# Patient Record
Sex: Male | Born: 1935 | Race: White | Hispanic: No | Marital: Married | Smoking: Former smoker
Health system: Southern US, Community
[De-identification: ages and names within clinical notes are randomized; demographics above are authoritative.]

## PROBLEM LIST (undated history)

## (undated) DIAGNOSIS — I10 Essential (primary) hypertension: Secondary | ICD-10-CM

## (undated) DIAGNOSIS — N1330 Unspecified hydronephrosis: Secondary | ICD-10-CM

## (undated) DIAGNOSIS — N183 Chronic kidney disease, stage 3 unspecified: Secondary | ICD-10-CM

## (undated) DIAGNOSIS — R519 Headache, unspecified: Secondary | ICD-10-CM

## (undated) DIAGNOSIS — N400 Enlarged prostate without lower urinary tract symptoms: Secondary | ICD-10-CM

## (undated) DIAGNOSIS — M1990 Unspecified osteoarthritis, unspecified site: Secondary | ICD-10-CM

## (undated) DIAGNOSIS — Z992 Dependence on renal dialysis: Secondary | ICD-10-CM

## (undated) DIAGNOSIS — R51 Headache: Secondary | ICD-10-CM

## (undated) DIAGNOSIS — I639 Cerebral infarction, unspecified: Secondary | ICD-10-CM

## (undated) DIAGNOSIS — I219 Acute myocardial infarction, unspecified: Secondary | ICD-10-CM

## (undated) DIAGNOSIS — N134 Hydroureter: Secondary | ICD-10-CM

## (undated) DIAGNOSIS — I2510 Atherosclerotic heart disease of native coronary artery without angina pectoris: Secondary | ICD-10-CM

## (undated) DIAGNOSIS — M549 Dorsalgia, unspecified: Secondary | ICD-10-CM

## (undated) DIAGNOSIS — C679 Malignant neoplasm of bladder, unspecified: Secondary | ICD-10-CM

## (undated) DIAGNOSIS — G8929 Other chronic pain: Secondary | ICD-10-CM

## (undated) DIAGNOSIS — N186 End stage renal disease: Secondary | ICD-10-CM

## (undated) DIAGNOSIS — E785 Hyperlipidemia, unspecified: Secondary | ICD-10-CM

## (undated) HISTORY — PX: MULTIPLE TOOTH EXTRACTIONS: SHX2053

## (undated) HISTORY — DX: Hyperlipidemia, unspecified: E78.5

## (undated) HISTORY — PX: TRANSURETHRAL RESECTION OF BLADDER TUMOR: SHX2575

## (undated) HISTORY — DX: Unspecified osteoarthritis, unspecified site: M19.90

## (undated) HISTORY — PX: CHOLECYSTECTOMY: SHX55

## (undated) HISTORY — PX: TONSILLECTOMY: SUR1361

## (undated) HISTORY — DX: Essential (primary) hypertension: I10

---

## 1995-05-26 HISTORY — PX: CORONARY ANGIOPLASTY: SHX604

## 2001-09-26 ENCOUNTER — Emergency Department (HOSPITAL_COMMUNITY): Admission: EM | Admit: 2001-09-26 | Discharge: 2001-09-26 | Payer: Self-pay | Admitting: Emergency Medicine

## 2001-09-26 ENCOUNTER — Encounter: Payer: Self-pay | Admitting: *Deleted

## 2001-10-29 ENCOUNTER — Emergency Department (HOSPITAL_COMMUNITY): Admission: EM | Admit: 2001-10-29 | Discharge: 2001-10-29 | Payer: Self-pay | Admitting: Emergency Medicine

## 2001-11-02 ENCOUNTER — Inpatient Hospital Stay (HOSPITAL_COMMUNITY): Admission: RE | Admit: 2001-11-02 | Discharge: 2001-11-04 | Payer: Self-pay | Admitting: Urology

## 2001-11-08 ENCOUNTER — Emergency Department (HOSPITAL_COMMUNITY): Admission: EM | Admit: 2001-11-08 | Discharge: 2001-11-08 | Payer: Self-pay | Admitting: Emergency Medicine

## 2002-03-10 ENCOUNTER — Ambulatory Visit (HOSPITAL_COMMUNITY): Admission: RE | Admit: 2002-03-10 | Discharge: 2002-03-10 | Payer: Self-pay

## 2002-06-08 ENCOUNTER — Ambulatory Visit (HOSPITAL_COMMUNITY): Admission: RE | Admit: 2002-06-08 | Discharge: 2002-06-08 | Payer: Self-pay | Admitting: Internal Medicine

## 2002-06-08 HISTORY — PX: COLONOSCOPY: SHX174

## 2002-06-08 HISTORY — PX: ESOPHAGOGASTRODUODENOSCOPY: SHX1529

## 2005-04-07 ENCOUNTER — Encounter (INDEPENDENT_AMBULATORY_CARE_PROVIDER_SITE_OTHER): Payer: Self-pay | Admitting: Urology

## 2005-04-07 ENCOUNTER — Inpatient Hospital Stay (HOSPITAL_COMMUNITY): Admission: RE | Admit: 2005-04-07 | Discharge: 2005-04-13 | Payer: Self-pay | Admitting: Urology

## 2005-05-01 ENCOUNTER — Encounter (INDEPENDENT_AMBULATORY_CARE_PROVIDER_SITE_OTHER): Payer: Self-pay | Admitting: Urology

## 2005-05-02 ENCOUNTER — Inpatient Hospital Stay (HOSPITAL_COMMUNITY): Admission: RE | Admit: 2005-05-02 | Discharge: 2005-05-03 | Payer: Self-pay | Admitting: Urology

## 2005-08-14 ENCOUNTER — Encounter (INDEPENDENT_AMBULATORY_CARE_PROVIDER_SITE_OTHER): Payer: Self-pay | Admitting: Specialist

## 2005-08-14 ENCOUNTER — Observation Stay (HOSPITAL_COMMUNITY): Admission: RE | Admit: 2005-08-14 | Discharge: 2005-08-15 | Payer: Self-pay | Admitting: Urology

## 2006-02-24 ENCOUNTER — Ambulatory Visit (HOSPITAL_COMMUNITY): Admission: RE | Admit: 2006-02-24 | Discharge: 2006-02-24 | Payer: Self-pay | Admitting: Urology

## 2006-03-09 ENCOUNTER — Encounter (INDEPENDENT_AMBULATORY_CARE_PROVIDER_SITE_OTHER): Payer: Self-pay | Admitting: *Deleted

## 2006-03-09 ENCOUNTER — Inpatient Hospital Stay (HOSPITAL_COMMUNITY): Admission: RE | Admit: 2006-03-09 | Discharge: 2006-03-10 | Payer: Self-pay | Admitting: Urology

## 2006-03-19 ENCOUNTER — Ambulatory Visit (HOSPITAL_COMMUNITY): Admission: RE | Admit: 2006-03-19 | Discharge: 2006-03-19 | Payer: Self-pay | Admitting: Urology

## 2006-11-29 ENCOUNTER — Ambulatory Visit (HOSPITAL_COMMUNITY): Admission: RE | Admit: 2006-11-29 | Discharge: 2006-11-29 | Payer: Self-pay | Admitting: Urology

## 2007-08-16 ENCOUNTER — Observation Stay (HOSPITAL_COMMUNITY): Admission: RE | Admit: 2007-08-16 | Discharge: 2007-08-17 | Payer: Self-pay | Admitting: Urology

## 2007-08-16 ENCOUNTER — Encounter (INDEPENDENT_AMBULATORY_CARE_PROVIDER_SITE_OTHER): Payer: Self-pay | Admitting: Urology

## 2007-08-17 ENCOUNTER — Emergency Department (HOSPITAL_COMMUNITY): Admission: EM | Admit: 2007-08-17 | Discharge: 2007-08-17 | Payer: Self-pay | Admitting: Emergency Medicine

## 2007-09-12 ENCOUNTER — Ambulatory Visit (HOSPITAL_COMMUNITY): Admission: RE | Admit: 2007-09-12 | Discharge: 2007-09-12 | Payer: Self-pay | Admitting: Family Medicine

## 2007-09-14 ENCOUNTER — Ambulatory Visit (HOSPITAL_COMMUNITY): Admission: RE | Admit: 2007-09-14 | Discharge: 2007-09-14 | Payer: Self-pay | Admitting: Family Medicine

## 2007-09-20 ENCOUNTER — Ambulatory Visit: Payer: Self-pay | Admitting: Cardiovascular Disease

## 2007-09-30 ENCOUNTER — Ambulatory Visit: Payer: Self-pay | Admitting: Internal Medicine

## 2007-09-30 ENCOUNTER — Encounter (HOSPITAL_COMMUNITY): Admission: RE | Admit: 2007-09-30 | Discharge: 2007-10-30 | Payer: Self-pay | Admitting: Cardiovascular Disease

## 2007-09-30 ENCOUNTER — Encounter: Payer: Self-pay | Admitting: Cardiovascular Disease

## 2007-10-06 ENCOUNTER — Ambulatory Visit: Payer: Self-pay | Admitting: Cardiovascular Disease

## 2007-11-15 ENCOUNTER — Ambulatory Visit (HOSPITAL_COMMUNITY): Admission: RE | Admit: 2007-11-15 | Discharge: 2007-11-15 | Payer: Self-pay | Admitting: Family Medicine

## 2008-01-02 ENCOUNTER — Ambulatory Visit: Payer: Self-pay | Admitting: Cardiology

## 2008-01-03 ENCOUNTER — Ambulatory Visit (HOSPITAL_COMMUNITY): Admission: RE | Admit: 2008-01-03 | Discharge: 2008-01-03 | Payer: Self-pay | Admitting: Cardiology

## 2008-06-18 ENCOUNTER — Ambulatory Visit (HOSPITAL_COMMUNITY): Admission: RE | Admit: 2008-06-18 | Discharge: 2008-06-18 | Payer: Self-pay | Admitting: Urology

## 2008-06-22 ENCOUNTER — Ambulatory Visit (HOSPITAL_COMMUNITY): Admission: RE | Admit: 2008-06-22 | Discharge: 2008-06-22 | Payer: Self-pay | Admitting: Urology

## 2008-07-10 ENCOUNTER — Ambulatory Visit (HOSPITAL_COMMUNITY): Admission: RE | Admit: 2008-07-10 | Discharge: 2008-07-10 | Payer: Self-pay | Admitting: Urology

## 2008-07-20 ENCOUNTER — Ambulatory Visit (HOSPITAL_COMMUNITY): Admission: RE | Admit: 2008-07-20 | Discharge: 2008-07-20 | Payer: Self-pay | Admitting: Urology

## 2008-07-21 ENCOUNTER — Emergency Department (HOSPITAL_COMMUNITY): Admission: EM | Admit: 2008-07-21 | Discharge: 2008-07-21 | Payer: Self-pay | Admitting: Emergency Medicine

## 2008-07-27 ENCOUNTER — Ambulatory Visit (HOSPITAL_COMMUNITY): Admission: RE | Admit: 2008-07-27 | Discharge: 2008-07-27 | Payer: Self-pay | Admitting: Urology

## 2008-07-27 ENCOUNTER — Encounter (INDEPENDENT_AMBULATORY_CARE_PROVIDER_SITE_OTHER): Payer: Self-pay | Admitting: Urology

## 2008-10-26 ENCOUNTER — Ambulatory Visit (HOSPITAL_COMMUNITY): Admission: RE | Admit: 2008-10-26 | Discharge: 2008-10-26 | Payer: Self-pay | Admitting: Urology

## 2008-11-12 ENCOUNTER — Ambulatory Visit (HOSPITAL_COMMUNITY): Admission: RE | Admit: 2008-11-12 | Discharge: 2008-11-12 | Payer: Self-pay | Admitting: Urology

## 2009-01-14 ENCOUNTER — Ambulatory Visit: Payer: Self-pay | Admitting: Cardiology

## 2009-01-14 ENCOUNTER — Telehealth (INDEPENDENT_AMBULATORY_CARE_PROVIDER_SITE_OTHER): Payer: Self-pay

## 2009-01-14 ENCOUNTER — Observation Stay (HOSPITAL_COMMUNITY): Admission: EM | Admit: 2009-01-14 | Discharge: 2009-01-15 | Payer: Self-pay | Admitting: Emergency Medicine

## 2009-01-14 ENCOUNTER — Encounter (INDEPENDENT_AMBULATORY_CARE_PROVIDER_SITE_OTHER): Payer: Self-pay

## 2009-01-14 LAB — CONVERTED CEMR LAB
BUN: 26 mg/dL
CK-MB: 1.7 ng/mL
Calcium: 9.5 mg/dL
Chloride: 105 meq/L
Hgb A1c MFr Bld: 7.7 %
MCV: 87.3 fL
Platelets: 230 10*3/uL
Potassium: 4.7 meq/L
Troponin I: <0.05 ng/mL

## 2009-01-15 ENCOUNTER — Encounter (INDEPENDENT_AMBULATORY_CARE_PROVIDER_SITE_OTHER): Payer: Self-pay

## 2009-01-15 LAB — CONVERTED CEMR LAB
AST: 19 units/L
Albumin: 3.6 g/dL
Alkaline Phosphatase: 59 units/L
CO2: 29 meq/L
Chloride: 110 meq/L
Hemoglobin: 12.6 g/dL
LDL Cholesterol: 45 mg/dL
Potassium: 4 meq/L
Sodium: 142 meq/L
Total Protein: 6.8 g/dL
WBC: 6.4 10*3/uL

## 2009-01-24 DIAGNOSIS — I10 Essential (primary) hypertension: Secondary | ICD-10-CM

## 2009-01-24 DIAGNOSIS — E785 Hyperlipidemia, unspecified: Secondary | ICD-10-CM

## 2009-01-24 DIAGNOSIS — M1990 Unspecified osteoarthritis, unspecified site: Secondary | ICD-10-CM | POA: Insufficient documentation

## 2009-01-24 DIAGNOSIS — R0602 Shortness of breath: Secondary | ICD-10-CM | POA: Insufficient documentation

## 2009-01-25 ENCOUNTER — Encounter (INDEPENDENT_AMBULATORY_CARE_PROVIDER_SITE_OTHER): Payer: Self-pay

## 2009-01-25 ENCOUNTER — Ambulatory Visit: Payer: Self-pay | Admitting: Cardiology

## 2009-01-25 DIAGNOSIS — C679 Malignant neoplasm of bladder, unspecified: Secondary | ICD-10-CM

## 2009-01-25 DIAGNOSIS — Z87442 Personal history of urinary calculi: Secondary | ICD-10-CM | POA: Insufficient documentation

## 2009-01-25 DIAGNOSIS — I2510 Atherosclerotic heart disease of native coronary artery without angina pectoris: Secondary | ICD-10-CM | POA: Insufficient documentation

## 2009-01-31 ENCOUNTER — Encounter: Payer: Self-pay | Admitting: Cardiology

## 2009-02-04 ENCOUNTER — Encounter (INDEPENDENT_AMBULATORY_CARE_PROVIDER_SITE_OTHER): Payer: Self-pay | Admitting: *Deleted

## 2009-03-08 ENCOUNTER — Ambulatory Visit (HOSPITAL_COMMUNITY): Admission: RE | Admit: 2009-03-08 | Discharge: 2009-03-08 | Payer: Self-pay | Admitting: Urology

## 2009-03-20 ENCOUNTER — Ambulatory Visit (HOSPITAL_COMMUNITY): Admission: RE | Admit: 2009-03-20 | Discharge: 2009-03-20 | Payer: Self-pay | Admitting: Internal Medicine

## 2009-04-02 ENCOUNTER — Ambulatory Visit (HOSPITAL_COMMUNITY): Admission: RE | Admit: 2009-04-02 | Discharge: 2009-04-02 | Payer: Self-pay | Admitting: Urology

## 2009-04-23 ENCOUNTER — Ambulatory Visit (HOSPITAL_COMMUNITY): Admission: RE | Admit: 2009-04-23 | Discharge: 2009-04-23 | Payer: Self-pay | Admitting: Urology

## 2009-07-08 ENCOUNTER — Ambulatory Visit (HOSPITAL_COMMUNITY): Admission: RE | Admit: 2009-07-08 | Discharge: 2009-07-08 | Payer: Self-pay | Admitting: Urology

## 2009-07-23 ENCOUNTER — Emergency Department (HOSPITAL_COMMUNITY): Admission: EM | Admit: 2009-07-23 | Discharge: 2009-07-23 | Payer: Self-pay | Admitting: Emergency Medicine

## 2009-08-16 ENCOUNTER — Ambulatory Visit (HOSPITAL_COMMUNITY): Admission: RE | Admit: 2009-08-16 | Discharge: 2009-08-16 | Payer: Self-pay | Admitting: Urology

## 2009-08-20 ENCOUNTER — Ambulatory Visit (HOSPITAL_COMMUNITY): Admission: RE | Admit: 2009-08-20 | Discharge: 2009-08-20 | Payer: Self-pay | Admitting: Urology

## 2009-08-27 ENCOUNTER — Inpatient Hospital Stay (HOSPITAL_COMMUNITY): Admission: EM | Admit: 2009-08-27 | Discharge: 2009-08-30 | Payer: Self-pay | Admitting: Emergency Medicine

## 2009-10-01 ENCOUNTER — Inpatient Hospital Stay (HOSPITAL_COMMUNITY): Admission: RE | Admit: 2009-10-01 | Discharge: 2009-10-03 | Payer: Self-pay | Admitting: Urology

## 2009-10-01 ENCOUNTER — Encounter (INDEPENDENT_AMBULATORY_CARE_PROVIDER_SITE_OTHER): Payer: Self-pay | Admitting: Urology

## 2009-10-08 ENCOUNTER — Encounter (INDEPENDENT_AMBULATORY_CARE_PROVIDER_SITE_OTHER): Payer: Self-pay | Admitting: *Deleted

## 2009-11-26 ENCOUNTER — Ambulatory Visit (HOSPITAL_COMMUNITY): Admission: RE | Admit: 2009-11-26 | Discharge: 2009-11-26 | Payer: Self-pay | Admitting: Urology

## 2010-01-02 ENCOUNTER — Ambulatory Visit (HOSPITAL_COMMUNITY): Admission: RE | Admit: 2010-01-02 | Discharge: 2010-01-02 | Payer: Self-pay | Admitting: Urology

## 2010-02-26 ENCOUNTER — Encounter (INDEPENDENT_AMBULATORY_CARE_PROVIDER_SITE_OTHER): Payer: Self-pay | Admitting: *Deleted

## 2010-06-15 ENCOUNTER — Encounter: Payer: Self-pay | Admitting: Internal Medicine

## 2010-06-15 ENCOUNTER — Encounter: Payer: Self-pay | Admitting: Diagnostic Radiology

## 2010-06-24 NOTE — Letter (Signed)
Summary: Appointment - Reminder 2  [facility] [place]. [address], [place] [address]   Phone: [phone]  Fax: [phone]     February 26, 2010 MRN: [id number]   Craig Castillo [address] [place], [place]  [address]   Dear [name],  Our records indicate that it is time to schedule a follow-up appointment.  Dr.  Dietrich Pates        recommended that you follow up with [name] in    07/2009        . It is very important that we reach you to schedule this appointment. We look forward to participating in your health care needs. Please contact us at the number listed above at your earliest convenience to schedule your appointment.  If you are unable to make an appointment at this time, give [name] a call so we can update our records.     Sincerely,   Glass blower/designer

## 2010-06-24 NOTE — Letter (Signed)
Summary: Appointment - Reminder 2  [facility] [place]. [address] [place] [address]   Phone: [phone]  Fax: [phone]     Oct 08, 2009 MRN: [id number]   Craig Castillo [address] [place], [place]  [address]   Dear [name],  Our records indicate that it is time to schedule a follow-up appointment.  Dr.   Dietrich Pates       recommended that you follow up with [name] in      07/2009      . It is very important that we reach you to schedule this appointment. We look forward to participating in your health care needs. Please contact us at the number listed above at your earliest convenience to schedule your appointment.  If you are unable to make an appointment at this time, give [name] a call so we can update our records.     Sincerely,   Glass blower/designer

## 2010-07-08 ENCOUNTER — Other Ambulatory Visit (HOSPITAL_COMMUNITY): Payer: Self-pay | Admitting: Urology

## 2010-07-08 DIAGNOSIS — N200 Calculus of kidney: Secondary | ICD-10-CM

## 2010-07-11 ENCOUNTER — Ambulatory Visit (HOSPITAL_COMMUNITY)
Admission: RE | Admit: 2010-07-11 | Discharge: 2010-07-11 | Disposition: A | Discharge status: Home / Self Care | Payer: PRIVATE HEALTH INSURANCE | Source: Ambulatory Visit | Attending: Urology | Admitting: Urology

## 2010-07-11 DIAGNOSIS — N200 Calculus of kidney: Secondary | ICD-10-CM

## 2010-07-11 DIAGNOSIS — R109 Unspecified abdominal pain: Secondary | ICD-10-CM | POA: Insufficient documentation

## 2010-07-11 DIAGNOSIS — N1330 Unspecified hydronephrosis: Secondary | ICD-10-CM | POA: Insufficient documentation

## 2010-08-12 LAB — COMPREHENSIVE METABOLIC PANEL
ALT: 10 U/L (ref 0–53)
AST: 14 U/L (ref 0–37)
Calcium: 8.8 mg/dL (ref 8.4–10.5)
Creatinine, Ser: 2.42 mg/dL — ABNORMAL HIGH (ref 0.4–1.5)
GFR calc Af Amer: 32 mL/min — ABNORMAL LOW (ref >60)
Sodium: 138 mEq/L (ref 135–145)
Total Protein: 6.6 g/dL (ref 6.0–8.3)

## 2010-08-12 LAB — RENAL FUNCTION PANEL
Albumin: 3 g/dL — ABNORMAL LOW (ref 3.5–5.2)
Calcium: 9 mg/dL (ref 8.4–10.5)
GFR calc Af Amer: 25 mL/min — ABNORMAL LOW (ref >60)
GFR calc non Af Amer: 21 mL/min — ABNORMAL LOW (ref >60)
Glucose, Bld: 227 mg/dL — ABNORMAL HIGH (ref 70–99)
Phosphorus: 2.6 mg/dL (ref 2.3–4.6)
Potassium: 3.8 mEq/L (ref 3.5–5.1)
Sodium: 137 mEq/L (ref 135–145)

## 2010-08-12 LAB — BASIC METABOLIC PANEL
BUN: 31 mg/dL — ABNORMAL HIGH (ref 6–23)
BUN: 37 mg/dL — ABNORMAL HIGH (ref 6–23)
CO2: 23 mEq/L (ref 19–32)
CO2: 26 mEq/L (ref 19–32)
Calcium: 9.1 mg/dL (ref 8.4–10.5)
Calcium: 9.5 mg/dL (ref 8.4–10.5)
Chloride: 107 mEq/L (ref 96–112)
Creatinine, Ser: 2.96 mg/dL — ABNORMAL HIGH (ref 0.4–1.5)
GFR calc Af Amer: 25 mL/min — ABNORMAL LOW (ref >60)
GFR calc Af Amer: 35 mL/min — ABNORMAL LOW (ref >60)
GFR calc non Af Amer: 29 mL/min — ABNORMAL LOW (ref >60)
GFR calc non Af Amer: 29 mL/min — ABNORMAL LOW (ref >60)
Glucose, Bld: 184 mg/dL — ABNORMAL HIGH (ref 70–99)
Glucose, Bld: 221 mg/dL — ABNORMAL HIGH (ref 70–99)
Potassium: 3.9 mEq/L (ref 3.5–5.1)
Potassium: 4.8 mEq/L (ref 3.5–5.1)
Sodium: 138 mEq/L (ref 135–145)

## 2010-08-12 LAB — GLUCOSE, CAPILLARY
Glucose-Capillary: 183 mg/dL — ABNORMAL HIGH (ref 70–99)
Glucose-Capillary: 208 mg/dL — ABNORMAL HIGH (ref 70–99)
Glucose-Capillary: 213 mg/dL — ABNORMAL HIGH (ref 70–99)
Glucose-Capillary: 216 mg/dL — ABNORMAL HIGH (ref 70–99)
Glucose-Capillary: 230 mg/dL — ABNORMAL HIGH (ref 70–99)
Glucose-Capillary: 245 mg/dL — ABNORMAL HIGH (ref 70–99)

## 2010-08-12 LAB — URINE CULTURE

## 2010-08-12 LAB — DIFFERENTIAL
Basophils Absolute: 0 10*3/uL (ref 0.0–0.1)
Basophils Absolute: 0 10*3/uL (ref 0.0–0.1)
Basophils Relative: 0 % (ref 0–1)
Eosinophils Absolute: 0.2 10*3/uL (ref 0.0–0.7)
Eosinophils Absolute: 0.2 10*3/uL (ref 0.0–0.7)
Eosinophils Relative: 2 % (ref 0–5)
Eosinophils Relative: 2 % (ref 0–5)
Lymphocytes Relative: 7 % — ABNORMAL LOW (ref 12–46)
Lymphocytes Relative: 9 % — ABNORMAL LOW (ref 12–46)
Lymphs Abs: 0.5 10*3/uL — ABNORMAL LOW (ref 0.7–4.0)
Lymphs Abs: 0.9 10*3/uL (ref 0.7–4.0)
Monocytes Absolute: 0 10*3/uL — ABNORMAL LOW (ref 0.1–1.0)
Monocytes Relative: 0 % — ABNORMAL LOW (ref 3–12)
Monocytes Relative: 8 % (ref 3–12)
Monocytes Relative: 9 % (ref 3–12)
Neutro Abs: 6.7 10*3/uL (ref 1.7–7.7)
Neutro Abs: 8.4 10*3/uL — ABNORMAL HIGH (ref 1.7–7.7)
Neutrophils Relative %: 80 % — ABNORMAL HIGH (ref 43–77)

## 2010-08-12 LAB — CARDIAC PANEL(CRET KIN+CKTOT+MB+TROPI)
CK, MB: 1 ng/mL (ref 0.3–4.0)
Relative Index: INVALID (ref 0.0–2.5)
Total CK: 30 U/L (ref 7–232)
Troponin I: 0.02 ng/mL (ref 0.00–0.06)
Troponin I: 0.03 ng/mL (ref 0.00–0.06)

## 2010-08-12 LAB — CBC
HCT: 37.8 % — ABNORMAL LOW (ref 39.0–52.0)
Hemoglobin: 13.2 g/dL (ref 13.0–17.0)
MCHC: 35.1 g/dL (ref 30.0–36.0)
MCHC: 35.7 g/dL (ref 30.0–36.0)
MCV: 85.1 fL (ref 78.0–100.0)
Platelets: 225 10*3/uL (ref 150–400)
Platelets: 236 10*3/uL (ref 150–400)
RBC: 3.73 MIL/uL — ABNORMAL LOW (ref 4.22–5.81)
RBC: 4.4 MIL/uL (ref 4.22–5.81)
RDW: 15.1 % (ref 11.5–15.5)
RDW: 15.2 % (ref 11.5–15.5)
RDW: 15.2 % (ref 11.5–15.5)

## 2010-08-12 LAB — MRSA PCR SCREENING: MRSA by PCR: NEGATIVE

## 2010-08-12 LAB — CULTURE, BLOOD (ROUTINE X 2)
Culture: NO GROWTH
Report Status: 5152011

## 2010-08-13 LAB — BASIC METABOLIC PANEL
BUN: 40 mg/dL — ABNORMAL HIGH (ref 6–23)
BUN: 40 mg/dL — ABNORMAL HIGH (ref 6–23)
CO2: 20 mEq/L (ref 19–32)
CO2: 20 mEq/L (ref 19–32)
Calcium: 8.7 mg/dL (ref 8.4–10.5)
Calcium: 9 mg/dL (ref 8.4–10.5)
Chloride: 102 mEq/L (ref 96–112)
Creatinine, Ser: 3.03 mg/dL — ABNORMAL HIGH (ref 0.4–1.5)
GFR calc Af Amer: 29 mL/min — ABNORMAL LOW (ref >60)
GFR calc non Af Amer: 20 mL/min — ABNORMAL LOW (ref >60)
Glucose, Bld: 172 mg/dL — ABNORMAL HIGH (ref 70–99)
Glucose, Bld: 231 mg/dL — ABNORMAL HIGH (ref 70–99)
Potassium: 3.8 mEq/L (ref 3.5–5.1)
Potassium: 4.1 mEq/L (ref 3.5–5.1)
Sodium: 130 mEq/L — ABNORMAL LOW (ref 135–145)
Sodium: 142 mEq/L (ref 135–145)

## 2010-08-13 LAB — DIFFERENTIAL
Basophils Absolute: 0 10*3/uL (ref 0.0–0.1)
Eosinophils Absolute: 0.1 10*3/uL (ref 0.0–0.7)
Eosinophils Relative: 1 % (ref 0–5)
Eosinophils Relative: 2 % (ref 0–5)
Lymphocytes Relative: 10 % — ABNORMAL LOW (ref 12–46)
Lymphocytes Relative: 15 % (ref 12–46)
Lymphocytes Relative: 7 % — ABNORMAL LOW (ref 12–46)
Lymphs Abs: 0.8 10*3/uL (ref 0.7–4.0)
Lymphs Abs: 1 10*3/uL (ref 0.7–4.0)
Monocytes Absolute: 0.7 10*3/uL (ref 0.1–1.0)
Monocytes Absolute: 0.8 10*3/uL (ref 0.1–1.0)
Monocytes Relative: 11 % (ref 3–12)
Neutro Abs: 4.5 10*3/uL (ref 1.7–7.7)

## 2010-08-13 LAB — URINALYSIS, ROUTINE W REFLEX MICROSCOPIC
Bilirubin Urine: NEGATIVE
Glucose, UA: NEGATIVE mg/dL
Ketones, ur: NEGATIVE mg/dL
Nitrite: POSITIVE — AB
Specific Gravity, Urine: 1.025 (ref 1.005–1.030)
pH: 5.5 (ref 5.0–8.0)

## 2010-08-13 LAB — CULTURE, BLOOD (ROUTINE X 2)
Culture: NO GROWTH
Report Status: 4102011

## 2010-08-13 LAB — CBC
HCT: 30.5 % — ABNORMAL LOW (ref 39.0–52.0)
HCT: 32.9 % — ABNORMAL LOW (ref 39.0–52.0)
Hemoglobin: 10.8 g/dL — ABNORMAL LOW (ref 13.0–17.0)
Hemoglobin: 11.4 g/dL — ABNORMAL LOW (ref 13.0–17.0)
MCV: 86.4 fL (ref 78.0–100.0)
Platelets: 166 10*3/uL (ref 150–400)
RBC: 3.57 MIL/uL — ABNORMAL LOW (ref 4.22–5.81)
RDW: 13.8 % (ref 11.5–15.5)
RDW: 14.1 % (ref 11.5–15.5)
WBC: 11 10*3/uL — ABNORMAL HIGH (ref 4.0–10.5)
WBC: 6.6 10*3/uL (ref 4.0–10.5)

## 2010-08-13 LAB — HEMOGLOBIN A1C: Hgb A1c MFr Bld: 8.1 % — ABNORMAL HIGH (ref 4.6–6.1)

## 2010-08-13 LAB — VITAMIN B12: Vitamin B-12: 160 pg/mL — ABNORMAL LOW (ref 211–911)

## 2010-08-13 LAB — RENAL FUNCTION PANEL
CO2: 18 mEq/L — ABNORMAL LOW (ref 19–32)
Chloride: 114 mEq/L — ABNORMAL HIGH (ref 96–112)
Creatinine, Ser: 2.85 mg/dL — ABNORMAL HIGH (ref 0.4–1.5)
GFR calc Af Amer: 26 mL/min — ABNORMAL LOW (ref >60)
GFR calc non Af Amer: 22 mL/min — ABNORMAL LOW (ref >60)
Sodium: 140 mEq/L (ref 135–145)

## 2010-08-13 LAB — GLUCOSE, CAPILLARY
Glucose-Capillary: 135 mg/dL — ABNORMAL HIGH (ref 70–99)
Glucose-Capillary: 136 mg/dL — ABNORMAL HIGH (ref 70–99)
Glucose-Capillary: 143 mg/dL — ABNORMAL HIGH (ref 70–99)
Glucose-Capillary: 156 mg/dL — ABNORMAL HIGH (ref 70–99)
Glucose-Capillary: 170 mg/dL — ABNORMAL HIGH (ref 70–99)
Glucose-Capillary: 172 mg/dL — ABNORMAL HIGH (ref 70–99)
Glucose-Capillary: 245 mg/dL — ABNORMAL HIGH (ref 70–99)

## 2010-08-13 LAB — FOLATE: Folate: 6 ng/mL

## 2010-08-13 LAB — IRON AND TIBC
Saturation Ratios: 13 % — ABNORMAL LOW (ref 20–55)
UIBC: 181 ug/dL

## 2010-08-13 LAB — URINE CULTURE

## 2010-08-13 LAB — URINE MICROSCOPIC-ADD ON

## 2010-08-13 LAB — RETICULOCYTES: Retic Count, Absolute: 22 10*3/uL (ref 19.0–186.0)

## 2010-08-17 LAB — DIFFERENTIAL
Basophils Absolute: 0 10*3/uL (ref 0.0–0.1)
Eosinophils Relative: 4 % (ref 0–5)
Lymphocytes Relative: 17 % (ref 12–46)
Lymphs Abs: 1.3 10*3/uL (ref 0.7–4.0)
Neutrophils Relative %: 72 % (ref 43–77)

## 2010-08-17 LAB — URINE CULTURE: Colony Count: >=100000

## 2010-08-17 LAB — URINALYSIS, ROUTINE W REFLEX MICROSCOPIC
Glucose, UA: 100 mg/dL — AB
Ketones, ur: NEGATIVE mg/dL
Protein, ur: 100 mg/dL — AB
Urobilinogen, UA: 0.2 mg/dL (ref 0.0–1.0)

## 2010-08-17 LAB — BASIC METABOLIC PANEL
BUN: 28 mg/dL — ABNORMAL HIGH (ref 6–23)
Calcium: 9 mg/dL (ref 8.4–10.5)
Creatinine, Ser: 2.06 mg/dL — ABNORMAL HIGH (ref 0.4–1.5)
GFR calc non Af Amer: 32 mL/min — ABNORMAL LOW (ref >60)
Glucose, Bld: 229 mg/dL — ABNORMAL HIGH (ref 70–99)

## 2010-08-17 LAB — CBC
HCT: 37.5 % — ABNORMAL LOW (ref 39.0–52.0)
Platelets: 246 10*3/uL (ref 150–400)
RDW: 13.9 % (ref 11.5–15.5)
WBC: 7.9 10*3/uL (ref 4.0–10.5)

## 2010-08-17 LAB — URINE MICROSCOPIC-ADD ON

## 2010-08-27 LAB — CBC
Hemoglobin: 12.5 g/dL — ABNORMAL LOW (ref 13.0–17.0)
MCHC: 34.8 g/dL (ref 30.0–36.0)
RBC: 4.09 MIL/uL — ABNORMAL LOW (ref 4.22–5.81)
WBC: 7.5 10*3/uL (ref 4.0–10.5)

## 2010-08-27 LAB — BASIC METABOLIC PANEL
CO2: 24 mEq/L (ref 19–32)
Calcium: 9.4 mg/dL (ref 8.4–10.5)
Chloride: 106 mEq/L (ref 96–112)
Creatinine, Ser: 2.02 mg/dL — ABNORMAL HIGH (ref 0.4–1.5)
GFR calc Af Amer: 39 mL/min — ABNORMAL LOW (ref >60)
Sodium: 141 mEq/L (ref 135–145)

## 2010-08-27 LAB — GLUCOSE, CAPILLARY: Glucose-Capillary: 147 mg/dL — ABNORMAL HIGH (ref 70–99)

## 2010-08-30 LAB — CBC
HCT: 35.8 % — ABNORMAL LOW (ref 39.0–52.0)
Hemoglobin: 12.6 g/dL — ABNORMAL LOW (ref 13.0–17.0)
MCHC: 35.2 g/dL (ref 30.0–36.0)
MCV: 87.3 fL (ref 78.0–100.0)
MCV: 87.7 fL (ref 78.0–100.0)
RBC: 4.09 MIL/uL — ABNORMAL LOW (ref 4.22–5.81)
RBC: 4.1 MIL/uL — ABNORMAL LOW (ref 4.22–5.81)
RDW: 14.3 % (ref 11.5–15.5)
WBC: 6.7 10*3/uL (ref 4.0–10.5)

## 2010-08-30 LAB — COMPREHENSIVE METABOLIC PANEL
Albumin: 3.6 g/dL (ref 3.5–5.2)
BUN: 22 mg/dL (ref 6–23)
Calcium: 9.4 mg/dL (ref 8.4–10.5)
Chloride: 110 mEq/L (ref 96–112)
Creatinine, Ser: 1.5 mg/dL (ref 0.4–1.5)
GFR calc non Af Amer: 46 mL/min — ABNORMAL LOW (ref >60)
Total Bilirubin: 0.2 mg/dL — ABNORMAL LOW (ref 0.3–1.2)

## 2010-08-30 LAB — LIPID PANEL
Cholesterol: 100 mg/dL (ref 0–200)
HDL: 20 mg/dL — ABNORMAL LOW (ref >39)
Triglycerides: 174 mg/dL — ABNORMAL HIGH (ref <150)

## 2010-08-30 LAB — CK
Total CK: 135 U/L (ref 7–232)
Total CK: 152 U/L (ref 7–232)

## 2010-08-30 LAB — BASIC METABOLIC PANEL
Chloride: 105 mEq/L (ref 96–112)
GFR calc non Af Amer: 38 mL/min — ABNORMAL LOW (ref >60)
Potassium: 4.7 mEq/L (ref 3.5–5.1)
Sodium: 141 mEq/L (ref 135–145)

## 2010-08-30 LAB — DIFFERENTIAL
Basophils Absolute: 0 10*3/uL (ref 0.0–0.1)
Eosinophils Absolute: 0.3 10*3/uL (ref 0.0–0.7)
Eosinophils Relative: 4 % (ref 0–5)
Lymphocytes Relative: 18 % (ref 12–46)
Lymphocytes Relative: 24 % (ref 12–46)
Lymphs Abs: 1.2 10*3/uL (ref 0.7–4.0)
Monocytes Absolute: 0.5 10*3/uL (ref 0.1–1.0)
Monocytes Absolute: 0.5 10*3/uL (ref 0.1–1.0)
Monocytes Relative: 7 % (ref 3–12)
Neutro Abs: 4 10*3/uL (ref 1.7–7.7)

## 2010-08-30 LAB — GLUCOSE, CAPILLARY: Glucose-Capillary: 142 mg/dL — ABNORMAL HIGH (ref 70–99)

## 2010-08-30 LAB — TSH: TSH: 1.39 u[IU]/mL (ref 0.350–4.500)

## 2010-08-30 LAB — POCT CARDIAC MARKERS
CKMB, poc: 1.7 ng/mL (ref 1.0–8.0)
Myoglobin, poc: 136 ng/mL (ref 12–200)
Troponin i, poc: <0.05 ng/mL (ref 0.00–0.09)
Troponin i, poc: <0.05 ng/mL (ref 0.00–0.09)

## 2010-08-30 LAB — BRAIN NATRIURETIC PEPTIDE: Pro B Natriuretic peptide (BNP): <30 pg/mL (ref 0.0–100.0)

## 2010-08-30 LAB — T4, FREE: Free T4: 0.79 ng/dL — ABNORMAL LOW (ref 0.80–1.80)

## 2010-09-04 LAB — BASIC METABOLIC PANEL
BUN: 19 mg/dL (ref 6–23)
CO2: 30 mEq/L (ref 19–32)
Calcium: 9.4 mg/dL (ref 8.4–10.5)
GFR calc non Af Amer: 55 mL/min — ABNORMAL LOW (ref >60)
Glucose, Bld: 172 mg/dL — ABNORMAL HIGH (ref 70–99)
Potassium: 4.2 mEq/L (ref 3.5–5.1)
Sodium: 140 mEq/L (ref 135–145)

## 2010-09-04 LAB — GLUCOSE, CAPILLARY: Glucose-Capillary: 176 mg/dL — ABNORMAL HIGH (ref 70–99)

## 2010-09-09 LAB — BASIC METABOLIC PANEL
BUN: 13 mg/dL (ref 6–23)
Calcium: 9.4 mg/dL (ref 8.4–10.5)
Chloride: 104 mEq/L (ref 96–112)
GFR calc Af Amer: >60 mL/min (ref >60)
GFR calc non Af Amer: 53 mL/min — ABNORMAL LOW (ref >60)
GFR calc non Af Amer: 56 mL/min — ABNORMAL LOW (ref >60)
Glucose, Bld: 98 mg/dL (ref 70–99)
Potassium: 3.9 mEq/L (ref 3.5–5.1)
Potassium: 3.9 mEq/L (ref 3.5–5.1)
Sodium: 141 mEq/L (ref 135–145)
Sodium: 142 mEq/L (ref 135–145)

## 2010-09-09 LAB — PROTIME-INR: INR: 0.9 (ref 0.00–1.49)

## 2010-09-09 LAB — CBC
HCT: 39.8 % (ref 39.0–52.0)
HCT: 41.3 % (ref 39.0–52.0)
Hemoglobin: 13.9 g/dL (ref 13.0–17.0)
Hemoglobin: 14.3 g/dL (ref 13.0–17.0)
MCV: 89.1 fL (ref 78.0–100.0)
Platelets: 188 10*3/uL (ref 150–400)
RBC: 4.64 MIL/uL (ref 4.22–5.81)
RDW: 13.5 % (ref 11.5–15.5)
WBC: 6.5 10*3/uL (ref 4.0–10.5)
WBC: 9.5 10*3/uL (ref 4.0–10.5)

## 2010-09-09 LAB — GLUCOSE, CAPILLARY: Glucose-Capillary: 101 mg/dL — ABNORMAL HIGH (ref 70–99)

## 2010-09-09 LAB — APTT: aPTT: 25 seconds (ref 24–37)

## 2010-10-07 NOTE — Op Note (Signed)
NAME:  Craig Castillo, Craig Castillo NO.:  [id number]   MEDICAL RECORD NO.:  [id number]          PATIENT TYPE:  AMB   LOCATION:  DAY                           FACILITY:  [facility]   PHYSICIAN:  Ky Barban, M.D.DATE OF BIRTH:  August 26, 1932   DATE OF PROCEDURE:  DATE OF DISCHARGE:                               OPERATIVE REPORT   PREOPERATIVE DIAGNOSIS:  Right ureteral stones.   POSTOPERATIVE DIAGNOSIS:  Right ureteral stones.   PROCEDURE:  Cystoscopic right retrograde pyelogram, ureteroscopic stone  basket extraction with holmium laser lithotripsy, insertion of double-J  stent size 7-French and no string attached.   ANESTHESIA:  Spinal plus biopsy of the right ureteral orifice.   PROCEDURE:  The patient was given spinal anesthesia, placed in lithotomy  position.  I want to mention here that this patient tried to do a stone  basket before, but I could not find his right ureteral orifice, because  he has a lot of scar tissue there, that is the site where he had  previously bladder tumor which I have resected.  There is no recurrence  grossly of any bladder tumor, but I could not see the orifice, so we had  to do a percutaneous nephrostomy and inserted an antegrade double-J  stent.  So today, this patient is here.  He already has a double-J stent  on the right side.  So, I am going to go ahead and put a guidewire  through the double-J stent.  So,  #25 cystoscope was introduced into the  bladder.  The stent was visualized, grabbed with the help of a foreign  body forceps and brought to the tip of the urethral meatus.  Now, the  guidewire was passed, but unfortunately the guidewire does not go beyond  the orifice, because that is the area where the stent is kinking, so I  decided to handle it differently.  So I got a short rigid ureteroscope  introduced to the level of the ureteral orifice and under direct vision,  I was able to advance a guidewire alongside the ureteral stent and  under  fluoroscopic control, I went up into the renal pelvis.  Then, I removed  the double-J stent.  The guidewire was in place and I introduced a short  rigid ureteroscope alongside the guidewire.  There are 7 or 8 stones  stacked up in the distal ureter starting from the orifice and the  leading stones were broken with the help of holmium laser.  Then, under  direct vision, several pieces were basketed, but still some pieces were  larger, I had to do holmium laser lithotripsy again, some of these  pieces, eventually all the pieces which I could remove I have removed  and I had removed all the pieces which were grossly visible.  The  ureteral orifice, I dilated this with a balloon also during the  procedure to get the stone pieces out easier, but it really did not make  much difference.  So I decided to use laser so I can do a meatotomy with  the laser which I did.  There  was still a lot of tissue hanging right at  the orifice, and I took full flexible biopsy forceps through the  cystoscope, I biopsied the ureteral orifice.  I have inspected the  ureter, looks okay.  The guidewire was changed, put a new guidewire  through open-end catheter and then I put #7 ureteral stent, 24 cm long  under fluoroscopic control and the string has been  removed, stent is nicely sitting.  After removing the guidewire, it  coiled up in the renal pelvis and the bladder.  The nephrostomy tube is  still in place, so at this point, the patient is doing well.  I am going  to discharge him home and remove his nephrostomy tube in the office on  Monday.      Ky Barban, M.D.  Electronically Signed     MIJ/MEDQ  D:  07/27/2008  T:  07/28/2008  Job:  [id number]

## 2010-10-07 NOTE — Consult Note (Signed)
NAME:  Craig Castillo, Craig Castillo NO.:  [id number]   MEDICAL RECORD NO.:  [id number]          PATIENT TYPE:  AMB   LOCATION:  DAY                           FACILITY:  [facility]   PHYSICIAN:  Ky Barban, M.D.DATE OF BIRTH:  [birth year]   DATE OF CONSULTATION:  DATE OF DISCHARGE:                                 CONSULTATION   [name] is 76 year old gentleman, is well known to me because for  several years I am following him for the bladder cancer and he has no  recurrence of his bladder cancer.  He is completely asymptomatic, but he  started to have gross hematuria.  Workup showed there is no recurrence  of the tumor but CT scan shows that he has stones in both lower ureters  and he has marked right hydroureteronephrosis secondary to distal  ureteral calculi.  He has several stones in the distal right ureter and  also a stone in the distal left ureter, which is now causing severe  obstruction.  There is a mass in the left kidney and which appeared to  be hyperdense.  MRI was recommended.  MRI was done.  It showed that this  mass is hemorrhagic cyst and not adenocarcinoma, but this patient has a  distal ureteral calculi on both sides.  I have advised him that we need  to get them out and explained him the procedure limitation  complications.  I will after the right ureteral stones first, and he  will need a double-J stent on that side.  I may have to use laser to  break the stones, and if everything goes fine, I may go after the left  ureteral calculus also, but I did not promise that I will go over them  simultaneously, but I want to try to do that and I want to mention there  are 7 stones, largest one is 6.4 mm in the distal right ureter.  The  stone on the distal left ureter is 6.1 mm.  I have discussed this in  detail with the patient and his wife, possible complication of a  ureteral stone basket was discussed, which may lead to open surgery if  there is ureteral  perforation.  They understand and want me to go ahead  and proceed.  The patient does have diabetes for which he takes insulin.   PHYSICAL EXAMINATION:  VITAL SIGNS:  Well-nourished, well-developed  male, not in acute distress.  Blood pressure is 130/80, temperature is  normal.  CENTRAL NERVOUS SYSTEM:  No gross neurological deficit.  HEAD, NECK, EYE, AND ENT:  Negative.  CHEST:  Symmetrical.  HEART:  Regular sinus rhythm.  No murmur.  ABDOMEN:  Soft, flat.  Liver, spleen, kidneys are not palpable.  No CVA  tenderness.  EXTERNAL GENITALIA:  Circumcised.  Meatus adequate.  Testicles are  normal.  RECTAL:  Deferred.   IMPRESSION:  1. Bilateral ureteral calculi and cystoscopy, bilateral retrograde      pyelogram, possible bilateral ureteral stone      extraction, and possible holmium laser lithotripsy, possible double-      J  stent.  2. History of bladder cancer status post transurethral resection      bladder tumor and bilateral cystogram.  3. Diabetes, insulin dependent.      Ky Barban, M.D.  Electronically Signed     MIJ/MEDQ  D:  07/09/2008  T:  07/10/2008  Job:  [id number]

## 2010-10-07 NOTE — Assessment & Plan Note (Signed)
[facility]                       [place] [facility] OFFICE NOTE   Craig Castillo, Craig Castillo                     MRN:          [id number]  DATE:10/06/2007                            DOB:          1933-05-14    Ariv returns today for follow-up.   He was initially seen for dyspnea.  The patient has had asthmatic  bronchitis; this seems to be improved.  He had a stress Myoview with  dobutamine which was nonischemic and low risk.   The EF was estimated at 52%.  However, by echocardiogram the EF was  estimated at 40-45% with diffuse hypokinesis.  I told Nehan that there  was not any evidence of critical coronary disease.  He may have a bit of  cardiomyopathy.  He does not appear to have pulmonary hypertension.  He  has been relatively stable.  He is a diabetic and still caring a little  extra fluid in his ankle, so we will switch in the Lasix 20 a day and  stop his hydrochlorothiazide.  He will have a follow-up BMET and BNP in  8 weeks, and follow-up with [facility] in 3 months.   We will use [facility] first to put his prescription into the [facility]  here in town.   REVIEW OF SYSTEMS:  Otherwise remarkable for no significant chest pain;  no PND, orthopnea.  Mild lower extremity edema.   MEDICATIONS:  1. Lisinopril 40 mg daily.  2. Metoprolol 50 mg daily.  3. Simvastatin 40 mg daily.  4. Amlodipine 10 mg daily.  5. Novolin 30 b.i.d.  6. Gemfibrozil 600 mg daily.  7. Hydrochlorothiazide to be stopped.  8. Lasix 20 mg daily; to be started.  9. Terazosin 2 mg daily.  10.Aspirin daily.   PHYSICAL EXAMINATION:  Remarkable for an overweight white male in no  distress.  Weight is 256, blood pressure 140/78, pulse 62 and regular,  afebrile.  HEENT:  Unremarkable.  Carotids are normal and without bruit.  No  lymphadenopathy, thyromegaly, JVP elevation.  LUNGS:  Clear.  Good diaphragmatic motion.  No wheezing,  HEART:  S1 and S2 with  normal heart sounds.  PMI normal.  ABDOMEN:  Protuberant.  Bowel sounds positive.  No AAA and no  tenderness.  No hepatosplenomegaly. EXTREMITIES:  Normal reflexes,  distal pulses are intact.  No edema.  NEUROLOGIC:  Nonfocal.  SKIN:  Warm and dry.  No muscular weakness.   I reviewed his Myoview study and it was low-risk.  His baseline EKG is  normal.   IMPRESSION:  Dyspnea.  More related to asthmatic bronchitis and lung  disease.  Normal EF by Myoview, mildly decreased by echocardiogram.  Continue risk factor modification and control of blood pressure.  Switch  hydrochlorothiazide to Lasix 20 mg a day.  Follow-up BNP and BMET in 8  weeks.  1. Hypertension.  Currently well-controlled.  Continue lisinopril,      amlodipine and beta-blocker.  A low-salt diet.  2. Diabetes.  Avoid Actos.  Continue Novolin.  Hemoglobin A1c      quarterly.  Hydrochlorothiazide to be stopped for a stronger  diuretic that interferes with glycemic control less.  3. Prostatism.  Continue terazosin 2 mg daily.  Urinary stream is      good.  PSA in 6 months.  4. Hypercholesterolemia.  Continue simvastatin 40 mg a day.  Lipid and      liver profile in 6 months.  5. Lower Extremity Edema.  He needs more of a diuretic.  Continue low-      salt diet.  Lasix 20 mg a day to be substituted for      hydrochlorothiazide.     Noralyn Pick. Eden Emms, MD, [facility]  Electronically Signed    PCN/MedQ  DD: 10/06/2007  DT: 10/06/2007  Job #: [id number]

## 2010-10-07 NOTE — Letter (Signed)
January 02, 2008    Jonell Cluck, MD  [address]  [place], [place] [address]   RE:  Craig Castillo, Craig Castillo  MRN:  [id number]  /  DOB:  August 28, 1932   Dear Loraine Leriche,   Craig Castillo returns to the office for continued assessment and  treatment of dyspnea with a history of coronary artery disease.  He  previously was followed by Dr. Eden Emms, but now has been transferred to  my practice.  He has felt better in recent weeks, but the warm weather  has caused an exacerbation of his exertional dyspnea and wheezing.  He  has had a normal BNP level, a normal CT scan of the chest, a normal  stress Myoview study, and an echocardiogram showing mild diffuse  hypokinesis.  Blood pressure control has been fairly good on multiple  antihypertensives.   CURRENT MEDICATIONS:  1. Lisinopril 40 mg daily.  2. Metoprolol 50 mg daily.  3. Simvastatin 40 mg daily.  4. Amlodipine 10 mg daily.  5. Gemfibrozil 600 mg daily.  6. Insulin 30 units q.a.m. and 15 units q.p.m.  7. Flomax 0.4 mg daily.  8. Furosemide 20 mg daily.  9. Aspirin 325 mg daily.   PHYSICAL EXAMINATION:  GENERAL:  Pleasant overweight gentleman, in no  acute distress.  VITAL SIGNS:  The weight is 252, 4 pounds less than in May.  Blood  pressure 150/75, heart rate 70 and regular, and respirations 18.  NECK:  No jugular venous distention; normal carotid upstrokes without  bruits.  LUNGS:  Normal inspirations; good expiration with minimal and expiratory  rhonchi.  CARDIAC:  Normal first and second heart sounds; modest systolic ejection  murmur.  ABDOMEN:  Soft and nontender; no organomegaly.  EXTREMITIES:  Ankle edema 1/2+.   IMPRESSION:  Craig Castillo has improved without any significant change in  his medications.  The modification of his diuretic regime is probably  not significant.  We will proceed with a set of pulmonary function test  to further define his pulmonary disorder, if any.  His dose of  metoprolol will be decreased in  case this is causing any bronchospasm.  He probably will need additional antihypertensive therapy, he may wish  to try clonidine or Cardura.  I will plan to see this nice gentleman  again in 4 months.    Sincerely,      Gerrit Friends. Dietrich Pates, MD, [facility]  Electronically Signed    RMR/MedQ  DD: 01/02/2008  DT: 01/03/2008  Job #: [id number]

## 2010-10-07 NOTE — Consult Note (Signed)
NAME:  Craig Castillo, Craig Castillo NO.:  [id number]   MEDICAL RECORD NO.:  [id number]          PATIENT TYPE:  OBV   LOCATION:  A302                          FACILITY:  [facility]   PHYSICIAN:  Gerrit Friends. Dietrich Pates, MD, FACCDATE OF BIRTH:  08-03-1932   DATE OF CONSULTATION:  01/15/2009  DATE OF DISCHARGE:  01/15/2009                                 CONSULTATION   REFERRING PHYSICIAN:  Renee Ramus, MD   PRIMARY CARE PHYSICIAN:  [name], MD   PRIMARY CARDIOLOGIST:  Gerrit Friends. Dietrich Pates, MD, [facility]   HISTORY OF PRESENT ILLNESS:  A 77 year old gentleman admitted to  hospital with chest discomfort.  Craig Castillo has a history of coronary  disease, having undergone multiple stent implantations in the right  coronary artery and a complex procedure in 1997.  Since then, he has  intermittently complained of chest discomfort and/or dyspnea and  undergone noninvasive workup, most recently in mid 2009 at which time  his echocardiogram showed mild diffuse left ventricular dysfunction and  a stress nuclear study showed normal myocardial perfusion.  He has  generally been doing well, but noted the acute onset of left chest  discomfort with paresthesias in the left arm early in the morning of  admission.  He took 3 nitroglycerin over the course of 45 minutes,  ultimately with relief of his discomfort.  There was no associated  dyspnea, diaphoresis, nor nausea.  The intensity was mild to moderate.  He found that he could move his left arm in certain positions with some  relief of discomfort.  He has had no orthopedic problems in his cervical  spine or left shoulder previously.  He subsequently noted episodic  return of his symptoms over the course of the day at a decreased  intensity.  He did not subsequently take nitroglycerin.  He presented to  the emergency department for further evaluation and was admitted.   ALLERGIES:  NIACIN, CELEBREX, HYDROCODONE, MORPHINE, and PEANUTS.   MEDICATIONS  ON ADMISSION:  1. Gemfibrozil 600 mg b.i.d.  2. Sublingual nitroglycerin p.r.n.  3. Simvastatin 40 mg daily.  4. Amlodipine 10 mg daily.  5. Novolin R 20 units b.i.d.  6. Flomax 0.4 mg daily.  7. HCTZ 25 mg daily.  8. Metoprolol 50 mg b.i.d.  9. Lisinopril 40 mg daily.  10.Omeprazole 20 mg daily.   PAST MEDICAL HISTORY:  Notable for hypertension, diabetes with treatment  including insulin, excessive weight, multiple recent cystographic  procedures for carcinoma of the bladder supplemented by BCG,  hyperlipidemia, BPH, and a recent episode of nephrolithiasis.   PAST SURGICAL HISTORY:  Tonsillectomy, cholecystectomy, placement of  right ureteral stent in February 2010.   SOCIAL HISTORY:  Remote history of cigarette smoking, but none in the  past for decades.  No excessive use of alcohol.  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   FAMILY HISTORY:  No prominent coronary disease.   REVIEW OF SYSTEMS:  History of hiatal hernia and GERD, controlled with  low-dose PPI; follows a diabetic diet at home.  He has impaired vision  and uses corrective  lenses for near and far; he has impaired hearing  with a hearing aid on the left.  All other systems reviewed and are  negative.   PHYSICAL EXAMINATION:  GENERAL:  On exam, pleasant, well-appearing  gentleman.  VITAL SIGNS:  The temperature is 97.9, heart rate is 65 and regular,  respirations 20, blood pressure 155/75 off his usual antihypertensive  agent.  O2 saturation 98% on room air.  Weight 109 kg, height 68 inches.  HEENT:  EOMs full; pupils equal, round, and reactive to light; normal  lids and conjunctivae; normal oral mucosa.  NECK:  No jugular venous distention; normal carotid upstrokes without  bruits.  ENDOCRINE:  No thyromegaly.  HEMATOPOIETIC:  No adenopathy.  SKIN:  No significant lesions.  PSYCHIATRIC:  Alert and oriented; normal affect.  LUNGS:  Clear.  CARDIAC:  Normal first and second  heart sounds.  ABDOMEN:  Soft and nontender; no masses; no organomegaly.  EXTREMITIES:  No edema; normal distal pulses.  NEUROLOGIC:  Symmetric strength and tone; normal cranial nerves.  MUSCULOSKELETAL:  Full range of motion in neck and shoulder; no pain to  palpation or movement.   EKG:  Normal sinus rhythm; incomplete right-bundle branch block; low  voltage in the limb leads; otherwise unremarkable.  No change when  compared to a previous tracing of August 12, 2007.   Other laboratory notable for minimal anemia with hemoglobin of 12.6 and  a normal MCV.  Glucose values have been 138 and 196.  BUN and creatinine  were initially high at 22 and 1.77, but have improved to 22 and 1.5.  Cardiac markers are negative.  LFTs are normal.  BNP is normal.   CHEST X-RAY:  Slight cardiomegaly; low lung volumes; no acute process.   IMPRESSION:  Mr. Kling chest discomfort is worrisome in terms of  its location and radiation; however, his exam, EKG, and cardiac markers  are reassuring.  We will check lipid profile, resume most of his  medications, and proceed with a stress test.  His diuretic will be  discontinued for now due to his slight renal impairment.  A non  steroidal will be added to his medical regime to treat a possible  orthopedic origin for his symptoms.  Plain films of the C-spine and left  shoulder will be obtained.  He may need a follow up CT or MRI of his  cervical spine.  We appreciate the request for consultation and will be  happy to assist with the care of this nice gentleman.     Gerrit Friends. Dietrich Pates, MD, [facility]  Electronically Signed    RMR/MEDQ  D:  01/15/2009  T:  01/16/2009  Job:  [id number]

## 2010-10-07 NOTE — H&P (Signed)
NAME:  Craig Castillo, Craig Castillo NO.:  [id number]   MEDICAL RECORD NO.:  [id number]          PATIENT TYPE:  AMB   LOCATION:  DAY                           FACILITY:  [facility]   PHYSICIAN:  Ky Barban, M.D.DATE OF BIRTH:  1932/06/23   DATE OF ADMISSION:  08/15/2007  DATE OF DISCHARGE:  LH                              HISTORY & PHYSICAL   CHIEF COMPLAINT:  Bladder cancer.   HISTORY OF PRESENT ILLNESS:  A 77 year old gentleman who is known to  have a low-grade carcinoma of the bladder. He has undergone BCG  treatment with excellent results. There is no recurrence in the past but  last cystoscopy a couple of months ago showed that there is a recurrence  of the papillary growth, so he is being brought in as an outpatient to  undergo TUR of bladder tumor. Will be kept overnight in the hospital.  The patient is aware of the procedure. He had it done a few times in the  past. He is aware of the complications. He is not having any other  voiding difficulties.   PAST MEDICAL HISTORY:  History of bladder tumor. Had TUR of bladder  tumor followed by BCG therapy.   SOCIAL HISTORY:  He does not smoke or drink.   REVIEW OF SYSTEMS:  Otherwise unremarkable.   PHYSICAL EXAMINATION:  GENERAL:  A well developed, well nourished male.  Not in acute distress.  VITAL SIGNS:  Blood pressure 140/80, temperature is normal.  CENTRAL NERVOUS SYSTEM:  Negative.  HEENT:  Negative.  CHEST:  __________.  HEART:  Regular sinus rhythm. No murmur.  ABDOMEN:  Soft, flat. Liver, spleen and kidneys not palpable.  EXTREMITIES:  Anteriorly is unremarkable.  RECTAL:  Prostate 1.5, smooth, and firm.   IMPRESSION:  1. Recurrent bladder cancer.  2. Hypertension.  3. Diabetes, insulin dependent.  4. Coronary artery disease.   PLAN:  TUR bladder tumor. Will admit him in the hospital for observation  overnight.      Ky Barban, M.D.  Electronically Signed     MIJ/MEDQ  D:  08/15/2007   T:  08/15/2007  Job:  [id number]   cc:   [name], M.D.  Fax: [phone]

## 2010-10-07 NOTE — Consult Note (Signed)
NAME:  Craig Castillo, Craig Castillo NO.:  [id number]   MEDICAL RECORD NO.:  [id number]          PATIENT TYPE:  AMB   LOCATION:  DAY                           FACILITY:  [facility]   PHYSICIAN:  Ky Barban, M.D.DATE OF BIRTH:  [birth year]   DATE OF CONSULTATION:  07/26/2008  DATE OF DISCHARGE:                                 CONSULTATION   Craig Castillo is a gentleman.  He is a well-known patient of mine.  He is  a 77 year old gentleman who had bladder cancer which I have treated with  TURBT and BCG and fortunately has no recurrence.  He has done well over  the last couple of years.  He is also diabetic, has stones in his  kidneys, and has developed bilateral hydroureteronephrosis.  He had a  stone in the distal left ureter and also in the distal right ureter  having bilateral hydronephrosis.  I went after the stone in the left  ureter but it went up into the kidney.  The stone in the right ureter I  could not find the orifice because that is where his tumor was  originally and because of the scar tissue in that area I just could not  see the orifice so we have to go ahead and do a percutaneous nephrostomy  and then he has a double-J stent on that side.  I am bringing him back  to go back and do right ureteral stone basket because I should be able  to get into the orifice now.  He has a double-J stent on that side.  I  have explained these things thoroughly to the patient and his wife and  he still has nephrostomy tube in place and I will go and remove the  stone ureteroscopically and I may have to use laser.  I may have to  leave the stent in after removing the stones.   PAST MEDICAL HISTORY:  1. He has insulin-dependent diabetes.  2. Also, he has BPH for which he takes Flomax with very good results.  3. Also, history of having coronary artery disease and he had a      coronary stent placed in 1997.  On a p.r.n. basis, he takes      nitroglycerin.   FAMILY HISTORY:   Negative.   PERSONAL HISTORY:  Does not smoke or drink.   REVIEW OF SYSTEMS:  Unremarkable.   EXAMINATION:  Moderately obese, not in acute distress, fully conscious,  alert, oriented.  Blood pressure 130/80.  Temperature is normal.  CENTRAL NERVOUS SYSTEM:  Negative.  HEAD:  Negative.  NECK:  Negative.  ENT:  Negative.  CHEST:  Symmetrical.  HEART:  Regular sinus rhythm.  No murmur.  ABDOMEN:  Soft, flat.  Liver, spleen, and kidneys are note palpable.  Has nephrostomy tube in the right flank.  EXTERNAL GENITALIA:  Unremarkable.  RECTAL EXAM:  Deferred.  EXTREMITIES:  Normal.   IMPRESSION:  1. Distal right ureteral calculi.  2. Diabetes mellitus.  3. Hypertension.  4. Coronary artery disease.  5. Cystoscopy.  6. Right retrograde pyelogram.  7. Ureteroscopic stone  basket extraction.  8. Holmium laser lithotripsy.  9. Use of double-J stent under anesthesia as outpatient.      Ky Barban, M.D.  Electronically Signed     MIJ/MEDQ  D:  07/26/2008  T:  07/26/2008  Job:  [id number]

## 2010-10-07 NOTE — H&P (Signed)
NAME:  Craig Castillo, YONO NO.:  [id number]   MEDICAL RECORD NO.:  [id number]          PATIENT TYPE:  OBV   LOCATION:  A302                          FACILITY:  [facility]   PHYSICIAN:  Renee Ramus, MD       DATE OF BIRTH:  04-11-1933   DATE OF ADMISSION:  01/14/2009  DATE OF DISCHARGE:  LH                              HISTORY & PHYSICAL   PRIMARY CARE PHYSICIAN:  [name], MD   HISTORY OF PRESENT ILLNESS:  The patient is a 77 year old male who  experienced substernal chest pain with radiation to his left arm for the  past 2-3 weeks prior to admission.  The patient reports the pain  recently occurred at midnight.  He had no precipitating factors.  He  stated that it lasted for several hours and was relieved by  nitroglycerin.  The patient denies having this pain in the past.  Denies  fevers, chills, night sweats, nausea, vomiting, shortness of breath,  PND, or orthopnea.  The patient does have a history of dyspnea and did  have a Myoview done approximately 1 year prior which was negative.  The  patient at that time also had echocardiogram that showed diffuse mild  wall motion abnormalities with an estimated EF of 40-45%.  The patient  now being admitted for further evaluation and treatment of atypical  chest pain.  Of note, the patient did have 3 stents placed in 1997 for  advanced coronary artery disease.   PAST MEDICAL HISTORY:  1. Diabetes mellitus type 1.  2. Obesity.  3. History of bladder cancer status post BCG treatment.  4. Hypertension.  5. Coronary artery disease status post stent placement in 1997.  6. Hypercholesterolemia.  7. Benign prostatic hypertrophy.  8. History of kidney stones.   SOCIAL HISTORY:  The patient smoked, but has not had tobacco in 40  years.  Denies alcohol use.   FAMILY HISTORY:  Not available.   REVIEW OF SYSTEMS:  All other comprehensive review systems are negative.   ALLERGIES:  The patient has allergies to NIACIN,  CELEBREX, and MORPHINE.   CURRENT MEDICATIONS:  1. Gemfibrozil 600 mg p.o. b.i.d.  2. Nitroglycerin 0.4 mg sublingual p.r.n. chest pain, may repeat up to      3 times and call MD.  3. Simvastatin 40 mg p.o. daily.  4. Amlodipine 10 mg p.o. daily.  5. Novolin R 20 units subcu b.i.d.  6. Flomax 4 mg p.o. daily.  7. Hydrochlorothiazide 25 mg p.o. daily.  8. Metoprolol 50 mg p.o. b.i.d.  9. Lisinopril 40 mg p.o. daily.  10.Omeprazole 20 mg p.o. daily.   PHYSICAL EXAMINATION:  GENERAL:  This is a well-developed, well-  nourished white male, currently in no apparent distress.  VITAL SIGNS:  Blood pressure 134/65, heart rate 63, respiratory rate 20,  and temperature 97.4.  HEENT:  No jugular venous distention.  No lymphadenopathy.  Oropharynx  is clear.  Mucous membranes pink and moist.  TMs clear bilaterally.  Pupils equal, reactive to light and accommodation.  Extraocular muscles  are intact.  CARDIOVASCULAR:  Regular rate  and rhythm without murmurs, rubs, or  gallops.  PULMONARY:  Lungs are clear to auscultation bilaterally.  ABDOMEN:  Soft, obese, nontender, and nondistended without  hepatosplenomegaly.  Bowel sounds are present.  He has no rebound or  guarding.  EXTREMITIES:  She has no clubbing, cyanosis, but he does have +1 to +2  lower extremity edema.  NEURO:  Cranial nerves II through XII are grossly intact.  He has no  focal neurological deficits.   STUDIES:  1. Chest x-ray shows cardiomegaly, but no signs of volume overload.  2. EKG shows first-degree AV block with a repolarization abnormality,      but no signs of acute ischemia or infarction.   LABORATORY DATA:  White count 6.7, H&H 12.6 and 35.8, MCV 67, and  platelets 230.  Sodium 141, potassium 4.7, chloride 105, bicarb 20, BUN  28, creatinine 1.7, and glucose 196.   ASSESSMENT AND PLAN:  1. Chest pain.  The patient has strong underlying risk factors for      unstable angina, although the presentation is  certainly atypical      with the pain lasting for several hours.  The patient will be      followed with serial enzymes.  We are going to prepare him for      stress.  He will be seen by Cardiology in a.m., if necessary he      will be transferred to [facility] for cardiac cath.  2. Diabetes mellitus type 1.  We will place the patient on sliding      scale insulin and Lantus.  3. Acute renal failure and chronic kidney disease stage I.  We would      consider IV fluid.  The patient does have lower extremity edema and      evidence of mild volume overload, so we will just hold his      diuretics at this point and reassess in a.m.  4. Hypertension, currently stable.  5. Hypercholesterolemia.  Continue statin and check a lipid panel.  6. Obesity.  We will check TSH and free T4.  7. Benign prostatic hypertrophy, currently stable.  We will continue      Flomax.  8. Disposition.  Again, transfer to [facility] will be dependent upon      Cardiology's recommendations and the outcome of the stress test.   H&P was constructed by reviewing past medical history, confirming with  emergency medical room physician, reviewing the emergency medical  record.   TIME SPENT:  One hour.      Renee Ramus, MD  Electronically Signed     JF/MEDQ  D:  01/14/2009  T:  01/15/2009  Job:  [id number]   cc:   Gerrit Friends. Dietrich Pates, MD, [facility]  [address]  [place], [place] [address]   [name], M.D.  Fax: [phone]

## 2010-10-07 NOTE — Op Note (Signed)
NAME:  Craig Castillo, Craig Castillo NO.:  [id number]   MEDICAL RECORD NO.:  [id number]          PATIENT TYPE:  AMB   LOCATION:  DAY                           FACILITY:  [facility]   PHYSICIAN:  Ky Barban, M.D.DATE OF BIRTH:  28-Jul-1932   DATE OF PROCEDURE:  08/16/2007  DATE OF DISCHARGE:                               OPERATIVE REPORT   PREOPERATIVE DIAGNOSIS:  Bladder cancer.   POSTOPERATIVE DIAGNOSIS:  Bladder cancer.   PROCEDURE PERFORMED:  Transurethral resection of bladder tumor, large.   ANESTHESIA:  Spinal.   PROCEDURE:  The patient received spinal anesthesia. After usual prep and  drape, the #28 Iglesias resectoscope was introduced into the bladder.  It was inspected. The tumor was located right along the dome of the  bladder, extremely difficult to get at. With the help of the camera, I  was able to resect the tumor. Then, using a flexible cystoscope, the  tumor area was fulgurated with the Bugbee electrode and if he has re-  growth, I think the best thing will be to do an open surgery to remove  that part of the bladder.  It is extremely dangerous to keep doing this,  he could get perforated, so far he was lucky but it is very difficult to  keep it in focus with the resectoscope.  Anyway, the specimen was  removed with the [facility] evacuator.  A #20 Foley catheter was left in for  drainage.  The patient left the operating room in satisfactory  condition.      Ky Barban, M.D.  Electronically Signed     MIJ/MEDQ  D:  08/16/2007  T:  08/16/2007  Job:  [id number]

## 2010-10-07 NOTE — Assessment & Plan Note (Signed)
[facility]                       [place] [facility] OFFICE NOTE   Craig Castillo, Craig Castillo                     MRN:          [id number]  DATE:09/20/2007                            DOB:          12-27-1932    REFERRING PHYSICIAN:  [name], M.D.   REFERRING PHYSICIAN:  [name], M.D.   REASON FOR REFERRAL:  Dyspnea, wheezing, rule out atypical cardiac  presentation.   The patient has a history of coronary disease.  He has previously had 3  overlapping stents placed to the right coronary artery I believe in 1997  by Dr. Primitivo Gauze.   The patient has not had close followup since that time.   His last Myoview test was way back in 2000.  He had septal hypokinesis  with no infarct, no ischemia, and a normal EF.   The patient has had dyspnea and wheezing for about 6 months.  She has  been treated with ProAir and nebulizers as well as Avelox and Depo-  Medrol.   He had a chest x-ray done September 12, 2007 at Dr. Geanie Logan office.  I do  not have these results.   His note indicated a long overdue followup regarding his cardiac status.   In talking to [name], his dyspnea has been progressive over 6 months.  He  notices wheezing.  It can occur at rest or with exertion.  This is  nothing like his previous angina.  He had significant chest pain and  angina prior to his stents to the right coronary artery.   The patient used to work in [employer].  He has a very distant history  of asbestosis exposure.  He is a nonsmoker.  There has been no cough or  sputum production.  He has not had any significant fevers.   I would agree with Dr. Nobie Putnam that it is a little unusual to have  refractory asthma at his age for the first time, now lasting 6 months.   REVIEW OF SYSTEMS:  His review of systems is otherwise negative.   PAST MEDICAL HISTORY:  1. New onset asthma.  2. History of coronary disease with previous stent to the right      coronary  artery in 1997.  3. Diabetes.  4. Hypertension.  5. Hyperlipidemia.  6. Osteoarthritis.   PAST SURGICAL HISTORY:  The patient is status post cholecystectomy.   ALLERGIES:  HE DENIES ANY ALLERGIES.   CURRENT MEDICATIONS:  1. Lisinopril 40 a day.  2. Metoprolol 50 a day.  3. Simvastatin 40 a day.  4. Amlodipine 10 a day.  5. Insulin 30 units b.i.d.  6. Gemfibrozil 600 a day.  7. Hydrochlorothiazide 25 a day.  8. Terazosin 2 mg a day.  9. An aspirin a day.   FAMILY HISTORY:  Noncontributory.   SOCIAL HISTORY:  The patient is retired.  He lives with his wife.  He  has one older son.  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  He does not smoke or drink.  The patient used to  work in [employer], and that is when he  had some asbestosis exposure.   PHYSICAL EXAMINATION:  GENERAL:  An overweight white male.  He is  currently not wheezing.  VITAL SIGNS:  His weight is 252 which is up about 10 pounds from his  exam in 2002.  Blood pressure 136/80, pulse 64 and regular, respiratory  rate 16, afebrile.  HEENT:  Unremarkable.  NECK:  Carotids normal without bruit.  No lymphadenopathy, thyromegaly,  or JVP elevation.  LUNGS:  Clear.  No active wheezing.  Good diaphragmatic motion.  CARDIOVASCULAR:  S1-S2 with normal heart sounds.  PMI normal.  ABDOMEN:  Protuberant, status post cholecystectomy.  No bruit.  No  murmur.  No hepatosplenomegaly or hepatojugular reflux.  No AAA.  EXTREMITIES:  Distal pulses are intact with trace edema bilaterally.  PTs +2.  NEUROLOGIC:  Nonfocal.  SKIN:  Warm and dry.  No muscular weakness.   His EKG shows sinus rhythm with an incomplete bundle branch block and is  otherwise normal.   IMPRESSION:  1. Dyspnea.  Check 2-D echocardiogram to rule out right ventricular or      left ventricular dysfunction.  The patient has been treated      aggressively for asthmatic bronchitis.  This is bad allergy season.      It might be  worthwhile to add Singulair or Claritin to his regimen.      We will do a chest CT to rule out pulmonary embolism or      interstitial lung disease, although his lung exam sounds benign at      this time.  2. History of coronary disease.  I do not think his asthma is an      anginal equivalent.  He clearly had chest pain with his previous      blockage.  He will continue his aspirin therapy.  He will have an      adenosine Myoview study.  I do not think this will make him      terribly wheezy.  I ambulated him in the hall today, and I do not      think he can negotiate a treadmill.  3. Hypertension, currently well-controlled.  Continue low-salt diet      and lisinopril.  4. Hypercholesterolemia in the setting of known coronary disease.      Continue simvastatin 40 a day.  Lipid and liver profile in 6      months.  5. Diabetes.  Continue insulin therapy.  Follow up with Dr. Nobie Putnam.      Hemoglobin A1c quarterly.   Further recommendations will be based on results of the patient's CT,  echocardiogram, and stress test.     Theron Arista C. Eden Emms, MD, [facility]  Electronically Signed    PCN/MedQ  DD: 09/20/2007  DT: 09/20/2007  Job #: [id number]

## 2010-10-07 NOTE — Op Note (Signed)
NAME:  STEEL, KERNEY NO.:  [id number]   MEDICAL RECORD NO.:  [id number]          PATIENT TYPE:  AMB   LOCATION:  DAY                           FACILITY:  [facility]   PHYSICIAN:  Ky Barban, M.D.DATE OF BIRTH:  [birth year]   DATE OF PROCEDURE:  DATE OF DISCHARGE:                               OPERATIVE REPORT   PREOPERATIVE DIAGNOSIS:  Bilateral ureteral calculi.   POSTOPERATIVE DIAGNOSIS:  Bilateral ureteral calculi.   PROCEDURE:  Cystoscopy, left retrograde pyelogram, left ureteroscopy and  pyeloscopy attempt, left renal stone basketing attempt failed.   ANESTHESIA:  General.   PROCEDURE:  The patient is given general endotracheal anesthesia, placed  in lithotomy position.  After usual prep and drape, #25 cystoscope was  introduced into the bladder was inspected, it is inspected.  This is the  patient who had previous TUR bladder tumor and BCG.  He has a lot of  scar tissue on the area of the trigone.  The right ureteral orifice  which where I was going to go, I could never find it.  The left ureteral  orifice was found and a retrograde pyelogram was performed.  Ureter is  markedly dilated on the left side also, but there was a filling defect  in the ureterovesical junction and I can see it move up into the dilated  ureter, and I had to dilate the intramural ureter to get into the  ureter.  Short rigid ureteroscope was introduced over the guidewire.  There is no stone.  It probably moved up into the renal pelvis because  the ureter was dilated.  So, I introduced a flexible ureteroscope into  the renal pelvis.  I can see the stone is in the lower pole calix.  I  tried to basket it, but I could not do it with the [facility] basket, and we  could not locate nitinol basket, so I decided to quit the procedure.  I  attempt considerable time trying to find the orifice on the right side  which I could never find.  I have dilated the left ureteral orifice,  hopefully  stone will come out and I think in order to get the stones out  from the distal right ureter, we may have to go antegrade and put a  guidewire from the antegrade approach.  All the instruments and  guidewires were removed.  The patient left the operating room in  satisfactory condition.      Ky Barban, M.D.  Electronically Signed     MIJ/MEDQ  D:  07/10/2008  T:  07/10/2008  Job:  [id number]

## 2010-10-07 NOTE — Discharge Summary (Signed)
NAME:  Craig Castillo, Craig Castillo              ACCOUNT NO.:  [id number]   MEDICAL RECORD NO.:  [id number]          PATIENT TYPE:  OBV   LOCATION:  A302                          FACILITY:  [facility]   PHYSICIAN:  Melissa L. Ladona Ridgel, MD  DATE OF BIRTH:  [birth year]   DATE OF ADMISSION:  01/14/2009  DATE OF DISCHARGE:  08/24/2010LH                               DISCHARGE SUMMARY   DIAGNOSES AT THE TIME OF DISCHARGE:  1. Chest pain.  Ruled out for acute myocardial infarction using serial      cardiac markers and stress testing, initially stress exercise;      subsequently nuclear study was obtained.  The patient had increased      chest tightness with exertion which was worrisome but different      from his presenting symptom.  Dr. Dietrich Pates feels that the patient      is safe for discharge to home to follow up in the outpatient office      and titration of his home medication.  2. Left shoulder pain secondary to arthritis.  The patient will be      permitted to have 7 days of Naprosyn treatments.  I have instructed      him if he develops any worsening abdominal pain or any blood in his      stools or urine that he is to contact Dr. Nobie Putnam and stop taking      the medication.  The patient is to remain reasonably hydrated.  3. Diabetes.  The patient will continue with his home dose of Novolin      20 units subcutaneous twice daily.  4. Hyperlipidemia.  The patient will continue on Zocor and      gemfibrozil.  5. Hypertension.  The patient will resume his Norvasc and lisinopril.  6. Gastroesophageal reflux disease.  The patient will continue with      his reflux medication, omeprazole 20 mg once daily.   PERTINENT MEDICATIONS:  1. Naprosyn 375 mg t.i.d. x7 days, then discontinue.  2. Norvasc 10 mg daily.  3. Lisinopril 40 mg daily.  4. Omeprazole 20 mg daily.  5. Nitroglycerin 0.4 mg as needed for chest pain.  6. Gemfibrozil 600 mg twice daily.  7. Zocor 40 mg at bedtime.  8. Novolin R 20 units  subcutaneously twice daily.   HOSPITAL COURSE:  The patient is a pleasant 77 year old white male with  a known past medical history for obesity, diabetes, hypertension,  coronary artery disease, status post stent placement in LAD,  hypercholesterolemia.  The patient presented to the emergency room with  a report of several episodes of substernal chest pain radiating to the  left arm.  He states the pain woke him from sleep and had no  precipitating factor.  He states it lasted for several hours and was  relieved by nitroglycerin.  He had no fever, chills, night sweats.  The  patient has known abnormalities of his coronary arteries and has an EF  of 40% to 45% noted 1 year ago.  The patient was admitted to the  telemetry floor.  He underwent  telemetry monitoring.  He was treated  with his home medications and was started on Lovenox 40 mg subcu q.24 h.  He was kept NPO and underwent exercise stress testing.  The patient  developed chest tightness during his stress test, and therefore he was  converted to a nuclear study.  According to Dr. Marvel Plan verbal  summary to me, the patient had symptoms during the stress test that were  worrisome but different from his chief complaint upon arrival;  therefore, he feels that this is shoulder related and not heart related.  The patient will be instructed to follow up with Dr. Dietrich Pates in 1 week  to determine optimizing his cardiac regimen.   PHYSICAL EXAMINATION:  GENERAL:  At the time of discharge, the patient  is clinically stable.  VITAL SIGNS:  Temperature is 98.2, blood pressure 136/70, pulse 69,  respirations 20, saturation 97%.  GENERAL:  This is a moderately obese white male sitting in a chair by  the window in no acute distress.  HEENT:  He is normocephalic, atraumatic.  Pupils equal, round and  reactive to light.  Extraocular muscles are intact.  Mucous membranes  are moist.  NECK:  Supple.  There is no JVD.  I do not appreciate any   lymphadenopathy.  CHEST:  Decreased but clear to auscultation.  There are no rhonchi,  rales or wheezes.  CARDIOVASCULAR:  Regular rate and rhythm, positive S1 and S2.  I do not  appreciate a murmur, rub, or gallop.  ABDOMEN:  Obese but soft, nontender, nondistended with positive bowel  sounds.  EXTREMITIES:  Show some mottling, and he has trace edema, but I do  appreciate reasonable pulses.  NEUROLOGIC:  Cranial nerves II-XII appear to be intact.  Power appears  to be 5/5.  Gait is stable.  PSYCHIATRIC:  Affect is appropriate.  Recent and remote memory intact.  Judgment and insight are intact.   HOSPITAL COURSE:  As stated, the patient was admitted to the telemetry  floor, treated with aspirin, Lovenox, and his blood pressure medications  as well as diabetic medications.  He was ruled out using serial cardiac  markers and underwent exercise stress testing with Dr. Dietrich Pates.  The  patient evidently developed chest pressure and increased shortness of  breath during the exercise portion of the test.  He therefore was  converted to a nuclear study, which was interpreted as showing no  obvious ischemia at this time.  The patient therefore will discharge to  the care of Dr. Dietrich Pates to request a visit in approximately 1 week.  Will resume his home medications and add Naprosyn 375 one p.o. q.8 h for  the noted arthritis in his shoulder.   Pertinent laboratories during the course of this hospital stay reveal  negative cardiac markers.  His cholesterol is 100, triglyceride 174, HDL  20, LDL 45.  His BNP was less than 30.   Radiological studies include a cervical spine which shows normal  alignment of the cervical bodies, but moderate to advanced degenerative  changes are noted with the disk.  There are spurring changes and  foraminal narrowing at C5, C6, C7, to T1.  The shoulder x-rays show  maintenance of the joint spaces; however, there do not appear to be any  significant degenerative  changes in the shoulder itself.  Chest x-ray  shows no obvious acute cardiopulmonary process.  He has borderline  cardiomegaly.   At this time, the patient is deemed stable for discharge to follow  up  with Dr. Dietrich Pates as an outpatient in the next 1 week.  As stated, we  will resume his medications and trial a brief course of NSAIDs.  The  patient should follow up with Dr. Nobie Putnam in the next 30 days and call  if he develops any significant complications related to the Naprosyn,  which were described to him as being blood in his stools, urine or  increasing abdominal pain.   At this time, disposition is home.  Condition stable.  Diet is 1800 ADA  low-fat diet.   Total time on this discharge is less than 30 minutes.      Melissa L. Ladona Ridgel, MD  Electronically Signed     MLT/MEDQ  D:  01/15/2009  T:  01/15/2009  Job:  [id number]

## 2010-10-10 NOTE — H&P (Signed)
NAME:  Craig Castillo, Craig Castillo NO.:  [id number]   MEDICAL RECORD NO.:  [id number]          PATIENT TYPE:  AMB   LOCATION:  DAY                           FACILITY:  [facility]   PHYSICIAN:  Ky Barban, M.D.DATE OF BIRTH:  Oct 15, 1932   DATE OF ADMISSION:  DATE OF DISCHARGE:  LH                                HISTORY & PHYSICAL   CHIEF COMPLAINT:  Recurrent carcinoma of bladder.   HISTORY:  A 77 year old gentleman has bladder carcinoma. He underwent TUR of  bladder tumor in December last year. He has a low-grade to moderate-grade  carcinoma, noninvasive. Followup cystoscopy shows recurrent carcinoma. He  just finished his BCG treatment also. He is brought in as outpatient to  undergo TUR of bladder tumor. The procedure, its complications discussed.  They understand and want to proceed.   PAST HISTORY:  He has had this carcinoma for several years. Lost to follow  up. He comes back, he has recurrence. He also has insulin-dependent diabetes  and hypertension.   FAMILY HISTORY:  No history of prostate cancer.   PERSONAL HISTORY:  Does not smoke or drink.   REVIEW OF SYSTEMS:  Unremarkable.   PHYSICAL EXAMINATION:  GENERAL:  Moderately obese, not in acute distress.  VITAL SIGNS:  Blood pressure 140/80, temperature is normal.  CENTRAL NERVOUS SYSTEM:  Negative.  HEENT:  Negative.  CHEST:  Symmetrical.  HEART:  Regular sinus rhythm.  ABDOMEN:  Soft, flat. Liver, spleen and kidneys are not palpable. No CVA  tenderness.  EXTERNAL GENITALIA:  Unremarkable.  RECTAL:  Deferred.  EXTREMITIES:  Normal.   IMPRESSION:  1.  Transitional cell carcinoma of the bladder, noninvasive, recurrent.  2.  Coronary artery disease.  3.  Insulin-dependent diabetes.  4.  Hypertension.   PLAN:  TUR of bladder tumor, then admit him in the hospital.      Ky Barban, M.D.  Electronically Signed     MIJ/MEDQ  D:  08/13/2005  T:  08/13/2005  Job:  [id number]

## 2010-10-10 NOTE — H&P (Signed)
NAME:  Craig Castillo, Craig Castillo NO.:  [id number]   MEDICAL RECORD NO.:  [id number]          PATIENT TYPE:  AMB   LOCATION:  DAY                           FACILITY:  [facility]   PHYSICIAN:  Ky Barban, M.D.DATE OF BIRTH:  1933-03-20   DATE OF ADMISSION:  DATE OF DISCHARGE:  LH                                HISTORY & PHYSICAL   CHIEF COMPLAINT:  Recurrent bladder tumor.   A 77 year old gentleman who has recurrent bladder tumor which is  superficial, has been treated with BCG and recently had a cystoscopy done  which showed the recurrence of the tumor, and also CT scan was done that  shows bilateral hydroureteronephrosis down to the bladder and the bladder  appeared intact, so I suspect that his tumor has become infiltrative.  Other  possibility, it could be from his BCG and I am going to go ahead and resect  this recurrent tumor and also do multiple biopsies under anesthesia as an  outpatient, but he will be kept overnight in the hospital.   PAST HISTORY:  1. Insulin-dependent diabetes.  2. Hypertension.   FAMILY HISTORY:  Negative.   PERSONAL HISTORY:  He does not smoke or drink.   REVIEW OF SYSTEMS:  He denies any chest pain, orthopnea, PND, nausea,  vomiting.   EXAMINATION:  VITAL SIGNS:  Blood pressure 130/80, temperature is normal.  CENTRAL NERVOUS SYSTEM:  Negative.  HEENT:  Negative.  CHEST:  Symmetrical.  HEART:  Regular sinus rhythm, no murmur.  ABDOMEN:  Soft, flat.  Liver, spleen, kidneys are not palpable.  No CVA  tenderness.  GENITOURINARY:  External genitalia:  Circumcised meatus, adequate.  Testicles are normal.  RECTAL:  Normal sphincter tone, no rectal mass.  Prostate 1.5+, smooth and  firm.   IMPRESSION:  1. Recurrent bladder tumor.  2. Insulin-dependent diabetes.  3. Hypertension.   PLAN:  Cystoscopy, TUR bladder tumor, multiple biopsies of the bladder under  anesthesia.      Ky Barban, M.D.  Electronically  Signed     MIJ/MEDQ  D:  03/08/2006  T:  03/08/2006  Job:  [id number]

## 2010-10-10 NOTE — H&P (Signed)
NAME:  KEM, HENSEN NO.:  [id number]   MEDICAL RECORD NO.:  [id number]          PATIENT TYPE:  AMB   LOCATION:  DAY                           FACILITY:  [facility]   PHYSICIAN:  Ky Barban, M.D.DATE OF BIRTH:  1932-09-17   DATE OF ADMISSION:  DATE OF DISCHARGE:  LH                                HISTORY & PHYSICAL   CHIEF COMPLAINT:  Recurrent carcinoma of bladder.   HISTORY OF PRESENT ILLNESS:  A 77 year old gentleman who was in the hospital  in the middle of November. Underwent TUR of bladder tumor. The tumor is  rather large. I resected part of it. I was waiting for his pathology report.  It came back that there was no invasion of the submucosa. It is low to  moderate grade transition cell carcinoma. He is brought as an outpatient to  undergo resection of the rest of the tumor. The patient was aware of the  problem, and he knows there is always a possibility of bladder perforation,  injury to the surrounding organs like bowel necessitating more extensive  open surgery.   PAST HISTORY:  Refer to his old record.   PHYSICAL EXAMINATION:  GENERAL:  Well-developed, well-nourished male in no  acute distress.  VITAL SIGNS:  Blood pressure 130/80, temperature is normal.  CENTRAL NERVOUS SYSTEM:  Negative.  HEENT:  Negative.  CHEST:  Symmetric.  HEART:  Regular sinus rhythm.  ABDOMEN:  Soft, flat. Liver, spleen and kidneys are not palpable.  EXTERNAL GENITALIA:  Unremarkable.  RECTAL:  Prostate 2+, smooth and firm.   IMPRESSION:  1.  Recurrent bladder tumor.  2.  Diabetes mellitus, insulin dependent.  3.  Hypertension.   PLAN:  TUR of bladder tumor. Then admit him in the hospital. Dr. Nobie Putnam to  follow him with me.      Ky Barban, M.D.  Electronically Signed     MIJ/MEDQ  D:  04/29/2005  T:  04/29/2005  Job:  [id number]   cc:   [name], M.D.  Fax: [phone]

## 2010-10-10 NOTE — Op Note (Signed)
NAME:  Craig Castillo, Craig Castillo NO.:  [id number]   MEDICAL RECORD NO.:  [id number]                   PATIENT TYPE:  AMB   LOCATION:  DAY                                  FACILITY:  [facility]   PHYSICIAN:  Lionel December, M.D.                 DATE OF BIRTH:  [birth year]   DATE OF PROCEDURE:  06/08/2002  DATE OF DISCHARGE:                                 OPERATIVE REPORT   PROCEDURE:  Esophagogastroduodenoscopy with esophageal dilatation followed  by total colonoscopy with polypectomy.   INDICATIONS FOR PROCEDURE:  Mr. Leadbetter is a 77 year old Caucasian male  recently noted to have heme positive stools. He also complains of  intermittent solid food dysphagia but does not experience heartburn often.  He is therefore undergoing both of these studies. Family history is negative  for colorectal carcinoma. Personal history is positive for bladder cancer  for which he has had cystoscopy with excision and is under the care of Dr.  Jerre Simon.   The procedure is reviewed with the patient and informed consent is obtained.   PREOP MEDICATIONS:  Cetacaine spray for pharyngeal topical anesthesia,  Demerol 50 mg IV, Versed 9 mg IV in divided dose.   INSTRUMENT:  Olympus video system.   FINDINGS:  The procedures were performed in endoscopy suite. The patient's  vital signs and O2 saturations were monitored during the procedure and  remained stable.   1. ESOPHAGOGASTRODUODENOSCOPY.  The patient was placed in the left lateral     decubitus position and endoscope was passed through the oropharynx     without any difficulty into the esophagus. The laryngopharynx appeared to     be normal.   ESOPHAGUS.  The mucosa of the esophagus normal except that he had a soft  stricture at the GE  junction along with erosions. There was focal mucosal  swelling just below the Z line. This area looked just like a nodule. It was  suspicious for sentinel folds.   STOMACH.  It was empty and  distended very well with insufflation. The folds  in the proximal stomach were normal. There was a tiny polyp at the gastric  body which was oblated via cold biopsy. There was antral edema and erythema.  There was also a linear scar at the antrum to his lesser curvature. The  pyloric channel was patent. The angularis and fundus were examined by  retroflexing the scope and were normal.   DUODENUM:  Examination of the bulb and second part of the duodenum was  normal. Antral biopsies also taken for CLOtest. The endoscope was withdrawn  and esophagus was dilated by passing 56 French Maloney dilator through the  esophagus without any difficulty. After dilatation was completed, the  endoscope was passed again and stricture was noted to have been effectively  dilated with a single linear tear at the GE  junction. When the veiled  biopsy was taken  from this swollen fold at the GEJ, the endoscope was  withdrawn and the patient was prepared for procedure #2.   1. TOTAL COLONOSCOPY.  Rectal examination performed. This was within normal     limits. The scope was placed in the rectum and advanced under direct     vision to the sigmoid colon and beyond. Preparation was satisfactory. The     scope was passed in the cecum which was identified by the appendiceal     orifice and ileocecal valve. Pictures taken for the record. There was a     small polyp involving the ____________ which was oblated via cold biopsy.     As the scope was withdrawn, the colonic mucosa was once again carefully     examined. There was a polyp about a centimeter in diameter at the     rectosigmoid junction which was snared and was free for histologic     examination. There was another 6 or 7 mm polyp which was snared and     partly coagulated. This polyp was also retrieved for histologic     examination. There was another tiny polyp of the rectum which was     coagulated using snare tip. The scope was retroflexed to examine      anorectal junction which was unremarkable. The endoscope was straightened     and withdrawn. The patient tolerated the procedure well.   FINAL DIAGNOSES:  Soft stricture of the GE  junction with erosive  esophagitis. The stricture was dilated to 107 [place] using a Maloney dilator.  Swollen fold at GE junction on the gastric site which was biopsied for  histology and was suspicious for sentinel folds.   Small polyp at the gastric body which was oblated via cold biopsy.   Antral gastritis with a scar. Biopsy taken for CLOtest.   Colonoscopy performed to the cecum. Small polyp was oblated via cold biopsy  from the cecum and two were snared, one was at the rectosigmoid junction  measuring about a centimeter, another one at the rectum which was smaller.   RECOMMENDATIONS:  1. Antireflux measures.  2. Prilosec OTC 20 mg p.o. q.a.m.  3. No ASA for 10 days.  4. I will contact the patient with the biopsy results and further     recommendations.                                               Lionel December, M.D.    NR/MEDQ  D:  06/08/2002  T:  06/08/2002  Job:  [id number]   cc:   [name], M.D.  [address]  [place]  [place] [address]  Fax: [phone]

## 2010-10-10 NOTE — Discharge Summary (Signed)
NAME:  Craig, Castillo NO.:  [id number]   MEDICAL RECORD NO.:  [id number]          PATIENT TYPE:  INP   LOCATION:  A337                          FACILITY:  [facility]   PHYSICIAN:  Ky Barban, M.D.DATE OF BIRTH:  1932-09-21   DATE OF ADMISSION:  04/07/2005  DATE OF DISCHARGE:  11/20/2006LH                                 DISCHARGE SUMMARY   HOSPITAL COURSE:  This 77 year old male with multiple medical problems has  recurrent bladder tumor.  He has been followed by me since May 2003 and at  that time he was found to have BPH along with bladder tumor.  His BPH was  managed with Flomax and had a TUR of bladder tumor.  It was low intermittent  grade transitional cell carcinoma with no invasion of the muscle.  He came  for followup for a couple of times and no recurrence was seen and has not  been back since 2004 and now he started to have gross hematuria, cystoscopy  is done, there is a large recurrent bladder tumor and I have advised him to  undergo resection of the tumor for which he is being brought as outpatient.  His past history includes coronary artery disease, stent placement September  1997, still uses a nitroglycerin occasionally, and also has diabetes and  hypertension.  He is taking Norvasc, metoprolol and Flomax.  Routine  admission workup is do ne.  He was taken to the operating room where TUR of  bladder tumor was done.  He had rather large bladder tumor and the tumor was  mostly dissected.  Postoperative course is benign.  First postop day he  spiked temperature to 103, abdomen is full and soft so I have started him on  NSAID, blood culture was done and I added gentamicin and pharmacy was  consulted.  Second postop day he is feeling better, urine is still bloody,  temperature is 101 and third postop day he is afebrile, urine is clear,  continue gentamicin, creatinine has gone up to 1.7 and so we discontinued  Foley catheter, he is voiding okay but his  blood sugar is 344.  His  pathology report came back that he has transitional cell carcinoma, no  invasion of the submucosa or muscularis seen.  The patient's blood glucose  is high, I was going to discharge him to home but we are going to keep him  here to control his blood sugar, finally he was started him on insulin  coverage and Dr. Sherwood Gambler is following him closely.  His urine growth also is  showing low growth of enterococcus sensitive to Levaquin so I discontinued  gentamicin and NSAID on November 19 and put him on Levaquin p.o. and his  blood sugar is now manageable under control.  He was told how to use insulin  as per Dr. Sherwood Gambler and I am going to see him back in the office.  He needs to  be brought for additional resection of the tumor but he wants to go home for  Thanksgiving and that is fine so on later date we will bring  him back again.  He will also need BCG intravesical therapy.   FINAL DISCHARGE DIAGNOSES:  1.  Bladder tumor.  2.  Diabetes mellitus.  3.  Hypertension.  4.  Coronary artery disease.   DISCHARGE MEDICATIONS:  Levaquin 500 mg one a day (#7) and continue his  other usual medication.   FOLLOW UP:  I will see him back in 2 weeks in the office.      Ky Barban, M.D.  Electronically Signed     MIJ/MEDQ  D:  06/06/2005  T:  06/07/2005  Job:  [id number]   cc:   Madelin Rear. Sherwood Gambler, MD  Fax: [phone]

## 2010-10-10 NOTE — H&P (Signed)
NAME:  Craig Castillo, Craig Castillo NO.:  [id number]   MEDICAL RECORD NO.:  [id number]          PATIENT TYPE:  AMB   LOCATION:  DAY                           FACILITY:  [facility]   PHYSICIAN:  Ky Barban, M.D.DATE OF BIRTH:  Sep 24, 1932   DATE OF ADMISSION:  DATE OF DISCHARGE:  LH                                HISTORY & PHYSICAL   CHIEF COMPLAINT:  Recurrent bladder tumor.   HISTORY:  A 77 year old gentleman, I have followed him since May 2003. At  that time, he was worked up for BPH. He does have BPH. He was also found to  have bladder tumor. His BPH was managed with Flomax, and he is doing well,  and for bladder tumor, it was resected, and pathology report at that time  showed there is intermediate grade _______________ transitional cell  carcinoma with no evidence of invasion of the muscles. He came for a couple  of times for followup cystoscopy. Then he never came back since late 2004,  in spite of the reasons we have been following him to come back for  followup. Now, he shows up with gross hematuria. Cystoscopy was done. He is  found to have a large bladder tumor recurrence, so he is being brought as  outpatient to undergo TUR of bladder tumor. Procedure risks, limitations,  complications discussed. He understands and wants me to proceed. I have  discussed bladder perforation, bowel injury, etc., as the complication of  this procedure.   PAST HISTORY:  Coronary artery disease and stent in September 1997. He still  uses nitroglycerin occasionally. History of passing kidney stones in the  past.   FAMILY HISTORY:  There is a strong family history of kidney stones. No  history of prostate cancer.   MEDICATIONS:  1.  Norvasc.  2.  Metoprolol.  3.  Flomax.   PERSONAL HISTORY:  He does not smoke or drink.   REVIEW OF SYSTEMS:  Denies any orthopnea, PND, nausea, vomiting. The patient  generally has exertional dyspnea and chest pain, and he has to take  nitroglycerin.   PHYSICAL EXAMINATION:  GENERAL:  Moderately obese, no acute distress.  VITAL SIGNS:  Blood pressure 144/88, temperature 98.2.  CENTRAL NERVOUS SYSTEM:  No gross neurological deficit.  HEENT:  Negative.  CHEST:  Symmetrical.  HEART:  Regular sinus rhythm.  ABDOMEN:  Soft, flat. Liver, spleen and kidneys are not palpable. No CVA  tenderness.  GENITOURINARY:  External genitalia is uncircumcised. Meatus adequate.  Testicles normal.  RECTAL:  Prostate 2+, smooth and firm.   IMPRESSION:  1.  Recurrent bladder tumor.  2.  Benign prostatic hypertrophy.  3.  Coronary artery disease.  4.  Hypertension.   PLAN:  TUR of bladder tumor. Then admit him in the hospital.      Ky Barban, M.D.  Electronically Signed     MIJ/MEDQ  D:  04/06/2005  T:  04/06/2005  Job:  [id number]   cc:   [name], M.D.  Fax: [phone]

## 2010-10-10 NOTE — H&P (Signed)
   NAME:  Craig Castillo, Craig Castillo NO.:  [id number]   MEDICAL RECORD NO.:  [id number]                    PATIENT TYPE:  O   LOCATION:                                       FACILITY:  [facility]   PHYSICIAN:  Ky Barban, M.D.            DATE OF BIRTH:   DATE OF ADMISSION:  DATE OF DISCHARGE:                                HISTORY & PHYSICAL   CHIEF COMPLAINT:  Recurrent bladder tumor.   HISTORY:  [age]-year-old male was diagnosed with bladder tumor in May of  this year and has undergone TUR of bladder tumor.  He came for followup  cystoscopy and was found to have a small recurrence, which is a superficial  tumor, so he is coming as an outpatient to undergo fulguration and biopsy of  this tumor.  He has no urological complaints otherwise.   PAST HISTORY:  Past history of coronary artery disease and had coronary  stent placement on January 30, 1996 and still uses nitroglycerin on a  p.r.n. basis.   FAMILY HISTORY:  History of kidney stones runs in his family.  No history of  prostate cancer.   MEDICATIONS:  Norvasc and metoprolol.   PERSONAL HISTORY:  He does not smoke or drink.   REVIEW OF SYSTEMS:  Unremarkable.   PHYSICAL EXAMINATION:  GENERAL:  Moderately built, not in any acute  distress.  VITAL SIGNS:  Blood pressure is 130/80.  Temperature is normal.  CENTRAL NERVOUS SYSTEM:  Negative.  HEENT:  Negative.  NECK:  Negative.  CHEST:  Symmetrical.  HEART:  Regular sinus rhythm.  ABDOMEN:  Abdomen soft and flat.  Liver, spleen and kidneys are not  palpable.  No CVA tenderness.  GU:  External genitalia:  He is circumcised.  Meatus adequate.  Testicles  are normal.  RECTAL:  Deferred.  EXTREMITIES:  Normal.   IMPRESSION:  Recurrent bladder tumor.   PLAN:  TUR of bladder tumor as outpatient.                                              Ky Barban, M.D.     MIJ/MEDQ  D:  03/09/2002  T:  03/10/2002  Job:  [id number]   cc:   [name], MD  [address]  [place]  [place] [address]  Fax: [phone]

## 2010-10-10 NOTE — Op Note (Signed)
   NAME:  AUDON, HEYMANN NO.:  [id number]   MEDICAL RECORD NO.:  [id number]                  PATIENT TYPE:   LOCATION:                                       FACILITY:   PHYSICIAN:  Ky Barban, M.D.            DATE OF BIRTH:   DATE OF PROCEDURE:  DATE OF DISCHARGE:                                 OPERATIVE REPORT   PREOPERATIVE DIAGNOSIS:  Bladder tumor.   POSTOPERATIVE DIAGNOSIS:  Bladder tumor.   PROCEDURE:  Fulguration and biopsy of the bladder tumor.   SURGEON:  Ky Barban, M.D.   ANESTHESIA:  Spinal   DESCRIPTION OF PROCEDURE:  The patient given spinal anesthesia and placed in  the lithotomy position after sterile prep and drape.  A  #25 cystoscope  introduced into the bladder.  It was inspected.  There are 2 small foci of  papillary tumor located on the left lateral wall.  They were biopsied with  the help of the biopsy forceps and the base was simply fulgurated with green  valve electrode.  The rest of the bladder was thoroughly inspected.  There  was no other tumor seen.  All the instruments were removed.  A #18 Foley  catheter left for drainage. The patient left the operating room in  satisfactory condition.                                               Ky Barban, M.D.    MIJ/MEDQ  D:  03/10/2002  T:  03/10/2002  Job:  [id number]   cc:   [name], MD  [address]  [place]  [place] [address]  Fax: [phone]

## 2010-10-10 NOTE — Op Note (Signed)
NAME:  Craig Castillo, Craig Castillo NO.:  [id number]   MEDICAL RECORD NO.:  [id number]          PATIENT TYPE:  INP   LOCATION:  A314                          FACILITY:  [facility]   PHYSICIAN:  Ky Barban, M.D.DATE OF BIRTH:  10/03/1932   DATE OF PROCEDURE:  03/09/2006  DATE OF DISCHARGE:                                 OPERATIVE REPORT   PREOPERATIVE DIAGNOSIS:  Recurrent bladder tumor.   POSTOPERATIVE DIAGNOSIS:  Recurrent bladder tumor.   PROCEDURE:  Transurethral resection bladder tumor, small, multiple bladder  biopsies, fulguration of the bladder tumor with flexible cystoscopy.   ANESTHESIA:  Spinal.   PROCEDURE IN DETAIL:  The patient underwent spinal anesthesia in lithotomy  position, usual prep and drape. A #28 Iglesias resectoscope was introduced  into the bladder to thoroughly inspect it. There was foci of superficial  papillary tumor on the dome of the bladder. There was some papillary growth  on the right bladder wall around the trigone. It looks more like  inflammatory reaction from VCG rather than tumor. The orifices cannot be  seen. There is scarring and irregular tissue in the area of both ureteral  orifices. On the right bladder wall, this tumor-like growth was resected,  and the area was fulgurated. I could not get to the dome of the bladder with  the resectoscope, so I had to remove the resectoscope. A #25 cystoscope was  introduced. Multiple bladder biopsies were done, and they were labeled  accordingly. Now, the rigid cystoscope was removed, and flexible cystoscope  was introduced to see the tumor on the dome of the bladder. Then using a  Bugby electrode, this area of tumor was simply fulgurated. All of the chips  of the tumor were already removed, and cystoscope was removed. I left #22  Foley catheter for drainage. The patient left the operating room in  satisfactory condition.      Ky Barban, M.D.  Electronically Signed     MIJ/MEDQ  D:  03/09/2006  T:  03/10/2006  Job:  [id number]   cc:   [name], M.D.  Fax: [phone]

## 2010-10-10 NOTE — Op Note (Signed)
NAME:  FREELAND, PRACHT NO.:  [id number]   MEDICAL RECORD NO.:  [id number]          PATIENT TYPE:  OBV   LOCATION:  A328                          FACILITY:  [facility]   PHYSICIAN:  Ky Barban, M.D.DATE OF BIRTH:  July 28, 1932   DATE OF PROCEDURE:  05/01/2005  DATE OF DISCHARGE:                                 OPERATIVE REPORT   PREOPERATIVE DIAGNOSIS:  Bladder tumor.   POSTOPERATIVE DIAGNOSIS:  Bladder tumor.   PROCEDURE:  Transurethral resection of bladder tumor.   ANESTHESIA:  Spinal.   PROCEDURE:  The patient was given spinal anesthesia and placed in lithotomy  position after usual prep and drape. The patient has extensive tumor,  especially on the anterior bladder wall which was very difficult to get to,  and in Trendelenburg position, it was resected. The rest of the tumor in the  bladder was also resected. Bleeders were coagulated. The dissection on the  anterior wall the tumor goes all the way to the level of the bladder neck,  then part of the prostate along side the anterior bladder neck was also  resected. The ureteral orifices could never be seen. There is tumor all of  the bladder although it was a superficial tumor. I tried to get most of the  tumor. Some areas were I could not resect, I simply fulgurated. At this  point, the chips were evacuated. The bleeders were coagulated. The  resectoscope was removed. I left ___________ Foley catheter for drainage.  CBI is clear. The patient left the operating room in satisfactory condition.      Ky Barban, M.D.  Electronically Signed     MIJ/MEDQ  D:  05/01/2005  T:  05/01/2005  Job:  [id number]   cc:   [name], M.D.  Fax: [phone]

## 2010-10-10 NOTE — H&P (Signed)
NAME:  Craig Castillo, Craig Castillo NO.:  [id number]   MEDICAL RECORD NO.:  [id number]                  PATIENT TYPE:   LOCATION:                                       FACILITY:   PHYSICIAN:  Lionel December, M.D.                 DATE OF BIRTH:  11-07-1932   DATE OF ADMISSION:  05/10/2002  DATE OF DISCHARGE:                                HISTORY & PHYSICAL   PRESENTING COMPLAINT:  Heme-positive stools.   HISTORY OF PRESENT ILLNESS:  The patient is a 77 year old Caucasian male who  was recently seen at the [facility] in [place], [place].  He had  three Hemoccults.  One or more of these were positive.  Dr. Allene Dillon  recommended that he should undergo colonoscopy.  The patient therefore  called our office and made an appointment.  His primary care physician is  Dr. Nobie Putnam.  He denies melena or bleeding.  He recently had the flu and  was treated with Z-Pak.  His URI symptoms are resolved but he still has  diarrhea.  He generally has had a good appetite.  He has not lost any weight  recently.  He tells me that he had a CAT scan in June or July 2003 and he is  under the impression that this study also showed colonic polyps.  He denies  heartburn, hoarseness, or chronic cough but he does have intermittent solid  food dysphagia.  He recently choked on cornbread and decided to quit it  altogether.  He has heartburn only with spicy foods, which is occasionally.   REVIEW OF SYSTEMS:  Also positive for occasional chest pain relieved with  nitroglycerin.  The last time he saw Dr. Daleen Squibb was two years ago.  He states  that he is due for an office visit.  He has never had screening for  colorectal carcinoma before.   MEDICATIONS:  1. Lisinopril 10 mg q.d.  2. Metoprolol 50 mg q.d.  3. Mavik 2 mg q.d.  4. Flomax 0.4 mg q.d.  5. Nitroglycerin p.r.n.  6. He is also supposed to be on aspirin which he does not take daily, as     well as hydrochlorothiazide.  He says  it causes him to have cramps and he     does not take it as prescribed.  7. Hydrocodone 1 q.d.   PAST MEDICAL HISTORY:  He has CAD.  He had three stents placed in September  1997.  He apparently has not had any problems other than occasional chest  pain.  Hypertension was diagnosed three years ago.  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  He has been diabetic for one year  and it is diet controlled.  History of kidney stones, felt to be small and  not causing any problems.  He had open cholecystectomy in 1984.  He has had  three lesions removed  from his bladder via cystoscopy which turned out to be  cancer.  He says two were small and one was large.  He had a cystoscopy in  July 2003 and then again in September 2003.  He is being followed by Dr.  Jerre Simon.   ALLERGIES:  1. CELEBREX.  2. VIOXX.  3. Certain MYCINS.   FAMILY HISTORY:  Father died in an auto accident at age 50.  Mother died of  CVA in her 34s.  He has five sisters and six brothers.  One sister and a  brother have heart problems.  One sister in her 61s is being treated for  carcinoma of the breast and one brother is diabetic.   SOCIAL HISTORY:  He is married and has one son.  He is self-employed.  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  He quit cigarette  smoking a long time ago.  He smoked for no more than 10 years.  He does not  drink alcohol.   PHYSICAL EXAMINATION:  GENERAL:  A pleasant, moderately obese Caucasian male  who is in no acute distress.  VITAL SIGNS:  He weighs 247 pounds.  He is 5 feet 8 inches tall.  Pulse 84  per minute, blood pressure 150/80, temperature is 98.4.  HEENT:  Conjunctiva is pink.  Sclera is nonicteric.  Oropharyngeal mucosa is  normal.  A few teeth are missing and the rest in satisfactory condition.  NECK:  Without masses or thyromegaly.  Carotids are 2+ bilaterally and  without bruits.  CARDIAC:  Regular rhythm.  Normal S1, S2.  No  murmur or gallop noted.  LUNGS:  Clear to auscultation.  ABDOMEN:  Protuberant but soft and nontender without organomegaly or masses.  RECTAL:  Examination deferred.  EXTREMITIES:  No does not have clubbing or peripheral edema.   ASSESSMENT:  The patient has two gastrointestinal problems which are:  1. Heme-positive stools.  He does not have any symptoms to suggest the lower     gastrointestinal tract disease.  His colon needs to be examined to make     sure he does not have a colonic neoplasm.  2. Solid food dysphagia, which he has had off and on for years.  Suspect he     has an esophageal ring and therefore would benefit from EGD and ED.   RECOMMENDATIONS:  Esophagogastroduodenoscopy with esophageal dilatation  followed by total colonoscopy.  I have revealed both of the procedures and  risks with the patient and he is agreeable.  I encouraged him to take his  aspirin every day although he will have to hold it for two days prior to the  procedure.  I also recommended that he could take half or one-fourth of  usual dose of hydrochlorothiazide.  If he still has problems, he may want to  follow up with Dr. Nobie Putnam.  I would also review his abdominal CT and see  if indeed polyps were seen on this study.                                               Lionel December, M.D.    NR/MEDQ  D:  05/10/2002  T:  05/10/2002  Job:  [id number]   cc:   Dr. Allene Dillon  [facility]  [place], [place]   [name], M.D.  Sallyanne Havers Senaida Ores  [address], [address]  [place]  [place] [address]  Fax: [phone]

## 2010-10-10 NOTE — Discharge Summary (Signed)
NAME:  WHEELER, INCORVAIA NO.:  [id number]   MEDICAL RECORD NO.:  [id number]          PATIENT TYPE:  INP   LOCATION:  A328                          FACILITY:  [facility]   PHYSICIAN:  Ky Barban, M.D.DATE OF BIRTH:  07-24-1932   DATE OF ADMISSION:  05/01/2005  DATE OF DISCHARGE:  12/10/2006LH                                 DISCHARGE SUMMARY   HISTORY:  This 77 year old gentleman was here in the hospital middle of last  month underwent TUR of bladder tumor, he rather had large tumor which was  resected partly and he is brought back for resection of the tumor again.  He  has a low to moderate grade transitional cell carcinoma without invasion of  the muscles.  The tumor is located in front of the bladder neck and he is  quite symptomatic for having difficulty voiding.  After doing routine  admission workup he was taken to the operating room on May 01, 2005,  underwent TUR of bladder tumor, the tumor was removed completely and first  postop day he was afebrile, urine is clear, he was followed up and seen by  his primary care physician.  On May 03, 2005, he is afebrile, general  status is good, urine is clear, and his Foley catheter is removed.  His  pathology report shows current transitional cell carcinoma low to moderate  grade and there is no tumor invasion in the submucosa or the muscles.  His  preoperative workup WBC count is 7.6, hematocrit 38.3 on April 29, 2005.  First postop day on May 02, 2005 his WBC count is 8.6, hematocrit 34  and his MET-7 shows sodium 139, potassium 4.4, chloride 104, CO2 is 27,  glucose 163, BUN is 20, creatinine 1.2 and his first postop day May 02, 2005 sodium 134, potassium 3.9, chloride 101, CO2 is 29, glucose 116, BUN is  12, creatinine is 1.2.   He is being discharged and will be followed up by me in the office.   FINAL DISCHARGE DIAGNOSES:  1.  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  2.  Coronary artery disease.  3.  Insulin-dependent diabetes.   He is advised to continue using his usual medications and I will see him  back in 2 weeks in the office and we will plan to given intravesical BCG.      Ky Barban, M.D.  Electronically Signed     MIJ/MEDQ  D:  06/06/2005  T:  06/07/2005  Job:  [id number]   cc:   Madelin Rear. Sherwood Gambler, MD  Fax: [phone]

## 2010-10-10 NOTE — Procedures (Signed)
NAME:  HULON, FERRON NO.:  [id number]   MEDICAL RECORD NO.:  [id number]          PATIENT TYPE:  OUT   LOCATION:  RESP                          FACILITY:  [facility]   PHYSICIAN:  Edward L. Juanetta Gosling, M.D.DATE OF BIRTH:  November 07, 1932   DATE OF PROCEDURE:  DATE OF DISCHARGE:  01/03/2008                            PULMONARY FUNCTION TEST   PULMONARY FUNCTION TESTS  1. Spirometry shows no definite ventilatory defect, but evidence of      airflow obstruction, most marked in the smaller airways.  Please      note this does not appear to be totally consistent with spirometric      examination.  2. Lung volumes are normal.  3. DLCO is normal.  4. Arterial blood gases are normal.  5. There does not appear to be a pulmonary cause for dyspnea      demonstrated on this pulmonary function test.      Ramon Dredge L. Juanetta Gosling, M.D.  Electronically Signed     ELH/MEDQ  D:  01/06/2008  T:  01/06/2008  Job:  [id number]

## 2010-10-10 NOTE — Discharge Summary (Signed)
   NAME:  BRENN, DEZIEL NO.:  [id number]   MEDICAL RECORD NO.:  [id number]                  PATIENT TYPE:   LOCATION:                                       FACILITY:   PHYSICIAN:  Ky Barban, M.D.            DATE OF BIRTH:   DATE OF ADMISSION:  11/02/2001  DATE OF DISCHARGE:  11/04/2001                                 DISCHARGE SUMMARY   HISTORY OF PRESENT ILLNESS:  This 77 year old gentleman was diagnosed with  bladder tumor, was worked up as an outpatient, advised to undergo TUR  bladder tumor which he agreed, so he was brought into the hospital as an  outpatient to undergo TUR bladder tumor.   HOSPITAL COURSE:  He underwent admission workup with CBC, Chem-7,  urinalysis, EKG were within normal limits.  He was taken to the operating  room on June 11.  TUR of the bladder tumor, which was located in the right  hemitrigone, was done and. after resecting the tumor, biopsy from the base  of the tumor was done.  The final report on the tumor came back as low to  intermediate transitional cell carcinoma.  No muscular invasion is seen.  Biopsy from the tumor  base shows no tumor seen, benign fragment of fibrous  connective tissue.  The patient has history of coronary artery disease and  had a coronary stent implanted in September 1997.  Still occasionally taking  nitroglycerin but not on a regular basis.  He does have hypertension and is  on Norvasc and metoprolol.  He is a nonsmoker.  No history of cancer in his  family.  After resecting the tumor, first postoperative day he was doing  fine.  Urine is clear.  He is closely followed up by his family physician,  Dr. Nobie Putnam.  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  He is started on  a regular diet.  The second postoperative day, afebrile, urine clear, Foley  catheter is discontinued, and he is discharged home.  I will see him in the  office in two weeks.   FINAL DISCHARGE DIAGNOSES:  1.  Bladder tumor, non-invasive.  2. Hypertension.  3. Coronary artery disease.                                               Ky Barban, M.D.    MIJ/MEDQ  D:  01/28/2002  T:  01/30/2002  Job:  [id number]   cc:   Jonell Cluck, M.D.  [address], [address]  [place]  [place] [address]  Fax: [phone]

## 2010-10-10 NOTE — Op Note (Signed)
NAME:  Craig Castillo, Craig Castillo NO.:  [id number]   MEDICAL RECORD NO.:  [id number]          PATIENT TYPE:  INP   LOCATION:  A337                          FACILITY:  [facility]   PHYSICIAN:  Ky Barban, M.D.DATE OF BIRTH:  Sep 09, 1932   DATE OF PROCEDURE:  04/07/2005  DATE OF DISCHARGE:                                 OPERATIVE REPORT   PREOPERATIVE DIAGNOSIS:  Recurrent bladder tumor.   POSTOPERATIVE DIAGNOSIS:  Recurrent bladder tumor.   PROCEDURE:  Transurethral resection of bladder tumor.   ANESTHESIA:  Spinal.   The patient was given spinal anesthesia in lithotomy position, usual prep  and drape. Urethra was dilated with Sissy Hoff sounds to 30 [place]. Then 28  Iglesias resectoscope was introduced. Bladder was inspected. He had rather  extensive bladder tumor located all over the bladder starting from the  bladder neck, coming around the anterior bladder wall dome, right and left  bladder wall, and the trigone. The orifices could not be seen. The tumor  resection was started on the trigone, then around the bladder neck. This  part of the tumor was resected, but the rest of the tumor was fulgurated.  Chips were evacuated. There was still residual tumor. I wanted to see the  pathology to decide about bringing him back and doing resection or some  other treatment. Chips were evacuated. Bleeders were anticoagulated.  Resectoscope was removed. A _____________ Foley catheter left in for  drainage. The patient left the operating room in satisfactory condition.      Ky Barban, M.D.  Electronically Signed     MIJ/MEDQ  D:  04/07/2005  T:  04/07/2005  Job:  [id number]

## 2010-10-10 NOTE — Op Note (Signed)
NAME:  Craig Castillo, Craig Castillo NO.:  [id number]   MEDICAL RECORD NO.:  [id number]          PATIENT TYPE:  OBV   LOCATION:  A316                          FACILITY:  [facility]   PHYSICIAN:  Ky Barban, M.D.DATE OF BIRTH:  07/30/1932   DATE OF PROCEDURE:  08/14/2005  DATE OF DISCHARGE:  08/15/2005                                 OPERATIVE REPORT   PREOPERATIVE DIAGNOSIS:  Recurrent bladder tumor.   POSTOPERATIVE DIAGNOSIS:  Recurrent bladder tumor.   PROCEDURE:  TUR of bladder tumor.   ANESTHESIA:  Spinal.   PROCEDURE:  The patient had spinal anesthesia.  After the usual prep and  drape, a #28 Iglesias resectoscope was introduced into the bladder.  There  were several foci of the tumor in the bladder scattered all over.  Some of  them were resected.  Mostly, they were fulgurated.  A lot of this tumor  recurrence was on the anterior and the right bladder wall at that junction,  and it was partially resected, and some of it was simply fulgurated.  Chips  were evacuated.  Bleeders were coagulated.  The bladder looks quite inflamed  and erythematous, with a lot of bullous edema.  Chips were evacuated.  Bleeders were coagulated.  At this point, the resectoscope was removed.  A  #22 three-way Foley catheter was left in for drainage.  The patient left the  operating room in satisfactory condition.      Ky Barban, M.D.  Electronically Signed     MIJ/MEDQ  D:  08/14/2005  T:  08/19/2005  Job:  [id number]   cc:   ? Ivar Bury

## 2010-10-10 NOTE — Op Note (Signed)
[facility]  Patient:    [name], [name] Visit Number: [id number] MRN: [id number]          Service Type: SUR Location: 2A A227 01 Attending Physician:  Alleen Borne I Dictated by:   Alleen Borne, M.D. Proc. Date: 11/02/01 Admit Date:  11/02/2001 Discharge Date: 11/04/2001                             Operative Report  PREOPERATIVE DIAGNOSIS:  Bladder tumor, large.  POSTOPERATIVE DIAGNOSIS:  Bladder tumor, large.  OPERATION:  Transurethral resection of bladder tumor and biopsy from the base of the bladder tumor.  SURGEON:  Alleen Borne, M.D.  ANESTHESIA:  Spinal.  DESCRIPTION OF PROCEDURE:  The patient was given spinal anesthesia, placed in lithotomy position.  After usual prep and drape, a #28 Iglesias resectoscope was introduced into the bladder.  Once again the bladder was inspected.  There was a large tumor located on the left bladder wall coming towards the anterior bladder wall.  Resection of the tumor is started from the surface towards the base of the bladder.  Tumor is slowly and gradually resected down to the base, and complete resection of the tumor is done.  When it appeared that the tumor was completely resected, I took a cold biopsy forceps and biopsy from the base of the tumor is taken.  Then, using a Greenwald electrode, the base of the tumor is fulgurated, and also some of the surrounding mucosa was fulgurated. The chips are evacuated.  There is no bleeding going on.  The resectoscope was removed, and 20-Foley catheter was left in for drainage.  The patient left the operating room in satisfactory condition. Dictated by:   Alleen Borne, M.D. Attending Physician:  Alleen Borne I DD:  11/02/01 TD:  11/04/01 Job: [id number]

## 2011-01-28 ENCOUNTER — Other Ambulatory Visit (HOSPITAL_COMMUNITY): Payer: Self-pay | Admitting: Urology

## 2011-01-28 DIAGNOSIS — C679 Malignant neoplasm of bladder, unspecified: Secondary | ICD-10-CM

## 2011-02-02 ENCOUNTER — Ambulatory Visit (HOSPITAL_COMMUNITY)
Admission: RE | Admit: 2011-02-02 | Discharge: 2011-02-02 | Disposition: A | Discharge status: Home / Self Care | Payer: PRIVATE HEALTH INSURANCE | Source: Ambulatory Visit | Attending: Urology | Admitting: Urology

## 2011-02-02 DIAGNOSIS — C679 Malignant neoplasm of bladder, unspecified: Secondary | ICD-10-CM

## 2011-02-02 DIAGNOSIS — Q619 Cystic kidney disease, unspecified: Secondary | ICD-10-CM | POA: Insufficient documentation

## 2011-02-02 DIAGNOSIS — N1330 Unspecified hydronephrosis: Secondary | ICD-10-CM | POA: Insufficient documentation

## 2011-02-16 LAB — BASIC METABOLIC PANEL
BUN: 27 — ABNORMAL HIGH
CO2: 29
Chloride: 103
Chloride: 105
GFR calc Af Amer: >60
GFR calc Af Amer: >60
GFR calc non Af Amer: 54 — ABNORMAL LOW
Potassium: 3.9
Potassium: 3.9

## 2011-02-16 LAB — CBC
HCT: 37.9 — ABNORMAL LOW
HCT: 42.9
Hemoglobin: 13.4
Hemoglobin: 14.9
MCHC: 34.8
MCHC: 35.3
MCV: 86.8
RBC: 4.36
RBC: 4.85
RDW: 14.1
WBC: 9.4

## 2011-02-16 LAB — DIFFERENTIAL
Basophils Absolute: 0
Basophils Relative: 0
Basophils Relative: 0
Eosinophils Absolute: 0.2
Eosinophils Relative: 3
Eosinophils Relative: 3
Lymphocytes Relative: 13
Lymphs Abs: 1.1
Monocytes Absolute: 0.7
Monocytes Absolute: 0.7
Monocytes Relative: 7
Monocytes Relative: 7
Neutro Abs: 7.3

## 2011-02-16 LAB — URINALYSIS, ROUTINE W REFLEX MICROSCOPIC
Bilirubin Urine: NEGATIVE
Ketones, ur: NEGATIVE
Nitrite: NEGATIVE
Specific Gravity, Urine: 1.02
pH: 6

## 2011-02-16 LAB — URINE MICROSCOPIC-ADD ON

## 2011-02-20 LAB — BLOOD GAS, ARTERIAL
Acid-Base Excess: 3.1 — ABNORMAL HIGH
Bicarbonate: 27.2 — ABNORMAL HIGH
O2 Content: 21
O2 Saturation: 95.5
TCO2: 23.9
pO2, Arterial: 76.2 — ABNORMAL LOW

## 2011-04-22 ENCOUNTER — Encounter: Payer: Self-pay | Admitting: Cardiovascular Disease

## 2011-06-21 ENCOUNTER — Emergency Department (HOSPITAL_COMMUNITY)
Admission: EM | Admit: 2011-06-21 | Discharge: 2011-06-21 | Disposition: A | Discharge status: Home / Self Care | Payer: PRIVATE HEALTH INSURANCE | Attending: Emergency Medicine | Admitting: Emergency Medicine

## 2011-06-21 ENCOUNTER — Encounter (HOSPITAL_COMMUNITY): Payer: Self-pay | Admitting: *Deleted

## 2011-06-21 DIAGNOSIS — I2510 Atherosclerotic heart disease of native coronary artery without angina pectoris: Secondary | ICD-10-CM | POA: Insufficient documentation

## 2011-06-21 DIAGNOSIS — I10 Essential (primary) hypertension: Secondary | ICD-10-CM | POA: Insufficient documentation

## 2011-06-21 DIAGNOSIS — B029 Zoster without complications: Secondary | ICD-10-CM | POA: Insufficient documentation

## 2011-06-21 DIAGNOSIS — E785 Hyperlipidemia, unspecified: Secondary | ICD-10-CM | POA: Insufficient documentation

## 2011-06-21 DIAGNOSIS — M1990 Unspecified osteoarthritis, unspecified site: Secondary | ICD-10-CM | POA: Insufficient documentation

## 2011-06-21 MED ORDER — TRAMADOL HCL 50 MG PO TABS: 50.0000 mg | ORAL_TABLET | Freq: Four times a day (QID) | ORAL | Status: AC | PRN

## 2011-06-21 MED ORDER — VALACYCLOVIR HCL 500 MG PO TABS
1000.0000 mg | ORAL_TABLET | Freq: Once | ORAL | Status: AC
  Administered 2011-06-21: 1000 mg via ORAL
  Filled 2011-06-21: qty 2

## 2011-06-21 MED ORDER — VALACYCLOVIR HCL 1 G PO TABS: 1000.0000 mg | ORAL_TABLET | Freq: Three times a day (TID) | ORAL | Status: AC

## 2011-06-21 MED ORDER — TRAMADOL HCL 50 MG PO TABS
50.0000 mg | ORAL_TABLET | Freq: Once | ORAL | Status: AC
  Administered 2011-06-21: 50 mg via ORAL
  Filled 2011-06-21: qty 1

## 2011-06-21 MED ORDER — CLINDAMYCIN HCL 150 MG PO CAPS: 300.0000 mg | ORAL_CAPSULE | Freq: Three times a day (TID) | ORAL | Status: AC

## 2011-06-21 MED ORDER — CLINDAMYCIN HCL 150 MG PO CAPS
300.0000 mg | ORAL_CAPSULE | Freq: Once | ORAL | Status: AC
  Administered 2011-06-21: 300 mg via ORAL
  Filled 2011-06-21: qty 2

## 2011-06-21 NOTE — ED Provider Notes (Signed)
History   This chart was scribed for Craig Munch, MD by Clarita Crane. The patient was seen in room APA07/APA07 and the patient's care was started at 10:24AM.   CSN: [id number]  Arrival date & time 06/21/11  1000   First MD Initiated Contact with Patient 06/21/11 1012      Chief Complaint  Patient presents with  . Rash  . Arm Pain    (Consider location/radiation/quality/duration/timing/severity/associated sxs/prior treatment) HPI Craig Castillo is a 78 y.o. male who presents to the Emergency Department complaining of acute onset moderate to severe rash to left shoulder and left upper extremity onset 2 days ago and persistent since with associated waxing and waning pain to areas of rash and blistering. Patient notes he has also been experiencing pruritus for the past month and has been evaluated by PCP and prescribed hydroxyzine but is not currently taking medication. States pruritus was relieved with use of Benadryl. Denies fever, chills, nausea, vomiting, diarrhea, SOB, abdominal pain, chest pain and recent new exposures. Patient with h/o HTN, ASCVD, DJD.  Past Medical History  Diagnosis Date  . ASCVD (arteriosclerotic cardiovascular disease)   . Dyspnea   . Dyslipidemia   . DJD (degenerative joint disease)   . Hypertension     Past Surgical History  Procedure Date  . Cholecystectomy     History reviewed. No pertinent family history.  History  Substance Use Topics  . Smoking status: Unknown If Ever Smoked  . Smokeless tobacco: Not on file  . Alcohol Use: No      Review of Systems 10 Systems reviewed and are negative for acute change except as noted in the HPI.  Allergies  Celecoxib; Hydrocodone-acetaminophen; Morphine; Niacin; and Piroxicam  Home Medications   Current Outpatient Rx  Name Route Sig Dispense Refill  . AMLODIPINE BESYLATE 10 MG PO TABS Oral Take 10 mg by mouth daily.      Marland Kitchen GEMFIBROZIL 600 MG PO TABS Oral Take 600 mg by mouth daily.      Marland Kitchen  HYDROCHLOROTHIAZIDE 25 MG PO TABS Oral Take 25 mg by mouth daily.      . INSULIN ISOPHANE HUMAN 100 UNIT/ML [place] SUSP Subcutaneous Inject 20 Units into the skin 2 (two) times daily.      Marland Kitchen LISINOPRIL 40 MG PO TABS Oral Take 40 mg by mouth daily.      Marland Kitchen METOPROLOL TARTRATE 50 MG PO TABS Oral Take 50 mg by mouth 2 (two) times daily.      Marland Kitchen NITROGLYCERIN 0.4 MG SL SUBL Sublingual Place 0.4 mg under the tongue as directed.      Marland Kitchen OMEPRAZOLE 20 MG PO CPDR Oral Take 20 mg by mouth daily.      Marland Kitchen SIMVASTATIN 40 MG PO TABS Oral Take 40 mg by mouth at bedtime.      . TAMSULOSIN HCL 0.4 MG PO CAPS Oral Take 0.4 mg by mouth daily.        BP 186/75  Pulse 60  Temp(Src) 97.4 F (36.3 C) (Oral)  Resp 16  Ht 5\' 8"  (1.727 m)  Wt 245 lb (111.131 kg)  BMI 37.25 kg/m2  SpO2 98%  Physical Exam  Nursing note and vitals reviewed. Constitutional: He is oriented to person, place, and time. He appears well-developed and well-nourished. No distress.  HENT:  Head: Normocephalic and atraumatic.  Eyes: EOM are normal. Pupils are equal, round, and reactive to light.  Neck: Neck supple. No tracheal deviation present.  Cardiovascular: Normal rate and regular  rhythm.  Exam reveals no gallop and no friction rub.   No murmur heard. Pulmonary/Chest: Effort normal. No respiratory distress.  Abdominal: Soft. He exhibits no distension. There is no tenderness.  Musculoskeletal: Normal range of motion. He exhibits no edema.  Neurological: He is alert and oriented to person, place, and time. No sensory deficit.  Skin: Skin is warm and dry. Rash noted.       Mildly raised patchy groupings of erythematous lesions to LUE. C5 dermatome  Psychiatric: He has a normal mood and affect. His behavior is normal.    ED Course  Procedures (including critical care time)  DIAGNOSTIC STUDIES: Oxygen Saturation is 98% on room air, normal by my interpretation.    COORDINATION OF CARE: 10:31AM- Patient informed of current plan for  treatment and evaluation in ED.  Will d/c home with follow up with PCP. Patient agrees with plan set forth at this time.   Labs Reviewed - No data to display No results found.   No diagnosis found.    MDM  This generally well elderly male now presents with new left shoulder pain and rash.  Notably the patient does have a recent history of dermatitis like condition.  If this is fine condition where present, the patient's presentation would be nearly, entirely consistent with shingles.  Given the baseline condition there's some concern for superinfection.  The patient we treated for shingles, as well as provided antibiotics with this thought of co-infection.  The absence of fevers, chills, chest pain, exertional worsening of his pain, other focal complaints is all reassuring for this being a cutaneous/neurologic phenomena.  The patient has PMD followup availability.  He was advised to call his primary care physician tomorrow to arrange a repeat evaluation in 2 or 3 days.  He was discharged with antivirals, antibiotics, analgesics.      I personally performed the services described in this documentation, which was scribed in my presence. The recorded information has been reviewed and considered.    Craig Munch, MD 06/21/11 1043

## 2011-06-21 NOTE — ED Notes (Signed)
Pt c/o pain to left side shoulder. Pt has red rash and blisters to left shoulder and arm.

## 2011-06-22 ENCOUNTER — Telehealth: Payer: Self-pay

## 2011-06-22 NOTE — Telephone Encounter (Signed)
Pt referred from Dr. Phillips Odor for screening colonoscopy. H/O polyps. Tried to call, many rings and no answer. Mailing a letter to call.

## 2011-06-22 NOTE — Telephone Encounter (Signed)
Pt called back before I could mail the letter. He said he has shingles now, and will call in a couple of weeks to get scheduled an OV prior to his colonoscopy.

## 2011-08-27 ENCOUNTER — Ambulatory Visit: Payer: PRIVATE HEALTH INSURANCE | Admitting: Urgent Care

## 2011-08-31 ENCOUNTER — Other Ambulatory Visit (HOSPITAL_COMMUNITY): Payer: Self-pay | Admitting: Urology

## 2011-08-31 ENCOUNTER — Encounter: Payer: Self-pay | Admitting: Internal Medicine

## 2011-08-31 DIAGNOSIS — C679 Malignant neoplasm of bladder, unspecified: Secondary | ICD-10-CM

## 2011-09-01 ENCOUNTER — Ambulatory Visit: Payer: PRIVATE HEALTH INSURANCE | Admitting: Gastroenterology

## 2011-09-03 ENCOUNTER — Ambulatory Visit (HOSPITAL_COMMUNITY)
Admission: RE | Admit: 2011-09-03 | Discharge: 2011-09-03 | Disposition: A | Discharge status: Home / Self Care | Payer: PRIVATE HEALTH INSURANCE | Source: Ambulatory Visit | Attending: Urology | Admitting: Urology

## 2011-09-03 DIAGNOSIS — Q619 Cystic kidney disease, unspecified: Secondary | ICD-10-CM | POA: Insufficient documentation

## 2011-09-03 DIAGNOSIS — N1330 Unspecified hydronephrosis: Secondary | ICD-10-CM | POA: Insufficient documentation

## 2011-09-03 DIAGNOSIS — C679 Malignant neoplasm of bladder, unspecified: Secondary | ICD-10-CM | POA: Insufficient documentation

## 2011-11-19 ENCOUNTER — Emergency Department (HOSPITAL_COMMUNITY): Payer: PRIVATE HEALTH INSURANCE

## 2011-11-19 ENCOUNTER — Inpatient Hospital Stay (HOSPITAL_COMMUNITY)
Admission: EM | Admit: 2011-11-19 | Discharge: 2011-11-20 | DRG: 603 | Disposition: A | Discharge status: Home / Self Care | Payer: PRIVATE HEALTH INSURANCE | Attending: Internal Medicine | Admitting: Internal Medicine

## 2011-11-19 ENCOUNTER — Encounter (HOSPITAL_COMMUNITY): Payer: Self-pay | Admitting: Emergency Medicine

## 2011-11-19 DIAGNOSIS — E119 Type 2 diabetes mellitus without complications: Secondary | ICD-10-CM | POA: Diagnosis present

## 2011-11-19 DIAGNOSIS — L03119 Cellulitis of unspecified part of limb: Principal | ICD-10-CM | POA: Diagnosis present

## 2011-11-19 DIAGNOSIS — M1990 Unspecified osteoarthritis, unspecified site: Secondary | ICD-10-CM | POA: Diagnosis present

## 2011-11-19 DIAGNOSIS — I10 Essential (primary) hypertension: Secondary | ICD-10-CM

## 2011-11-19 DIAGNOSIS — N189 Chronic kidney disease, unspecified: Secondary | ICD-10-CM | POA: Diagnosis present

## 2011-11-19 DIAGNOSIS — E669 Obesity, unspecified: Secondary | ICD-10-CM | POA: Diagnosis present

## 2011-11-19 DIAGNOSIS — R509 Fever, unspecified: Secondary | ICD-10-CM | POA: Diagnosis present

## 2011-11-19 DIAGNOSIS — Z8551 Personal history of malignant neoplasm of bladder: Secondary | ICD-10-CM

## 2011-11-19 DIAGNOSIS — Z79899 Other long term (current) drug therapy: Secondary | ICD-10-CM

## 2011-11-19 DIAGNOSIS — Z6836 Body mass index (BMI) 36.0-36.9, adult: Secondary | ICD-10-CM

## 2011-11-19 DIAGNOSIS — E785 Hyperlipidemia, unspecified: Secondary | ICD-10-CM | POA: Diagnosis present

## 2011-11-19 DIAGNOSIS — Z794 Long term (current) use of insulin: Secondary | ICD-10-CM

## 2011-11-19 DIAGNOSIS — N184 Chronic kidney disease, stage 4 (severe): Secondary | ICD-10-CM | POA: Diagnosis present

## 2011-11-19 DIAGNOSIS — L02419 Cutaneous abscess of limb, unspecified: Secondary | ICD-10-CM

## 2011-11-19 DIAGNOSIS — I2510 Atherosclerotic heart disease of native coronary artery without angina pectoris: Secondary | ICD-10-CM | POA: Diagnosis present

## 2011-11-19 DIAGNOSIS — R6889 Other general symptoms and signs: Secondary | ICD-10-CM | POA: Diagnosis present

## 2011-11-19 DIAGNOSIS — C679 Malignant neoplasm of bladder, unspecified: Secondary | ICD-10-CM | POA: Diagnosis present

## 2011-11-19 DIAGNOSIS — I129 Hypertensive chronic kidney disease with stage 1 through stage 4 chronic kidney disease, or unspecified chronic kidney disease: Secondary | ICD-10-CM | POA: Diagnosis present

## 2011-11-19 DIAGNOSIS — L03115 Cellulitis of right lower limb: Secondary | ICD-10-CM | POA: Diagnosis present

## 2011-11-19 HISTORY — DX: Chronic kidney disease, stage 3 unspecified: N18.30

## 2011-11-19 HISTORY — DX: Chronic kidney disease, stage 3 (moderate): N18.3

## 2011-11-19 LAB — CBC WITH DIFFERENTIAL/PLATELET
Basophils Relative: 0 % (ref 0–1)
Eosinophils Absolute: 0.1 10*3/uL (ref 0.0–0.7)
HCT: 42.5 % (ref 39.0–52.0)
Hemoglobin: 14.6 g/dL (ref 13.0–17.0)
Lymphocytes Relative: 7 % — ABNORMAL LOW (ref 12–46)
Lymphs Abs: 0.9 10*3/uL (ref 0.7–4.0)
MCHC: 34.4 g/dL (ref 30.0–36.0)
MCV: 84.8 fL (ref 78.0–100.0)
Monocytes Relative: 6 % (ref 3–12)
Neutro Abs: 11.7 10*3/uL — ABNORMAL HIGH (ref 1.7–7.7)

## 2011-11-19 LAB — URINALYSIS, ROUTINE W REFLEX MICROSCOPIC
Bilirubin Urine: NEGATIVE
Glucose, UA: 250 mg/dL — AB
Ketones, ur: NEGATIVE mg/dL
Nitrite: NEGATIVE
Specific Gravity, Urine: 1.02 (ref 1.005–1.030)
pH: 6 (ref 5.0–8.0)

## 2011-11-19 LAB — HEPATIC FUNCTION PANEL
ALT: 9 U/L (ref 0–53)
AST: 11 U/L (ref 0–37)
Alkaline Phosphatase: 73 U/L (ref 39–117)
Indirect Bilirubin: 0.3 mg/dL (ref 0.3–0.9)
Total Protein: 6.4 g/dL (ref 6.0–8.3)

## 2011-11-19 LAB — BASIC METABOLIC PANEL
BUN: 24 mg/dL — ABNORMAL HIGH (ref 6–23)
Chloride: 102 mEq/L (ref 96–112)
Glucose, Bld: 274 mg/dL — ABNORMAL HIGH (ref 70–99)
Potassium: 4.1 mEq/L (ref 3.5–5.1)

## 2011-11-19 LAB — MAGNESIUM: Magnesium: 1.6 mg/dL (ref 1.5–2.5)

## 2011-11-19 LAB — URINE MICROSCOPIC-ADD ON

## 2011-11-19 MED ORDER — ACETAMINOPHEN 650 MG RE SUPP: 650.0000 mg | Freq: Four times a day (QID) | RECTAL | Status: DC | PRN

## 2011-11-19 MED ORDER — PANTOPRAZOLE SODIUM 40 MG PO TBEC
40.0000 mg | DELAYED_RELEASE_TABLET | Freq: Every day | ORAL | Status: DC
  Administered 2011-11-20 (×2): 40 mg via ORAL
  Filled 2011-11-19 (×2): qty 1

## 2011-11-19 MED ORDER — BISACODYL 5 MG PO TBEC: 5.0000 mg | DELAYED_RELEASE_TABLET | Freq: Every day | ORAL | Status: DC | PRN

## 2011-11-19 MED ORDER — FLEET ENEMA 7-19 GM/118ML RE ENEM: 1.0000 | ENEMA | Freq: Once | RECTAL | Status: AC | PRN

## 2011-11-19 MED ORDER — METOPROLOL TARTRATE 50 MG PO TABS
50.0000 mg | ORAL_TABLET | Freq: Two times a day (BID) | ORAL | Status: DC
  Administered 2011-11-20 (×2): 50 mg via ORAL
  Filled 2011-11-19 (×2): qty 1

## 2011-11-19 MED ORDER — ALBUTEROL SULFATE (5 MG/ML) 0.5% IN NEBU
2.5000 mg | INHALATION_SOLUTION | Freq: Once | RESPIRATORY_TRACT | Status: DC
Start: 2011-11-19 — End: 2011-11-19
  Filled 2011-11-19: qty 0.5

## 2011-11-19 MED ORDER — GEMFIBROZIL 600 MG PO TABS
600.0000 mg | ORAL_TABLET | Freq: Two times a day (BID) | ORAL | Status: DC
  Administered 2011-11-20: 600 mg via ORAL
  Filled 2011-11-19: qty 1

## 2011-11-19 MED ORDER — POTASSIUM CHLORIDE IN NACL 20-0.9 MEQ/L-% IV SOLN
INTRAVENOUS | Status: DC
  Administered 2011-11-20: via INTRAVENOUS

## 2011-11-19 MED ORDER — ENOXAPARIN SODIUM 40 MG/0.4ML ~~LOC~~ SOLN
40.0000 mg | SUBCUTANEOUS | Status: DC
  Administered 2011-11-20: 40 mg via SUBCUTANEOUS
  Filled 2011-11-19: qty 0.4

## 2011-11-19 MED ORDER — INSULIN NPH (HUMAN) (ISOPHANE) 100 UNIT/ML ~~LOC~~ SUSP
20.0000 [IU] | Freq: Two times a day (BID) | SUBCUTANEOUS | Status: DC
  Filled 2011-11-19: qty 10

## 2011-11-19 MED ORDER — PIPERACILLIN-TAZOBACTAM 3.375 G IVPB
3.3750 g | Freq: Once | INTRAVENOUS | Status: AC
  Administered 2011-11-19: 3.375 g via INTRAVENOUS
  Filled 2011-11-19: qty 50

## 2011-11-19 MED ORDER — NITROGLYCERIN 0.4 MG SL SUBL: 0.4000 mg | SUBLINGUAL_TABLET | SUBLINGUAL | Status: DC

## 2011-11-19 MED ORDER — SODIUM CHLORIDE 0.9 % IV SOLN: INTRAVENOUS | Status: DC

## 2011-11-19 MED ORDER — ONDANSETRON HCL 4 MG/2ML IJ SOLN: 4.0000 mg | INTRAMUSCULAR | Status: DC | PRN

## 2011-11-19 MED ORDER — TRAZODONE HCL 50 MG PO TABS: 25.0000 mg | ORAL_TABLET | Freq: Every evening | ORAL | Status: DC | PRN

## 2011-11-19 MED ORDER — ACETAMINOPHEN 325 MG PO TABS
650.0000 mg | ORAL_TABLET | ORAL | Status: DC | PRN
  Administered 2011-11-20: 650 mg via ORAL
  Filled 2011-11-19: qty 2

## 2011-11-19 MED ORDER — POLYETHYLENE GLYCOL 3350 17 G PO PACK: 17.0000 g | PACK | Freq: Every day | ORAL | Status: DC | PRN

## 2011-11-19 MED ORDER — IPRATROPIUM BROMIDE 0.02 % IN SOLN
0.5000 mg | Freq: Once | RESPIRATORY_TRACT | Status: DC
  Filled 2011-11-19: qty 2.5

## 2011-11-19 MED ORDER — SODIUM CHLORIDE 0.9 % IV BOLUS (SEPSIS)
250.0000 mL | Freq: Once | INTRAVENOUS | Status: AC
  Administered 2011-11-19: 250 mL via INTRAVENOUS

## 2011-11-19 MED ORDER — TAMSULOSIN HCL 0.4 MG PO CAPS
0.4000 mg | ORAL_CAPSULE | Freq: Every day | ORAL | Status: DC
Start: 2011-11-20 — End: 2011-11-20
  Administered 2011-11-20: 0.4 mg via ORAL
  Filled 2011-11-19: qty 1

## 2011-11-19 MED ORDER — ACETAMINOPHEN 500 MG PO TABS
1000.0000 mg | ORAL_TABLET | Freq: Once | ORAL | Status: AC
  Administered 2011-11-19: 1000 mg via ORAL
  Filled 2011-11-19: qty 2

## 2011-11-19 MED ORDER — AMLODIPINE BESYLATE 5 MG PO TABS
10.0000 mg | ORAL_TABLET | Freq: Every day | ORAL | Status: DC
Start: 2011-11-20 — End: 2011-11-20
  Administered 2011-11-20: 10 mg via ORAL
  Filled 2011-11-19: qty 2

## 2011-11-19 MED ORDER — VANCOMYCIN HCL IN DEXTROSE 1-5 GM/200ML-% IV SOLN
1000.0000 mg | Freq: Once | INTRAVENOUS | Status: AC
  Administered 2011-11-19: 1000 mg via INTRAVENOUS
  Filled 2011-11-19: qty 200

## 2011-11-19 MED ORDER — HYDROCHLOROTHIAZIDE 25 MG PO TABS: 25.0000 mg | ORAL_TABLET | Freq: Every day | ORAL | Status: DC

## 2011-11-19 NOTE — ED Notes (Signed)
cbg 199 en route. Pt c/o tremors all day today. Called [facility] and told to take his humulin shot and wife to watch him. Wife states he is no better so came here. Pt is alert/oriented. Warm to touch. 1-2 plus pitting edema to ankles. Denies n/v/d/sob but appears to have slight access muscle use. ems states pt wheezing. Denies pain. States shaking is better now. Slight tremors noted to upper body

## 2011-11-19 NOTE — ED Provider Notes (Signed)
History     CSN: [id number]  Arrival date & time 11/19/11  1630   First MD Initiated Contact with Patient 11/19/11 1723      Chief Complaint  Patient presents with  . Tremors    (Consider location/radiation/quality/duration/timing/severity/associated sxs/prior treatment) The history is provided by the patient.  the patient is a 78 year old male, primary care provider is Dr. Carlena Sax, presents with what he calls tremors what sound like Reiter's started at 2:30 this afternoon. Associated with nonproductive cough no other symptoms no bodyaches no sore throat no headache,no belly pain no nausea no vomiting no diarrhea. Patient's past medical historysignificant for bladder cancer 67 years ago and frequent kidney and bladder infections but not recently.  Past Medical History  Diagnosis Date  . ASCVD (arteriosclerotic cardiovascular disease)   . Dyspnea   . Dyslipidemia   . DJD (degenerative joint disease)   . Hypertension   . Diabetes mellitus     Past Surgical History  Procedure Date  . Cholecystectomy   . Esophagogastroduodenoscopy 06/08/2002    Soft stricture of the GE  junction with erosive esophagitis. The stricture was dilated to 53 [place] using a Maloney dilator. Swollen fold at GE junction on the gastric site which was biopsied for histology and was suspicious for sentinel folds.  . Colonoscopy 06/08/2002    Small polyp was oblated via cold biopsy from the cecum and two were snared, one was at the rectosigmoid junction measuring about a centimeter, another one at the rectum which was smaller.    History reviewed. No pertinent family history.  History  Substance Use Topics  . Smoking status: Unknown If Ever Smoked  . Smokeless tobacco: Not on file  . Alcohol Use: No      Review of Systems  Constitutional: Positive for fever and chills. Negative for diaphoresis.  HENT: Negative for congestion, sore throat and neck pain.   Eyes: Negative for visual disturbance.    Respiratory: Positive for cough. Negative for shortness of breath.   Cardiovascular: Negative for chest pain.  Gastrointestinal: Negative for nausea, vomiting, abdominal pain and diarrhea.  Genitourinary: Negative for dysuria and hematuria.  Musculoskeletal: Negative for back pain.  Skin: Negative for rash.  Neurological: Positive for tremors. Negative for syncope and headaches.  Hematological: Does not bruise/bleed easily.    Allergies  Celecoxib; Hydrocodone-acetaminophen; Morphine; Niacin; and Piroxicam  Home Medications   Current Outpatient Rx  Name Route Sig Dispense Refill  . AMLODIPINE BESYLATE 10 MG PO TABS Oral Take 10 mg by mouth daily.      Marland Kitchen GEMFIBROZIL 600 MG PO TABS Oral Take 600 mg by mouth 2 (two) times daily.     Marland Kitchen HYDROCHLOROTHIAZIDE 25 MG PO TABS Oral Take 25 mg by mouth daily.      . IBUPROFEN 200 MG PO TABS Oral Take 400 mg by mouth as needed. For pain    . INSULIN ISOPHANE HUMAN 100 UNIT/ML [place] SUSP Subcutaneous Inject 20 Units into the skin 2 (two) times daily.      Marland Kitchen METOPROLOL TARTRATE 50 MG PO TABS Oral Take 50 mg by mouth 2 (two) times daily.      Marland Kitchen NITROGLYCERIN 0.4 MG SL SUBL Sublingual Place 0.4 mg under the tongue as directed.      Marland Kitchen OMEPRAZOLE 20 MG PO CPDR Oral Take 20 mg by mouth daily.      Marland Kitchen TAMSULOSIN HCL 0.4 MG PO CAPS Oral Take 0.4 mg by mouth daily.       BP  141/69  Pulse 65  Temp 99 F (37.2 C) (Oral)  Resp 20  Ht 5\' 8"  (1.727 m)  Wt 243 lb (110.224 kg)  BMI 36.95 kg/m2  SpO2 95%  Physical Exam  Nursing note and vitals reviewed. Constitutional: He is oriented to person, place, and time. He appears well-developed and well-nourished. No distress.  HENT:  Head: Normocephalic and atraumatic.  Mouth/Throat: Oropharynx is clear and moist.  Eyes: Conjunctivae and EOM are normal. Pupils are equal, round, and reactive to light.  Neck: Normal range of motion. Neck supple.  Cardiovascular: Normal rate, regular rhythm and normal heart  sounds.   No murmur heard. Pulmonary/Chest: Effort normal and breath sounds normal. No respiratory distress. He has no wheezes. He has no rales.  Abdominal: Soft. Bowel sounds are normal. There is no tenderness.  Musculoskeletal: Normal range of motion. He exhibits edema. He exhibits no tenderness.  Neurological: He is alert and oriented to person, place, and time. No cranial nerve deficit. He exhibits normal muscle tone. Coordination normal.  Skin: Skin is warm. No rash noted.    ED Course  Procedures (including critical care time)  Labs Reviewed  CBC WITH DIFFERENTIAL - Abnormal; Notable for the following:    WBC 13.5 (*)     Neutrophils Relative 86 (*)     Lymphocytes Relative 7 (*)     Neutro Abs 11.7 (*)     All other components within normal limits  BASIC METABOLIC PANEL - Abnormal; Notable for the following:    Glucose, Bld 274 (*)     BUN 24 (*)     Creatinine, Ser 1.74 (*)     GFR calc non Af Amer 36 (*)     GFR calc Af Amer 42 (*)     All other components within normal limits  URINALYSIS, ROUTINE W REFLEX MICROSCOPIC - Abnormal; Notable for the following:    Color, Urine AMBER (*)  BIOCHEMICALS MAY BE AFFECTED BY COLOR   Glucose, UA 250 (*)     Hgb urine dipstick SMALL (*)     Protein, ur 100 (*)     All other components within normal limits  LIPASE, BLOOD  HEPATIC FUNCTION PANEL  LACTIC ACID, PLASMA  PROCALCITONIN  URINE MICROSCOPIC-ADD ON  CULTURE, BLOOD (ROUTINE X 2)  CULTURE, BLOOD (ROUTINE X 2)  URINE CULTURE   Dg Chest Portable 1 View  11/19/2011  *RADIOLOGY REPORT*  Clinical Data: Tremors.  PORTABLE CHEST - 1 VIEW  Comparison: Chest x-ray 08/27/2009.  Findings: Lung volumes are normal.  No consolidative airspace disease.  No pleural effusions.  Pulmonary vasculature is normal. Heart size is within normal limits. The patient is rotated to the right on today's exam, resulting in distortion of the mediastinal contours and reduced diagnostic sensitivity and  specificity for mediastinal pathology.  IMPRESSION: 1.  No radiographic evidence of acute cardiopulmonary disease.  Original Report Authenticated By: Florencia Reasons, M.D.    Date: 11/19/2011  Rate: 98  Rhythm: normal sinus rhythm  QRS Axis: normal  Intervals: normal  ST/T Wave abnormalities: nonspecific ST/T changes  Conduction Disutrbances:nonspecific intraventricular conduction delay  Narrative Interpretation:   Old EKG Reviewed: none available  Results for orders placed during the hospital encounter of 11/19/11  CBC WITH DIFFERENTIAL      Component Value Range   WBC 13.5 (*) 4.0 - 10.5 K/uL   RBC 5.01  4.22 - 5.81 MIL/uL   Hemoglobin 14.6  13.0 - 17.0 g/dL   HCT 42.5  39.0 - 52.0 %   MCV 84.8  78.0 - 100.0 fL   MCH 29.1  26.0 - 34.0 pg   MCHC 34.4  30.0 - 36.0 g/dL   RDW 16.1  09.6 - 04.5 %   Platelets    150 - 400 K/uL   Value: PLATELET CLUMPS NOTED ON SMEAR, COUNT APPEARS ADEQUATE   Neutrophils Relative 86 (*) 43 - 77 %   Lymphocytes Relative 7 (*) 12 - 46 %   Monocytes Relative 6  3 - 12 %   Eosinophils Relative 1  0 - 5 %   Basophils Relative 0  0 - 1 %   Neutro Abs 11.7 (*) 1.7 - 7.7 K/uL   Lymphs Abs 0.9  0.7 - 4.0 K/uL   Monocytes Absolute 0.8  0.1 - 1.0 K/uL   Eosinophils Absolute 0.1  0.0 - 0.7 K/uL   Basophils Absolute 0.0  0.0 - 0.1 K/uL   WBC Morphology ATYPICAL MONONUCLEAR CELLS    BASIC METABOLIC PANEL      Component Value Range   Sodium 139  135 - 145 mEq/L   Potassium 4.1  3.5 - 5.1 mEq/L   Chloride 102  96 - 112 mEq/L   CO2 24  19 - 32 mEq/L   Glucose, Bld 274 (*) 70 - 99 mg/dL   BUN 24 (*) 6 - 23 mg/dL   Creatinine, Ser 4.09 (*) 0.50 - 1.35 mg/dL   Calcium 9.7  8.4 - 81.1 mg/dL   GFR calc non Af Amer 36 (*) >90 mL/min   GFR calc Af Amer 42 (*) >90 mL/min  LIPASE, BLOOD      Component Value Range   Lipase 28  11 - 59 U/L  HEPATIC FUNCTION PANEL      Component Value Range   Total Protein 6.4  6.0 - 8.3 g/dL   Albumin 3.5  3.5 - 5.2 g/dL    AST 11  0 - 37 U/L   ALT 9  0 - 53 U/L   Alkaline Phosphatase 73  39 - 117 U/L   Total Bilirubin 0.4  0.3 - 1.2 mg/dL   Bilirubin, Direct 0.1  0.0 - 0.3 mg/dL   Indirect Bilirubin 0.3  0.3 - 0.9 mg/dL  URINALYSIS, ROUTINE W REFLEX MICROSCOPIC      Component Value Range   Color, Urine AMBER (*) YELLOW   APPearance CLEAR  CLEAR   Specific Gravity, Urine 1.020  1.005 - 1.030   pH 6.0  5.0 - 8.0   Glucose, UA 250 (*) NEGATIVE mg/dL   Hgb urine dipstick SMALL (*) NEGATIVE   Bilirubin Urine NEGATIVE  NEGATIVE   Ketones, ur NEGATIVE  NEGATIVE mg/dL   Protein, ur 914 (*) NEGATIVE mg/dL   Urobilinogen, UA 0.2  0.0 - 1.0 mg/dL   Nitrite NEGATIVE  NEGATIVE   Leukocytes, UA NEGATIVE  NEGATIVE  LACTIC ACID, PLASMA      Component Value Range   Lactic Acid, Venous 1.2  0.5 - 2.2 mmol/L  PROCALCITONIN      Component Value Range   Procalcitonin <0.10    URINE MICROSCOPIC-ADD ON      Component Value Range   Squamous Epithelial / LPF RARE  RARE   WBC, UA 3-6  <3 WBC/hpf   RBC / HPF 0-2  <3 RBC/hpf   Bacteria, UA RARE  RARE     1. Fever       MDM  Patient with fever and right ears starting at  2:00 Ceftin in her actually to 30 right template 103 7 nonproductive cough workup negative for pneumonia at this point in time white count slightly elevated history of significant urinary tract infections and pyelonephritis the urine only has 3-6 white blood cells. Pre-septic workup was done lactates not elevated protein S was not elevated patient was cultured including urine and blood culture x2 and given Zosyn and vancomycin initially as impaired treatment. Patient has looked better in the emergency department he has a past history of hypertension and diabetes is followed by Dr. Carlena Sax. Discussed with hospitalist they will admit and observe overnight to the MedSurg floor. EKG here was not sinus tach no significant abnormalities.        Shelda Jakes, MD 11/19/11 2149

## 2011-11-20 ENCOUNTER — Inpatient Hospital Stay (HOSPITAL_COMMUNITY): Payer: PRIVATE HEALTH INSURANCE

## 2011-11-20 ENCOUNTER — Encounter (HOSPITAL_COMMUNITY): Payer: Self-pay | Admitting: Internal Medicine

## 2011-11-20 DIAGNOSIS — L03115 Cellulitis of right lower limb: Secondary | ICD-10-CM | POA: Diagnosis present

## 2011-11-20 DIAGNOSIS — N184 Chronic kidney disease, stage 4 (severe): Secondary | ICD-10-CM | POA: Diagnosis present

## 2011-11-20 DIAGNOSIS — N189 Chronic kidney disease, unspecified: Secondary | ICD-10-CM

## 2011-11-20 DIAGNOSIS — I10 Essential (primary) hypertension: Secondary | ICD-10-CM

## 2011-11-20 DIAGNOSIS — L03119 Cellulitis of unspecified part of limb: Secondary | ICD-10-CM

## 2011-11-20 DIAGNOSIS — L02419 Cutaneous abscess of limb, unspecified: Secondary | ICD-10-CM

## 2011-11-20 DIAGNOSIS — E785 Hyperlipidemia, unspecified: Secondary | ICD-10-CM

## 2011-11-20 LAB — CBC
Hemoglobin: 13.7 g/dL (ref 13.0–17.0)
RBC: 4.66 MIL/uL (ref 4.22–5.81)
WBC: 10.5 10*3/uL (ref 4.0–10.5)

## 2011-11-20 LAB — GLUCOSE, CAPILLARY: Glucose-Capillary: 116 mg/dL — ABNORMAL HIGH (ref 70–99)

## 2011-11-20 LAB — BASIC METABOLIC PANEL
GFR calc Af Amer: 41 mL/min — ABNORMAL LOW (ref >90)
GFR calc non Af Amer: 35 mL/min — ABNORMAL LOW (ref >90)
Glucose, Bld: 125 mg/dL — ABNORMAL HIGH (ref 70–99)
Potassium: 3.5 mEq/L (ref 3.5–5.1)
Sodium: 140 mEq/L (ref 135–145)

## 2011-11-20 LAB — D-DIMER, QUANTITATIVE: D-Dimer, Quant: 0.28 ug/mL-FEU (ref 0.00–0.48)

## 2011-11-20 MED ORDER — VANCOMYCIN HCL IN DEXTROSE 1-5 GM/200ML-% IV SOLN
1000.0000 mg | Freq: Once | INTRAVENOUS | Status: AC
  Administered 2011-11-20: 1000 mg via INTRAVENOUS
  Filled 2011-11-20: qty 200

## 2011-11-20 MED ORDER — PIPERACILLIN-TAZOBACTAM 3.375 G IVPB
3.3750 g | Freq: Three times a day (TID) | INTRAVENOUS | Status: DC
  Administered 2011-11-20: 3.375 g via INTRAVENOUS
  Filled 2011-11-20 (×11): qty 50

## 2011-11-20 MED ORDER — DOXYCYCLINE HYCLATE 100 MG PO TABS: 100.0000 mg | ORAL_TABLET | Freq: Two times a day (BID) | ORAL | Status: AC

## 2011-11-20 MED ORDER — VANCOMYCIN HCL 1000 MG IV SOLR
1500.0000 mg | INTRAVENOUS | Status: DC
  Filled 2011-11-20 (×3): qty 1500

## 2011-11-20 MED ORDER — PIPERACILLIN-TAZOBACTAM 3.375 G IVPB
INTRAVENOUS | Status: AC
  Filled 2011-11-20: qty 50

## 2011-11-20 MED ORDER — INSULIN NPH (HUMAN) (ISOPHANE) 100 UNIT/ML ~~LOC~~ SUSP
SUBCUTANEOUS | Status: AC
  Filled 2011-11-20: qty 1

## 2011-11-20 NOTE — Progress Notes (Signed)
UR Chart Review Completed  

## 2011-11-20 NOTE — Discharge Instructions (Signed)

## 2011-11-20 NOTE — Progress Notes (Signed)
When patient came up to the floor at 2300. Patient's right leg was slightly red, but patient was complaining of no pain and there was bilateral swelling in the legs. Nurse monitored the leg. At 0345, patient began to complain of a 8 out of 10 pain in the right leg. The whole lower part of the leg was red and warm to the touch. Doctor was made aware and doctor came to assess patient. Will continue to monitor.

## 2011-11-20 NOTE — Care Management Note (Signed)
    Page 1 of 1   11/20/2011     1:17:18 PM   CARE MANAGEMENT NOTE 11/20/2011  Patient:  Craig Castillo, Craig Castillo   Account Number:  [id number]  Date Initiated:  11/20/2011  Documentation initiated by:  Rosemary Holms  Subjective/Objective Assessment:   Pt admitted with tremors and r. leg cellulitis. Lives at home with wife and independent with ADL.     Action/Plan:   Spoke with Pt and wife at bedside. Both eager for pt to be discharged. Reviewed HH and they are open to this if needed at discharged.   Anticipated DC Date:  11/20/2011   Anticipated DC Plan:  HOME/SELF CARE      DC Planning Services  CM consult      Choice offered to / List presented to:             Status of service:  Completed, signed off Medicare Important Message given?   (If response is "NO", the following Medicare IM given date fields will be blank) Date Medicare IM given:   Date Additional Medicare IM given:    Discharge Disposition:  HOME/SELF CARE  Per UR Regulation:    If discussed at Long Length of Stay Meetings, dates discussed:    Comments:  11/20/11 1000 Penne Rosenstock Leanord Hawking RN BSN CM 1300 Rosemary Holms RN BSN CM Pt DC, no HH needs identified.

## 2011-11-20 NOTE — Progress Notes (Addendum)
ANTIBIOTIC CONSULT NOTE  Pharmacy Consult for Zosyn & Vancomcyin Indication: cellulitis  Allergies  Allergen Reactions  . Celecoxib Swelling  . Hydrocodone-Acetaminophen Itching  . Morphine Itching  . Niacin Itching  . Piroxicam Itching    Patient Measurements: Height: 5\' 8"  (172.7 cm) Weight: 237 lb 12.8 oz (107.865 kg) IBW/kg (Calculated) : 68.4  Adjusted Body Weight: 82 kg  Vital Signs: Temp: 98.3 F (36.8 C) (06/28 0545) Temp src: Oral (06/27 2251) BP: 157/73 mmHg (06/28 0545) Pulse Rate: 59  (06/28 0718) Intake/Output from previous day: 06/27 0701 - 06/28 0700 In: 238.8 [I.V.:238.8] Out: -  Intake/Output from this shift:    Labs:  Basename 11/20/11 0521 11/19/11 1652  WBC 10.5 13.5*  HGB 13.7 14.6  PLT 178 PLATELET CLUMPS NOTED ON SMEAR, COUNT APPEARS ADEQUATE  LABCREA -- --  CREATININE 1.79* 1.74*   Estimated Creatinine Clearance: 41.8 ml/min (by C-G formula based on Cr of 1.79).   Microbiology: No results found for this or any previous visit (from the past 720 hour(s)).  Medical History: Past Medical History  Diagnosis Date  . ASCVD (arteriosclerotic cardiovascular disease)   . Dyspnea   . Dyslipidemia   . DJD (degenerative joint disease)   . Hypertension   . Diabetes mellitus     Assessment: 78 yo diabetic M admit with cellulitis. He has been started on empiric, broad-spectrum antibiotics with Vancomycin and Zosyn.     He has chronic renal insufficiency.  Goal of Therapy:  Vancomycin trough 10-15 mcg/ml  Plan:  1) Zosyn 3.375gm IV Q8h to be infused over 4hrs 2) Give an additional 1gm Vancomycin now, then 1500mg  IV Q24h. 3) Check Vancomycin trough at steady state 4) Monitor renal function and cx data    Elson Clan 11/20/2011,9:15 AM

## 2011-11-20 NOTE — Discharge Summary (Signed)
Physician Discharge Summary  Patient ID: Craig Castillo MRN: [id number] DOB/AGE: 26-Jul-1932 78 y.o.  Admit date: 11/19/2011 Discharge date: 11/20/2011  Primary Care Physician:  Cassell Smiles., MD   Discharge Diagnoses:    Principal Problem:  *Cellulitis of right leg Active Problems:  CARCINOMA, BLADDER  DYSLIPIDEMIA  HYPERTENSION  ATHEROSCLEROTIC CARDIOVASCULAR DISEASE  Fever  Rigors  Obesity  CKD (chronic kidney disease)    Medication List  As of 11/20/2011  1:00 PM   STOP taking these medications         hydrochlorothiazide 25 MG tablet      ibuprofen 200 MG tablet         TAKE these medications         amLODipine 10 MG tablet   Commonly known as: NORVASC   Take 10 mg by mouth daily.      doxycycline 100 MG tablet   Commonly known as: VIBRA-TABS   Take 1 tablet (100 mg total) by mouth 2 (two) times daily.      gemfibrozil 600 MG tablet   Commonly known as: LOPID   Take 600 mg by mouth 2 (two) times daily.      insulin NPH 100 UNIT/ML injection   Commonly known as: HUMULIN N,NOVOLIN N   Inject 20 Units into the skin 2 (two) times daily.      metoprolol 50 MG tablet   Commonly known as: LOPRESSOR   Take 50 mg by mouth 2 (two) times daily.      nitroGLYCERIN 0.4 MG SL tablet   Commonly known as: NITROSTAT   Place 0.4 mg under the tongue as directed.      omeprazole 20 MG capsule   Commonly known as: PRILOSEC   Take 20 mg by mouth daily.      Tamsulosin HCl 0.4 MG Caps   Commonly known as: FLOMAX   Take 0.4 mg by mouth daily.           Discharge exam: Blood pressure 171/77, pulse 64, temperature 98.3 F (36.8 C), temperature source Oral, resp. rate 20, height 5\' 8"  (1.727 m), weight 107.865 kg (237 lb 12.8 oz), SpO2 97.00%. NAD CTA B S1, S2, RRR Soft, NT, BS+ RLE is erythematous  Disposition and Follow-up:  Follow up with primary care doctor in 1 week  Consults:  none   Significant Diagnostic Studies:  Dg Chest Portable 1  View  11/19/2011  *RADIOLOGY REPORT*  Clinical Data: Tremors.  PORTABLE CHEST - 1 VIEW  Comparison: Chest x-ray 08/27/2009.  Findings: Lung volumes are normal.  No consolidative airspace disease.  No pleural effusions.  Pulmonary vasculature is normal. Heart size is within normal limits. The patient is rotated to the right on today's exam, resulting in distortion of the mediastinal contours and reduced diagnostic sensitivity and specificity for mediastinal pathology.  IMPRESSION: 1.  No radiographic evidence of acute cardiopulmonary disease.  Original Report Authenticated By: Florencia Reasons, M.D.    Brief H and P: For complete details please refer to admission H and P, but in brief Craig Castillo is an 78 y.o. male. Obese Caucasian gentleman with a history of diabetes hyperlipidemia coronary artery disease and hypertension, was in fair base line status health until about 2:30 this afternoon he started having generalize shaking of his body associated with muscle aches.  He denied any subjective fever or headache, or sore throat, denies nausea vomiting diarrhea, dysuria, frequency, or joint pains.  He has had no mental status change.  Has  been having a dry cough for a few days.  He came to the emergency room for the shaking there was found to have at temperature 103 and a leukocytosis.  No cause of the fever could be found the hospitalist service was called to assist with management.  He is monitored by Dr. Omelia Blackwater at six-month intervals for bladder cancer diagnosed some 11 years ago.   Hospital Course:   This gentleman was admitted with fever, generalized muscle aches. He was found to have a right lower extremity cellulitis. He was started on empiric IV antibiotics. D-dimer was checked and was found to be normal. Since his admission he has been afebrile. He reports significant improvement and is requesting discharge home. We will transition him to oral antibiotics with doxycycline to  complete a course. He's been advised to keep his leg elevated. He will followup with his primary care doctor in one week for reevaluation of his leg. The remainder of his past medical issues have been stable.  Time spent on Discharge:  Signed: Orvill Coulthard [facility] Pager: [phone] 11/20/2011, 1:00 PM

## 2011-11-20 NOTE — Progress Notes (Signed)
ANTIBIOTIC CONSULT NOTE - INITIAL  Pharmacy Consult for Zosyn Indication: right leg cellulitis  Allergies  Allergen Reactions  . Celecoxib Swelling  . Hydrocodone-Acetaminophen Itching  . Morphine Itching  . Niacin Itching  . Piroxicam Itching    Patient Measurements: Height: 5\' 8"  (172.7 cm) Weight: 238 lb 5.1 oz (108.1 kg) IBW/kg (Calculated) : 68.4  Adjusted Body Weight: 82 kg  Vital Signs: Temp: 98.5 F (36.9 C) (06/28 0234) Temp src: Oral (06/27 2251) BP: 141/79 mmHg (06/28 0234) Pulse Rate: 65  (06/28 0234) Intake/Output from previous day: 06/27 0701 - 06/28 0700 In: 238.8 [I.V.:238.8] Out: -  Intake/Output from this shift: Total I/O In: 238.8 [I.V.:238.8] Out: -   Labs:  Basename 11/19/11 1652  WBC 13.5*  HGB 14.6  PLT PLATELET CLUMPS NOTED ON SMEAR, COUNT APPEARS ADEQUATE  LABCREA --  CREATININE 1.74*   Estimated Creatinine Clearance: 43.1 ml/min (by C-G formula based on Cr of 1.74).   Microbiology: No results found for this or any previous visit (from the past 720 hour(s)).  Medical History: Past Medical History  Diagnosis Date  . ASCVD (arteriosclerotic cardiovascular disease)   . Dyspnea   . Dyslipidemia   . DJD (degenerative joint disease)   . Hypertension   . Diabetes mellitus     Medications:     Vancomycin 1gm IV x 1     Zosyn 3.375 gm IV q8h  Assessment:    Coverage of soft tissue infection with probable cellulitis of right leg complicated by poor circulation.  Patient with est CrCl >20-10ml/min so will dose Zosyn 3.375gm IV q8h.  Clinical Pharmacist to follow in morning  Goal of Therapy:    Plan:  See Roxy Horseman 11/20/2011,4:37 AM

## 2011-11-20 NOTE — H&P (Addendum)
PCP:   Cassell Smiles., MD   Chief Complaint:  Tremors since this afternoon  HPI: Craig Castillo is an 78 y.o. male.   Obese Caucasian gentleman with a history of diabetes hyperlipidemia coronary artery disease and hypertension, was in fair base line status health until about 2:30 this afternoon he started having generalize shaking of his body associated with muscle aches.   He denied any subjective fever or headache,  or sore throat, denies nausea vomiting diarrhea, dysuria, frequency, or joint pains. He has had no mental status change.  Has been having a dry cough for a few days.  He came to the emergency room for the shaking there was found to have at temperature 103 and a leukocytosis. No cause of the fever could be found the hospitalist service was called to assist with management.  He is monitored by Dr. Omelia Blackwater at six-month intervals for bladder cancer diagnosed some 11 years ago.  Rewiew of Systems:  The patient denies anorexia, weight loss,, vision loss, decreased hearing, hoarseness, chest pain, syncope, dyspnea on exertion, peripheral edema, balance deficits, hemoptysis, abdominal pain, melena, hematochezia, severe indigestion/heartburn, hematuria, incontinence, genital sores, muscle weakness, suspicious skin lesions, transient blindness, difficulty walking, depression, unusual weight change, abnormal bleeding, enlarged lymph nodes, angioedema, and breast masses.   Past Medical History  Diagnosis Date  . ASCVD (arteriosclerotic cardiovascular disease)   . Dyspnea   . Dyslipidemia   . DJD (degenerative joint disease)   . Hypertension   . Diabetes mellitus     Past Surgical History  Procedure Date  . Cholecystectomy   . Esophagogastroduodenoscopy 06/08/2002    Soft stricture of the GE  junction with erosive esophagitis. The stricture was dilated to 40 [place] using a Maloney dilator. Swollen fold at GE junction on the gastric site which was biopsied for  histology and was suspicious for sentinel folds.  . Colonoscopy 06/08/2002    Small polyp was oblated via cold biopsy from the cecum and two were snared, one was at the rectosigmoid junction measuring about a centimeter, another one at the rectum which was smaller.    Medications:  HOME MEDS: Prior to Admission medications   Medication Sig Start Date End Date Taking? Authorizing Provider  amLODipine (NORVASC) 10 MG tablet Take 10 mg by mouth daily.     Yes Historical Provider, MD  gemfibrozil (LOPID) 600 MG tablet Take 600 mg by mouth 2 (two) times daily.    Yes Historical Provider, MD  hydrochlorothiazide (HYDRODIURIL) 25 MG tablet Take 25 mg by mouth daily.     Yes Historical Provider, MD  ibuprofen (ADVIL,MOTRIN) 200 MG tablet Take 400 mg by mouth as needed. For pain   Yes Historical Provider, MD  insulin NPH (HUMULIN N,NOVOLIN N) 100 UNIT/ML injection Inject 20 Units into the skin 2 (two) times daily.     Yes Historical Provider, MD  metoprolol (LOPRESSOR) 50 MG tablet Take 50 mg by mouth 2 (two) times daily.     Yes Historical Provider, MD  nitroGLYCERIN (NITROSTAT) 0.4 MG SL tablet Place 0.4 mg under the tongue as directed.     Yes Historical Provider, MD  omeprazole (PRILOSEC) 20 MG capsule Take 20 mg by mouth daily.     Yes Historical Provider, MD  Tamsulosin HCl (FLOMAX) 0.4 MG CAPS Take 0.4 mg by mouth daily.    Yes Historical Provider, MD     Allergies:  Allergies  Allergen Reactions  . Celecoxib Swelling  . Hydrocodone-Acetaminophen Itching  . Morphine  Itching  . Niacin Itching  . Piroxicam Itching    Social History:   reports that he has never smoked. He does not have any smokeless tobacco history on file. He reports that he does not drink alcohol or use illicit drugs.  Family History: Family History  Problem Relation Age of Onset  . Stroke Mother 53  . Diabetes Brother 35     Physical Exam: Filed Vitals:   11/19/11 2010 11/19/11 2149 11/19/11 2251 11/19/11  2336  BP: 141/69 142/65 171/78   Pulse: 65 71 76   Temp: 99 F (37.2 C) 99.2 F (37.3 C) 99.7 F (37.6 C)   TempSrc: Oral Oral Oral   Resp: 20 20 20    Height:   5\' 8"  (1.727 m)   Weight:   108.1 kg (238 lb 5.1 oz)   SpO2: 95% 97% 96% 95%   Blood pressure 171/78, pulse 76, temperature 99.7 F (37.6 C), temperature source Oral, resp. rate 20, height 5\' 8"  (1.727 m), weight 108.1 kg (238 lb 5.1 oz), SpO2 95.00%.  GEN:  Pleasant elderly obese Caucasian gentleman lying in the stretcher; cooperative with exam PSYCH:  alert and oriented x4; does not appear anxious or depressed; affect is appropriate. HEENT: Mucous membranes pink and anicteric; PERRLA; EOM intact; no cervical lymphadenopathy nor thyromegaly or carotid bruit; no JVD; Breasts:: Not examined CHEST WALL: No tenderness CHEST: Normal respiration, clear to auscultation bilaterally HEART: Regular rate and rhythm; no murmurs rubs or gallops BACK:  no CVA tenderness ABDOMEN: Obese, soft non-tender; no masses, no organomegaly, normal abdominal bowel sounds; no intertriginous candida. Rectal Exam: Not done EXTREMITIES: age-appropriate arthropathy of the hands and knees; no edema; no calf tenderness no ulcerations. Cracked soles; no inflammation.  Genitalia: not examined PULSES: 2+ and symmetric SKIN: Normal hydration no rash or ulceration CNS: Cranial nerves 2-12 grossly intact no focal lateralizing neurologic deficit   Labs & Imaging Results for orders placed during the hospital encounter of 11/19/11 (from the past 48 hour(s))  CBC WITH DIFFERENTIAL     Status: Abnormal   Collection Time   11/19/11  4:52 PM      Component Value Range Comment   WBC 13.5 (*) 4.0 - 10.5 K/uL    RBC 5.01  4.22 - 5.81 MIL/uL    Hemoglobin 14.6  13.0 - 17.0 g/dL    HCT 65.7  84.6 - 96.2 %    MCV 84.8  78.0 - 100.0 fL    MCH 29.1  26.0 - 34.0 pg    MCHC 34.4  30.0 - 36.0 g/dL    RDW 95.2  84.1 - 32.4 %    Platelets    150 - 400 K/uL PLATELET  CLUMPING, SUGGEST RECOLLECTION OF SAMPLE IN CITRATE TUBE.   Value: PLATELET CLUMPS NOTED ON SMEAR, COUNT APPEARS ADEQUATE   Neutrophils Relative 86 (*) 43 - 77 %    Lymphocytes Relative 7 (*) 12 - 46 %    Monocytes Relative 6  3 - 12 %    Eosinophils Relative 1  0 - 5 %    Basophils Relative 0  0 - 1 %    Neutro Abs 11.7 (*) 1.7 - 7.7 K/uL    Lymphs Abs 0.9  0.7 - 4.0 K/uL    Monocytes Absolute 0.8  0.1 - 1.0 K/uL    Eosinophils Absolute 0.1  0.0 - 0.7 K/uL    Basophils Absolute 0.0  0.0 - 0.1 K/uL    WBC Morphology ATYPICAL MONONUCLEAR CELLS  BASIC METABOLIC PANEL     Status: Abnormal   Collection Time   11/19/11  4:52 PM      Component Value Range Comment   Sodium 139  135 - 145 mEq/L    Potassium 4.1  3.5 - 5.1 mEq/L    Chloride 102  96 - 112 mEq/L    CO2 24  19 - 32 mEq/L    Glucose, Bld 274 (*) 70 - 99 mg/dL    BUN 24 (*) 6 - 23 mg/dL    Creatinine, Ser 0.98 (*) 0.50 - 1.35 mg/dL    Calcium 9.7  8.4 - 11.9 mg/dL    GFR calc non Af Amer 36 (*) >90 mL/min    GFR calc Af Amer 42 (*) >90 mL/min   URINALYSIS, ROUTINE W REFLEX MICROSCOPIC     Status: Abnormal   Collection Time   11/19/11  5:30 PM      Component Value Range Comment   Color, Urine AMBER (*) YELLOW BIOCHEMICALS MAY BE AFFECTED BY COLOR   APPearance CLEAR  CLEAR    Specific Gravity, Urine 1.020  1.005 - 1.030    pH 6.0  5.0 - 8.0    Glucose, UA 250 (*) NEGATIVE mg/dL    Hgb urine dipstick SMALL (*) NEGATIVE    Bilirubin Urine NEGATIVE  NEGATIVE    Ketones, ur NEGATIVE  NEGATIVE mg/dL    Protein, ur 147 (*) NEGATIVE mg/dL    Urobilinogen, UA 0.2  0.0 - 1.0 mg/dL    Nitrite NEGATIVE  NEGATIVE    Leukocytes, UA NEGATIVE  NEGATIVE   URINE MICROSCOPIC-ADD ON     Status: Normal   Collection Time   11/19/11  5:30 PM      Component Value Range Comment   Squamous Epithelial / LPF RARE  RARE    WBC, UA 3-6  <3 WBC/hpf    RBC / HPF 0-2  <3 RBC/hpf    Bacteria, UA RARE  RARE   LIPASE, BLOOD     Status: Normal    Collection Time   11/19/11  5:39 PM      Component Value Range Comment   Lipase 28  11 - 59 U/L   HEPATIC FUNCTION PANEL     Status: Normal   Collection Time   11/19/11  5:39 PM      Component Value Range Comment   Total Protein 6.4  6.0 - 8.3 g/dL    Albumin 3.5  3.5 - 5.2 g/dL    AST 11  0 - 37 U/L    ALT 9  0 - 53 U/L    Alkaline Phosphatase 73  39 - 117 U/L    Total Bilirubin 0.4  0.3 - 1.2 mg/dL    Bilirubin, Direct 0.1  0.0 - 0.3 mg/dL    Indirect Bilirubin 0.3  0.3 - 0.9 mg/dL   LACTIC ACID, PLASMA     Status: Normal   Collection Time   11/19/11  5:39 PM      Component Value Range Comment   Lactic Acid, Venous 1.2  0.5 - 2.2 mmol/L   PROCALCITONIN     Status: Normal   Collection Time   11/19/11  5:40 PM      Component Value Range Comment   Procalcitonin <0.10     MAGNESIUM     Status: Normal   Collection Time   11/19/11 11:02 PM      Component Value Range Comment   Magnesium 1.6  1.5 -  2.5 mg/dL    Dg Chest Portable 1 View  11/19/2011  *RADIOLOGY REPORT*  Clinical Data: Tremors.  PORTABLE CHEST - 1 VIEW  Comparison: Chest x-ray 08/27/2009.  Findings: Lung volumes are normal.  No consolidative airspace disease.  No pleural effusions.  Pulmonary vasculature is normal. Heart size is within normal limits. The patient is rotated to the right on today's exam, resulting in distortion of the mediastinal contours and reduced diagnostic sensitivity and specificity for mediastinal pathology.  IMPRESSION: 1.  No radiographic evidence of acute cardiopulmonary disease.  Original Report Authenticated By: Florencia Reasons, M.D.      Assessment Present on Admission:  .Fever .Rigors .HYPERTENSION .ATHEROSCLEROTIC CARDIOVASCULAR DISEASE .DYSLIPIDEMIA .CARCINOMA, BLADDER .Obesity CKD   PLAN:  This gentleman has a significant fever with rigors; although patient was already given a dose of vancomycin and Zosyn in the emergency room, because we have no clear focus of infection bring  this gentleman on observation without further antibiotics. We'll wait for source of infection to declare itself while we continue to cheat his chronic medical conditions, and await the results of his blood and urine cultures.  This gentleman creatinine has been persistently elevated over the years and he likely has some degree of the kidney disease. We will discontinue ibuprofen due to elevated creatinine, and will also discontinue HCTZ.  Other plans as per orders.   Craig Castillo 11/20/2011, 1:44 AM   addendum   at about 4 AM the patient developed pain and redness of the right leg; the leg was examined and noted to have blotchy erythema and tenderness; there was no marked edema.  Plan  Will change to inpatient status, start treatment for right leg cellulitis with Zosyn, check a d-dimer and if positive consider ultrasound to rule out DVT.   Craig Castillo 11/20/2011 5:38 AM

## 2011-11-20 NOTE — Progress Notes (Signed)
Inpatient Diabetes Program Recommendations  AACE/ADA: New Consensus Statement on Inpatient Glycemic Control  Target Ranges:  Prepandial:   less than 140 mg/dL      Peak postprandial:   less than 180 mg/dL (1-2 hours)      Critically ill patients:  140 - 180 mg/dL  Pager:  [phone] Hours:  8 am-10pm   Reason for Visit: Patient on NPH  Inpatient Diabetes Program Recommendations Correction (SSI): Order CBGs while on NPH   Alfredia Client PhD, RN Diabetes Coordinator  Office:  [phone] Team Pager:  [phone]

## 2011-11-20 NOTE — Progress Notes (Signed)
Discharge instructions reviewed with patient and his wife, both voiced understanding. Patient given discharge instructions and prescriptions. Patient in stable condition and transported out by tech.

## 2011-11-21 LAB — URINE CULTURE: Colony Count: 10000

## 2011-11-24 LAB — CULTURE, BLOOD (ROUTINE X 2): Culture: NO GROWTH

## 2012-03-08 ENCOUNTER — Encounter (HOSPITAL_COMMUNITY)
Admission: RE | Admit: 2012-03-08 | Discharge: 2012-03-08 | Disposition: A | Discharge status: Home / Self Care | Payer: PRIVATE HEALTH INSURANCE | Source: Ambulatory Visit | Attending: Urology | Admitting: Urology

## 2012-03-08 ENCOUNTER — Encounter (HOSPITAL_COMMUNITY): Payer: Self-pay

## 2012-03-08 ENCOUNTER — Encounter (HOSPITAL_COMMUNITY): Payer: Self-pay | Admitting: Pharmacy Technician

## 2012-03-08 HISTORY — DX: Benign prostatic hyperplasia without lower urinary tract symptoms: N40.0

## 2012-03-08 HISTORY — DX: Acute myocardial infarction, unspecified: I21.9

## 2012-03-08 LAB — SURGICAL PCR SCREEN: MRSA, PCR: NEGATIVE

## 2012-03-08 LAB — BASIC METABOLIC PANEL
BUN: 23 mg/dL (ref 6–23)
Calcium: 9.2 mg/dL (ref 8.4–10.5)
GFR calc Af Amer: 40 mL/min — ABNORMAL LOW (ref >90)
GFR calc non Af Amer: 35 mL/min — ABNORMAL LOW (ref >90)
Glucose, Bld: 187 mg/dL — ABNORMAL HIGH (ref 70–99)
Potassium: 4.3 mEq/L (ref 3.5–5.1)

## 2012-03-08 NOTE — Patient Instructions (Addendum)
20 Craig Castillo  03/08/2012   Your procedure is scheduled on:   03/15/2012  Report to [facility] at  730  AM.  Call this number if you have problems the morning of surgery: [phone]   Remember:   Do not eat food:After Midnight.  May have clear liquids:until Midnight .    Take these medicines the morning of surgery with A SIP OF WATER:  Amlodipine, metoprolol,omeprazole   Do not wear jewelry, make-up or nail polish.  Do not wear lotions, powders, or perfumes. You may wear deodorant.  Do not shave 48 hours prior to surgery. Men may shave face and neck.  Do not bring valuables to the hospital.  Contacts, dentures or bridgework may not be worn into surgery.  Leave suitcase in the car. After surgery it may be brought to your room.  For patients admitted to the hospital, checkout time is 11:00 AM the day of discharge.   Patients discharged the day of surgery will not be allowed to drive home.  Name and phone number of your driver: family  Special Instructions: Shower using CHG 2 nights before surgery and the night before surgery.  If you shower the day of surgery use CHG.  Use special wash - you have one bottle of CHG for all showers.  You should use approximately 1/3 of the bottle for each shower.   Please read over the following fact sheets that you were given: Pain Booklet, MRSA Information, Surgical Site Infection Prevention, Anesthesia Post-op Instructions and Care and Recovery After Surgery Transurethral Resection, Bladder Tumor A cancerous growth (tumor) can develop on the inside wall of the bladder. The bladder is the organ that holds urine. One way to remove the tumor is a procedure called a transurethral resection. The tumor is removed (resected) through the tube that carries urine from the bladder out of the body (urethra). No cuts (incisions) are made in the skin. Instead, the procedure is done through a thin telescope, called a resectoscope. Attached to it is a light and  usually a tiny camera. The resectoscope is put into the urethra. In men, the urethra opens at the end of the penis. In women, it opens just above the vagina.  A transurethral resection is usually used to remove tumors that have not gotten too big or too deep. These are called Stage 0, Stage 1 or Stage 2 bladder cancers. LET YOUR CAREGIVER KNOW ABOUT:  On the day of the procedure, your caregivers will need to know the last time you had anything to eat or drink. This includes water, gum, and candy. In advance, make sure they know about:   Any allergies.  All medications you are taking, including:  Herbs, eyedrops, over-the-counter medications and creams.  Blood thinners (anticoagulants), aspirin or other drugs that could affect blood clotting.  Use of steroids (by mouth or as creams).  Previous problems with anesthetics, including local anesthetics.  Possibility of pregnancy, if this applies.  Any history of blood clots.  Any history of bleeding or other blood problems.  Previous surgery.  Smoking history.  Any recent symptoms of colds or infections.  Other health problems. RISKS AND COMPLICATIONS This is usually a safe procedure. Every procedure has risks, though. For a transurethral resection, they include:  Infection. Antibiotic medication would need to be taken.  Bleeding.  Light bleeding may last for several days after the procedure.  If bleeding continues or is heavy, the bladder may need rinsing. Or, a  new catheter might be put in for awhile.  Sometimes bed rest is needed.  Urination problems.  Pain and burning can occur when urinating. This usually goes away in a few days.  Scarring from the procedure can block the flow of urine.  Bladder damage.  It can be punctured or torn during removal of the tumor. If this happens, a catheter might be needed for longer. Antibiotics would be taken while the bladder heals.  Urine can leak through the hole or tear into the  abdomen. If this happens, surgery may be needed to repair the bladder. BEFORE THE PROCEDURE   A medical evaluation will be done. This may include:  A physical examination.  Urine test. This is to make sure you do not have a urinary tract infection.  Blood tests.  A test that checks the heart's rhythm (electrocardiogram).  Talking with an anesthesiologist. This is the person who will be in charge of the medication (anesthesia) to keep you from feeling pain during the transurethral resection. You might be asleep during the procedure (general anesthesia) or numb from the waist down, but awake during the procedure (spinal anesthesia). Ask your surgeon what to expect.  The person who is having a transurethral resection needs to give what is called informed consent. This requires signing a legal paper that gives permission for the procedure. To give informed consent:  You must understand how the procedure is done and why.  You must be told all the risks and benefits of the procedure.  You must sign the consent. Sometimes a legal guardian can do this.  Signing should be witnessed by a healthcare professional.  The day before the surgery, eat only a light dinner. Then, do not eat or drink anything for at least 8 hours before the surgery. Ask your caregiver if it is OK to take any needed medicines with a sip of water.  Arrive at least an hour before the surgery or whenever your surgeon recommends. This will give you time to check in and fill out any needed paperwork. PROCEDURE  The preparation:  You will change into a hospital gown.  A needle will be inserted in your arm. This is an intravenous access tube (IV). Medication will be able to flow directly into your body through this needle.  Small monitors will be put on your body. They are used to check your heart, blood pressure, and oxygen level.  You might be given medication that will help you relax (sedative).  You will be given a  general anesthetic or spinal anesthesia.  The procedure:  Once you are asleep or numb from the waist down, your legs will be placed in stirrups.  The resectoscope will be passed through the urethra into the bladder.  Fluid will be passed through the resectoscope. This will fill the bladder with water.  The surgeon will examine the bladder through the scope. If the scope has a camera, it can take pictures from inside the bladder. They can be projected onto a TV screen.  The surgeon will use various tools to remove the tumor in small pieces. Sometimes a laser (a beam of light energy) is used. Other tools may use electric current.  A tube (catheter) will often be placed so that urine can drain into a bag outside the body. This process helps stop bleeding. This tube keeps blood clots from blocking the urethra.  The procedure usually takes 30 to 45 minutes. AFTER THE PROCEDURE   You will stay in  a recovery area until the anesthesia has worn off. Your blood pressure and pulse will be checked every so often. Then you will be taken to a hospital room.  You may continue to get fluids through the IV for awhile.  Some pain is normal. The catheter might be uncomfortable. Pain is usually not severe. If it is, ask for pain medicine.  Your urine may look bloody after a transurethral resection. This is normal.  If bleeding is heavy, a hospital caregiver may rinse out the bladder (irrigation) through the catheter.  Once the urine is clear, the catheter will be taken out.  You will need to stay in the hospital until you can urinate on your own.  Most people stay in the hospital for up to 4 days. PROGNOSIS   Transurethral resection is considered the best way to treat bladder tumors that are not too far along. For most people, the treatment is successful. Sometimes, though, more treatment is needed.  Bladder cancers can come back even after a successful procedure. Because of this, be sure to have a  checkup with your caregiver every 3 to 6 months. If everything is OK for 3 years, you can reduce the checkups to once a year. Document Released: 03/07/2009 Document Revised: 08/03/2011 Document Reviewed: 03/07/2009 [facility] Patient Information 2013 [place], [place]. PATIENT INSTRUCTIONS POST-ANESTHESIA  IMMEDIATELY FOLLOWING SURGERY:  Do not drive or operate machinery for the first twenty four hours after surgery.  Do not make any important decisions for twenty four hours after surgery or while taking narcotic pain medications or sedatives.  If you develop intractable nausea and vomiting or a severe headache please notify your doctor immediately.  FOLLOW-UP:  Please make an appointment with your surgeon as instructed. You do not need to follow up with anesthesia unless specifically instructed to do so.  WOUND CARE INSTRUCTIONS (if applicable):  Keep a dry clean dressing on the anesthesia/puncture wound site if there is drainage.  Once the wound has quit draining you may leave it open to air.  Generally you should leave the bandage intact for twenty four hours unless there is drainage.  If the epidural site drains for more than 36-48 hours please call the anesthesia department.  QUESTIONS?:  Please feel free to call your physician or the hospital operator if you have any questions, and they will be happy to assist you.

## 2012-03-09 MED ORDER — MUPIROCIN 2 % EX OINT
TOPICAL_OINTMENT | CUTANEOUS | Status: AC
  Filled 2012-03-09: qty 22

## 2012-03-11 ENCOUNTER — Emergency Department (HOSPITAL_COMMUNITY): Payer: PRIVATE HEALTH INSURANCE

## 2012-03-11 ENCOUNTER — Emergency Department (HOSPITAL_COMMUNITY)
Admission: EM | Admit: 2012-03-11 | Discharge: 2012-03-11 | Disposition: A | Discharge status: Home / Self Care | Payer: PRIVATE HEALTH INSURANCE | Attending: Emergency Medicine | Admitting: Emergency Medicine

## 2012-03-11 ENCOUNTER — Encounter (HOSPITAL_COMMUNITY): Payer: Self-pay | Admitting: *Deleted

## 2012-03-11 DIAGNOSIS — Z794 Long term (current) use of insulin: Secondary | ICD-10-CM | POA: Insufficient documentation

## 2012-03-11 DIAGNOSIS — Z87891 Personal history of nicotine dependence: Secondary | ICD-10-CM | POA: Insufficient documentation

## 2012-03-11 DIAGNOSIS — Z885 Allergy status to narcotic agent status: Secondary | ICD-10-CM | POA: Insufficient documentation

## 2012-03-11 DIAGNOSIS — Z823 Family history of stroke: Secondary | ICD-10-CM | POA: Insufficient documentation

## 2012-03-11 DIAGNOSIS — I2510 Atherosclerotic heart disease of native coronary artery without angina pectoris: Secondary | ICD-10-CM | POA: Insufficient documentation

## 2012-03-11 DIAGNOSIS — Z888 Allergy status to other drugs, medicaments and biological substances status: Secondary | ICD-10-CM | POA: Insufficient documentation

## 2012-03-11 DIAGNOSIS — Z833 Family history of diabetes mellitus: Secondary | ICD-10-CM | POA: Insufficient documentation

## 2012-03-11 DIAGNOSIS — E119 Type 2 diabetes mellitus without complications: Secondary | ICD-10-CM | POA: Insufficient documentation

## 2012-03-11 DIAGNOSIS — I129 Hypertensive chronic kidney disease with stage 1 through stage 4 chronic kidney disease, or unspecified chronic kidney disease: Secondary | ICD-10-CM | POA: Insufficient documentation

## 2012-03-11 DIAGNOSIS — I252 Old myocardial infarction: Secondary | ICD-10-CM | POA: Insufficient documentation

## 2012-03-11 DIAGNOSIS — R339 Retention of urine, unspecified: Secondary | ICD-10-CM

## 2012-03-11 DIAGNOSIS — N183 Chronic kidney disease, stage 3 unspecified: Secondary | ICD-10-CM | POA: Insufficient documentation

## 2012-03-11 DIAGNOSIS — N390 Urinary tract infection, site not specified: Secondary | ICD-10-CM | POA: Insufficient documentation

## 2012-03-11 HISTORY — DX: Malignant neoplasm of bladder, unspecified: C67.9

## 2012-03-11 HISTORY — DX: Atherosclerotic heart disease of native coronary artery without angina pectoris: I25.10

## 2012-03-11 LAB — URINALYSIS, ROUTINE W REFLEX MICROSCOPIC
Bilirubin Urine: NEGATIVE
Glucose, UA: NEGATIVE mg/dL
Ketones, ur: NEGATIVE mg/dL
Protein, ur: 100 mg/dL — AB
pH: 6 (ref 5.0–8.0)

## 2012-03-11 LAB — URINE MICROSCOPIC-ADD ON

## 2012-03-11 LAB — CBC WITH DIFFERENTIAL/PLATELET
Basophils Absolute: 0 10*3/uL (ref 0.0–0.1)
Eosinophils Relative: 0 % (ref 0–5)
Lymphocytes Relative: 9 % — ABNORMAL LOW (ref 12–46)
Lymphs Abs: 0.8 10*3/uL (ref 0.7–4.0)
MCV: 84.9 fL (ref 78.0–100.0)
Neutro Abs: 7.2 10*3/uL (ref 1.7–7.7)
Neutrophils Relative %: 81 % — ABNORMAL HIGH (ref 43–77)
Platelets: 196 10*3/uL (ref 150–400)
RBC: 4.98 MIL/uL (ref 4.22–5.81)
RDW: 13.1 % (ref 11.5–15.5)
WBC: 8.9 10*3/uL (ref 4.0–10.5)

## 2012-03-11 MED ORDER — SODIUM CHLORIDE 0.9 % IV SOLN: INTRAVENOUS | Status: DC

## 2012-03-11 MED ORDER — SODIUM CHLORIDE 0.9 % IV BOLUS (SEPSIS)
250.0000 mL | Freq: Once | INTRAVENOUS | Status: AC
  Administered 2012-03-11: 250 mL via INTRAVENOUS

## 2012-03-11 MED ORDER — CEPHALEXIN 500 MG PO CAPS: 500.0000 mg | ORAL_CAPSULE | Freq: Four times a day (QID) | ORAL | Status: DC

## 2012-03-11 MED ORDER — DEXTROSE 5 % IV SOLN
1.0000 g | Freq: Once | INTRAVENOUS | Status: AC
  Administered 2012-03-11: 1 g via INTRAVENOUS
  Filled 2012-03-11: qty 10

## 2012-03-11 MED ORDER — ACETAMINOPHEN 500 MG PO TABS
ORAL_TABLET | ORAL | Status: AC
  Administered 2012-03-11: 1000 mg
  Filled 2012-03-11: qty 2

## 2012-03-11 NOTE — ED Notes (Signed)
Hematuria, "I need a catheter",  Hx of cancer of bladder.  Voiding in small amt

## 2012-03-11 NOTE — ED Provider Notes (Signed)
History   This chart was scribed for Shelda Jakes, MD by Sofie Rower. The patient was seen in room [facility] and the patient's care was started at 7:20PM    CSN: [id number]  Arrival date & time 03/11/12  1807   First MD Initiated Contact with Patient 03/11/12 1920      Chief Complaint  Patient presents with  . Hematuria    (Consider location/radiation/quality/duration/timing/severity/associated sxs/prior treatment) Patient is a 78 y.o. male presenting with fever and hematuria. The history is provided by the patient. No language interpreter was used.  Fever Primary symptoms of the febrile illness include fever and dysuria. Primary symptoms do not include headaches, abdominal pain, nausea, vomiting or rash. The current episode started 3 to 5 days ago. This is a new problem. The problem has been gradually worsening.  The fever began 3 to 5 days ago. The fever has been gradually worsening since its onset. The maximum temperature recorded prior to his arrival was 102 to 102.9 F. The temperature was taken by an oral thermometer.   The dysuria began yesterday. The discomfort is felt in the penis. The discomfort is moderate. The dysuria is associated with hematuria. The dysuria is not associated with frequency or urgency.  Hematuria This is a new problem. The current episode started yesterday. The problem is unchanged. He describes the hematuria as microscopic hematuria. The hematuria occurs throughout his entire urinary stream. He reports no clotting in his urine stream. The pain is moderate. He describes his urine color as clear. Irritative symptoms do not include frequency or urgency. Associated symptoms include dysuria and fever. Pertinent negatives include no abdominal pain, nausea or vomiting.    Craig Castillo is a 78 y.o. male , with a hx of bladder cancer (onset 10 years ago), who presents to the Emergency Department complaining of sudden, progressively worsening, difficulty  urinating, onset yesterday (03/10/12). The pt reports he was having difficulty urinating at home, when he tried to urinate, he would spray out urine without a large amount of flow. The pt request's to have a catheter placed while at [facility]. Additionally, the pt presents to the Emergency Department complaining of sudden, progressively worsening, fever (102.7, taken at [facility]), onset three days ago (03/08/12).  The pt reports he is concerned that he may have caught a sickness while attending his recent preoperative urological appointment.  The pt denies nausea, vomiting, chest pain, headache, rash, abdominal pain, and taking any antibiotics at present.   The pt does not smoke or drink alcohol.   PCP is Dr. Loel Lofty is Dr. Jerre Simon.    Past Medical History  Diagnosis Date  . ASCVD (arteriosclerotic cardiovascular disease)   . Dyspnea   . Dyslipidemia   . DJD (degenerative joint disease)   . Hypertension   . Diabetes mellitus   . Myocardial infarction 1997  . BPH (benign prostatic hyperplasia)   . CKD (chronic kidney disease) stage 3, GFR 30-59 ml/min   . Coronary artery disease   . Bladder cancer     Past Surgical History  Procedure Date  . Cholecystectomy   . Esophagogastroduodenoscopy 06/08/2002    Soft stricture of the GE  junction with erosive esophagitis. The stricture was dilated to 21 [place] using a Maloney dilator. Swollen fold at GE junction on the gastric site which was biopsied for histology and was suspicious for sentinel folds.  . Colonoscopy 06/08/2002    Small polyp was oblated via cold biopsy from the cecum and two were snared,  one was at the rectosigmoid junction measuring about a centimeter, another one at the rectum which was smaller.  . Transurethral resection of bladder tumor     x7  . Coronary angioplasty 1997    3 stents    Family History  Problem Relation Age of Onset  . Stroke Mother 19  . Diabetes Brother 58    History  Substance Use Topics  .  Smoking status: Former Smoker -- 0.5 packs/day for 20 years    Types: Cigarettes    Quit date: 03/08/1981  . Smokeless tobacco: Not on file  . Alcohol Use: No      Review of Systems  Constitutional: Positive for fever.  Gastrointestinal: Negative for nausea, vomiting and abdominal pain.  Genitourinary: Positive for dysuria and hematuria. Negative for urgency and frequency.  Skin: Negative for rash.  Neurological: Negative for headaches.  All other systems reviewed and are negative.    Allergies  Celecoxib; Hydrocodone-acetaminophen; Morphine; Niacin; and Piroxicam  Home Medications   Current Outpatient Rx  Name Route Sig Dispense Refill  . ACETAMINOPHEN 325 MG PO TABS Oral Take 325 mg by mouth every 6 (six) hours as needed. Patient takes 1 tablet 325mg  almost every morning    . AMLODIPINE BESYLATE 10 MG PO TABS Oral Take 10 mg by mouth daily.      . CEPHALEXIN 500 MG PO CAPS Oral Take 1 capsule (500 mg total) by mouth 4 (four) times daily. 28 capsule 0  . DOXYCYCLINE HYCLATE 100 MG PO TABS Oral Take 100 mg by mouth 2 (two) times daily.    Marland Kitchen GEMFIBROZIL 600 MG PO TABS Oral Take 600 mg by mouth 2 (two) times daily.     . INSULIN ISOPHANE HUMAN 100 UNIT/ML Unity SUSP Subcutaneous Inject 20 Units into the skin 2 (two) times daily.      Marland Kitchen METOPROLOL TARTRATE 50 MG PO TABS Oral Take 50 mg by mouth 2 (two) times daily.      Marland Kitchen NITROGLYCERIN 0.4 MG SL SUBL Sublingual Place 0.4 mg under the tongue as directed.      Marland Kitchen OMEPRAZOLE 20 MG PO CPDR Oral Take 20 mg by mouth daily.      . SODIUM CHLORIDE 0.65 % NA SOLN Nasal Place 1 spray into the nose as needed. congestion    . TAMSULOSIN HCL 0.4 MG PO CAPS Oral Take 0.4 mg by mouth daily.     Marland Kitchen VITAMIN C 500 MG PO TABS Oral Take 500 mg by mouth daily.      BP 169/73  Pulse 100  Temp 102.7 F (39.3 C) (Oral)  Resp 18  SpO2 97%  Physical Exam  Nursing note and vitals reviewed. Constitutional: He is oriented to person, place, and time. He  appears well-developed and well-nourished.  HENT:  Head: Atraumatic.  Nose: Nose normal.  Eyes: Conjunctivae normal and EOM are normal.  Neck: Normal range of motion.  Cardiovascular: Normal rate, regular rhythm and normal heart sounds.   Pulmonary/Chest: Effort normal and breath sounds normal.  Abdominal: Soft. Bowel sounds are normal. There is no tenderness.  Genitourinary: Penis normal.  Musculoskeletal: Normal range of motion.  Neurological: He is alert and oriented to person, place, and time.  Skin: Skin is warm and dry.  Psychiatric: He has a normal mood and affect. His behavior is normal.    ED Course  Procedures (including critical care time)  DIAGNOSTIC STUDIES: Oxygen Saturation is 97% on room air, normal by my interpretation.  COORDINATION OF CARE:    7:35PM- Treatment plan concerning application of catheter discussed with patient. Pt agrees with treatment.    Results for orders placed during the hospital encounter of 03/11/12  URINALYSIS, ROUTINE W REFLEX MICROSCOPIC      Component Value Range   Color, Urine YELLOW  YELLOW   APPearance CLEAR  CLEAR   Specific Gravity, Urine 1.025  1.005 - 1.030   pH 6.0  5.0 - 8.0   Glucose, UA NEGATIVE  NEGATIVE mg/dL   Hgb urine dipstick MODERATE (*) NEGATIVE   Bilirubin Urine NEGATIVE  NEGATIVE   Ketones, ur NEGATIVE  NEGATIVE mg/dL   Protein, ur 295 (*) NEGATIVE mg/dL   Urobilinogen, UA 0.2  0.0 - 1.0 mg/dL   Nitrite NEGATIVE  NEGATIVE   Leukocytes, UA NEGATIVE  NEGATIVE  CBC WITH DIFFERENTIAL      Component Value Range   WBC 8.9  4.0 - 10.5 K/uL   RBC 4.98  4.22 - 5.81 MIL/uL   Hemoglobin 14.9  13.0 - 17.0 g/dL   HCT 62.1  30.8 - 65.7 %   MCV 84.9  78.0 - 100.0 fL   MCH 29.9  26.0 - 34.0 pg   MCHC 35.2  30.0 - 36.0 g/dL   RDW 84.6  96.2 - 95.2 %   Platelets 196  150 - 400 K/uL   Neutrophils Relative 81 (*) 43 - 77 %   Neutro Abs 7.2  1.7 - 7.7 K/uL   Lymphocytes Relative 9 (*) 12 - 46 %   Lymphs Abs 0.8   0.7 - 4.0 K/uL   Monocytes Relative 10  3 - 12 %   Monocytes Absolute 0.9  0.1 - 1.0 K/uL   Eosinophils Relative 0  0 - 5 %   Eosinophils Absolute 0.0  0.0 - 0.7 K/uL   Basophils Relative 0  0 - 1 %   Basophils Absolute 0.0  0.0 - 0.1 K/uL  BASIC METABOLIC PANEL      Component Value Range   Sodium 129 (*) 135 - 145 mEq/L   Potassium 3.7  3.5 - 5.1 mEq/L   Chloride 97  96 - 112 mEq/L   CO2 21  19 - 32 mEq/L   Glucose, Bld 252 (*) 70 - 99 mg/dL   BUN 30 (*) 6 - 23 mg/dL   Creatinine, Ser 8.41 (*) 0.50 - 1.35 mg/dL   Calcium 9.3  8.4 - 32.4 mg/dL   GFR calc non Af Amer 26 (*) >90 mL/min   GFR calc Af Amer 30 (*) >90 mL/min  URINE MICROSCOPIC-ADD ON      Component Value Range   Squamous Epithelial / LPF FEW (*) RARE   WBC, UA 21-50  <3 WBC/hpf   RBC / HPF 3-6  <3 RBC/hpf   Bacteria, UA FEW (*) RARE   Casts GRANULAR CAST (*) NEGATIVE   Dg Chest 2 View  03/11/2012  *RADIOLOGY REPORT*  Clinical Data: Hematuria  CHEST - 2 VIEW  Comparison: 11/19/2011  Findings: The heart size and mediastinal contours are within normal limits.  Both lungs are clear.  The visualized skeletal structures are remarkable for multilevel thoracic spondylosis.  IMPRESSION:  No active cardiopulmonary abnormalities.   Original Report Authenticated By: Rosealee Albee, M.D.          1. Urinary tract infection   2. Urinary retention       MDM  Patient with known bladder cancer. Urinalysis today is consistent with a urinary tract  infection culture sent we'll treat with Keflex received 1 g of Rocephin IV piggyback in the emergency apartment. White count not elevated however patient does have a fever 102 is possible there could be a viral process: On but the urine is suspicious for for being the cause of the infection. Patient felt he was having urinary retention problems he voided right before we did do the Foley catheter in about 200 cc residual and a will keep Foley catheter in place and switch over to leg  bag he is already followed by urology he can followup with them after the weekend. Patient will return for any newer worse symptoms.      I personally performed the services described in this documentation, which was scribed in my presence. The recorded information has been reviewed and considered.     Shelda Jakes, MD 03/11/12 [phone]

## 2012-03-11 NOTE — ED Notes (Signed)
Foley catheter placed using sterile technique, pt tolerated well.

## 2012-03-12 LAB — BASIC METABOLIC PANEL
CO2: 21 mEq/L (ref 19–32)
Calcium: 9.3 mg/dL (ref 8.4–10.5)
Potassium: 3.9 mEq/L (ref 3.5–5.1)
Sodium: 132 mEq/L — ABNORMAL LOW (ref 135–145)

## 2012-03-13 LAB — URINE CULTURE
Colony Count: NO GROWTH
Culture: NO GROWTH

## 2012-03-13 NOTE — ED Notes (Signed)
Lab called 10/19 and notified rn of instrument error and corrected report sodium 132, was previously reported as 129. Pt was treated and given bolus in the er.

## 2012-03-14 NOTE — OR Nursing (Signed)
Dr. Jerre Simon office called in regards to need of H&P

## 2012-03-15 ENCOUNTER — Ambulatory Visit (HOSPITAL_COMMUNITY)
Admission: RE | Admit: 2012-03-15 | Discharge: 2012-03-16 | Disposition: A | Discharge status: Home / Self Care | Payer: PRIVATE HEALTH INSURANCE | Source: Ambulatory Visit | Attending: Urology | Admitting: Urology

## 2012-03-15 ENCOUNTER — Encounter (HOSPITAL_COMMUNITY): Payer: Self-pay | Admitting: *Deleted

## 2012-03-15 ENCOUNTER — Encounter (HOSPITAL_COMMUNITY): Payer: Self-pay | Admitting: Anesthesiology

## 2012-03-15 ENCOUNTER — Ambulatory Visit (HOSPITAL_COMMUNITY): Payer: PRIVATE HEALTH INSURANCE | Admitting: Anesthesiology

## 2012-03-15 ENCOUNTER — Encounter (HOSPITAL_COMMUNITY)
Admission: RE | Disposition: A | Discharge status: Home / Self Care | Payer: Self-pay | Source: Ambulatory Visit | Attending: Urology

## 2012-03-15 DIAGNOSIS — Z23 Encounter for immunization: Secondary | ICD-10-CM | POA: Insufficient documentation

## 2012-03-15 DIAGNOSIS — I10 Essential (primary) hypertension: Secondary | ICD-10-CM

## 2012-03-15 DIAGNOSIS — C679 Malignant neoplasm of bladder, unspecified: Secondary | ICD-10-CM | POA: Insufficient documentation

## 2012-03-15 DIAGNOSIS — Z01812 Encounter for preprocedural laboratory examination: Secondary | ICD-10-CM | POA: Insufficient documentation

## 2012-03-15 DIAGNOSIS — N184 Chronic kidney disease, stage 4 (severe): Secondary | ICD-10-CM

## 2012-03-15 DIAGNOSIS — D494 Neoplasm of unspecified behavior of bladder: Secondary | ICD-10-CM

## 2012-03-15 DIAGNOSIS — R0602 Shortness of breath: Secondary | ICD-10-CM | POA: Insufficient documentation

## 2012-03-15 DIAGNOSIS — E119 Type 2 diabetes mellitus without complications: Secondary | ICD-10-CM | POA: Insufficient documentation

## 2012-03-15 DIAGNOSIS — I2510 Atherosclerotic heart disease of native coronary artery without angina pectoris: Secondary | ICD-10-CM | POA: Insufficient documentation

## 2012-03-15 DIAGNOSIS — E1129 Type 2 diabetes mellitus with other diabetic kidney complication: Secondary | ICD-10-CM | POA: Diagnosis present

## 2012-03-15 DIAGNOSIS — Z794 Long term (current) use of insulin: Secondary | ICD-10-CM | POA: Insufficient documentation

## 2012-03-15 DIAGNOSIS — E785 Hyperlipidemia, unspecified: Secondary | ICD-10-CM | POA: Diagnosis present

## 2012-03-15 HISTORY — PX: TRANSURETHRAL RESECTION OF BLADDER TUMOR: SHX2575

## 2012-03-15 LAB — BASIC METABOLIC PANEL
CO2: 27 mEq/L (ref 19–32)
Chloride: 104 mEq/L (ref 96–112)
Glucose, Bld: 111 mg/dL — ABNORMAL HIGH (ref 70–99)
Sodium: 139 mEq/L (ref 135–145)

## 2012-03-15 LAB — GLUCOSE, CAPILLARY
Glucose-Capillary: 159 mg/dL — ABNORMAL HIGH (ref 70–99)
Glucose-Capillary: 121 mg/dL — ABNORMAL HIGH (ref 70–99)

## 2012-03-15 SURGERY — TURBT (TRANSURETHRAL RESECTION OF BLADDER TUMOR)
Anesthesia: Spinal | Wound class: Clean Contaminated

## 2012-03-15 MED ORDER — MIDAZOLAM HCL 2 MG/2ML IJ SOLN
INTRAMUSCULAR | Status: AC
  Filled 2012-03-15: qty 2

## 2012-03-15 MED ORDER — VITAMIN C 500 MG PO TABS
500.0000 mg | ORAL_TABLET | Freq: Every day | ORAL | Status: DC
  Administered 2012-03-15 – 2012-03-16 (×2): 500 mg via ORAL
  Filled 2012-03-15 (×2): qty 1

## 2012-03-15 MED ORDER — GEMFIBROZIL 600 MG PO TABS
600.0000 mg | ORAL_TABLET | Freq: Two times a day (BID) | ORAL | Status: DC
  Administered 2012-03-15 – 2012-03-16 (×3): 600 mg via ORAL
  Filled 2012-03-15 (×3): qty 1

## 2012-03-15 MED ORDER — LIDOCAINE HCL (CARDIAC) 10 MG/ML IV SOLN
INTRAVENOUS | Status: DC | PRN
  Administered 2012-03-15: 50 mg via INTRAVENOUS
  Administered 2012-03-15: 300 mg via INTRAVENOUS

## 2012-03-15 MED ORDER — PROPOFOL 10 MG/ML IV EMUL
INTRAVENOUS | Status: AC
  Filled 2012-03-15: qty 20

## 2012-03-15 MED ORDER — LACTATED RINGERS IV SOLN
INTRAVENOUS | Status: DC | PRN
  Administered 2012-03-15 (×2): via INTRAVENOUS

## 2012-03-15 MED ORDER — BUPIVACAINE HCL (PF) 0.75 % IJ SOLN
INTRAMUSCULAR | Status: DC | PRN
  Administered 2012-03-15: 1.8 mL via INTRATHECAL

## 2012-03-15 MED ORDER — TAMSULOSIN HCL 0.4 MG PO CAPS
0.4000 mg | ORAL_CAPSULE | Freq: Every day | ORAL | Status: DC
Start: 2012-03-15 — End: 2012-03-16
  Administered 2012-03-15 – 2012-03-16 (×2): 0.4 mg via ORAL
  Filled 2012-03-15 (×2): qty 1

## 2012-03-15 MED ORDER — PROPOFOL INFUSION 10 MG/ML OPTIME
INTRAVENOUS | Status: DC | PRN
  Administered 2012-03-15: 75 ug/kg/min via INTRAVENOUS

## 2012-03-15 MED ORDER — GLYCINE 1.5 % IR SOLN
Status: DC | PRN
  Administered 2012-03-15: 3000 mL

## 2012-03-15 MED ORDER — ACETAMINOPHEN 325 MG PO TABS
325.0000 mg | ORAL_TABLET | Freq: Four times a day (QID) | ORAL | Status: DC | PRN
  Administered 2012-03-15: 325 mg via ORAL
  Filled 2012-03-15: qty 1

## 2012-03-15 MED ORDER — FENTANYL CITRATE 0.05 MG/ML IJ SOLN
INTRAMUSCULAR | Status: AC
  Filled 2012-03-15: qty 2

## 2012-03-15 MED ORDER — ONDANSETRON HCL 4 MG/2ML IJ SOLN: 4.0000 mg | Freq: Once | INTRAMUSCULAR | Status: DC | PRN

## 2012-03-15 MED ORDER — DOXYCYCLINE HYCLATE 100 MG PO TABS
100.0000 mg | ORAL_TABLET | Freq: Two times a day (BID) | ORAL | Status: DC
Start: 2012-03-15 — End: 2012-03-16
  Administered 2012-03-15 – 2012-03-16 (×3): 100 mg via ORAL
  Filled 2012-03-15 (×3): qty 1

## 2012-03-15 MED ORDER — LACTATED RINGERS IV SOLN
INTRAVENOUS | Status: DC
  Administered 2012-03-15: 08:00:00 via INTRAVENOUS

## 2012-03-15 MED ORDER — METOPROLOL TARTRATE 50 MG PO TABS
50.0000 mg | ORAL_TABLET | Freq: Two times a day (BID) | ORAL | Status: DC
Start: 2012-03-15 — End: 2012-03-16
  Administered 2012-03-15 – 2012-03-16 (×2): 50 mg via ORAL
  Filled 2012-03-15 (×2): qty 1

## 2012-03-15 MED ORDER — INSULIN NPH (HUMAN) (ISOPHANE) 100 UNIT/ML ~~LOC~~ SUSP
20.0000 [IU] | Freq: Two times a day (BID) | SUBCUTANEOUS | Status: DC
  Administered 2012-03-15 – 2012-03-16 (×2): 20 [IU] via SUBCUTANEOUS
  Filled 2012-03-15: qty 10

## 2012-03-15 MED ORDER — INFLUENZA VIRUS VACC SPLIT PF IM SUSP
0.5000 mL | INTRAMUSCULAR | Status: AC
  Administered 2012-03-16: 0.5 mL via INTRAMUSCULAR
  Filled 2012-03-15: qty 0.5

## 2012-03-15 MED ORDER — PANTOPRAZOLE SODIUM 40 MG PO TBEC
40.0000 mg | DELAYED_RELEASE_TABLET | Freq: Every day | ORAL | Status: DC
  Administered 2012-03-16: 40 mg via ORAL
  Filled 2012-03-15: qty 1

## 2012-03-15 MED ORDER — FENTANYL CITRATE 0.05 MG/ML IJ SOLN
INTRAMUSCULAR | Status: DC | PRN
  Administered 2012-03-15: 20 ug via INTRATHECAL

## 2012-03-15 MED ORDER — EPHEDRINE SULFATE 50 MG/ML IJ SOLN
INTRAMUSCULAR | Status: AC
  Filled 2012-03-15: qty 1

## 2012-03-15 MED ORDER — LIDOCAINE HCL (PF) 1 % IJ SOLN
INTRAMUSCULAR | Status: AC
  Filled 2012-03-15: qty 5

## 2012-03-15 MED ORDER — SODIUM CHLORIDE 0.9 % IJ SOLN
INTRAMUSCULAR | Status: AC
  Administered 2012-03-15: 16:00:00
  Filled 2012-03-15: qty 3

## 2012-03-15 MED ORDER — SALINE SPRAY 0.65 % NA SOLN
1.0000 | NASAL | Status: DC | PRN
  Filled 2012-03-15: qty 44

## 2012-03-15 MED ORDER — STERILE WATER FOR IRRIGATION IR SOLN
Status: DC | PRN
  Administered 2012-03-15: 1000 mL

## 2012-03-15 MED ORDER — CEPHALEXIN 500 MG PO CAPS
500.0000 mg | ORAL_CAPSULE | Freq: Four times a day (QID) | ORAL | Status: DC
Start: 2012-03-15 — End: 2012-03-16
  Administered 2012-03-15 – 2012-03-16 (×4): 500 mg via ORAL
  Filled 2012-03-15 (×4): qty 1

## 2012-03-15 MED ORDER — NITROGLYCERIN 0.4 MG SL SUBL: 0.4000 mg | SUBLINGUAL_TABLET | SUBLINGUAL | Status: DC

## 2012-03-15 MED ORDER — MITOMYCIN CHEMO FOR BLADDER INSTILLATION 40 MG
60.0000 mg | Freq: Once | INTRAVENOUS | Status: AC
  Administered 2012-03-15 (×2): 60 mg via INTRAVESICAL
  Filled 2012-03-15: qty 60

## 2012-03-15 MED ORDER — AMLODIPINE BESYLATE 5 MG PO TABS
10.0000 mg | ORAL_TABLET | Freq: Every day | ORAL | Status: DC
  Administered 2012-03-16: 10 mg via ORAL
  Filled 2012-03-15: qty 2

## 2012-03-15 MED ORDER — MIDAZOLAM HCL 2 MG/2ML IJ SOLN
1.0000 mg | INTRAMUSCULAR | Status: AC | PRN
  Administered 2012-03-15 (×3): 2 mg via INTRAVENOUS

## 2012-03-15 MED ORDER — FENTANYL CITRATE 0.05 MG/ML IJ SOLN: 25.0000 ug | INTRAMUSCULAR | Status: DC | PRN

## 2012-03-15 SURGICAL SUPPLY — 27 items
BAG DECANTER FOR FLEXI CONT (MISCELLANEOUS) ×2 IMPLANT
BAG DRAIN URO TABLE W/ADPT NS (DRAPE) ×2 IMPLANT
BAG DRN 8 ADPR NS SKTRN CSTL (DRAPE) ×1
BAG URINE DRAINAGE (UROLOGICAL SUPPLIES) ×2 IMPLANT
CABLE HI FREQUENCY MONOPOLAR (ELECTROSURGICAL) ×2 IMPLANT
CATH FOLEY 2WAY SLVR  5CC 20FR (CATHETERS) ×2
CATH FOLEY 2WAY SLVR 5CC 20FR (CATHETERS) ×1 IMPLANT
CLOTH BEACON ORANGE TIMEOUT ST (SAFETY) ×2 IMPLANT
CONNECTOR 5 IN 1 STRAIGHT STRL (MISCELLANEOUS) ×2 IMPLANT
ELECT CUT LOOP C-MAX 27FR .012 (CUTTING LOOP) ×2
ELECTRODE CUT LP CMX 27FR .012 (CUTTING LOOP) ×1 IMPLANT
FORMALIN 10 PREFIL 120ML (MISCELLANEOUS) ×1 IMPLANT
GLOVE BIO SURGEON STRL SZ7 (GLOVE) ×2 IMPLANT
GLOVE INDICATOR 7.0 STRL GRN (GLOVE) ×2 IMPLANT
GLOVE SS BIOGEL STRL SZ 6.5 (GLOVE) ×1 IMPLANT
GLOVE SUPERSENSE BIOGEL SZ 6.5 (GLOVE) ×1
GLYCINE 1.5% IRRIG UROMATIC (IV SOLUTION) ×8 IMPLANT
GOWN STRL REIN XL XLG (GOWN DISPOSABLE) ×2 IMPLANT
KIT ROOM TURNOVER AP CYSTO (KITS) ×2 IMPLANT
MANIFOLD NEPTUNE II (INSTRUMENTS) ×2 IMPLANT
PACK CYSTO (CUSTOM PROCEDURE TRAY) ×2 IMPLANT
PAD ARMBOARD 7.5X6 YLW CONV (MISCELLANEOUS) ×2 IMPLANT
PAD TELFA 3X4 1S STER (GAUZE/BANDAGES/DRESSINGS) ×1 IMPLANT
SET IRRIGATING DISP (SET/KITS/TRAYS/PACK) ×2 IMPLANT
SYR 30ML LL (SYRINGE) ×2 IMPLANT
TOWEL OR 17X26 4PK STRL BLUE (TOWEL DISPOSABLE) ×2 IMPLANT
YANKAUER SUCT BULB TIP 10FT TU (MISCELLANEOUS) ×2 IMPLANT

## 2012-03-15 NOTE — Brief Op Note (Signed)
03/15/2012  12:02 PM  PATIENT:  Craig Castillo  78 y.o. male  PRE-OPERATIVE DIAGNOSIS:  recurrent bladder cancer  POST-OPERATIVE DIAGNOSIS:  recurrent bladder cancer  PROCEDURE:  Procedure(s) (LRB) with comments: TRANSURETHRAL RESECTION OF BLADDER TUMOR (TURBT) (N/A)  SURGEON:  Surgeon(s) and Role:    * Ky Barban, MD - Primary  PHYSICIAN ASSISTANT:   ASSISTANTS: none   ANESTHESIA:   spinal  EBL:  Total I/O In: 1000 [I.V.:1000] Out: 300 [Urine:300]  BLOOD ADMINISTERED:none  DRAINS: Urinary Catheter (Foley)   LOCAL MEDICATIONS USED:  NONE  SPECIMEN:  Source of Specimen:  bladder  DISPOSITION OF SPECIMEN:  PATHOLOGY  COUNTS:  YES  TOURNIQUET:  * No tourniquets in log *  DICTATION: .Other Dictation: Dictation Number dictation [id number]  PLAN OF CARE: Admit for overnight observation  PATIENT DISPOSITION:  PACU - hemodynamically stable.   Delay start of Pharmacological VTE agent (>24hrs) due to surgical blood loss or risk of bleeding:

## 2012-03-15 NOTE — Anesthesia Preprocedure Evaluation (Signed)
Anesthesia Evaluation  Patient identified by MRN, date of birth, ID band Patient awake    Reviewed: Allergy & Precautions, H&P , NPO status , Patient's Chart, lab work & pertinent test results, reviewed documented beta blocker date and time   Airway Mallampati: I TM Distance: >3 FB     Dental  (+) Teeth Intact   Pulmonary shortness of breath and with exertion, former smoker,  breath sounds clear to auscultation        Cardiovascular hypertension, Pt. on medications - angina+ CAD, + Past MI and + Cardiac Stents Rhythm:Regular Rate:Normal     Neuro/Psych    GI/Hepatic   Endo/Other  diabetes, Type 2, Oral Hypoglycemic Agents  Renal/GU Renal InsufficiencyRenal disease     Musculoskeletal   Abdominal   Peds  Hematology   Anesthesia Other Findings   Reproductive/Obstetrics                           Anesthesia Physical Anesthesia Plan  ASA: III  Anesthesia Plan: Spinal   Post-op Pain Management:    Induction:   Airway Management Planned: Nasal Cannula  Additional Equipment:   Intra-op Plan:   Post-operative Plan:   Informed Consent: I have reviewed the patients History and Physical, chart, labs and discussed the procedure including the risks, benefits and alternatives for the proposed anesthesia with the patient or authorized representative who has indicated his/her understanding and acceptance.     Plan Discussed with:   Anesthesia Plan Comments:         Anesthesia Quick Evaluation

## 2012-03-15 NOTE — Transfer of Care (Signed)
  Anesthesia Post-op Note  Patient: Craig Castillo  Procedure(s) Performed: Procedure(s) (LRB) with comments: TRANSURETHRAL RESECTION OF BLADDER TUMOR (TURBT) (N/A)  Patient Location: PACU  Anesthesia Type:Spinal  Level of Consciousness: awake, alert , oriented and patient cooperative  Airway and Oxygen Therapy: Patient Spontanous Breathing and Patient connected to face mask oxygen  Post-op Pain: mild  Post-op Assessment: Post-op Vital signs reviewed, Patient's Cardiovascular Status Stable, Respiratory Function Stable, Patent Airway and No signs of Nausea or vomiting  Post-op Vital Signs: Reviewed and stable  Complications: No apparent anesthesia complications

## 2012-03-15 NOTE — Preoperative (Signed)
Beta Blockers   Reason not to administer Beta Blockers:Not Applicable 

## 2012-03-15 NOTE — Anesthesia Postprocedure Evaluation (Signed)
  Anesthesia Post-op Note  Patient: Craig Castillo  Procedure(s) Performed: Procedure(s) (LRB) with comments: TRANSURETHRAL RESECTION OF BLADDER TUMOR (TURBT) (N/A)  Patient Location: PACU  Anesthesia Type: Spinal  Level of Consciousness: awake, alert , oriented and patient cooperative  Airway and Oxygen Therapy: Patient Spontanous Breathing on Room air  Post-op Pain: mild  Post-op Assessment: Post-op Vital signs reviewed, Patient's Cardiovascular Status Stable, Respiratory Function Stable, Patent Airway and No signs of Nausea or vomiting  Post-op Vital Signs: Reviewed and stable  Complications: No apparent anesthesia complications

## 2012-03-15 NOTE — Progress Notes (Signed)
Paged Dr. Jerre Simon regarding patient's increase in BP. New order received to consult hospitalist. Will continue to monitor.

## 2012-03-15 NOTE — H&P (Signed)
NAME:  Craig Castillo, Craig Castillo NO.:  [id number]  MEDICAL RECORD NO.:  [id number]  LOCATION:                                 FACILITY:  PHYSICIAN:  Ky Barban, M.D.DATE OF BIRTH:  1933-01-02  DATE OF ADMISSION:  03/15/2012 DATE OF DISCHARGE:  LH                             HISTORY & PHYSICAL   HISTORY OF PRESENT ILLNESS:  Craig Castillo is well known to me, history of having bladder tumor which was rather large and low-grade underwent TURBT in the past several time and also had BCG treatment.  Bladder tumor has completely disappeared, but at this time, there is a recurrence in the prostatic urethra for which I am going to bring him as outpatient to TURBT, keep him overnight in the hospital.  He also having history of having stones in the ureter, but not anymore and there is some hydronephrosis on both sides, but it is stable.  PAST MEDICAL HISTORY:  Insulin-dependent diabetes, history of having coronary artery disease, coronary stent placed in 1997.  On p.r.n. basis sometime, he still takes nitroglycerin.  FAMILY HISTORY:  Negative.  PERSONAL HISTORY:  Does not smoke or drink.  REVIEW OF SYSTEMS:  Unremarkable.  PHYSICAL EXAMINATION:  GENERAL:  Moderately built, not in acute distress fully conscious, alert, oriented.  VITAL SIGNS:  Blood pressure 120/80, temperature is normal.  CENTRAL NERVOUS SYSTEM:  No gross neurological deficit.  HEAD, NECK, EYES, ENT:  Negative.  NECK:  Supple.  No lymphadenopathy.  CHEST:  Symmetrical.  Normal breath sounds.  HEART:  Regular sinus rhythm.  No murmur.  ABDOMEN:  Soft, flat.  Liver, spleen, kidneys are not palpable.  No CVA tenderness.  GU: External genitalia is unremarkable.  Rectal:  Prostate 2+, smooth and firm.  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  History of having a ureteral calculi, insulin-dependent diabetes, hypertension, coronary artery disease, bladder tumor recurrence  in the prostatic urethra.  Bilateral low-grade hydronephrosis.  PLAN:  TUR of tumor in the prostatic urethra under anesthesia, then keep him overnight in the hospital.  TUR of prostatic urethra for bladder tumor recurrence.     Ky Barban, M.D.     MIJ/MEDQ  D:  03/14/2012  T:  03/15/2012  Job:  [id number]

## 2012-03-15 NOTE — Consult Note (Signed)
[facility] Medical Consultation  SHAYDEN BOBIER ZOX:[id number] DOB: July 06, 1932 DOA: 03/15/2012 PCP: Cassell Smiles., MD   Requesting physician: Jerre Simon Date of consultation: 03/15/12 Reason for consultation: High blood pressure  Impression/Recommendations Active Problems:  Malignant hypertension: Resume outpatient medications. We will add clonidine 0.1 mg by mouth every 6 hours when necessary systolic blood pressure above 811 or diastolic blood pressure above 914.  DM (diabetes mellitus), type 2 with renal complications: Monitor blood glucose Q. a.c. and at bedtime.  CARCINOMA, BLADDER, status post TURBT  DYSLIPIDEMIA  ATHEROSCLEROTIC CARDIOVASCULAR DISEASE  CKD (chronic kidney disease), stage IV  Chief Complaint: High blood pressure  HPI: Mr. Craig Castillo is a 78 year old white male who was admitted post TURBT. His blood pressure postoperatively was 180/80. He has received his antihypertensives. He has no pain or nausea currently. His blood pressure has improved and is now 150/76. He hospitalists have been consulted to assist with blood pressure management.   Review of Systems:  Systems reviewed and as above otherwise negative.  Past Medical History  Diagnosis Date  . ASCVD (arteriosclerotic cardiovascular disease)   . Dyspnea   . Dyslipidemia   . DJD (degenerative joint disease)   . Hypertension   . Diabetes mellitus   . Myocardial infarction 1997  . BPH (benign prostatic hyperplasia)   . CKD (chronic kidney disease) stage 3, GFR 30-59 ml/min   . Coronary artery disease   . Bladder cancer    Past Surgical History  Procedure Date  . Cholecystectomy   . Esophagogastroduodenoscopy 06/08/2002    Soft stricture of the GE  junction with erosive esophagitis. The stricture was dilated to 27 [place] using a Maloney dilator. Swollen fold at GE junction on the gastric site which was biopsied for histology and was suspicious for sentinel folds.  . Colonoscopy 06/08/2002   Small polyp was oblated via cold biopsy from the cecum and two were snared, one was at the rectosigmoid junction measuring about a centimeter, another one at the rectum which was smaller.  . Transurethral resection of bladder tumor     x7  . Coronary angioplasty 1997    3 stents   Social History:  reports that he quit smoking about 31 years ago. His smoking use included Cigarettes. He has a 10 pack-year smoking history. He does not have any smokeless tobacco history on file. He reports that he does not drink alcohol or use illicit drugs.  Allergies  Allergen Reactions  . Celecoxib Swelling  . Hydrocodone-Acetaminophen Itching  . Morphine Itching  . Niacin Itching  . Piroxicam Itching   Family History  Problem Relation Age of Onset  . Stroke Mother 54  . Diabetes Brother 48    Prior to Admission medications   Medication Sig Start Date End Date Taking? Authorizing Provider  acetaminophen (TYLENOL) 325 MG tablet Take 325 mg by mouth every 6 (six) hours as needed. Patient takes 1 tablet 325mg  almost every morning   Yes Historical Provider, MD  amLODipine (NORVASC) 10 MG tablet Take 10 mg by mouth daily.     Yes Historical Provider, MD  doxycycline (VIBRA-TABS) 100 MG tablet Take 100 mg by mouth 2 (two) times daily.   Yes Historical Provider, MD  gemfibrozil (LOPID) 600 MG tablet Take 600 mg by mouth 2 (two) times daily.    Yes Historical Provider, MD  insulin NPH (HUMULIN N,NOVOLIN N) 100 UNIT/ML injection Inject 20 Units into the skin 2 (two) times daily.     Yes Historical Provider, MD  metoprolol (  LOPRESSOR) 50 MG tablet Take 50 mg by mouth 2 (two) times daily.     Yes Historical Provider, MD  omeprazole (PRILOSEC) 20 MG capsule Take 20 mg by mouth daily.     Yes Historical Provider, MD  sodium chloride (OCEAN) 0.65 % nasal spray Place 1 spray into the nose as needed. congestion   Yes Historical Provider, MD  Tamsulosin HCl (FLOMAX) 0.4 MG CAPS Take 0.4 mg by mouth daily.    Yes  Historical Provider, MD  vitamin C (ASCORBIC ACID) 500 MG tablet Take 500 mg by mouth daily.   Yes Historical Provider, MD  cephALEXin (KEFLEX) 500 MG capsule Take 1 capsule (500 mg total) by mouth 4 (four) times daily. 03/11/12   Shelda Jakes, MD  nitroGLYCERIN (NITROSTAT) 0.4 MG SL tablet Place 0.4 mg under the tongue as directed.      Historical Provider, MD   Physical Exam: Blood pressure 154/76, pulse 72, temperature 97.6 F (36.4 C), temperature source Oral, resp. rate 18, height 5\' 8"  (1.727 m), weight 109.3 kg (240 lb 15.4 oz), SpO2 98.00%. Filed Vitals:   03/15/12 1507 03/15/12 1511 03/15/12 1537 03/15/12 1639  BP: 171/70 181/71 169/82 154/76  Pulse:   66 72  Temp:   97.9 F (36.6 C) 97.6 F (36.4 C)  TempSrc:   Oral Oral  Resp:   18 18  Height:      Weight:      SpO2:   96% 98%   BP 154/76  Pulse 72  Temp 97.6 F (36.4 C) (Oral)  Resp 18  Ht 5\' 8"  (1.727 m)  Wt 109.3 kg (240 lb 15.4 oz)  BMI 36.64 kg/m2  SpO2 98%  General Appearance:    Alert, cooperative, no distress, appears stated age.comfortable. Eating supper.   Head:    Normocephalic, without obvious abnormality, atraumatic  Eyes:    PERRL, conjunctiva/corneas clear, EOM's intact, fundi    benign, both eyes       Ears:    Normal TM's and external ear canals, both ears  Nose:   Nares normal, septum midline, mucosa normal, no drainage   or sinus tenderness  Throat:   Lips, mucosa, and tongue normal; teeth and gums normal  Neck:   Supple, symmetrical, trachea midline, no adenopathy;       thyroid:  No enlargement/tenderness/nodules; no carotid   bruit or JVD  Back:     Symmetric, no curvature, ROM normal, no CVA tenderness  Lungs:     Clear to auscultation bilaterally, respirations unlabored  Chest wall:    No tenderness or deformity  Heart:    Regular rate and rhythm, S1 and S2 normal, no murmur, rub   or gallop  Abdomen:     Soft, non-tender, bowel sounds active all four quadrants,    no masses, no  organomegaly  Genitalia:   Foley catheter in place   Rectal:   deferred   Extremities:   Extremities normal, atraumatic, no cyanosis or edema  Pulses:   2+ and symmetric all extremities  Skin:   Skin color, texture, turgor normal, no rashes or lesions  Lymph nodes:   Cervical, supraclavicular, and axillary nodes normal  Neurologic:   CNII-XII intact. Normal strength, sensation and reflexes      throughout    Psychiatric: Normal affect.  Labs on Admission:  Basic Metabolic Panel:  Lab 03/15/12 4098 03/11/12 1950  NA 139 132*  K 4.2 3.9  CL 104 99  CO2 27 21  GLUCOSE 111* 252*  BUN 24* 30*  CREATININE 1.75* 2.30*  CALCIUM 9.2 9.3  MG -- --  PHOS -- --   Liver Function Tests: No results found for this basename: AST:5,ALT:5,ALKPHOS:5,BILITOT:5,PROT:5,ALBUMIN:5 in the last 168 hours No results found for this basename: LIPASE:5,AMYLASE:5 in the last 168 hours No results found for this basename: AMMONIA:5 in the last 168 hours CBC:  Lab 03/11/12 1950  WBC 8.9  NEUTROABS 7.2  HGB 14.9  HCT 42.3  MCV 84.9  PLT 196   Cardiac Enzymes: No results found for this basename: CKTOTAL:5,CKMB:5,CKMBINDEX:5,TROPONINI:5 in the last 168 hours BNP: No components found with this basename: POCBNP:5 CBG:  Lab 03/15/12 1646 03/15/12 1220 03/15/12 0742  GLUCAP 118* 121* 159*   Dr. Karilyn Cota will make rounds tomorrow. Thank you Dr. Jerre Simon for this consult.  Time spent: 45 minutes  Theodore Rahrig L [facility]  If 7PM-7AM, please contact night-coverage [email or web address] Password [facility] 03/15/2012, 6:21 PM

## 2012-03-15 NOTE — Progress Notes (Signed)
No change in H&P on reexamination. 

## 2012-03-15 NOTE — Anesthesia Procedure Notes (Signed)
Spinal  Patient location during procedure: OR Start time: 03/15/2012 11:21 AM Staffing CRNA/Resident: ANDRAZA, Juanda Luba L Preanesthetic Checklist Completed: patient identified, site marked, surgical consent, pre-op evaluation, timeout performed, IV checked, risks and benefits discussed and monitors and equipment checked Spinal Block Patient position: right lateral decubitus Prep: Betadine Patient monitoring: heart rate, cardiac monitor, continuous pulse ox and blood pressure Approach: right paramedian Location: L3-4 Injection technique: single-shot Needle Needle type: Spinocan  Needle gauge: 22 G Needle length: 9 cm Assessment Sensory level: T8 Additional Notes ATTEMPTS:2 TRAY ZO:[id number] TRAY EXPIRATION DATE:11/2012  1121: Marcaine 12 mg, fentanyl 20 mcg injected intrathecally.  Patient tolerated well.

## 2012-03-16 DIAGNOSIS — I10 Essential (primary) hypertension: Secondary | ICD-10-CM

## 2012-03-16 DIAGNOSIS — E1129 Type 2 diabetes mellitus with other diabetic kidney complication: Secondary | ICD-10-CM

## 2012-03-16 DIAGNOSIS — N184 Chronic kidney disease, stage 4 (severe): Secondary | ICD-10-CM

## 2012-03-16 DIAGNOSIS — N058 Unspecified nephritic syndrome with other morphologic changes: Secondary | ICD-10-CM

## 2012-03-16 LAB — GLUCOSE, CAPILLARY: Glucose-Capillary: 177 mg/dL — ABNORMAL HIGH (ref 70–99)

## 2012-03-16 NOTE — Progress Notes (Signed)
Doing well urine sligtly pinkish.discharge home. Will see in one week in office.

## 2012-03-16 NOTE — Progress Notes (Signed)
Pt discharged home via personal vehicle driven by spouse. Discharge instructions and meds reviewed with pt with good understanding. No acute distress noted.

## 2012-03-16 NOTE — Progress Notes (Signed)
UR chart review completed.  

## 2012-03-16 NOTE — Op Note (Signed)
NAME:  Craig Castillo, Craig Castillo NO.:  [id number]  MEDICAL RECORD NO.:  [id number]  LOCATION:  A210                          FACILITY:  [facility]  PHYSICIAN:  Ky Barban, M.D.DATE OF BIRTH:  1932/06/15  DATE OF PROCEDURE:  03/15/2012 DATE OF DISCHARGE:                              OPERATIVE REPORT   PREOPERATIVE DIAGNOSIS:  Bladder tumor, recurrent.  POSTOPERATIVE DIAGNOSIS:  Bladder tumor, recurrent.  PROCEDURE:  TUR bladder tumor, multifocal.  PROCEDURE IN DETAIL:  The patient under spinal anesthesia in lithotomy position, usual prep and drape, #28 Iglesias resectoscope was introduced into the bladder.  The papillary growth was located at 6 o'clock position of the bladder neck, and a second focus was located on the right bladder wall.  That was also palpably in superficial.  First, the bladder neck focus was completely resected, and the surrounding area was fulgurated and the same thing was done for the right bladder wall focus. After that, the chips were removed, and there was no bleeding going on. Resectoscope was removed, and a #20 Foley catheter left in for drainage, which was clear.  The patient left the operating room in satisfactory condition.  I also want to mention that he has enlarged prostate with bladder neck obstruction, but he denies any urinary symptoms.     Ky Barban, M.D.     MIJ/MEDQ  D:  03/15/2012  T:  03/16/2012  Job:  [id number]

## 2012-03-16 NOTE — Progress Notes (Signed)
     Subjective: This man has no complaints. His blood pressure has improved.           Physical Exam: Blood pressure 150/74, pulse 69, temperature 98.1 F (36.7 C), temperature source Oral, resp. rate 20, height 5\' 8"  (1.727 m), weight 109.3 kg (240 lb 15.4 oz), SpO2 98.00%. He looks systemically well. Heart sounds are present and normal. Lung fields are clinically clear. He is alert and orientated.   Investigations:  Recent Results (from the past 240 hour(s))  SURGICAL PCR SCREEN     Status: Abnormal   Collection Time   03/08/12  1:40 PM      Component Value Range Status Comment   MRSA, PCR NEGATIVE  NEGATIVE Final    Staphylococcus aureus POSITIVE (*) NEGATIVE Final   URINE CULTURE     Status: Normal   Collection Time   03/11/12  7:52 PM      Component Value Range Status Comment   Specimen Description URINE, CLEAN CATCH   Final    Special Requests NONE   Final    Culture  Setup Time 03/12/2012 22:23   Final    Colony Count NO GROWTH   Final    Culture NO GROWTH   Final    Report Status 03/13/2012 FINAL   Final      Basic Metabolic Panel:  Basename 03/15/12 1405  NA 139  K 4.2  CL 104  CO2 27  GLUCOSE 111*  BUN 24*  CREATININE 1.75*  CALCIUM 9.2  MG --  PHOS --          Medications: I have reviewed the patient's current medications.  Impression: 1. Uncontrolled hypertension, improving. 2. Chronic kidney disease. 3. Type 2 diabetes mellitus. 4. TUR bladder by Dr. Jerre Simon on this admission.     Plan: 1. Continue with antihypertensive medications. I have no further recommendations and will sign off. He needs to followup with his primary care physician as an outpatient for control of his blood pressure.     LOS: 1 day   Wilson Singer Pager [phone]  03/16/2012, 8:00 AM

## 2012-03-16 NOTE — Anesthesia Postprocedure Evaluation (Signed)
  Anesthesia Post-op Note  Patient: Craig Castillo  Procedure(s) Performed: Procedure(s) (LRB) with comments: TRANSURETHRAL RESECTION OF BLADDER TUMOR (TURBT) (N/A)  Patient Location: Room 210  Anesthesia Type:Spinal  Level of Consciousness: awake, alert , oriented and patient cooperative  Airway and Oxygen Therapy: Patient Spontanous Breathing on room air  Post-op Pain: mild  Post-op Assessment: Post-op Vital signs reviewed, Patient's Cardiovascular Status Stable, Respiratory Function Stable, Patent Airway and No signs of Nausea or vomiting  Post-op Vital Signs: Reviewed and stable  Complications: No apparent anesthesia complications

## 2012-03-16 NOTE — Addendum Note (Signed)
Addendum  created 03/16/12 1225 by Marolyn Hammock, CRNA   Modules edited:Notes Section

## 2012-03-16 NOTE — Care Management Note (Signed)
    Page 1 of 1   03/16/2012     11:48:39 AM   CARE MANAGEMENT NOTE 03/16/2012  Patient:  Craig Castillo, Craig Castillo   Account Number:  [id number]  Date Initiated:  03/16/2012  Documentation initiated by:  Sharrie Rothman  Subjective/Objective Assessment:   Pt admitted from home s/p TURBT. Pt lives with his wife and will return home at discharge. Pt is independent with ADL's.     Action/Plan:   No CM or HH needs noted.   Anticipated DC Date:  03/16/2012   Anticipated DC Plan:  HOME/SELF CARE      DC Planning Services  CM consult      Choice offered to / List presented to:             Status of service:  Completed, signed off Medicare Important Message given?   (If response is "NO", the following Medicare IM given date fields will be blank) Date Medicare IM given:   Date Additional Medicare IM given:    Discharge Disposition:  HOME/SELF CARE  Per UR Regulation:    If discussed at Long Length of Stay Meetings, dates discussed:    Comments:  03/16/12 1147 Arlyss Queen, RN BSN CM

## 2012-03-17 ENCOUNTER — Encounter (HOSPITAL_COMMUNITY): Payer: Self-pay | Admitting: Urology

## 2012-06-01 ENCOUNTER — Encounter (HOSPITAL_COMMUNITY): Payer: Self-pay | Admitting: Pharmacy Technician

## 2012-06-13 ENCOUNTER — Encounter (HOSPITAL_COMMUNITY): Payer: Self-pay

## 2012-06-13 ENCOUNTER — Encounter (HOSPITAL_COMMUNITY)
Admission: RE | Admit: 2012-06-13 | Discharge: 2012-06-13 | Disposition: A | Discharge status: Home / Self Care | Payer: PRIVATE HEALTH INSURANCE | Source: Ambulatory Visit | Attending: Ophthalmology | Admitting: Ophthalmology

## 2012-06-13 LAB — BASIC METABOLIC PANEL
BUN: 22 mg/dL (ref 6–23)
Calcium: 9.5 mg/dL (ref 8.4–10.5)
GFR calc non Af Amer: 33 mL/min — ABNORMAL LOW (ref >90)
Glucose, Bld: 187 mg/dL — ABNORMAL HIGH (ref 70–99)
Sodium: 143 mEq/L (ref 135–145)

## 2012-06-13 LAB — HEMOGLOBIN AND HEMATOCRIT, BLOOD
HCT: 43.1 % (ref 39.0–52.0)
Hemoglobin: 15 g/dL (ref 13.0–17.0)

## 2012-06-13 NOTE — Patient Instructions (Addendum)
Your procedure is scheduled on: 06/20/2012  Report to [facility] at   700    AM.  Call this number if you have problems the morning of surgery: [phone]   Do not eat food or drink liquids :After Midnight.      Take these medicines the morning of surgery with A SIP OF WATER:norvasc,lopressor,prilosec,flomax. Take 1/2 of insulin dosage the night before your surgery.   Do not wear jewelry, make-up or nail polish.  Do not wear lotions, powders, or perfumes.   Do not shave 48 hours prior to surgery.  Do not bring valuables to the hospital.  Contacts, dentures or bridgework may not be worn into surgery.  Leave suitcase in the car. After surgery it may be brought to your room.  For patients admitted to the hospital, checkout time is 11:00 AM the day of discharge.   Patients discharged the day of surgery will not be allowed to drive home.  :     Please read over the following fact sheets that you were given: Coughing and Deep Breathing, Surgical Site Infection Prevention, Anesthesia Post-op Instructions and Care and Recovery After Surgery    Cataract A cataract is a clouding of the lens of the eye. When a lens becomes cloudy, vision is reduced based on the degree and nature of the clouding. Many cataracts reduce vision to some degree. Some cataracts make people more near-sighted as they develop. Other cataracts increase glare. Cataracts that are ignored and become worse can sometimes look white. The white color can be seen through the pupil. CAUSES   Aging. However, cataracts may occur at any age, even in newborns.   Certain drugs.   Trauma to the eye.   Certain diseases such as diabetes.   Specific eye diseases such as chronic inflammation inside the eye or a sudden attack of a rare form of glaucoma.   Inherited or acquired medical problems.  SYMPTOMS   Gradual, progressive drop in vision in the affected eye.   Severe, rapid visual loss. This most often happens when trauma is the  cause.  DIAGNOSIS  To detect a cataract, an eye doctor examines the lens. Cataracts are best diagnosed with an exam of the eyes with the pupils enlarged (dilated) by drops.  TREATMENT  For an early cataract, vision may improve by using different eyeglasses or stronger lighting. If that does not help your vision, surgery is the only effective treatment. A cataract needs to be surgically removed when vision loss interferes with your everyday activities, such as driving, reading, or watching TV. A cataract may also have to be removed if it prevents examination or treatment of another eye problem. Surgery removes the cloudy lens and usually replaces it with a substitute lens (intraocular lens, IOL).  At a time when both you and your doctor agree, the cataract will be surgically removed. If you have cataracts in both eyes, only one is usually removed at a time. This allows the operated eye to heal and be out of danger from any possible problems after surgery (such as infection or poor wound healing). In rare cases, a cataract may be doing damage to your eye. In these cases, your caregiver may advise surgical removal right away. The vast majority of people who have cataract surgery have better vision afterward. HOME CARE INSTRUCTIONS  If you are not planning surgery, you may be asked to do the following:  Use different eyeglasses.   Use stronger or brighter lighting.   Ask  your eye doctor about reducing your medicine dose or changing medicines if it is thought that a medicine caused your cataract. Changing medicines does not make the cataract go away on its own.   Become familiar with your surroundings. Poor vision can lead to injury. Avoid bumping into things on the affected side. You are at a higher risk for tripping or falling.   Exercise extreme care when driving or operating machinery.   Wear sunglasses if you are sensitive to bright light or experiencing problems with glare.  SEEK IMMEDIATE  MEDICAL CARE IF:   You have a worsening or sudden vision loss.   You notice redness, swelling, or increasing pain in the eye.   You have a fever.  Document Released: 05/11/2005 Document Revised: 04/30/2011 Document Reviewed: 01/02/2011 [facility] Patient Information 2012 [place], [place].PATIENT INSTRUCTIONS POST-ANESTHESIA  IMMEDIATELY FOLLOWING SURGERY:  Do not drive or operate machinery for the first twenty four hours after surgery.  Do not make any important decisions for twenty four hours after surgery or while taking narcotic pain medications or sedatives.  If you develop intractable nausea and vomiting or a severe headache please notify your doctor immediately.  FOLLOW-UP:  Please make an appointment with your surgeon as instructed. You do not need to follow up with anesthesia unless specifically instructed to do so.  WOUND CARE INSTRUCTIONS (if applicable):  Keep a dry clean dressing on the anesthesia/puncture wound site if there is drainage.  Once the wound has quit draining you may leave it open to air.  Generally you should leave the bandage intact for twenty four hours unless there is drainage.  If the epidural site drains for more than 36-48 hours please call the anesthesia department.  QUESTIONS?:  Please feel free to call your physician or the hospital operator if you have any questions, and they will be happy to assist you.

## 2012-06-13 NOTE — Progress Notes (Signed)
06/13/12 1000  OBSTRUCTIVE SLEEP APNEA  Have you ever been diagnosed with sleep apnea through a sleep study? No  Do you snore loudly (loud enough to be heard through closed doors)?  0  Do you often feel tired, fatigued, or sleepy during the daytime? 0  Has anyone observed you stop breathing during your sleep? 0  Do you have, or are you being treated for high blood pressure? 1  BMI more than 35 kg/m2? 1  Age over 77 years old? 1  Neck circumference greater than 40 cm/18 inches? 0  Gender: 1  Obstructive Sleep Apnea Score 4   Score 4 or greater  Results sent to PCP

## 2012-06-20 ENCOUNTER — Encounter (HOSPITAL_COMMUNITY): Payer: Self-pay | Admitting: Anesthesiology

## 2012-06-20 ENCOUNTER — Encounter (HOSPITAL_COMMUNITY): Payer: Self-pay | Admitting: Ophthalmology

## 2012-06-20 ENCOUNTER — Ambulatory Visit (HOSPITAL_COMMUNITY)
Admission: RE | Admit: 2012-06-20 | Discharge: 2012-06-20 | Disposition: A | Discharge status: Home / Self Care | Payer: PRIVATE HEALTH INSURANCE | Source: Ambulatory Visit | Attending: Ophthalmology | Admitting: Ophthalmology

## 2012-06-20 ENCOUNTER — Encounter (HOSPITAL_COMMUNITY): Payer: Self-pay | Admitting: *Deleted

## 2012-06-20 ENCOUNTER — Ambulatory Visit (HOSPITAL_COMMUNITY): Payer: PRIVATE HEALTH INSURANCE | Admitting: Anesthesiology

## 2012-06-20 ENCOUNTER — Encounter (HOSPITAL_COMMUNITY)
Admission: RE | Disposition: A | Discharge status: Home / Self Care | Payer: Self-pay | Source: Ambulatory Visit | Attending: Ophthalmology

## 2012-06-20 DIAGNOSIS — H2510 Age-related nuclear cataract, unspecified eye: Secondary | ICD-10-CM | POA: Insufficient documentation

## 2012-06-20 DIAGNOSIS — Z794 Long term (current) use of insulin: Secondary | ICD-10-CM | POA: Insufficient documentation

## 2012-06-20 DIAGNOSIS — I10 Essential (primary) hypertension: Secondary | ICD-10-CM | POA: Insufficient documentation

## 2012-06-20 DIAGNOSIS — Z01812 Encounter for preprocedural laboratory examination: Secondary | ICD-10-CM | POA: Insufficient documentation

## 2012-06-20 DIAGNOSIS — E119 Type 2 diabetes mellitus without complications: Secondary | ICD-10-CM | POA: Insufficient documentation

## 2012-06-20 HISTORY — PX: CATARACT EXTRACTION W/PHACO: SHX586

## 2012-06-20 LAB — GLUCOSE, CAPILLARY: Glucose-Capillary: 134 mg/dL — ABNORMAL HIGH (ref 70–99)

## 2012-06-20 SURGERY — PHACOEMULSIFICATION, CATARACT, WITH IOL INSERTION
Anesthesia: Monitor Anesthesia Care | Site: Eye | Laterality: Right | Wound class: Clean

## 2012-06-20 MED ORDER — LIDOCAINE HCL 3.5 % OP GEL
1.0000 "application " | Freq: Once | OPHTHALMIC | Status: AC
  Administered 2012-06-20: 1 via OPHTHALMIC

## 2012-06-20 MED ORDER — POVIDONE-IODINE 5 % OP SOLN
OPHTHALMIC | Status: DC | PRN
  Administered 2012-06-20: 1 via OPHTHALMIC

## 2012-06-20 MED ORDER — TETRACAINE HCL 0.5 % OP SOLN
1.0000 [drp] | OPHTHALMIC | Status: AC
  Administered 2012-06-20 (×3): 1 [drp] via OPHTHALMIC

## 2012-06-20 MED ORDER — EPINEPHRINE HCL 1 MG/ML IJ SOLN
INTRAMUSCULAR | Status: DC | PRN
  Administered 2012-06-20: 09:00:00

## 2012-06-20 MED ORDER — LACTATED RINGERS IV SOLN
INTRAVENOUS | Status: DC
  Administered 2012-06-20: 1000 mL via INTRAVENOUS

## 2012-06-20 MED ORDER — PROVISC 10 MG/ML IO SOLN
INTRAOCULAR | Status: DC | PRN
  Administered 2012-06-20: 8.5 mg via INTRAOCULAR

## 2012-06-20 MED ORDER — MIDAZOLAM HCL 2 MG/2ML IJ SOLN
1.0000 mg | INTRAMUSCULAR | Status: DC | PRN
  Administered 2012-06-20: 2 mg via INTRAVENOUS

## 2012-06-20 MED ORDER — NEOMYCIN-POLYMYXIN-DEXAMETH 0.1 % OP OINT
TOPICAL_OINTMENT | OPHTHALMIC | Status: DC | PRN
  Administered 2012-06-20: 1 via OPHTHALMIC

## 2012-06-20 MED ORDER — LIDOCAINE 3.5 % OP GEL OPTIME - NO CHARGE
OPHTHALMIC | Status: DC | PRN
  Administered 2012-06-20: 1 [drp] via OPHTHALMIC

## 2012-06-20 MED ORDER — MIDAZOLAM HCL 2 MG/2ML IJ SOLN
INTRAMUSCULAR | Status: AC
  Filled 2012-06-20: qty 2

## 2012-06-20 MED ORDER — PHENYLEPHRINE HCL 2.5 % OP SOLN
1.0000 [drp] | OPHTHALMIC | Status: AC
  Administered 2012-06-20 (×3): 1 [drp] via OPHTHALMIC

## 2012-06-20 MED ORDER — CYCLOPENTOLATE-PHENYLEPHRINE 0.2-1 % OP SOLN
1.0000 [drp] | OPHTHALMIC | Status: AC
  Administered 2012-06-20 (×3): 1 [drp] via OPHTHALMIC

## 2012-06-20 MED ORDER — LIDOCAINE HCL (PF) 1 % IJ SOLN
INTRAOCULAR | Status: DC | PRN
  Administered 2012-06-20: 09:00:00 via OPHTHALMIC

## 2012-06-20 MED ORDER — EPINEPHRINE HCL 1 MG/ML IJ SOLN
INTRAMUSCULAR | Status: AC
  Filled 2012-06-20: qty 2

## 2012-06-20 MED ORDER — BSS IO SOLN
INTRAOCULAR | Status: DC | PRN
  Administered 2012-06-20: 15 mL via INTRAOCULAR

## 2012-06-20 SURGICAL SUPPLY — 11 items
CLOTH BEACON ORANGE TIMEOUT ST (SAFETY) ×1 IMPLANT
EYE SHIELD UNIVERSAL CLEAR (GAUZE/BANDAGES/DRESSINGS) ×1 IMPLANT
GLOVE BIOGEL PI IND STRL 6.5 (GLOVE) IMPLANT
GLOVE BIOGEL PI INDICATOR 6.5 (GLOVE) ×1
GLOVE EXAM NITRILE MD LF STRL (GLOVE) ×2 IMPLANT
PAD ARMBOARD 7.5X6 YLW CONV (MISCELLANEOUS) ×1 IMPLANT
SIGHTPATH CAT PROC W REG LENS (Ophthalmic Related) ×2 IMPLANT
SYR TB 1ML LL NO SAFETY (SYRINGE) ×1 IMPLANT
TAPE SURG TRANSPORE 1 IN (GAUZE/BANDAGES/DRESSINGS) IMPLANT
TAPE SURGICAL TRANSPORE 1 IN (GAUZE/BANDAGES/DRESSINGS) ×1
WATER STERILE IRR 250ML POUR (IV SOLUTION) ×1 IMPLANT

## 2012-06-20 NOTE — Brief Op Note (Signed)
Pre-Op Dx: Cataract OD Post-Op Dx: Cataract OD Surgeon: Windell Musson Anesthesia: Topical with MAC Surgery: Cataract Extraction with Intraocular lens Implant OD Implant: B&L enVista Specimen: None Complications: None 

## 2012-06-20 NOTE — Anesthesia Preprocedure Evaluation (Addendum)
Anesthesia Evaluation  Patient identified by MRN, date of birth, ID band Patient awake    Reviewed: Allergy & Precautions, H&P , NPO status , Patient's Chart, lab work & pertinent test results, reviewed documented beta blocker date and time   History of Anesthesia Complications Negative for: history of anesthetic complications  Airway Mallampati: I TM Distance: >3 FB     Dental  (+) Teeth Intact and Partial Upper   Pulmonary shortness of breath and with exertion, former smoker,  breath sounds clear to auscultation        Cardiovascular hypertension, Pt. on medications and Pt. on home beta blockers - angina+ CAD, + Past MI and + Cardiac Stents Rhythm:Irregular Rate:Normal     Neuro/Psych negative neurological ROS  negative psych ROS   GI/Hepatic Neg liver ROS, GERD-  Medicated and Controlled,  Endo/Other  diabetes, Type 2, Insulin DependentMorbid obesity  Renal/GU Renal InsufficiencyRenal disease     Musculoskeletal  (+) Arthritis -, Osteoarthritis,    Abdominal (+) + obese,  Abdomen: soft.    Peds  Hematology   Anesthesia Other Findings   Reproductive/Obstetrics                           Anesthesia Physical Anesthesia Plan  ASA: III  Anesthesia Plan: MAC   Post-op Pain Management:    Induction: Intravenous  Airway Management Planned: Nasal Cannula  Additional Equipment:   Intra-op Plan:   Post-operative Plan:   Informed Consent: I have reviewed the patients History and Physical, chart, labs and discussed the procedure including the risks, benefits and alternatives for the proposed anesthesia with the patient or authorized representative who has indicated his/her understanding and acceptance.     Plan Discussed with: CRNA  Anesthesia Plan Comments:         Anesthesia Quick Evaluation  

## 2012-06-20 NOTE — Addendum Note (Signed)
Addendum  created 06/20/12 0909 by Jahfari Ambers, MD   Modules edited:Charting, Inpatient Notes    

## 2012-06-20 NOTE — Anesthesia Procedure Notes (Signed)
Procedure Name: MAC Date/Time: 06/20/2012 8:35 AM Performed by: Franco Nones Pre-anesthesia Checklist: Patient identified, Emergency Drugs available, Suction available, Timeout performed and Patient being monitored Patient Re-evaluated:Patient Re-evaluated prior to inductionOxygen Delivery Method: Nasal Cannula

## 2012-06-20 NOTE — Addendum Note (Signed)
Addendum  created 06/20/12 0909 by Roselie Awkward, MD   Modules edited:Charting, Inpatient Notes

## 2012-06-20 NOTE — Anesthesia Postprocedure Evaluation (Signed)
  Anesthesia Post-op Note  Patient: Craig Castillo  Procedure(s) Performed: Procedure(s) (LRB): CATARACT EXTRACTION PHACO AND INTRAOCULAR LENS PLACEMENT (IOC) (Right)  Patient Location:  Short Stay  Anesthesia Type: MAC  Level of Consciousness: awake  Airway and Oxygen Therapy: Patient Spontanous Breathing  Post-op Pain: none  Post-op Assessment: Post-op Vital signs reviewed, Patient's Cardiovascular Status Stable, Respiratory Function Stable, Patent Airway, No signs of Nausea or vomiting and Pain level controlled  Post-op Vital Signs: Reviewed and stable  Complications: No apparent anesthesia complications

## 2012-06-20 NOTE — H&P (Signed)
I have reviewed the H&P, the patient was re-examined, and I have identified no interval changes in medical condition and plan of care since the history and physical of record  

## 2012-06-20 NOTE — Transfer of Care (Signed)
Immediate Anesthesia Transfer of Care Note  Patient: Craig Castillo  Procedure(s) Performed: Procedure(s) (LRB): CATARACT EXTRACTION PHACO AND INTRAOCULAR LENS PLACEMENT (IOC) (Right)  Patient Location: Shortstay  Anesthesia Type: MAC  Level of Consciousness: awake  Airway & Oxygen Therapy: Patient Spontanous Breathing   Post-op Assessment: Report given to PACU RN, Post -op Vital signs reviewed and stable and Patient moving all extremities  Post vital signs: Reviewed and stable  Complications: No apparent anesthesia complications

## 2012-06-21 ENCOUNTER — Encounter (HOSPITAL_COMMUNITY): Payer: Self-pay | Admitting: Ophthalmology

## 2012-06-21 NOTE — Op Note (Signed)
NAME:  Craig Castillo, Craig Castillo NO.:  [id number]  MEDICAL RECORD NO.:  [id number]  LOCATION:  [facility]                          FACILITY:  [facility]  PHYSICIAN:  Susanne Greenhouse, MD       DATE OF BIRTH:  07/24/1932  DATE OF PROCEDURE:  06/20/2012 DATE OF DISCHARGE:  06/20/2012                              OPERATIVE REPORT   PREOPERATIVE DIAGNOSIS:  Nuclear cataract, right eye, diagnosis code 366.16.  POSTOPERATIVE DIAGNOSIS:  Nuclear cataract, right eye, diagnosis code 366.16.  ANESTHESIA:  Topical with  monitored anesthesia care and IV sedation.   OPERATION PERFORMED: Phacoemulsification, intraocular lens implantation, right eye  OPERATIVE SUMMARY:  In the preoperative area, dilating drops were placed into the right eye.  The patient was then brought into the operating room where she was placed under topical anesthesia and IV sedation.  The eye was then prepped and draped.  Beginning with a 75 blade, a paracentesis port was made at the surgeon's 2 o'clock position.  The anterior chamber was then filled with a 1% nonpreserved lidocaine solution with epinephrine.  This was followed by Viscoat to deepen the chamber.  A small fornix-based peritomy was performed superiorly.  Next, a single iris hook was placed through the limbus superiorly.  A 2.4-mm keratome blade was then used to make a clear corneal incision over the iris hook.  A bent cystotome needle and Utrata forceps were used to create a continuous tear capsulotomy.  Hydrodissection was performed using balanced salt solution on a fine cannula.  The lens nucleus was then removed using phacoemulsification in a quadrant cracking technique. The cortical material was then removed with irrigation and aspiration. The capsular bag and anterior chamber were refilled with Provisc.  The wound was widened to approximately 3 mm and a posterior chamber intraocular lens was placed into the capsular bag without difficulty using an [employer] lens injecting system.  A single 10-0 nylon suture was then used to close the incision as well as stromal hydration. The Provisc was removed from the anterior chamber and capsular bag with irrigation and aspiration.  At this point, the wounds were tested for leak, which were negative.  The anterior chamber remained deep and stable.  The patient tolerated the procedure well.  There were no operative complications, and she awoke from topical anesthesia and IV sedation without problem.  No surgical specimens.  Prosthetic device used.  Bausch and Lomb posterior chamber lens, model EnVista, model number MX60, power of 20.5, serial number is [id number].          ______________________________ Susanne Greenhouse, MD     KEH/MEDQ  D:  06/20/2012  T:  06/20/2012  Job:  [id number]

## 2012-06-27 ENCOUNTER — Encounter (HOSPITAL_COMMUNITY): Payer: Self-pay | Admitting: Pharmacy Technician

## 2012-06-29 ENCOUNTER — Encounter (HOSPITAL_COMMUNITY)
Admission: RE | Admit: 2012-06-29 | Discharge: 2012-06-29 | Disposition: A | Discharge status: Home / Self Care | Payer: PRIVATE HEALTH INSURANCE | Source: Ambulatory Visit | Attending: Ophthalmology | Admitting: Ophthalmology

## 2012-06-29 ENCOUNTER — Encounter (HOSPITAL_COMMUNITY): Payer: Self-pay

## 2012-06-29 MED ORDER — FENTANYL CITRATE 0.05 MG/ML IJ SOLN: 25.0000 ug | INTRAMUSCULAR | Status: DC | PRN

## 2012-06-29 MED ORDER — ONDANSETRON HCL 4 MG/2ML IJ SOLN: 4.0000 mg | Freq: Once | INTRAMUSCULAR | Status: AC | PRN

## 2012-07-01 MED ORDER — NEOMYCIN-POLYMYXIN-DEXAMETH 3.5-10000-0.1 OP OINT
TOPICAL_OINTMENT | OPHTHALMIC | Status: AC
  Filled 2012-07-01: qty 3.5

## 2012-07-01 MED ORDER — TETRACAINE HCL 0.5 % OP SOLN
OPHTHALMIC | Status: AC
  Filled 2012-07-01: qty 2

## 2012-07-01 MED ORDER — CYCLOPENTOLATE-PHENYLEPHRINE 0.2-1 % OP SOLN
OPHTHALMIC | Status: AC
  Filled 2012-07-01: qty 2

## 2012-07-01 MED ORDER — PHENYLEPHRINE HCL 2.5 % OP SOLN
OPHTHALMIC | Status: AC
  Filled 2012-07-01: qty 2

## 2012-07-01 MED ORDER — LIDOCAINE HCL 3.5 % OP GEL
OPHTHALMIC | Status: AC
  Filled 2012-07-01: qty 5

## 2012-07-01 MED ORDER — LIDOCAINE HCL (PF) 1 % IJ SOLN
INTRAMUSCULAR | Status: AC
  Filled 2012-07-01: qty 2

## 2012-07-04 ENCOUNTER — Encounter (HOSPITAL_COMMUNITY): Payer: Self-pay | Admitting: *Deleted

## 2012-07-04 ENCOUNTER — Encounter (HOSPITAL_COMMUNITY): Payer: Self-pay | Admitting: Anesthesiology

## 2012-07-04 ENCOUNTER — Encounter (HOSPITAL_COMMUNITY)
Admission: RE | Disposition: A | Discharge status: Home / Self Care | Payer: Self-pay | Source: Ambulatory Visit | Attending: Ophthalmology

## 2012-07-04 ENCOUNTER — Ambulatory Visit (HOSPITAL_COMMUNITY)
Admission: RE | Admit: 2012-07-04 | Discharge: 2012-07-04 | Disposition: A | Discharge status: Home / Self Care | Payer: PRIVATE HEALTH INSURANCE | Source: Ambulatory Visit | Attending: Ophthalmology | Admitting: Ophthalmology

## 2012-07-04 ENCOUNTER — Ambulatory Visit (HOSPITAL_COMMUNITY): Payer: PRIVATE HEALTH INSURANCE | Admitting: Anesthesiology

## 2012-07-04 DIAGNOSIS — Z794 Long term (current) use of insulin: Secondary | ICD-10-CM | POA: Insufficient documentation

## 2012-07-04 DIAGNOSIS — Z79899 Other long term (current) drug therapy: Secondary | ICD-10-CM | POA: Insufficient documentation

## 2012-07-04 DIAGNOSIS — H2510 Age-related nuclear cataract, unspecified eye: Secondary | ICD-10-CM | POA: Insufficient documentation

## 2012-07-04 DIAGNOSIS — E119 Type 2 diabetes mellitus without complications: Secondary | ICD-10-CM | POA: Insufficient documentation

## 2012-07-04 DIAGNOSIS — Z01812 Encounter for preprocedural laboratory examination: Secondary | ICD-10-CM | POA: Insufficient documentation

## 2012-07-04 HISTORY — PX: CATARACT EXTRACTION W/PHACO: SHX586

## 2012-07-04 LAB — GLUCOSE, CAPILLARY: Glucose-Capillary: 142 mg/dL — ABNORMAL HIGH (ref 70–99)

## 2012-07-04 SURGERY — PHACOEMULSIFICATION, CATARACT, WITH IOL INSERTION
Anesthesia: Monitor Anesthesia Care | Site: Eye | Laterality: Left | Wound class: Clean

## 2012-07-04 MED ORDER — LIDOCAINE HCL (PF) 1 % IJ SOLN
INTRAOCULAR | Status: DC | PRN
  Administered 2012-07-04: 10:00:00 via OPHTHALMIC

## 2012-07-04 MED ORDER — EPINEPHRINE HCL 1 MG/ML IJ SOLN
INTRAMUSCULAR | Status: AC
Start: 2012-07-04 — End: 2012-07-04
  Filled 2012-07-04: qty 1

## 2012-07-04 MED ORDER — MIDAZOLAM HCL 2 MG/2ML IJ SOLN
1.0000 mg | INTRAMUSCULAR | Status: DC | PRN
  Administered 2012-07-04: 2 mg via INTRAVENOUS

## 2012-07-04 MED ORDER — PROVISC 10 MG/ML IO SOLN
INTRAOCULAR | Status: DC | PRN
  Administered 2012-07-04: 8.5 mg via INTRAOCULAR

## 2012-07-04 MED ORDER — LIDOCAINE HCL (PF) 1 % IJ SOLN: INTRAMUSCULAR | Status: DC | PRN

## 2012-07-04 MED ORDER — EPINEPHRINE HCL 1 MG/ML IJ SOLN
INTRAMUSCULAR | Status: AC
  Filled 2012-07-04: qty 1

## 2012-07-04 MED ORDER — POVIDONE-IODINE 5 % OP SOLN
OPHTHALMIC | Status: DC | PRN
  Administered 2012-07-04: 1 via OPHTHALMIC

## 2012-07-04 MED ORDER — NEOMYCIN-POLYMYXIN-DEXAMETH 0.1 % OP OINT
TOPICAL_OINTMENT | OPHTHALMIC | Status: DC | PRN
  Administered 2012-07-04: 1 via OPHTHALMIC

## 2012-07-04 MED ORDER — LIDOCAINE HCL 3.5 % OP GEL
1.0000 "application " | Freq: Once | OPHTHALMIC | Status: AC
  Administered 2012-07-04: 1 via OPHTHALMIC

## 2012-07-04 MED ORDER — LIDOCAINE 3.5 % OP GEL OPTIME - NO CHARGE
OPHTHALMIC | Status: DC | PRN
  Administered 2012-07-04: 1 [drp] via OPHTHALMIC

## 2012-07-04 MED ORDER — EPINEPHRINE HCL 1 MG/ML IJ SOLN
INTRAOCULAR | Status: DC | PRN
  Administered 2012-07-04: 10:00:00

## 2012-07-04 MED ORDER — PHENYLEPHRINE HCL 2.5 % OP SOLN
1.0000 [drp] | OPHTHALMIC | Status: AC
  Administered 2012-07-04 (×3): 1 [drp] via OPHTHALMIC

## 2012-07-04 MED ORDER — LACTATED RINGERS IV SOLN
INTRAVENOUS | Status: DC
  Administered 2012-07-04: 09:00:00 via INTRAVENOUS

## 2012-07-04 MED ORDER — TETRACAINE HCL 0.5 % OP SOLN
1.0000 [drp] | OPHTHALMIC | Status: AC
  Administered 2012-07-04 (×3): 1 [drp] via OPHTHALMIC

## 2012-07-04 MED ORDER — ONDANSETRON HCL 4 MG/2ML IJ SOLN: 4.0000 mg | Freq: Once | INTRAMUSCULAR | Status: DC | PRN

## 2012-07-04 MED ORDER — CYCLOPENTOLATE-PHENYLEPHRINE 0.2-1 % OP SOLN
1.0000 [drp] | OPHTHALMIC | Status: AC
  Administered 2012-07-04 (×3): 1 [drp] via OPHTHALMIC

## 2012-07-04 MED ORDER — BSS IO SOLN
INTRAOCULAR | Status: DC | PRN
  Administered 2012-07-04: 15 mL via INTRAOCULAR

## 2012-07-04 MED ORDER — FENTANYL CITRATE 0.05 MG/ML IJ SOLN: 25.0000 ug | INTRAMUSCULAR | Status: DC | PRN

## 2012-07-04 MED ORDER — MIDAZOLAM HCL 2 MG/2ML IJ SOLN
INTRAMUSCULAR | Status: AC
  Filled 2012-07-04: qty 2

## 2012-07-04 SURGICAL SUPPLY — 32 items

## 2012-07-04 NOTE — Transfer of Care (Signed)
Immediate Anesthesia Transfer of Care Note  Patient: Craig Castillo  Procedure(s) Performed: Procedure(s) with comments: CATARACT EXTRACTION PHACO AND INTRAOCULAR LENS PLACEMENT (IOC) (Left) - CDE: 24.33  Patient Location: Short Stay  Anesthesia Type:MAC  Level of Consciousness: awake, alert , oriented and patient cooperative  Airway & Oxygen Therapy: Patient Spontanous Breathing  Post-op Assessment: Report given to PACU RN and Post -op Vital signs reviewed and stable  Post vital signs: Reviewed and stable  Complications: No apparent anesthesia complications

## 2012-07-04 NOTE — Anesthesia Postprocedure Evaluation (Signed)
  Anesthesia Post-op Note  Patient: Craig Castillo  Procedure(s) Performed: Procedure(s) with comments: CATARACT EXTRACTION PHACO AND INTRAOCULAR LENS PLACEMENT (IOC) (Left) - CDE: 24.33  Patient Location: Short Stay  Anesthesia Type:MAC  Level of Consciousness: awake, alert , oriented and patient cooperative  Airway and Oxygen Therapy: Patient Spontanous Breathing  Post-op Pain: none  Post-op Assessment: Post-op Vital signs reviewed, Patient's Cardiovascular Status Stable, Respiratory Function Stable, Patent Airway, No signs of Nausea or vomiting and Pain level controlled  Post-op Vital Signs: Reviewed and stable  Complications: No apparent anesthesia complications

## 2012-07-04 NOTE — Brief Op Note (Signed)
Pre-Op Dx: Cataract OS Post-Op Dx: Cataract OS Surgeon: Charlotta Lapaglia Anesthesia: Topical with MAC Surgery: Cataract Extraction with Intraocular lens Implant OS Implant: B&L enVista Specimen: None Complications: None 

## 2012-07-04 NOTE — Op Note (Signed)
NAME:  CHOU, BUSLER NO.:  [id number]  MEDICAL RECORD NO.:  [id number]  LOCATION:  [facility]                          FACILITY:  [facility]  PHYSICIAN:  Susanne Greenhouse, MD       DATE OF BIRTH:  02-06-1933  DATE OF PROCEDURE:  07/04/2012 DATE OF DISCHARGE:  07/04/2012                              OPERATIVE REPORT   PREOPERATIVE DIAGNOSIS:  Nuclear cataract, left eye, diagnosis code 366.16.  POSTOPERATIVE DIAGNOSIS:  Nuclear cataract, left eye, diagnosis code 366.16.  ANESTHESIA:  Topical with monitored anesthesia care and IV sedation.  OPERATION PERFORMED:  Phacoemulsification of posterior chamber intraocular lens implantation, left eye.  OPERATIVE SUMMARY:  In the preoperative area, dilating drops were placed into the left eye.  The patient was then brought into the operating room where she was placed under topical anesthesia and IV sedation.  The eye was then prepped and draped.  Beginning with a 75 blade, a paracentesis port was made at the surgeon's 2 o'clock position.  The anterior chamber was then filled with a 1% nonpreserved lidocaine solution with epinephrine.  This was followed by Viscoat to deepen the chamber.  A small fornix-based peritomy was performed superiorly.  Next, a single iris hook was placed through the limbus superiorly.  A 2.4-mm keratome blade was then used to make a clear corneal incision over the iris hook. A bent cystotome needle and Utrata forceps were used to create a continuous tear capsulotomy.  Hydrodissection was performed using balanced salt solution on a fine cannula.  The lens nucleus was then removed using phacoemulsification in a quadrant cracking technique.  The cortical material was then removed with irrigation and aspiration.  The capsular bag and anterior chamber were refilled with Provisc.  The wound was widened to approximately 3 mm and a posterior chamber intraocular lens was placed into the capsular bag without difficulty  using an [employer] lens injecting system.  A single 10-0 nylon suture was then used to close the incision as well as stromal hydration.  The Provisc was removed from the anterior chamber and capsular bag with irrigation and aspiration.  At this point, the wounds were tested for leak, which were negative.  The anterior chamber remained deep and stable.  The patient tolerated the procedure well.  There were no operative complications, and she awoke from topical anesthesia and IV sedation without problem. No surgical specimens.  Prosthetic device used is Bausch and Lomb, posterior chamber lens, model EnVista, model number MX60, power of 19.5, serial number is [id number].         ______________________________ Susanne Greenhouse, MD    KEH/MEDQ  D:  07/04/2012  T:  07/04/2012  Job:  [id number]

## 2012-07-04 NOTE — H&P (Signed)
I have reviewed the H&P, the patient was re-examined, and I have identified no interval changes in medical condition and plan of care since the history and physical of record  

## 2012-07-04 NOTE — Anesthesia Preprocedure Evaluation (Signed)
Anesthesia Evaluation  Patient identified by MRN, date of birth, ID band Patient awake    Reviewed: Allergy & Precautions, H&P , NPO status , Patient's Chart, lab work & pertinent test results, reviewed documented beta blocker date and time   History of Anesthesia Complications Negative for: history of anesthetic complications  Airway Mallampati: I TM Distance: >3 FB     Dental  (+) Teeth Intact and Partial Upper   Pulmonary shortness of breath and with exertion, former smoker,  breath sounds clear to auscultation        Cardiovascular hypertension, Pt. on medications and Pt. on home beta blockers - angina+ CAD, + Past MI and + Cardiac Stents Rhythm:Irregular Rate:Normal     Neuro/Psych negative neurological ROS  negative psych ROS   GI/Hepatic Neg liver ROS, GERD-  Medicated and Controlled,  Endo/Other  diabetes, Type 2, Insulin DependentMorbid obesity  Renal/GU Renal InsufficiencyRenal disease     Musculoskeletal  (+) Arthritis -, Osteoarthritis,    Abdominal (+) + obese,  Abdomen: soft.    Peds  Hematology   Anesthesia Other Findings   Reproductive/Obstetrics                           Anesthesia Physical Anesthesia Plan  ASA: III  Anesthesia Plan: MAC   Post-op Pain Management:    Induction: Intravenous  Airway Management Planned: Nasal Cannula  Additional Equipment:   Intra-op Plan:   Post-operative Plan:   Informed Consent: I have reviewed the patients History and Physical, chart, labs and discussed the procedure including the risks, benefits and alternatives for the proposed anesthesia with the patient or authorized representative who has indicated his/her understanding and acceptance.     Plan Discussed with: CRNA  Anesthesia Plan Comments:         Anesthesia Quick Evaluation

## 2012-07-05 ENCOUNTER — Encounter (HOSPITAL_COMMUNITY): Payer: Self-pay | Admitting: Ophthalmology

## 2012-09-14 ENCOUNTER — Encounter (HOSPITAL_COMMUNITY): Payer: Self-pay

## 2012-09-14 ENCOUNTER — Emergency Department (HOSPITAL_COMMUNITY)
Admission: EM | Admit: 2012-09-14 | Discharge: 2012-09-14 | Disposition: A | Discharge status: Home / Self Care | Payer: PRIVATE HEALTH INSURANCE | Attending: Emergency Medicine | Admitting: Emergency Medicine

## 2012-09-14 ENCOUNTER — Emergency Department (HOSPITAL_COMMUNITY): Payer: PRIVATE HEALTH INSURANCE

## 2012-09-14 DIAGNOSIS — I252 Old myocardial infarction: Secondary | ICD-10-CM | POA: Insufficient documentation

## 2012-09-14 DIAGNOSIS — Z8739 Personal history of other diseases of the musculoskeletal system and connective tissue: Secondary | ICD-10-CM | POA: Insufficient documentation

## 2012-09-14 DIAGNOSIS — R509 Fever, unspecified: Secondary | ICD-10-CM | POA: Insufficient documentation

## 2012-09-14 DIAGNOSIS — Z8679 Personal history of other diseases of the circulatory system: Secondary | ICD-10-CM | POA: Insufficient documentation

## 2012-09-14 DIAGNOSIS — R059 Cough, unspecified: Secondary | ICD-10-CM | POA: Insufficient documentation

## 2012-09-14 DIAGNOSIS — Z8551 Personal history of malignant neoplasm of bladder: Secondary | ICD-10-CM | POA: Insufficient documentation

## 2012-09-14 DIAGNOSIS — Z79899 Other long term (current) drug therapy: Secondary | ICD-10-CM | POA: Insufficient documentation

## 2012-09-14 DIAGNOSIS — Z794 Long term (current) use of insulin: Secondary | ICD-10-CM | POA: Insufficient documentation

## 2012-09-14 DIAGNOSIS — IMO0001 Reserved for inherently not codable concepts without codable children: Secondary | ICD-10-CM | POA: Insufficient documentation

## 2012-09-14 DIAGNOSIS — E119 Type 2 diabetes mellitus without complications: Secondary | ICD-10-CM | POA: Insufficient documentation

## 2012-09-14 DIAGNOSIS — N400 Enlarged prostate without lower urinary tract symptoms: Secondary | ICD-10-CM | POA: Insufficient documentation

## 2012-09-14 DIAGNOSIS — I129 Hypertensive chronic kidney disease with stage 1 through stage 4 chronic kidney disease, or unspecified chronic kidney disease: Secondary | ICD-10-CM | POA: Insufficient documentation

## 2012-09-14 DIAGNOSIS — R05 Cough: Secondary | ICD-10-CM

## 2012-09-14 DIAGNOSIS — N183 Chronic kidney disease, stage 3 unspecified: Secondary | ICD-10-CM | POA: Insufficient documentation

## 2012-09-14 DIAGNOSIS — I2510 Atherosclerotic heart disease of native coronary artery without angina pectoris: Secondary | ICD-10-CM | POA: Insufficient documentation

## 2012-09-14 DIAGNOSIS — E785 Hyperlipidemia, unspecified: Secondary | ICD-10-CM | POA: Insufficient documentation

## 2012-09-14 DIAGNOSIS — Z8709 Personal history of other diseases of the respiratory system: Secondary | ICD-10-CM | POA: Insufficient documentation

## 2012-09-14 DIAGNOSIS — Z87891 Personal history of nicotine dependence: Secondary | ICD-10-CM | POA: Insufficient documentation

## 2012-09-14 DIAGNOSIS — Z9861 Coronary angioplasty status: Secondary | ICD-10-CM | POA: Insufficient documentation

## 2012-09-14 LAB — GLUCOSE, CAPILLARY: Glucose-Capillary: 154 mg/dL — ABNORMAL HIGH (ref 70–99)

## 2012-09-14 MED ORDER — ACETAMINOPHEN 500 MG PO TABS
ORAL_TABLET | ORAL | Status: AC
  Filled 2012-09-14: qty 2

## 2012-09-14 MED ORDER — ACETAMINOPHEN 500 MG PO TABS
1000.0000 mg | ORAL_TABLET | Freq: Once | ORAL | Status: AC
  Administered 2012-09-14: 1000 mg via ORAL

## 2012-09-14 NOTE — ED Notes (Signed)
Pt reports woke up with with cough, generalized aches, and fever today.  Reports took tylenol aroun 1230 today.

## 2012-09-14 NOTE — ED Provider Notes (Signed)
History  This chart was scribed for Dione Booze, MD by Shari Heritage and Lacey Jensen, ED Scribe. The patient was seen in room [facility]. Patient's care was started at 1857.   CSN: [id number]  Arrival date & time 09/14/12  1658   First MD Initiated Contact with Patient 09/14/12 1857      Chief Complaint  Patient presents with  . Cough  . Fever     The history is provided by the patient. No language interpreter was used.    Craig Castillo is a 79 y.o. male with history of ASCVD, diabetes, hypertension, MI, chronic kidney disease, bladder cancer who presents to the Emergency Department complaining of intermittent cough productive of white sputum and fever onset this morning upon waking. Patient states that his temperature at home was 101.9. Temperature at triage was 101.6. There is associated generalized body aches. Pain is currently 6/10.  Patient denies chills, SOB or any other symptoms at this time. Patient took Tylenol at 12:30 pm today with some relief from pain, but no relief from fever.to help with pain with some relief. Patient is a former smoker. He does not use alcohol.  PCP- Sherwood Gambler   Past Medical History  Diagnosis Date  . ASCVD (arteriosclerotic cardiovascular disease)   . Dyspnea   . Dyslipidemia   . DJD (degenerative joint disease)   . Hypertension   . Diabetes mellitus   . Myocardial infarction 1997  . BPH (benign prostatic hyperplasia)   . CKD (chronic kidney disease) stage 3, GFR 30-59 ml/min   . Coronary artery disease   . Bladder cancer     Past Surgical History  Procedure Laterality Date  . Cholecystectomy    . Esophagogastroduodenoscopy  06/08/2002    Soft stricture of the GE  junction with erosive esophagitis. The stricture was dilated to 10 [place] using a Maloney dilator. Swollen fold at GE junction on the gastric site which was biopsied for histology and was suspicious for sentinel folds.  . Colonoscopy  06/08/2002    Small polyp was oblated via cold  biopsy from the cecum and two were snared, one was at the rectosigmoid junction measuring about a centimeter, another one at the rectum which was smaller.  . Transurethral resection of bladder tumor      x7  . Coronary angioplasty  1997    3 stents  . Transurethral resection of bladder tumor  03/15/2012    Procedure: TRANSURETHRAL RESECTION OF BLADDER TUMOR (TURBT);  Surgeon: Ky Barban, MD;  Location: [facility];  Service: Urology;  Laterality: N/A;  . Cataract extraction w/phaco  06/20/2012    Procedure: CATARACT EXTRACTION PHACO AND INTRAOCULAR LENS PLACEMENT (IOC);  Surgeon: Gemma Payor, MD;  Location: [facility];  Service: Ophthalmology;  Laterality: Right;  CDE=16.59  . Cataract extraction w/phaco Left 07/04/2012    Procedure: CATARACT EXTRACTION PHACO AND INTRAOCULAR LENS PLACEMENT (IOC);  Surgeon: Gemma Payor, MD;  Location: [facility];  Service: Ophthalmology;  Laterality: Left;  CDE: 24.33    Family History  Problem Relation Age of Onset  . Stroke Mother 23  . Diabetes Brother 27    History  Substance Use Topics  . Smoking status: Former Smoker -- 0.50 packs/day for 20 years    Types: Cigarettes    Quit date: 03/08/1981  . Smokeless tobacco: Not on file  . Alcohol Use: No      Review of Systems  Constitutional: Positive for fever. Negative for chills.  Respiratory: Positive for cough. Negative for  shortness of breath.   Gastrointestinal: Negative for nausea, vomiting and diarrhea.  Musculoskeletal: Positive for myalgias.  All other systems reviewed and are negative.    Allergies  Celecoxib; Hydrocodone-acetaminophen; Morphine; Niacin; and Piroxicam  Home Medications   Current Outpatient Rx  Name  Route  Sig  Dispense  Refill  . amLODipine (NORVASC) 10 MG tablet   Oral   Take 10 mg by mouth daily.           Marland Kitchen gemfibrozil (LOPID) 600 MG tablet   Oral   Take 600 mg by mouth 2 (two) times daily.          . insulin NPH (HUMULIN N,NOVOLIN N) 100 UNIT/ML  injection   Subcutaneous   Inject 20 Units into the skin 2 (two) times daily.           . metoprolol (LOPRESSOR) 50 MG tablet   Oral   Take 50 mg by mouth 2 (two) times daily.           . nitroGLYCERIN (NITROSTAT) 0.4 MG SL tablet   Sublingual   Place 0.4 mg under the tongue as directed. Chest Pain         . omeprazole (PRILOSEC) 20 MG capsule   Oral   Take 20 mg by mouth daily.           . Tamsulosin HCl (FLOMAX) 0.4 MG CAPS   Oral   Take 0.4 mg by mouth daily.            Triage Vitals:BP 180/92  Pulse 107  Temp(Src) 101.6 F (38.7 C) (Oral)  Resp 18  Ht 5\' 8"  (1.727 m)  Wt 240 lb (108.863 kg)  BMI 36.5 kg/m2  SpO2 97%  Physical Exam  Constitutional: He is oriented to person, place, and time. He appears well-developed and well-nourished.  HENT:  Head: Normocephalic and atraumatic.  Eyes: Conjunctivae and EOM are normal. Pupils are equal, round, and reactive to light.  Neck: Normal range of motion. Neck supple.  Cardiovascular: Normal rate, regular rhythm and normal heart sounds.   Pulmonary/Chest: Effort normal and breath sounds normal.  Abdominal: Soft. Bowel sounds are normal.  Musculoskeletal: Normal range of motion.  Neurological: He is alert and oriented to person, place, and time.  Skin: Skin is warm and dry.  Psychiatric: He has a normal mood and affect. His behavior is normal.    ED Course  Procedures (including critical care time) DIAGNOSTIC STUDIES: Oxygen Saturation is 97% on room air, adequate by my interpretation.    COORDINATION OF CARE: 7:06 PM- Patient informed of current plan for treatment and evaluation and agrees with plan at this time.     Results for orders placed during the hospital encounter of 09/14/12  GLUCOSE, CAPILLARY      Result Value Range   Glucose-Capillary 154 (*) 70 - 99 mg/dL   Dg Chest 2 View  7/82/9562  *RADIOLOGY REPORT*  Clinical Data: Cough and fever.  CHEST - 2 VIEW  Comparison: 03/12/2012  Findings:  The heart size and pulmonary vascularity are normal and the lungs are clear.  No effusions.  No acute osseous abnormality.  IMPRESSION: Normal chest.   Original Report Authenticated By: Francene Boyers, M.D.       1. Fever   2. Cough       MDM  Fever cough and body aches consistent with viral syndrome. There is no influenza in the community at this time to suggest that this might be influenza. He  is in no distress whatsoever and chest x-ray shows no evidence of pneumonia. He will need to be treated symptomatically. This is explained to the patient and he is in agreement.    I personally performed the services described in this documentation, which was scribed in my presence. The recorded information has been reviewed and is accurate.     Dione Booze, MD 09/15/12 1426

## 2012-09-16 ENCOUNTER — Encounter (HOSPITAL_COMMUNITY): Payer: Self-pay | Admitting: Pharmacy Technician

## 2012-09-22 NOTE — Patient Instructions (Addendum)
Craig Castillo  09/22/2012   Your procedure is scheduled on:   09/29/2012  Report to [facility] at  615  AM.  Call this number if you have problems the morning of surgery: [phone]   Remember:   Do not eat food or drink liquids after midnight.   Take these medicines the morning of surgery with A SIP OF WATER: norvasc,metoprolol,prilosec,flomax. Take 1/2 Insulin night before your surgery.   Do not wear jewelry, make-up or nail polish.  Do not wear lotions, powders, or perfumes.   Do not shave 48 hours prior to surgery. Men may shave face and neck.  Do not bring valuables to the hospital.  Contacts, dentures or bridgework may not be worn into surgery.  Leave suitcase in the car. After surgery it may be brought to your room.  For patients admitted to the hospital, checkout time is 11:00 AM the day of discharge.   Patients discharged the day of surgery will not be allowed to drive  home.  Name and phone number of your driver: family  Special Instructions: Shower using CHG 2 nights before surgery and the night before surgery.  If you shower the day of surgery use CHG.  Use special wash - you have one bottle of CHG for all showers.  You should use approximately 1/3 of the bottle for each shower.   Please read over the following fact sheets that you were given: Pain Booklet, Coughing and Deep Breathing, MRSA Information, Surgical Site Infection Prevention, Anesthesia Post-op Instructions and Care and Recovery After Surgery Transurethral Resection, Bladder Tumor A cancerous growth (tumor) can develop on the inside wall of the bladder. The bladder is the organ that holds urine. One way to remove the tumor is a procedure called a transurethral resection. The tumor is removed (resected) through the tube that carries urine from the bladder out of the body (urethra). No cuts (incisions) are made in the skin. Instead, the procedure is done through a thin telescope, called a resectoscope. Attached  to it is a light and usually a tiny camera. The resectoscope is put into the urethra. In men, the urethra opens at the end of the penis. In women, it opens just above the vagina.  A transurethral resection is usually used to remove tumors that have not gotten too big or too deep. These are called Stage 0, Stage 1 or Stage 2 bladder cancers. LET YOUR CAREGIVER KNOW ABOUT:  On the day of the procedure, your caregivers will need to know the last time you had anything to eat or drink. This includes water, gum, and candy. In advance, make sure they know about:   Any allergies.  All medications you are taking, including:  Herbs, eyedrops, over-the-counter medications and creams.  Blood thinners (anticoagulants), aspirin or other drugs that could affect blood clotting.  Use of steroids (by mouth or as creams).  Previous problems with anesthetics, including local anesthetics.  Possibility of pregnancy, if this applies.  Any history of blood clots.  Any history of bleeding or other blood problems.  Previous surgery.  Smoking history.  Any recent symptoms of colds or infections.  Other health problems. RISKS AND COMPLICATIONS This is usually a safe procedure. Every procedure has risks, though. For a transurethral resection, they include:  Infection. Antibiotic medication would need to be taken.  Bleeding.  Light bleeding may last for several days after the procedure.  If bleeding continues or is heavy, the bladder may need  rinsing. Or, a new catheter might be put in for awhile.  Sometimes bed rest is needed.  Urination problems.  Pain and burning can occur when urinating. This usually goes away in a few days.  Scarring from the procedure can block the flow of urine.  Bladder damage.  It can be punctured or torn during removal of the tumor. If this happens, a catheter might be needed for longer. Antibiotics would be taken while the bladder heals.  Urine can leak through the  hole or tear into the abdomen. If this happens, surgery may be needed to repair the bladder. BEFORE THE PROCEDURE   A medical evaluation will be done. This may include:  A physical examination.  Urine test. This is to make sure you do not have a urinary tract infection.  Blood tests.  A test that checks the heart's rhythm (electrocardiogram).  Talking with an anesthesiologist. This is the person who will be in charge of the medication (anesthesia) to keep you from feeling pain during the transurethral resection. You might be asleep during the procedure (general anesthesia) or numb from the waist down, but awake during the procedure (spinal anesthesia). Ask your surgeon what to expect.  The person who is having a transurethral resection needs to give what is called informed consent. This requires signing a legal paper that gives permission for the procedure. To give informed consent:  You must understand how the procedure is done and why.  You must be told all the risks and benefits of the procedure.  You must sign the consent. Sometimes a legal guardian can do this.  Signing should be witnessed by a healthcare professional.  The day before the surgery, eat only a light dinner. Then, do not eat or drink anything for at least 8 hours before the surgery. Ask your caregiver if it is OK to take any needed medicines with a sip of water.  Arrive at least an hour before the surgery or whenever your surgeon recommends. This will give you time to check in and fill out any needed paperwork. PROCEDURE  The preparation:  You will change into a hospital gown.  A needle will be inserted in your arm. This is an intravenous access tube (IV). Medication will be able to flow directly into your body through this needle.  Small monitors will be put on your body. They are used to check your heart, blood pressure, and oxygen level.  You might be given medication that will help you relax  (sedative).  You will be given a general anesthetic or spinal anesthesia.  The procedure:  Once you are asleep or numb from the waist down, your legs will be placed in stirrups.  The resectoscope will be passed through the urethra into the bladder.  Fluid will be passed through the resectoscope. This will fill the bladder with water.  The surgeon will examine the bladder through the scope. If the scope has a camera, it can take pictures from inside the bladder. They can be projected onto a TV screen.  The surgeon will use various tools to remove the tumor in small pieces. Sometimes a laser (a beam of light energy) is used. Other tools may use electric current.  A tube (catheter) will often be placed so that urine can drain into a bag outside the body. This process helps stop bleeding. This tube keeps blood clots from blocking the urethra.  The procedure usually takes 30 to 45 minutes. AFTER THE PROCEDURE   You  will stay in a recovery area until the anesthesia has worn off. Your blood pressure and pulse will be checked every so often. Then you will be taken to a hospital room.  You may continue to get fluids through the IV for awhile.  Some pain is normal. The catheter might be uncomfortable. Pain is usually not severe. If it is, ask for pain medicine.  Your urine may look bloody after a transurethral resection. This is normal.  If bleeding is heavy, a hospital caregiver may rinse out the bladder (irrigation) through the catheter.  Once the urine is clear, the catheter will be taken out.  You will need to stay in the hospital until you can urinate on your own.  Most people stay in the hospital for up to 4 days. PROGNOSIS   Transurethral resection is considered the best way to treat bladder tumors that are not too far along. For most people, the treatment is successful. Sometimes, though, more treatment is needed.  Bladder cancers can come back even after a successful procedure.  Because of this, be sure to have a checkup with your caregiver every 3 to 6 months. If everything is OK for 3 years, you can reduce the checkups to once a year. Document Released: 03/07/2009 Document Revised: 08/03/2011 Document Reviewed: 03/07/2009 [facility] Patient Information 2013 [place], [place]. PATIENT INSTRUCTIONS POST-ANESTHESIA  IMMEDIATELY FOLLOWING SURGERY:  Do not drive or operate machinery for the first twenty four hours after surgery.  Do not make any important decisions for twenty four hours after surgery or while taking narcotic pain medications or sedatives.  If you develop intractable nausea and vomiting or a severe headache please notify your doctor immediately.  FOLLOW-UP:  Please make an appointment with your surgeon as instructed. You do not need to follow up with anesthesia unless specifically instructed to do so.  WOUND CARE INSTRUCTIONS (if applicable):  Keep a dry clean dressing on the anesthesia/puncture wound site if there is drainage.  Once the wound has quit draining you may leave it open to air.  Generally you should leave the bandage intact for twenty four hours unless there is drainage.  If the epidural site drains for more than 36-48 hours please call the anesthesia department.  QUESTIONS?:  Please feel free to call your physician or the hospital operator if you have any questions, and they will be happy to assist you.

## 2012-09-23 ENCOUNTER — Encounter (HOSPITAL_COMMUNITY): Payer: Self-pay

## 2012-09-23 ENCOUNTER — Encounter (HOSPITAL_COMMUNITY)
Admission: RE | Admit: 2012-09-23 | Discharge: 2012-09-23 | Disposition: A | Discharge status: Home / Self Care | Payer: PRIVATE HEALTH INSURANCE | Source: Ambulatory Visit | Attending: Urology | Admitting: Urology

## 2012-09-23 LAB — BASIC METABOLIC PANEL
BUN: 27 mg/dL — ABNORMAL HIGH (ref 6–23)
Calcium: 9.4 mg/dL (ref 8.4–10.5)
Creatinine, Ser: 2.13 mg/dL — ABNORMAL HIGH (ref 0.50–1.35)
GFR calc Af Amer: 33 mL/min — ABNORMAL LOW (ref >90)

## 2012-09-23 LAB — SURGICAL PCR SCREEN: MRSA, PCR: NEGATIVE

## 2012-09-23 LAB — HEMOGLOBIN AND HEMATOCRIT, BLOOD
HCT: 43.8 % (ref 39.0–52.0)
Hemoglobin: 14.9 g/dL (ref 13.0–17.0)

## 2012-09-23 NOTE — Progress Notes (Signed)
09/23/12 1102  OBSTRUCTIVE SLEEP APNEA  Have you ever been diagnosed with sleep apnea through a sleep study? No  Do you snore loudly (loud enough to be heard through closed doors)?  0  Do you often feel tired, fatigued, or sleepy during the daytime? 0  Has anyone observed you stop breathing during your sleep? 0  Do you have, or are you being treated for high blood pressure? 1  BMI more than 35 kg/m2? 1  Age over 77 years old? 1  Neck circumference greater than 40 cm/18 inches? 0  Gender: 1  Obstructive Sleep Apnea Score 4  Score 4 or greater  Results sent to PCP

## 2012-09-29 ENCOUNTER — Encounter (HOSPITAL_COMMUNITY): Payer: Self-pay | Admitting: *Deleted

## 2012-09-29 ENCOUNTER — Encounter (HOSPITAL_COMMUNITY)
Admission: RE | Disposition: A | Discharge status: Home / Self Care | Payer: Self-pay | Source: Ambulatory Visit | Attending: Urology

## 2012-09-29 ENCOUNTER — Ambulatory Visit (HOSPITAL_COMMUNITY): Payer: PRIVATE HEALTH INSURANCE | Admitting: Anesthesiology

## 2012-09-29 ENCOUNTER — Encounter (HOSPITAL_COMMUNITY): Payer: Self-pay | Admitting: Anesthesiology

## 2012-09-29 ENCOUNTER — Observation Stay (HOSPITAL_COMMUNITY)
Admission: RE | Admit: 2012-09-29 | Discharge: 2012-09-30 | Disposition: A | Discharge status: Home / Self Care | Payer: PRIVATE HEALTH INSURANCE | Source: Ambulatory Visit | Attending: Urology | Admitting: Urology

## 2012-09-29 DIAGNOSIS — C679 Malignant neoplasm of bladder, unspecified: Principal | ICD-10-CM | POA: Insufficient documentation

## 2012-09-29 DIAGNOSIS — I2510 Atherosclerotic heart disease of native coronary artery without angina pectoris: Secondary | ICD-10-CM

## 2012-09-29 DIAGNOSIS — E785 Hyperlipidemia, unspecified: Secondary | ICD-10-CM

## 2012-09-29 DIAGNOSIS — N184 Chronic kidney disease, stage 4 (severe): Secondary | ICD-10-CM

## 2012-09-29 DIAGNOSIS — Z01812 Encounter for preprocedural laboratory examination: Secondary | ICD-10-CM | POA: Insufficient documentation

## 2012-09-29 DIAGNOSIS — R0602 Shortness of breath: Secondary | ICD-10-CM

## 2012-09-29 DIAGNOSIS — I10 Essential (primary) hypertension: Secondary | ICD-10-CM | POA: Insufficient documentation

## 2012-09-29 DIAGNOSIS — R6889 Other general symptoms and signs: Secondary | ICD-10-CM

## 2012-09-29 DIAGNOSIS — Z87442 Personal history of urinary calculi: Secondary | ICD-10-CM

## 2012-09-29 DIAGNOSIS — R509 Fever, unspecified: Secondary | ICD-10-CM

## 2012-09-29 DIAGNOSIS — E1129 Type 2 diabetes mellitus with other diabetic kidney complication: Secondary | ICD-10-CM

## 2012-09-29 DIAGNOSIS — E119 Type 2 diabetes mellitus without complications: Secondary | ICD-10-CM | POA: Insufficient documentation

## 2012-09-29 DIAGNOSIS — L03115 Cellulitis of right lower limb: Secondary | ICD-10-CM

## 2012-09-29 DIAGNOSIS — M1990 Unspecified osteoarthritis, unspecified site: Secondary | ICD-10-CM

## 2012-09-29 DIAGNOSIS — E669 Obesity, unspecified: Secondary | ICD-10-CM

## 2012-09-29 HISTORY — PX: TRANSURETHRAL RESECTION OF BLADDER TUMOR: SHX2575

## 2012-09-29 LAB — GLUCOSE, CAPILLARY: Glucose-Capillary: 126 mg/dL — ABNORMAL HIGH (ref 70–99)

## 2012-09-29 SURGERY — TURBT (TRANSURETHRAL RESECTION OF BLADDER TUMOR)
Anesthesia: Spinal | Site: Bladder | Wound class: Clean Contaminated

## 2012-09-29 MED ORDER — LACTATED RINGERS IV SOLN
INTRAVENOUS | Status: DC
  Administered 2012-09-29: 07:00:00 via INTRAVENOUS

## 2012-09-29 MED ORDER — INSULIN NPH (HUMAN) (ISOPHANE) 100 UNIT/ML ~~LOC~~ SUSP
20.0000 [IU] | Freq: Two times a day (BID) | SUBCUTANEOUS | Status: DC
  Administered 2012-09-29 – 2012-09-30 (×3): 20 [IU] via SUBCUTANEOUS
  Filled 2012-09-29 (×2): qty 10

## 2012-09-29 MED ORDER — FENTANYL CITRATE 0.05 MG/ML IJ SOLN
INTRAMUSCULAR | Status: DC | PRN
  Administered 2012-09-29: 12.5 ug via INTRATHECAL

## 2012-09-29 MED ORDER — TAMSULOSIN HCL 0.4 MG PO CAPS
0.4000 mg | ORAL_CAPSULE | Freq: Every day | ORAL | Status: DC
  Administered 2012-09-29 – 2012-09-30 (×2): 0.4 mg via ORAL
  Filled 2012-09-29 (×2): qty 1

## 2012-09-29 MED ORDER — STERILE WATER FOR IRRIGATION IR SOLN
Status: DC | PRN
  Administered 2012-09-29: 1000 mL

## 2012-09-29 MED ORDER — PROPOFOL INFUSION 10 MG/ML OPTIME
INTRAVENOUS | Status: DC | PRN
  Administered 2012-09-29: 100 ug/kg/min via INTRAVENOUS

## 2012-09-29 MED ORDER — BUPIVACAINE IN DEXTROSE 0.75-8.25 % IT SOLN
INTRATHECAL | Status: AC
  Filled 2012-09-29: qty 2

## 2012-09-29 MED ORDER — MIDAZOLAM HCL 2 MG/2ML IJ SOLN
1.0000 mg | INTRAMUSCULAR | Status: DC | PRN
  Administered 2012-09-29: 2 mg via INTRAVENOUS

## 2012-09-29 MED ORDER — ONDANSETRON HCL 4 MG/2ML IJ SOLN: 4.0000 mg | Freq: Once | INTRAMUSCULAR | Status: DC | PRN

## 2012-09-29 MED ORDER — GEMFIBROZIL 600 MG PO TABS
600.0000 mg | ORAL_TABLET | Freq: Two times a day (BID) | ORAL | Status: DC
  Administered 2012-09-29 – 2012-09-30 (×3): 600 mg via ORAL
  Filled 2012-09-29 (×3): qty 1

## 2012-09-29 MED ORDER — PROPOFOL 10 MG/ML IV BOLUS
INTRAVENOUS | Status: DC | PRN
  Administered 2012-09-29: 10 mg via INTRAVENOUS

## 2012-09-29 MED ORDER — FENTANYL CITRATE 0.05 MG/ML IJ SOLN
25.0000 ug | INTRAMUSCULAR | Status: DC | PRN
  Administered 2012-09-29: 25 ug via INTRAVENOUS

## 2012-09-29 MED ORDER — PROPOFOL 10 MG/ML IV EMUL
INTRAVENOUS | Status: AC
  Filled 2012-09-29: qty 20

## 2012-09-29 MED ORDER — MITOMYCIN CHEMO FOR BLADDER INSTILLATION 40 MG
INTRAVENOUS | Status: DC | PRN
  Administered 2012-09-29: 60 mg via INTRAVESICAL

## 2012-09-29 MED ORDER — FENTANYL CITRATE 0.05 MG/ML IJ SOLN
INTRAMUSCULAR | Status: DC | PRN
  Administered 2012-09-29: 12.5 ug via INTRAVENOUS
  Administered 2012-09-29 (×2): 25 ug via INTRAVENOUS

## 2012-09-29 MED ORDER — LIDOCAINE HCL (CARDIAC) 10 MG/ML IV SOLN
INTRAVENOUS | Status: DC | PRN
  Administered 2012-09-29: 20 mg via INTRAVENOUS

## 2012-09-29 MED ORDER — CIPROFLOXACIN IN D5W 400 MG/200ML IV SOLN
200.0000 mg | Freq: Once | INTRAVENOUS | Status: AC
  Administered 2012-09-29: 200 mg via INTRAVENOUS

## 2012-09-29 MED ORDER — FENTANYL CITRATE 0.05 MG/ML IJ SOLN: 25.0000 ug | INTRAMUSCULAR | Status: DC | PRN

## 2012-09-29 MED ORDER — METOPROLOL TARTRATE 50 MG PO TABS
50.0000 mg | ORAL_TABLET | Freq: Two times a day (BID) | ORAL | Status: DC
  Administered 2012-09-29 – 2012-09-30 (×3): 50 mg via ORAL
  Filled 2012-09-29 (×3): qty 1

## 2012-09-29 MED ORDER — GLYCINE 1.5 % IR SOLN
Status: DC | PRN
  Administered 2012-09-29 (×7): 3000 mL

## 2012-09-29 MED ORDER — PANTOPRAZOLE SODIUM 40 MG PO TBEC
40.0000 mg | DELAYED_RELEASE_TABLET | Freq: Every day | ORAL | Status: DC
  Administered 2012-09-29 – 2012-09-30 (×2): 40 mg via ORAL
  Filled 2012-09-29 (×2): qty 1

## 2012-09-29 MED ORDER — LIDOCAINE HCL (PF) 1 % IJ SOLN
INTRAMUSCULAR | Status: AC
  Filled 2012-09-29: qty 5

## 2012-09-29 MED ORDER — FENTANYL CITRATE 0.05 MG/ML IJ SOLN
INTRAMUSCULAR | Status: AC
  Filled 2012-09-29: qty 2

## 2012-09-29 MED ORDER — AMLODIPINE BESYLATE 5 MG PO TABS
10.0000 mg | ORAL_TABLET | Freq: Every day | ORAL | Status: DC
  Administered 2012-09-29 – 2012-09-30 (×2): 10 mg via ORAL
  Filled 2012-09-29 (×2): qty 2

## 2012-09-29 MED ORDER — MIDAZOLAM HCL 2 MG/2ML IJ SOLN
INTRAMUSCULAR | Status: AC
  Filled 2012-09-29: qty 2

## 2012-09-29 MED ORDER — NITROGLYCERIN 0.4 MG SL SUBL: 0.4000 mg | SUBLINGUAL_TABLET | SUBLINGUAL | Status: DC

## 2012-09-29 MED ORDER — CIPROFLOXACIN IN D5W 200 MG/100ML IV SOLN
INTRAVENOUS | Status: AC
  Filled 2012-09-29: qty 100

## 2012-09-29 MED ORDER — BUPIVACAINE IN DEXTROSE 0.75-8.25 % IT SOLN
INTRATHECAL | Status: DC | PRN
  Administered 2012-09-29: 15 mg via INTRATHECAL

## 2012-09-29 MED ORDER — MITOMYCIN CHEMO FOR BLADDER INSTILLATION 40 MG
60.0000 mg | Freq: Once | INTRAVENOUS | Status: DC
  Filled 2012-09-29: qty 60

## 2012-09-29 SURGICAL SUPPLY — 32 items
3000 ML GLYCINE IMPLANT
BAG DECANTER FOR FLEXI CONT (MISCELLANEOUS) ×2 IMPLANT
BAG DRAIN URO TABLE W/ADPT NS (DRAPE) ×2 IMPLANT
BAG DRN 8 ADPR NS SKTRN CSTL (DRAPE) ×1
BAG URINE DRAINAGE (UROLOGICAL SUPPLIES) ×2 IMPLANT
CABLE HI FREQUENCY MONOPOLAR (ELECTROSURGICAL) ×2 IMPLANT
CATH FOLEY 2WAY SLVR  5CC 20FR (CATHETERS) ×2
CATH FOLEY 2WAY SLVR 5CC 20FR (CATHETERS) ×1 IMPLANT
CLOTH BEACON ORANGE TIMEOUT ST (SAFETY) ×2 IMPLANT
CONNECTOR 5 IN 1 STRAIGHT STRL (MISCELLANEOUS) ×2 IMPLANT
DRAPE WARM FLUID 44X44 (DRAPE) ×2 IMPLANT
ELECT CUT LOOP C-MAX 27FR .012 (CUTTING LOOP) ×2
ELECTRODE BALL 24/28FR 5MM (UROLOGICAL SUPPLIES) ×2 IMPLANT
ELECTRODE CUT LP CMX 27FR .012 (CUTTING LOOP) ×1 IMPLANT
FORMALIN 10 PREFIL 120ML (MISCELLANEOUS) ×1 IMPLANT
GLOVE BIO SURGEON STRL SZ7 (GLOVE) ×2 IMPLANT
GLOVE BIOGEL PI IND STRL 7.0 (GLOVE) IMPLANT
GLOVE BIOGEL PI IND STRL 8 (GLOVE) IMPLANT
GLOVE BIOGEL PI INDICATOR 7.0 (GLOVE) ×2
GLOVE BIOGEL PI INDICATOR 8 (GLOVE) ×1
GLOVE ECLIPSE 6.5 STRL STRAW (GLOVE) ×1 IMPLANT
GLYCINE 1.5% IRRIG UROMATIC (IV SOLUTION) ×10 IMPLANT
GOWN STRL REIN XL XLG (GOWN DISPOSABLE) ×2 IMPLANT
IV NS IRRIG 3000ML ARTHROMATIC (IV SOLUTION) ×2 IMPLANT
KIT ROOM TURNOVER AP CYSTO (KITS) ×2 IMPLANT
MANIFOLD NEPTUNE II (INSTRUMENTS) ×2 IMPLANT
PACK CYSTO (CUSTOM PROCEDURE TRAY) ×2 IMPLANT
PAD ARMBOARD 7.5X6 YLW CONV (MISCELLANEOUS) ×2 IMPLANT
SET IRRIGATING DISP (SET/KITS/TRAYS/PACK) ×2 IMPLANT
SYR 30ML LL (SYRINGE) ×2 IMPLANT
TOWEL OR 17X26 4PK STRL BLUE (TOWEL DISPOSABLE) ×2 IMPLANT
YANKAUER SUCT BULB TIP 10FT TU (MISCELLANEOUS) ×2 IMPLANT

## 2012-09-29 NOTE — Brief Op Note (Signed)
09/29/2012  9:23 AM  PATIENT:  Julian Hy  79 y.o. male  PRE-OPERATIVE DIAGNOSIS:  recurrent bladder tumor  POST-OPERATIVE DIAGNOSIS:  recurrent bladder tumor  PROCEDURE:  Procedure(s): TRANSURETHRAL RESECTION OF BLADDER TUMOR (TURBT) (N/A)  SURGEON:  Surgeon(s) and Role:    * Ky Barban, MD - Primary  PHYSICIAN ASSISTANT:   ASSISTANTS: none   ANESTHESIA:   spinal  EBL:  Total I/O In: 500 [I.V.:500] Out: 0   BLOOD ADMINISTERED:none  DRAINS: Urinary Catheter (Foley)   LOCAL MEDICATIONS USED:  NONE  SPECIMEN:  Source of Specimen:  bladder tumor  DISPOSITION OF SPECIMEN:  PATHOLOGY  COUNTS:  YES  TOURNIQUET:  * No tourniquets in log *  DICTATION: .Other Dictation: Dictation Number dictation [id number]  PLAN OF CARE: Admit for overnight observation  PATIENT DISPOSITION:  PACU - hemodynamically stable.   Delay start of Pharmacological VTE agent (>24hrs) due to surgical blood loss or risk of bleeding:

## 2012-09-29 NOTE — Progress Notes (Signed)
Removed plug and connected the foley drainage bag. No clots noted. Blue discoloration to urine.

## 2012-09-29 NOTE — Transfer of Care (Addendum)
Immediate Anesthesia Transfer of Care Note  Patient: Craig Castillo  Procedure(s) Performed: Procedure(s) (LRB): TRANSURETHRAL RESECTION OF BLADDER TUMOR (TURBT) (N/A)  Patient Location: PACU  Anesthesia Type: SAB  Level of Consciousness: awake  Airway & Oxygen Therapy: Patient Spontanous Breathing and non-rebreather face mask  Post-op Assessment: Report given to PACU RN, Post -op Vital signs reviewed and stable. SAB Level  T 10  Post vital signs: Reviewed and stable  Complications: No apparent anesthesia complications

## 2012-09-29 NOTE — H&P (Signed)
NAME:  Craig Castillo, Craig Castillo NO.:  [id number]  MEDICAL RECORD NO.:  [id number]  LOCATION:                                 FACILITY:  PHYSICIAN:  Ky Barban, M.D.DATE OF BIRTH:  [birth year]  DATE OF ADMISSION:  09/29/2012 DATE OF DISCHARGE:  LH                             HISTORY & PHYSICAL   CHIEF COMPLAINT:  Recurrent bladder tumor.  HISTORY OF PRESENT ILLNESS:  A 79 year old gentleman, whom I am following for several years has history of having bladder tumor, which is superficial and for long time there was no recurrence, but all of a sudden he started to have recurrences.  His recent recurrence was in October last year, which was resected.  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  Recently, he had another cystoscopy done and found to have several other foci of superficial tumor recurrence.  So, he is being brought as outpatient to undergo TUR bladder tumor, will stay overnight in the hospital, and one other thing I have noticed that his creatinine has gone up little bit and he has developed chronic renal failure.  It could be because he has insulin- dependent diabetes, and history of multiple renal calculi.  There does not seem to be any obstruction now and I think once I am done with this TUR bladder tumor.  Then, I will do another renal ultrasound to make sure that he does have any obstruction.  He is coming as outpatient will undergo TUR bladder tumor in the hospital and we will also treat him with vancomycin in the recovery room.  PAST MEDICAL HISTORY:  History of multiple superficial bladder tumor resections several years ago, and last one was last year in October 2013.  Also insulin-dependent diabetes, and coronary artery disease.  He takes nitroglycerin.  FAMILY HISTORY:  Negative.  PERSONAL HISTORY:  Does not smoke or drink.  REVIEW OF SYSTEMS:  No nausea, vomiting, fever, or chills.  PHYSICAL EXAMINATION:  GENERAL:   Moderately built male, not in acute distress. VITAL SIGNS:  Blood pressure 130/80, temperature is normal. CENTRAL NERVOUS SYSTEM:  No gross neurological deficit. HEAD, NECK, EYES, ENT:  Negative. CHEST:  Symmetrical.  Normal breath sounds. HEART:  Regular sinus rhythm.  No murmur. ABDOMEN:  Soft, flat.  Liver, spleen, kidneys not palpable.  No CVA tenderness. GU:  External genitalia is normal. RECTAL:  Prostate 2+ smooth and firm.  It should be mentioned that a couple of years ago, I have done TUR prostate for BPH.  He is not having any more symptoms. EXTREMITIES:  Normal.  IMPRESSION:  Recurrent bladder tumor.  PLAN:  TUR bladder tumor as outpatient and keep him overnight in the hospital.     Ky Barban, M.D.     MIJ/MEDQ  D:  09/28/2012  T:  09/29/2012  Job:  [id number]

## 2012-09-29 NOTE — Anesthesia Procedure Notes (Addendum)
Procedure Name: MAC Date/Time: 09/29/2012 7:41 AM Performed by: Franco Nones Pre-anesthesia Checklist: Patient identified, Emergency Drugs available, Suction available, Timeout performed and Patient being monitored Patient Re-evaluated:Patient Re-evaluated prior to inductionOxygen Delivery Method: Nasal Cannula    Spinal  Patient location during procedure: OR Start time: 09/29/2012 7:48 AM End time: 09/29/2012 7:54 AM Staffing CRNA/Resident: Minerva Areola S Preanesthetic Checklist Completed: patient identified, site marked, surgical consent, pre-op evaluation, timeout performed, IV checked, risks and benefits discussed and monitors and equipment checked Spinal Block Patient position: right lateral decubitus Prep: Betadine Patient monitoring: heart rate, cardiac monitor, continuous pulse ox and blood pressure Approach: right paramedian Location: L3-4 Injection technique: single-shot Needle Needle type: Spinocan  Needle gauge: 22 G Needle length: 9 cm Assessment Sensory level: T8 Additional Notes Betadine prep x 3 1% lidocaine skin wheal 1 cc Clear CSF pre and post injection   ATTEMPTS: 1 TRAY ID: [id number] TRAY EXPIRATION DATE: 2014-12  Procedure Name: MAC Date/Time: 09/29/2012 8:05 AM Performed by: Franco Nones Pre-anesthesia Checklist: Patient identified, Emergency Drugs available, Suction available, Timeout performed and Patient being monitored Patient Re-evaluated:Patient Re-evaluated prior to inductionOxygen Delivery Method: Non-rebreather mask

## 2012-09-29 NOTE — Progress Notes (Signed)
No change in H&P on reexamination. 

## 2012-09-29 NOTE — Anesthesia Postprocedure Evaluation (Signed)
Anesthesia Post Note  Patient: Craig Castillo  Procedure(s) Performed: Procedure(s) (LRB): TRANSURETHRAL RESECTION OF BLADDER TUMOR (TURBT) (N/A)  Anesthesia type: Spinal  Patient location: PACU  Post pain: Pain level controlled  Post assessment: Post-op Vital signs reviewed, Patient's Cardiovascular Status Stable, Respiratory Function Stable, Patent Airway, No signs of Nausea or vomiting and Pain level controlled  Last Vitals:  Filed Vitals:   09/29/12 0934  BP: 139/80  Pulse: 72  Temp: 36.9 C  Resp: 18    Post vital signs: Reviewed and stable  Level of consciousness: awake and alert   Complications: No apparent anesthesia complications

## 2012-09-29 NOTE — Progress Notes (Signed)
Basically been watching patient while waiting for room assignment.

## 2012-09-29 NOTE — Anesthesia Preprocedure Evaluation (Signed)
Anesthesia Evaluation  Patient identified by MRN, date of birth, ID band Patient awake    Reviewed: Allergy & Precautions, H&P , NPO status , Patient's Chart, lab work & pertinent test results, reviewed documented beta blocker date and time   History of Anesthesia Complications Negative for: history of anesthetic complications  Airway Mallampati: I TM Distance: >3 FB     Dental  (+) Teeth Intact and Partial Upper   Pulmonary shortness of breath and with exertion, former smoker,  breath sounds clear to auscultation        Cardiovascular hypertension, Pt. on medications and Pt. on home beta blockers - angina+ CAD, + Past MI and + Cardiac Stents Rhythm:Irregular Rate:Normal     Neuro/Psych negative neurological ROS  negative psych ROS   GI/Hepatic Neg liver ROS, GERD-  Medicated and Controlled,  Endo/Other  diabetes, Type 2, Insulin DependentMorbid obesity  Renal/GU Renal InsufficiencyRenal disease     Musculoskeletal  (+) Arthritis -, Osteoarthritis,    Abdominal (+) + obese,  Abdomen: soft.    Peds  Hematology   Anesthesia Other Findings   Reproductive/Obstetrics                           Anesthesia Physical Anesthesia Plan  ASA: III  Anesthesia Plan: Spinal   Post-op Pain Management:    Induction:   Airway Management Planned: Nasal Cannula  Additional Equipment:   Intra-op Plan:   Post-operative Plan:   Informed Consent: I have reviewed the patients History and Physical, chart, labs and discussed the procedure including the risks, benefits and alternatives for the proposed anesthesia with the patient or authorized representative who has indicated his/her understanding and acceptance.     Plan Discussed with:   Anesthesia Plan Comments:         Anesthesia Quick Evaluation

## 2012-09-30 ENCOUNTER — Observation Stay (HOSPITAL_COMMUNITY): Payer: PRIVATE HEALTH INSURANCE

## 2012-09-30 LAB — CBC
HCT: 39.2 % (ref 39.0–52.0)
Hemoglobin: 13.6 g/dL (ref 13.0–17.0)
MCH: 29.8 pg (ref 26.0–34.0)
MCV: 85.8 fL (ref 78.0–100.0)
Platelets: 254 10*3/uL (ref 150–400)
RBC: 4.57 MIL/uL (ref 4.22–5.81)
WBC: 9.8 10*3/uL (ref 4.0–10.5)

## 2012-09-30 LAB — BASIC METABOLIC PANEL
CO2: 22 mEq/L (ref 19–32)
Calcium: 9.1 mg/dL (ref 8.4–10.5)
Chloride: 102 mEq/L (ref 96–112)
Creatinine, Ser: 2.54 mg/dL — ABNORMAL HIGH (ref 0.50–1.35)
Glucose, Bld: 112 mg/dL — ABNORMAL HIGH (ref 70–99)

## 2012-09-30 LAB — GLUCOSE, CAPILLARY: Glucose-Capillary: 140 mg/dL — ABNORMAL HIGH (ref 70–99)

## 2012-09-30 NOTE — Op Note (Signed)
NAME:  [name], EVEN NO.:  [id number]  MEDICAL RECORD NO.:  [id number]  LOCATION:  A219                          FACILITY:  [facility]  PHYSICIAN:  Ky Barban, M.D.DATE OF BIRTH:  1932/10/31  DATE OF PROCEDURE: DATE OF DISCHARGE:                              OPERATIVE REPORT   PREOPERATIVE DIAGNOSIS:  Recurrent bladder tumor.  POSTOPERATIVE DIAGNOSIS:  Recurrent bladder tumor.  PROCEDURE:  TUR of bladder tumor, multifocal superficial.  PROCEDURE:  The patient was placed in lithotomy position under spinal anesthesia, after usual prep and drape.  A #28 Iglesias resectoscope was introduced into the bladder.  It was inspected.  There are superficial foci in the bladder on the left side, behind the bladder neck at 2 o'clock position, and it is very difficult to get to that position because of the location of the tumor and it was just behind the bladder neck, and also going over the bladder neck, but it appears to be superficial with the help of the resectoscope.  This part of the tumor was completely resected.  Some of the other foci which were located on the left bladder wall.  They were simply fulgurated.  At the end, the chips were evacuated.  I inspected the bladder thoroughly with camera and without camera and I did not see any other focus and all the chips were out.  Resectoscope was removed.  Then, I used a mitomycin to fill up the bladder just like according to our protocol.  The catheter #20 Foley catheter which was already there.  I had inserted was clamped. The patient left the operating room in satisfactory condition.     Ky Barban, M.D.     MIJ/MEDQ  D:  09/29/2012  T:  09/30/2012  Job:  [id number]

## 2012-09-30 NOTE — Anesthesia Postprocedure Evaluation (Signed)
Anesthesia Post Note  Patient: Craig Castillo  Procedure(s) Performed: Procedure(s) (LRB): TRANSURETHRAL RESECTION OF BLADDER TUMOR (TURBT) (N/A)  Anesthesia type: Spinal  Patient location: 219  Post pain: Pain level controlled  Post assessment: Post-op Vital signs reviewed, Patient's Cardiovascular Status Stable, Respiratory Function Stable, Patent Airway, No signs of Nausea or vomiting and Pain level controlled  Last Vitals:  Filed Vitals:   09/30/12 0426  BP: 151/72  Pulse: 80  Temp: 37 C  Resp: 19    Post vital signs: Reviewed and stable  Level of consciousness: awake and alert   Complications: No apparent anesthesia complications

## 2012-09-30 NOTE — Progress Notes (Signed)
Doing fine abebrile will dc home with catheter.may resume home meds.

## 2012-09-30 NOTE — Care Management Note (Signed)
    Page 1 of 1   09/30/2012     3:05:49 PM   CARE MANAGEMENT NOTE 09/30/2012  Patient:  Craig Castillo, Craig Castillo   Account Number:  [id number]  Date Initiated:  09/30/2012  Documentation initiated by:  Anibal Henderson  Subjective/Objective Assessment:   Admitted to [facility] following TUR of a bladder tumor. Lives at home with spouse, is independent, and will return  home at D/C     Action/Plan:   no needs identified   Anticipated DC Date:  09/30/2012   Anticipated DC Plan:  HOME/SELF CARE      DC Planning Services  CM consult      Choice offered to / List presented to:             Status of service:  Completed, signed off Medicare Important Message given?   (If response is "NO", the following Medicare IM given date fields will be blank) Date Medicare IM given:   Date Additional Medicare IM given:    Discharge Disposition:    Per UR Regulation:    If discussed at Long Length of Stay Meetings, dates discussed:    Comments:  09/30/12 1500 Anibal Henderson RN/CM

## 2012-09-30 NOTE — Progress Notes (Signed)
Patient discharged with instructions given on medications,and follow up visits,verbalized understanding.Discharged also with foley intact per Dr Vicenta Dunning given on foley care also.Family at bedside. No c/co pain or discomfort noted.Accompanied by staff to an awaiting vehicle.

## 2012-10-03 LAB — GLUCOSE, CAPILLARY: Glucose-Capillary: 145 mg/dL — ABNORMAL HIGH (ref 70–99)

## 2012-10-03 MED FILL — Glycine Irrigation Soln 1.5%: Qty: 3000 | Status: AC

## 2012-10-05 ENCOUNTER — Encounter (HOSPITAL_COMMUNITY): Payer: Self-pay | Admitting: Urology

## 2012-10-06 ENCOUNTER — Encounter (HOSPITAL_COMMUNITY): Payer: Self-pay | Admitting: Emergency Medicine

## 2012-10-06 ENCOUNTER — Emergency Department (HOSPITAL_COMMUNITY)
Admission: EM | Admit: 2012-10-06 | Discharge: 2012-10-06 | Disposition: A | Discharge status: Home / Self Care | Payer: PRIVATE HEALTH INSURANCE | Attending: Emergency Medicine | Admitting: Emergency Medicine

## 2012-10-06 DIAGNOSIS — R319 Hematuria, unspecified: Secondary | ICD-10-CM | POA: Insufficient documentation

## 2012-10-06 DIAGNOSIS — R0602 Shortness of breath: Secondary | ICD-10-CM | POA: Insufficient documentation

## 2012-10-06 DIAGNOSIS — Z8709 Personal history of other diseases of the respiratory system: Secondary | ICD-10-CM | POA: Insufficient documentation

## 2012-10-06 DIAGNOSIS — Z8739 Personal history of other diseases of the musculoskeletal system and connective tissue: Secondary | ICD-10-CM | POA: Insufficient documentation

## 2012-10-06 DIAGNOSIS — E785 Hyperlipidemia, unspecified: Secondary | ICD-10-CM | POA: Insufficient documentation

## 2012-10-06 DIAGNOSIS — Z794 Long term (current) use of insulin: Secondary | ICD-10-CM | POA: Insufficient documentation

## 2012-10-06 DIAGNOSIS — Z79899 Other long term (current) drug therapy: Secondary | ICD-10-CM | POA: Insufficient documentation

## 2012-10-06 DIAGNOSIS — I2510 Atherosclerotic heart disease of native coronary artery without angina pectoris: Secondary | ICD-10-CM | POA: Insufficient documentation

## 2012-10-06 DIAGNOSIS — M2550 Pain in unspecified joint: Secondary | ICD-10-CM | POA: Insufficient documentation

## 2012-10-06 DIAGNOSIS — Z9889 Other specified postprocedural states: Secondary | ICD-10-CM | POA: Insufficient documentation

## 2012-10-06 DIAGNOSIS — R32 Unspecified urinary incontinence: Secondary | ICD-10-CM | POA: Insufficient documentation

## 2012-10-06 DIAGNOSIS — Z9089 Acquired absence of other organs: Secondary | ICD-10-CM | POA: Insufficient documentation

## 2012-10-06 DIAGNOSIS — Z87891 Personal history of nicotine dependence: Secondary | ICD-10-CM | POA: Insufficient documentation

## 2012-10-06 DIAGNOSIS — E119 Type 2 diabetes mellitus without complications: Secondary | ICD-10-CM | POA: Insufficient documentation

## 2012-10-06 DIAGNOSIS — I129 Hypertensive chronic kidney disease with stage 1 through stage 4 chronic kidney disease, or unspecified chronic kidney disease: Secondary | ICD-10-CM | POA: Insufficient documentation

## 2012-10-06 DIAGNOSIS — R3919 Other difficulties with micturition: Secondary | ICD-10-CM | POA: Insufficient documentation

## 2012-10-06 DIAGNOSIS — R079 Chest pain, unspecified: Secondary | ICD-10-CM | POA: Insufficient documentation

## 2012-10-06 DIAGNOSIS — R000 Tachycardia, unspecified: Secondary | ICD-10-CM | POA: Insufficient documentation

## 2012-10-06 DIAGNOSIS — Z8551 Personal history of malignant neoplasm of bladder: Secondary | ICD-10-CM | POA: Insufficient documentation

## 2012-10-06 DIAGNOSIS — Z8679 Personal history of other diseases of the circulatory system: Secondary | ICD-10-CM | POA: Insufficient documentation

## 2012-10-06 DIAGNOSIS — M549 Dorsalgia, unspecified: Secondary | ICD-10-CM | POA: Insufficient documentation

## 2012-10-06 DIAGNOSIS — Z9861 Coronary angioplasty status: Secondary | ICD-10-CM | POA: Insufficient documentation

## 2012-10-06 DIAGNOSIS — I252 Old myocardial infarction: Secondary | ICD-10-CM | POA: Insufficient documentation

## 2012-10-06 DIAGNOSIS — N400 Enlarged prostate without lower urinary tract symptoms: Secondary | ICD-10-CM | POA: Insufficient documentation

## 2012-10-06 DIAGNOSIS — N183 Chronic kidney disease, stage 3 unspecified: Secondary | ICD-10-CM | POA: Insufficient documentation

## 2012-10-06 LAB — URINALYSIS, ROUTINE W REFLEX MICROSCOPIC
Ketones, ur: NEGATIVE mg/dL
Nitrite: NEGATIVE
Specific Gravity, Urine: 1.015 (ref 1.005–1.030)
Urobilinogen, UA: 0.2 mg/dL (ref 0.0–1.0)
pH: 6 (ref 5.0–8.0)

## 2012-10-06 LAB — CBC WITH DIFFERENTIAL/PLATELET
Eosinophils Absolute: 0.2 10*3/uL (ref 0.0–0.7)
Eosinophils Relative: 2 % (ref 0–5)
Hemoglobin: 12 g/dL — ABNORMAL LOW (ref 13.0–17.0)
Lymphs Abs: 0.5 10*3/uL — ABNORMAL LOW (ref 0.7–4.0)
MCH: 29.5 pg (ref 26.0–34.0)
MCV: 86.7 fL (ref 78.0–100.0)
Monocytes Relative: 2 % — ABNORMAL LOW (ref 3–12)
RBC: 4.07 MIL/uL — ABNORMAL LOW (ref 4.22–5.81)

## 2012-10-06 LAB — BASIC METABOLIC PANEL
BUN: 76 mg/dL — ABNORMAL HIGH (ref 6–23)
Calcium: 9.5 mg/dL (ref 8.4–10.5)
GFR calc non Af Amer: 8 mL/min — ABNORMAL LOW (ref >90)
Glucose, Bld: 113 mg/dL — ABNORMAL HIGH (ref 70–99)

## 2012-10-06 LAB — URINE MICROSCOPIC-ADD ON

## 2012-10-06 MED ORDER — LIDOCAINE HCL 2 % EX GEL
CUTANEOUS | Status: AC
  Filled 2012-10-06: qty 10

## 2012-10-06 MED ORDER — CIPROFLOXACIN HCL 500 MG PO TABS: 500.0000 mg | ORAL_TABLET | Freq: Two times a day (BID) | ORAL | Status: DC

## 2012-10-06 MED ORDER — LIDOCAINE HCL 2 % EX GEL
Freq: Once | CUTANEOUS | Status: AC
  Administered 2012-10-06: 10 via URETHRAL

## 2012-10-06 MED ORDER — CIPROFLOXACIN HCL 250 MG PO TABS
500.0000 mg | ORAL_TABLET | Freq: Once | ORAL | Status: AC
  Administered 2012-10-06: 500 mg via ORAL
  Filled 2012-10-06: qty 2

## 2012-10-06 NOTE — ED Notes (Signed)
Pt states that he has been "dripping" since his doctor, Dr. Jesse Fall, took out the pt's catheter on Monday following bladder surgery.

## 2012-10-06 NOTE — ED Provider Notes (Signed)
History     CSN: [id number]  Arrival date & time 10/06/12  7829   First MD Initiated Contact with Patient 10/06/12 [phone]      Chief Complaint  Patient presents with  . Urinary Incontinence    (Consider location/radiation/quality/duration/timing/severity/associated sxs/prior treatment) HPI Comments: Patient and spouse state that he recently had a tumor removed from the bladder for bladder cancer by Dr. Jesse Fall. The Foley catheter was removed on Monday may the 12th. The following day the patient started having" dribbling" and he can get it to stop. There's no blood in it. The patient has not had high fever. He's not had any new pain in the bladder. He states that this does not feel like bladder distention that he has had in the past. His been no blood in the stool and no pain with passing his stool. He's not had any recent fall or injury to the bladder area.  The history is provided by the patient and the spouse.    Past Medical History  Diagnosis Date  . ASCVD (arteriosclerotic cardiovascular disease)   . Dyspnea   . Dyslipidemia   . DJD (degenerative joint disease)   . Hypertension   . Diabetes mellitus   . Myocardial infarction 1997  . BPH (benign prostatic hyperplasia)   . CKD (chronic kidney disease) stage 3, GFR 30-59 ml/min   . Coronary artery disease   . Bladder cancer     Past Surgical History  Procedure Laterality Date  . Cholecystectomy    . Esophagogastroduodenoscopy  06/08/2002    Soft stricture of the GE  junction with erosive esophagitis. The stricture was dilated to 59 [place] using a Maloney dilator. Swollen fold at GE junction on the gastric site which was biopsied for histology and was suspicious for sentinel folds.  . Colonoscopy  06/08/2002    Small polyp was oblated via cold biopsy from the cecum and two were snared, one was at the rectosigmoid junction measuring about a centimeter, another one at the rectum which was smaller.  . Transurethral resection of  bladder tumor      x7  . Coronary angioplasty  1997    3 stents  . Transurethral resection of bladder tumor  03/15/2012    Procedure: TRANSURETHRAL RESECTION OF BLADDER TUMOR (TURBT);  Surgeon: Ky Barban, MD;  Location: [facility];  Service: Urology;  Laterality: N/A;  . Cataract extraction w/phaco  06/20/2012    Procedure: CATARACT EXTRACTION PHACO AND INTRAOCULAR LENS PLACEMENT (IOC);  Surgeon: Gemma Payor, MD;  Location: [facility];  Service: Ophthalmology;  Laterality: Right;  CDE=16.59  . Cataract extraction w/phaco Left 07/04/2012    Procedure: CATARACT EXTRACTION PHACO AND INTRAOCULAR LENS PLACEMENT (IOC);  Surgeon: Gemma Payor, MD;  Location: [facility];  Service: Ophthalmology;  Laterality: Left;  CDE: 24.33  . Transurethral resection of bladder tumor N/A 09/29/2012    Procedure: TRANSURETHRAL RESECTION OF BLADDER TUMOR (TURBT);  Surgeon: Ky Barban, MD;  Location: [facility];  Service: Urology;  Laterality: N/A;    Family History  Problem Relation Age of Onset  . Stroke Mother 56  . Diabetes Brother 72    History  Substance Use Topics  . Smoking status: Former Smoker -- 0.50 packs/day for 20 years    Types: Cigarettes    Quit date: 03/08/1981  . Smokeless tobacco: Never Used  . Alcohol Use: No      Review of Systems  Constitutional: Negative for activity change.       All  ROS Neg except as noted in HPI  HENT: Negative for nosebleeds and neck pain.   Eyes: Negative for photophobia and discharge.  Respiratory: Positive for shortness of breath. Negative for cough and wheezing.   Cardiovascular: Positive for chest pain. Negative for palpitations.  Gastrointestinal: Negative for abdominal pain and blood in stool.  Genitourinary: Positive for hematuria and difficulty urinating. Negative for dysuria and frequency.  Musculoskeletal: Positive for back pain and arthralgias.  Skin: Negative.   Neurological: Negative for dizziness, seizures and speech difficulty.   Psychiatric/Behavioral: Negative for hallucinations and confusion.    Allergies  Celecoxib; Hydrocodone-acetaminophen; Morphine; Niacin; and Piroxicam  Home Medications   Current Outpatient Rx  Name  Route  Sig  Dispense  Refill  . amLODipine (NORVASC) 10 MG tablet   Oral   Take 10 mg by mouth daily.           Marland Kitchen gemfibrozil (LOPID) 600 MG tablet   Oral   Take 600 mg by mouth 2 (two) times daily.          . insulin NPH (HUMULIN N,NOVOLIN N) 100 UNIT/ML injection   Subcutaneous   Inject 20 Units into the skin 2 (two) times daily.          . metoprolol (LOPRESSOR) 50 MG tablet   Oral   Take 50 mg by mouth 2 (two) times daily.           . nitroGLYCERIN (NITROSTAT) 0.4 MG SL tablet   Sublingual   Place 0.4 mg under the tongue as directed. Chest Pain         . omeprazole (PRILOSEC) 20 MG capsule   Oral   Take 20 mg by mouth daily.           . Tamsulosin HCl (FLOMAX) 0.4 MG CAPS   Oral   Take 0.4 mg by mouth daily.            BP 151/70  Pulse 107  Temp(Src) 98 F (36.7 C)  Resp 20  Ht 5\' 8"  (1.727 m)  Wt 240 lb (108.863 kg)  BMI 36.5 kg/m2  SpO2 96%  Physical Exam  Nursing note and vitals reviewed. Constitutional: He is oriented to person, place, and time. He appears well-developed and well-nourished.  Non-toxic appearance.  HENT:  Head: Normocephalic.  Right Ear: Tympanic membrane and external ear normal.  Left Ear: Tympanic membrane and external ear normal.  Eyes: EOM and lids are normal. Pupils are equal, round, and reactive to light.  Neck: Normal range of motion. Neck supple. Carotid bruit is not present.  Cardiovascular: Regular rhythm, normal heart sounds, intact distal pulses and normal pulses.  Tachycardia present.   Pulmonary/Chest: No respiratory distress. He has rhonchi.  Abdominal: Soft. Bowel sounds are normal. There is no tenderness. There is no guarding.  No pain to palpation of the abdomen. No bladder area distention. Mild  soreness over the bladder area.  Musculoskeletal: Normal range of motion.  Lymphadenopathy:       Head (right side): No submandibular adenopathy present.       Head (left side): No submandibular adenopathy present.    He has no cervical adenopathy.  Neurological: He is alert and oriented to person, place, and time. He has normal strength. No cranial nerve deficit or sensory deficit.  Skin: Skin is warm and dry.  Psychiatric: He has a normal mood and affect. His speech is normal.    ED Course  Procedures (including critical care time)  Labs Reviewed  URINALYSIS, ROUTINE W REFLEX MICROSCOPIC - Abnormal; Notable for the following:    APPearance CLOUDY (*)    Hgb urine dipstick LARGE (*)    Bilirubin Urine SMALL (*)    Protein, ur 100 (*)    Leukocytes, UA MODERATE (*)    All other components within normal limits  URINE MICROSCOPIC-ADD ON - Abnormal; Notable for the following:    Bacteria, UA MANY (*)    All other components within normal limits  URINE CULTURE  CBC WITH DIFFERENTIAL  BASIC METABOLIC PANEL   No results found.   No diagnosis found.    MDM  I have reviewed nursing notes, vital signs, and all appropriate lab and imaging results for this patient. Dr Rudean Haskell office called, but Dr Jerre Simon is out of town until May 28. The staff will arrange a  Follow up appointment. Bladder scan reveals only 7 mL left in the bladder. Urine analysis reveals too many to count white blood cells and too many to count red blood cells. The urine has a cloudy appearance with a large amount of hemoglobin present at 100 mcg of protein. Is also noted many bacteria and a moderate leukocyte esterase present.  A Foley catheter has been inserted to help the patient controlled the dribbling and also to prevent decubitus areas. Patient is placed on Cipro for the urinary tract infection. A culture has been sent to the lab. The urologist has been called and asked to set up an appointment for the patient  as sone as they return. Patient is to return to the emergency department if any changes, problems, or concerns.     Kathie Dike, PA-C 10/06/12 1301

## 2012-10-06 NOTE — ED Notes (Signed)
Bladder scan done, only 7 mL scanned in bladder

## 2012-10-06 NOTE — Discharge Instructions (Signed)
Please leave the Foley in place until you're seen by one of the urology specialist. Please use Cipro 2 times daily with food. Please return to the emergency department if any changes, problems, or concerns.

## 2012-10-08 LAB — URINE CULTURE

## 2012-10-08 NOTE — ED Provider Notes (Signed)
Medical screening examination/treatment/procedure(s) were performed by non-physician practitioner and as supervising physician I was immediately available for consultation/collaboration.   Laray Anger, DO 10/08/12 1237

## 2012-10-09 ENCOUNTER — Telehealth (HOSPITAL_COMMUNITY): Payer: Self-pay | Admitting: Emergency Medicine

## 2012-10-09 NOTE — ED Notes (Signed)
Post ED Visit - Positive Culture Follow-up  Culture report reviewed by antimicrobial stewardship pharmacist: []  Marlou Sa, Pharm.D., BCPS []  Celedonio Miyamoto, Pharm.D., BCPS []  Georgina Pillion, Pharm.D., BCPS []  [place], [address].D., BCPS, AAHIVP []  Estella Husk, Pharm.D., BCPS, AAHIV [x]  Laurence Slate, [address].D.  Positive urine culture Treated with Cipro, organism sensitive to the same and no further patient follow-up is required at this time.  Kylie A Holland 10/09/2012, 4:25 PM

## 2012-10-14 NOTE — Discharge Summary (Signed)
Discharge summary#[id number]

## 2012-10-15 NOTE — Discharge Summary (Signed)
NAME:  Craig Castillo, Craig Castillo NO.:  [id number]  MEDICAL RECORD NO.:  [id number]  LOCATION:  [facility]                         FACILITY:  [facility]  PHYSICIAN:  Ky Barban, M.D.DATE OF BIRTH:  [birth year]  DATE OF ADMISSION:  10/06/2012 DATE OF DISCHARGE:  10/06/2012                              DISCHARGE SUMMARY   Craig Castillo is a 79 year old gentleman who has multiple medical problems.  Initially several years ago, he presented with a large superficial bladder tumor which I resected, treated him with BCG and subsequently his biopsies were negative but recently they have become positive again with recurrent bladder tumor and last time few months ago, he underwent TUR bladder tumor.  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  Now, he has another recurrence, so he was brought as outpatient to undergo TURBT, which was done and he was admitted overnight for observation.  Today, his urine is clear.  He is out of bed.  He is eating his diabetic diet and he is being discharged home today with Foley catheter.  I will take the catheter out in the office.  FINAL DISCHARGE DIAGNOSES: 1. Pathology is still pending but looks like it is going to be     superficial transitional cell carcinoma. 2. Diabetes mellitus, insulin dependent. 3. Hypertension. 4. Renal failure.     Ky Barban, M.D.     MIJ/MEDQ  D:  10/14/2012  T:  10/14/2012  Job:  [id number]

## 2012-12-12 ENCOUNTER — Inpatient Hospital Stay (HOSPITAL_COMMUNITY): Payer: PRIVATE HEALTH INSURANCE

## 2012-12-12 ENCOUNTER — Emergency Department (HOSPITAL_COMMUNITY): Payer: PRIVATE HEALTH INSURANCE

## 2012-12-12 ENCOUNTER — Encounter (HOSPITAL_COMMUNITY): Payer: Self-pay | Admitting: *Deleted

## 2012-12-12 ENCOUNTER — Inpatient Hospital Stay (HOSPITAL_COMMUNITY)
Admission: EM | Admit: 2012-12-12 | Discharge: 2012-12-14 | DRG: 062 | Disposition: A | Discharge status: Home / Self Care | Payer: PRIVATE HEALTH INSURANCE | Attending: Neurology | Admitting: Neurology

## 2012-12-12 DIAGNOSIS — N400 Enlarged prostate without lower urinary tract symptoms: Secondary | ICD-10-CM | POA: Diagnosis present

## 2012-12-12 DIAGNOSIS — I2510 Atherosclerotic heart disease of native coronary artery without angina pectoris: Secondary | ICD-10-CM | POA: Diagnosis present

## 2012-12-12 DIAGNOSIS — E785 Hyperlipidemia, unspecified: Secondary | ICD-10-CM | POA: Diagnosis present

## 2012-12-12 DIAGNOSIS — I6350 Cerebral infarction due to unspecified occlusion or stenosis of unspecified cerebral artery: Secondary | ICD-10-CM

## 2012-12-12 DIAGNOSIS — Z6832 Body mass index (BMI) 32.0-32.9, adult: Secondary | ICD-10-CM

## 2012-12-12 DIAGNOSIS — I10 Essential (primary) hypertension: Secondary | ICD-10-CM | POA: Diagnosis present

## 2012-12-12 DIAGNOSIS — N184 Chronic kidney disease, stage 4 (severe): Secondary | ICD-10-CM | POA: Diagnosis present

## 2012-12-12 DIAGNOSIS — E1129 Type 2 diabetes mellitus with other diabetic kidney complication: Secondary | ICD-10-CM | POA: Diagnosis present

## 2012-12-12 DIAGNOSIS — Z87891 Personal history of nicotine dependence: Secondary | ICD-10-CM

## 2012-12-12 DIAGNOSIS — Z9089 Acquired absence of other organs: Secondary | ICD-10-CM

## 2012-12-12 DIAGNOSIS — Z9849 Cataract extraction status, unspecified eye: Secondary | ICD-10-CM

## 2012-12-12 DIAGNOSIS — Z8551 Personal history of malignant neoplasm of bladder: Secondary | ICD-10-CM

## 2012-12-12 DIAGNOSIS — N058 Unspecified nephritic syndrome with other morphologic changes: Secondary | ICD-10-CM | POA: Diagnosis present

## 2012-12-12 DIAGNOSIS — I639 Cerebral infarction, unspecified: Secondary | ICD-10-CM

## 2012-12-12 DIAGNOSIS — Z794 Long term (current) use of insulin: Secondary | ICD-10-CM

## 2012-12-12 DIAGNOSIS — Z79899 Other long term (current) drug therapy: Secondary | ICD-10-CM

## 2012-12-12 DIAGNOSIS — Z961 Presence of intraocular lens: Secondary | ICD-10-CM

## 2012-12-12 DIAGNOSIS — Z006 Encounter for examination for normal comparison and control in clinical research program: Secondary | ICD-10-CM

## 2012-12-12 DIAGNOSIS — Z7902 Long term (current) use of antithrombotics/antiplatelets: Secondary | ICD-10-CM

## 2012-12-12 DIAGNOSIS — G8190 Hemiplegia, unspecified affecting unspecified side: Secondary | ICD-10-CM | POA: Diagnosis present

## 2012-12-12 DIAGNOSIS — E669 Obesity, unspecified: Secondary | ICD-10-CM | POA: Diagnosis present

## 2012-12-12 DIAGNOSIS — Z888 Allergy status to other drugs, medicaments and biological substances status: Secondary | ICD-10-CM

## 2012-12-12 DIAGNOSIS — I129 Hypertensive chronic kidney disease with stage 1 through stage 4 chronic kidney disease, or unspecified chronic kidney disease: Secondary | ICD-10-CM | POA: Diagnosis present

## 2012-12-12 DIAGNOSIS — I252 Old myocardial infarction: Secondary | ICD-10-CM

## 2012-12-12 DIAGNOSIS — I6340 Cerebral infarction due to embolism of unspecified cerebral artery: Principal | ICD-10-CM | POA: Diagnosis present

## 2012-12-12 DIAGNOSIS — R4701 Aphasia: Secondary | ICD-10-CM | POA: Diagnosis present

## 2012-12-12 DIAGNOSIS — Z9861 Coronary angioplasty status: Secondary | ICD-10-CM

## 2012-12-12 LAB — COMPREHENSIVE METABOLIC PANEL
Alkaline Phosphatase: 77 U/L (ref 39–117)
BUN: 26 mg/dL — ABNORMAL HIGH (ref 6–23)
CO2: 24 mEq/L (ref 19–32)
Chloride: 102 mEq/L (ref 96–112)
GFR calc Af Amer: 29 mL/min — ABNORMAL LOW (ref >90)
GFR calc non Af Amer: 25 mL/min — ABNORMAL LOW (ref >90)
Glucose, Bld: 166 mg/dL — ABNORMAL HIGH (ref 70–99)
Potassium: 3.7 mEq/L (ref 3.5–5.1)
Total Bilirubin: 0.6 mg/dL (ref 0.3–1.2)
Total Protein: 7.4 g/dL (ref 6.0–8.3)

## 2012-12-12 LAB — TROPONIN I: Troponin I: <0.3 ng/mL (ref <0.30)

## 2012-12-12 LAB — POCT I-STAT TROPONIN I: Troponin i, poc: 0.01 ng/mL (ref 0.00–0.08)

## 2012-12-12 LAB — DIFFERENTIAL
Eosinophils Absolute: 0.2 10*3/uL (ref 0.0–0.7)
Lymphocytes Relative: 21 % (ref 12–46)
Lymphs Abs: 1.3 10*3/uL (ref 0.7–4.0)
Monocytes Relative: 9 % (ref 3–12)
Neutrophils Relative %: 67 % (ref 43–77)

## 2012-12-12 LAB — POCT I-STAT, CHEM 8
HCT: 42 % (ref 39.0–52.0)
Hemoglobin: 14.3 g/dL (ref 13.0–17.0)
Potassium: 3.8 mEq/L (ref 3.5–5.1)
Sodium: 143 mEq/L (ref 135–145)
TCO2: 24 mmol/L (ref 0–100)

## 2012-12-12 LAB — CBC
Hemoglobin: 13.6 g/dL (ref 13.0–17.0)
MCH: 29.1 pg (ref 26.0–34.0)
MCV: 86.5 fL (ref 78.0–100.0)
RBC: 4.67 MIL/uL (ref 4.22–5.81)

## 2012-12-12 LAB — PROTIME-INR: INR: 1.02 (ref 0.00–1.49)

## 2012-12-12 MED ORDER — SENNOSIDES-DOCUSATE SODIUM 8.6-50 MG PO TABS
1.0000 | ORAL_TABLET | Freq: Every evening | ORAL | Status: DC | PRN
  Filled 2012-12-12: qty 1

## 2012-12-12 MED ORDER — PANTOPRAZOLE SODIUM 40 MG IV SOLR: 40.0000 mg | Freq: Every day | INTRAVENOUS | Status: DC

## 2012-12-12 MED ORDER — ACETAMINOPHEN 325 MG PO TABS: 650.0000 mg | ORAL_TABLET | ORAL | Status: DC | PRN

## 2012-12-12 MED ORDER — ACETAMINOPHEN 650 MG RE SUPP: 650.0000 mg | RECTAL | Status: DC | PRN

## 2012-12-12 MED ORDER — SODIUM CHLORIDE 0.9 % IV SOLN
INTRAVENOUS | Status: DC
  Administered 2012-12-12: 14:00:00 via INTRAVENOUS

## 2012-12-12 MED ORDER — SODIUM CHLORIDE 0.9 % IV BOLUS (SEPSIS)
1000.0000 mL | Freq: Once | INTRAVENOUS | Status: AC
  Administered 2012-12-12: 1000 mL via INTRAVENOUS

## 2012-12-12 MED ORDER — PANTOPRAZOLE SODIUM 40 MG PO TBEC
40.0000 mg | DELAYED_RELEASE_TABLET | Freq: Every day | ORAL | Status: DC
  Administered 2012-12-12 – 2012-12-13 (×2): 40 mg via ORAL
  Filled 2012-12-12 (×2): qty 1

## 2012-12-12 MED ORDER — STUDY - INVESTIGATIONAL DRUG SIMPLE RECORD
90.0000 mg | Freq: Once | Status: AC
  Administered 2012-12-12: 90 mg via INTRAVENOUS
  Filled 2012-12-12: qty 90

## 2012-12-12 MED ORDER — STUDY - INVESTIGATIONAL DRUG SIMPLE RECORD
0.9000 mg/kg | Freq: Once | Status: DC
  Filled 2012-12-12: qty 97.2

## 2012-12-12 MED ORDER — LABETALOL HCL 5 MG/ML IV SOLN: 10.0000 mg | INTRAVENOUS | Status: DC | PRN

## 2012-12-12 MED ORDER — STUDY - INVESTIGATIONAL DRUG SIMPLE RECORD
325.0000 mg | Freq: Once | Status: AC
  Administered 2012-12-12: 325 mg via ORAL
  Filled 2012-12-12: qty 325

## 2012-12-12 NOTE — ED Notes (Signed)
Family at bedside speaking with stroke team.

## 2012-12-12 NOTE — Code Documentation (Addendum)
79yo male woke up this morning at 0630 and cooked breakfast.  At 0700 patient developed right sided hemiplegia, right facial droop and difficulty speaking.  He subsequently fell which was witnessed by the patient's wife.  EMS notified and patient transported to [facility] by [facility] and patient began improving en route.  Code stroke called at 0752, patient arrived at 0808, cleared by EDP at 0809, Stroke Team arrived at [phone].  NIHSS on arrival is 2 for right leg drift and right facial droop.  Patient has right upper extremity weakness, but no drift on exam.  On reassessment NIHSS 1 for right leg drift, R facial droop now cleared.  Patient is "too good to treat" with tPA, but patient may be a candidate for the [employer] clinical research trial.  Research staff notified and will assess for enrollment in clinical trial.  Patient has a h/o bladder tumor with bleeding, last episode of bleeding was in May per patient.  Dr. Jerre Simon, patient's urologist, contacted by Stroke Team and Dr. Leroy Kennedy discussed possible treatment with IV tPA, this is acceptable with patient's condition per MD.  If bleeding does occur, Dr. Jerre Simon feels that there are measures to control this.  Stroke education completed with patient's family at bedside and questions answered.  Bedside handoff completed with Joni Reining, ED RN.

## 2012-12-12 NOTE — Progress Notes (Signed)
Pt passed stroke swallow screen. Notified Dr. Leroy Kennedy; regular diet ordered. Will continue to monitor.

## 2012-12-12 NOTE — Consult Note (Addendum)
Referring Physician: ED    Chief Complaint: Code stroke. Right hemiplegia and aphasia  HPI:                                                                                                                                         Craig Castillo is an 79 y.o. male with a past medical history significant for hypertension, dyslipidemia, DM, CAD s/p stenting, MI, CKD, bladder cancer with last procedure done 5/14, brought to [facility] ED by medics due to acute onset right hemiplegia and aphasia. Never has similar symptoms before. He was last known well at 7 am today. Cook breakfast and subsequently developed inability to move the right side and couldn't speak. Denies associated headache, nausea, vomiting, confusion, double vision, chest pain or palpitations. Upon arrival to ED had NIHSS 2 with improvement to NIHSS1( mild right weakness) after returning from CT. CT brain reported a vague area of low attenuation identified at right globus pallidus question acute infarct.  Date last known well: 12/12/12 Time last known well: 7 AM.  tPA Given: yes NIHSS: 1  MRS:   Past Medical History  Diagnosis Date  . ASCVD (arteriosclerotic cardiovascular disease)   . Dyspnea   . Dyslipidemia   . DJD (degenerative joint disease)   . Hypertension   . Diabetes mellitus   . Myocardial infarction 1997  . BPH (benign prostatic hyperplasia)   . CKD (chronic kidney disease) stage 3, GFR 30-59 ml/min   . Coronary artery disease   . Bladder cancer     Past Surgical History  Procedure Laterality Date  . Cholecystectomy    . Esophagogastroduodenoscopy  06/08/2002    Soft stricture of the GE  junction with erosive esophagitis. The stricture was dilated to 76 [place] using a Maloney dilator. Swollen fold at GE junction on the gastric site which was biopsied for histology and was suspicious for sentinel folds.  . Colonoscopy  06/08/2002    Small polyp was oblated via cold biopsy from the cecum and two were snared, one was at  the rectosigmoid junction measuring about a centimeter, another one at the rectum which was smaller.  . Transurethral resection of bladder tumor      x7  . Coronary angioplasty  1997    3 stents  . Transurethral resection of bladder tumor  03/15/2012    Procedure: TRANSURETHRAL RESECTION OF BLADDER TUMOR (TURBT);  Surgeon: Ky Barban, MD;  Location: [facility];  Service: Urology;  Laterality: N/A;  . Cataract extraction w/phaco  06/20/2012    Procedure: CATARACT EXTRACTION PHACO AND INTRAOCULAR LENS PLACEMENT (IOC);  Surgeon: Gemma Payor, MD;  Location: [facility];  Service: Ophthalmology;  Laterality: Right;  CDE=16.59  . Cataract extraction w/phaco Left 07/04/2012    Procedure: CATARACT EXTRACTION PHACO AND INTRAOCULAR LENS PLACEMENT (IOC);  Surgeon: Gemma Payor, MD;  Location: [facility];  Service: Ophthalmology;  Laterality: Left;  CDE: 24.33  . Transurethral resection of bladder tumor N/A 09/29/2012    Procedure: TRANSURETHRAL RESECTION OF BLADDER TUMOR (TURBT);  Surgeon: Ky Barban, MD;  Location: [facility];  Service: Urology;  Laterality: N/A;    Family History  Problem Relation Age of Onset  . Stroke Mother 39  . Diabetes Brother 90   Social History:  reports that he quit smoking about 31 years ago. His smoking use included Cigarettes. He has a 10 pack-year smoking history. He has never used smokeless tobacco. He reports that he does not drink alcohol or use illicit drugs.  Allergies:  Allergies  Allergen Reactions  . Celecoxib Swelling  . Hydrocodone-Acetaminophen Itching  . Morphine Itching  . Niacin Itching  . Piroxicam Itching    Medications:                                                                                                                           I have reviewed the patient's current medications.  ROS:                                                                                                                                       History obtained from the  patient and family  General ROS: negative for - chills, fatigue, fever, night sweats, weight gain or weight loss Psychological ROS: negative for - behavioral disorder, hallucinations, memory difficulties, mood swings or suicidal ideation Ophthalmic ROS: negative for - blurry vision, double vision, eye pain or loss of vision ENT ROS: negative for - epistaxis, nasal discharge, oral lesions, sore throat, tinnitus or vertigo Allergy and Immunology ROS: negative for - hives or itchy/watery eyes Hematological and Lymphatic ROS: negative for - bleeding problems, bruising or swollen lymph nodes Endocrine ROS: negative for - galactorrhea, hair pattern changes, polydipsia/polyuria or temperature intolerance Respiratory ROS: negative for - cough, hemoptysis, shortness of breath or wheezing Cardiovascular ROS: negative for - chest pain, dyspnea on exertion, edema or irregular heartbeat Gastrointestinal ROS: negative for - abdominal pain, diarrhea, hematemesis, nausea/vomiting or stool incontinence Genito-Urinary ROS: negative for - dysuria, hematuria in the past 2 weeks, incontinence or urinary frequency/urgency Musculoskeletal ROS: negative for - joint swelling Neurological ROS: as noted in HPI Dermatological ROS: negative for rash and skin lesion changes    Physical exam: pleasant male in no apparent distress. Blood pressure 171/84, pulse 103, temperature 98.3  F (36.8 C), temperature source Oral, resp. rate 14, height 5\' 8"  (1.727 m), weight 107.956 kg (238 lb), SpO2 97.00%. Head: normocephalic. Neck: supple, no bruits, no JVD. Cardiac: no murmurs. Lungs: clear. Abdomen: soft, no tender, no mass. Extremities: no edema.    Neurologic Examination:                                                                                                      Mental Status: Alert, oriented, thought content appropriate.  Speech fluent without evidence of aphasia.  Able to follow 3 step commands without  difficulty. Cranial Nerves: II: Discs flat bilaterally; Visual fields grossly normal, pupils equal, round, reactive to light and accommodation III,IV, VI: ptosis not present, extra-ocular motions intact bilaterally V,VII: smile symmetric, facial light touch sensation normal bilaterally VIII: hearing normal bilaterally IX,X: gag reflex present XI: bilateral shoulder shrug XII: midline tongue extension Motor: Mild right sided weakness  Tone and bulk:normal tone throughout; no atrophy noted Sensory: Pinprick and light touch intact throughout, bilaterally Deep Tendon Reflexes:  1+ all pver  Plantars: Right: downgoing   Left: downgoing Cerebellar: normal finger-to-nose,  normal heel-to-shin test Gait:  No ataxia CV: pulses palpable throughout   Results for orders placed during the hospital encounter of 12/12/12 (from the past 48 hour(s))  PROTIME-INR     Status: None   Collection Time    12/12/12  8:05 AM      Result Value Range   Prothrombin Time 13.2  11.6 - 15.2 seconds   INR 1.02  0.00 - 1.49  APTT     Status: None   Collection Time    12/12/12  8:05 AM      Result Value Range   aPTT 32  24 - 37 seconds  CBC     Status: None   Collection Time    12/12/12  8:05 AM      Result Value Range   WBC 6.0  4.0 - 10.5 K/uL   RBC 4.67  4.22 - 5.81 MIL/uL   Hemoglobin 13.6  13.0 - 17.0 g/dL   HCT 40.9  81.1 - 91.4 %   MCV 86.5  78.0 - 100.0 fL   MCH 29.1  26.0 - 34.0 pg   MCHC 33.7  30.0 - 36.0 g/dL   RDW 78.2  95.6 - 21.3 %   Platelets 234  150 - 400 K/uL  DIFFERENTIAL     Status: None   Collection Time    12/12/12  8:05 AM      Result Value Range   Neutrophils Relative % 67  43 - 77 %   Neutro Abs 4.0  1.7 - 7.7 K/uL   Lymphocytes Relative 21  12 - 46 %   Lymphs Abs 1.3  0.7 - 4.0 K/uL   Monocytes Relative 9  3 - 12 %   Monocytes Absolute 0.5  0.1 - 1.0 K/uL   Eosinophils Relative 3  0 - 5 %   Eosinophils Absolute 0.2  0.0 - 0.7 K/uL   Basophils Relative 1  0 - 1 %  Basophils Absolute 0.0  0.0 - 0.1 K/uL  TROPONIN I     Status: None   Collection Time    12/12/12  8:05 AM      Result Value Range   Troponin I <0.30  <0.30 ng/mL   Comment:            Due to the release kinetics of cTnI,     a negative result within the first hours     of the onset of symptoms does not rule out     myocardial infarction with certainty.     If myocardial infarction is still suspected,     repeat the test at appropriate intervals.  POCT I-STAT TROPONIN I     Status: None   Collection Time    12/12/12  8:24 AM      Result Value Range   Troponin i, poc 0.01  0.00 - 0.08 ng/mL   Comment 3            Comment: Due to the release kinetics of cTnI,     a negative result within the first hours     of the onset of symptoms does not rule out     myocardial infarction with certainty.     If myocardial infarction is still suspected,     repeat the test at appropriate intervals.  POCT I-STAT, CHEM 8     Status: Abnormal   Collection Time    12/12/12  8:25 AM      Result Value Range   Sodium 143  135 - 145 mEq/L   Potassium 3.8  3.5 - 5.1 mEq/L   Chloride 108  96 - 112 mEq/L   BUN 27 (*) 6 - 23 mg/dL   Creatinine, Ser 5.78 (*) 0.50 - 1.35 mg/dL   Glucose, Bld 469 (*) 70 - 99 mg/dL   Calcium, Ion 6.29  5.28 - 1.30 mmol/L   TCO2 24  0 - 100 mmol/L   Hemoglobin 14.3  13.0 - 17.0 g/dL   HCT 41.3  24.4 - 01.0 %   Ct Head Wo Contrast  12/12/2012   *RADIOLOGY REPORT*  Clinical Data: Code stroke, sudden onset right side weakness, right facial droop  CT HEAD WITHOUT CONTRAST  Technique:  Contiguous axial images were obtained from the base of the skull through the vertex without contrast.  Comparison: None  Findings: Generalized atrophy. Normal ventricular morphology. No midline shift or mass effect. Small vessel chronic ischemic changes of deep cerebral white matter. Vague area of low attenuation identified at right globus pallidus question acute infarct. No intracranial  hemorrhage, mass lesion, or acute cortical infarction. Mucosal retention cyst and mucosal thickening in left maxillary sinus. Atherosclerotic calcifications of internal carotid and vertebral arteries at skull base. Visualized paranasal sinuses and mastoid air cells otherwise clear. Bones unremarkable.  IMPRESSION: Small vessel chronic ischemic changes of deep cerebral white matter. Question acute infarct at right globus pallidus.  Findings called to Dr. Leroy Kennedy on 12/12/2012 at 0836 hours.   Original Report Authenticated By: Ulyses Southward, M.D.      Assessment: 79 y.o. male with multiple risk factors for stroke brought to ED with acute hemiplegia and aphasia that dramatically improved and now NIHSS 1. CT brain unimpressive, ? Vague right globus pallidus low attenuation that doesn't anatomically correspond to patient's neurological syndrome. I think that he could be a good candidate to be enrolled in the [employer] trial Patient is willing to explore that pathway and our research nurse  involve in that trial was contacted. I also talked on the phone with patient's urologist Dr. Frann Rider who told that there is not contraindications from an urological standpoint to pursue thrombolysis and that in case of urological bleeding he will be able to correct that bleeding.    Stroke Risk Factors - age,  hypertension, dyslipidemia, DM, CAD   Plan: 1. HgbA1c, fasting lipid panel 2. MRI, MRA  of the brain without contrast 3. Echocardiogram 4. Carotid dopplers 5. Prophylactic therapy- 6. Risk factor modification 7. Telemetry monitoring 8. Frequent neuro checks 9. PT/OT SLP   Wyatt Portela, MD [facility] [phone]  12/12/2012, 9:00 AM  Patient informed of risk and benefits and he agreed with IV tpa.  Wyatt Portela ,MD

## 2012-12-12 NOTE — ED Notes (Signed)
Report received from [place], [place]

## 2012-12-12 NOTE — Progress Notes (Signed)
The ED nurse reported to patient was in NSR. The patient is now in 1st degree heart block. Notified Dr. Leroy Kennedy; no new orders received. Will continue to monitor.

## 2012-12-12 NOTE — Progress Notes (Signed)
  Echocardiogram 2D Echocardiogram has been performed.  Tyheem Boughner FRANCES 12/12/2012, 5:38 PM

## 2012-12-12 NOTE — ED Provider Notes (Signed)
History    CSN: [id number] Arrival date & time 12/12/12  0808  First MD Initiated Contact with Patient 12/12/12 [phone]     Chief Complaint  Patient presents with  . Code Stroke   (Consider location/radiation/quality/duration/timing/severity/associated sxs/prior Treatment) HPI Comments: Patient by EMS as Code Stroke.  He woke this morning and was feeling well.  He then developed the sudden onset of weakness in the left arm, leg, and garbled speech.  911 was called and he was transported here.  This has never happened to him before.  He does have a history of DM and CAD with stents.  He denies being on any blood thinners, but does report having had bladder surgery just over two months ago.  No hematuria.  Patient is a 79 y.o. male presenting with Acute Neurological Problem. The history is provided by the patient.  Cerebrovascular Accident This is a new problem. Episode onset: 7AM. The problem occurs constantly. The problem has been gradually improving. Pertinent negatives include no chest pain, no abdominal pain, no headaches and no shortness of breath. Nothing aggravates the symptoms. Nothing relieves the symptoms. He has tried nothing for the symptoms.   Past Medical History  Diagnosis Date  . ASCVD (arteriosclerotic cardiovascular disease)   . Dyspnea   . Dyslipidemia   . DJD (degenerative joint disease)   . Hypertension   . Diabetes mellitus   . Myocardial infarction 1997  . BPH (benign prostatic hyperplasia)   . CKD (chronic kidney disease) stage 3, GFR 30-59 ml/min   . Coronary artery disease   . Bladder cancer    Past Surgical History  Procedure Laterality Date  . Cholecystectomy    . Esophagogastroduodenoscopy  06/08/2002    Soft stricture of the GE  junction with erosive esophagitis. The stricture was dilated to 12 [place] using a Maloney dilator. Swollen fold at GE junction on the gastric site which was biopsied for histology and was suspicious for sentinel folds.  .  Colonoscopy  06/08/2002    Small polyp was oblated via cold biopsy from the cecum and two were snared, one was at the rectosigmoid junction measuring about a centimeter, another one at the rectum which was smaller.  . Transurethral resection of bladder tumor      x7  . Coronary angioplasty  1997    3 stents  . Transurethral resection of bladder tumor  03/15/2012    Procedure: TRANSURETHRAL RESECTION OF BLADDER TUMOR (TURBT);  Surgeon: Ky Barban, MD;  Location: [facility];  Service: Urology;  Laterality: N/A;  . Cataract extraction w/phaco  06/20/2012    Procedure: CATARACT EXTRACTION PHACO AND INTRAOCULAR LENS PLACEMENT (IOC);  Surgeon: Gemma Payor, MD;  Location: [facility];  Service: Ophthalmology;  Laterality: Right;  CDE=16.59  . Cataract extraction w/phaco Left 07/04/2012    Procedure: CATARACT EXTRACTION PHACO AND INTRAOCULAR LENS PLACEMENT (IOC);  Surgeon: Gemma Payor, MD;  Location: [facility];  Service: Ophthalmology;  Laterality: Left;  CDE: 24.33  . Transurethral resection of bladder tumor N/A 09/29/2012    Procedure: TRANSURETHRAL RESECTION OF BLADDER TUMOR (TURBT);  Surgeon: Ky Barban, MD;  Location: [facility];  Service: Urology;  Laterality: N/A;   Family History  Problem Relation Age of Onset  . Stroke Mother 55  . Diabetes Brother 43   History  Substance Use Topics  . Smoking status: Former Smoker -- 0.50 packs/day for 20 years    Types: Cigarettes    Quit date: 03/08/1981  . Smokeless tobacco: Never  Used  . Alcohol Use: No    Review of Systems  Respiratory: Negative for shortness of breath.   Cardiovascular: Negative for chest pain.  Gastrointestinal: Negative for abdominal pain.  Neurological: Negative for headaches.  All other systems reviewed and are negative.    Allergies  Celecoxib; Hydrocodone-acetaminophen; Morphine; Niacin; and Piroxicam  Home Medications   Current Outpatient Rx  Name  Route  Sig  Dispense  Refill  . amLODipine (NORVASC) 10 MG tablet    Oral   Take 10 mg by mouth daily.           . ciprofloxacin (CIPRO) 500 MG tablet   Oral   Take 1 tablet (500 mg total) by mouth 2 (two) times daily.   14 tablet   0   . gemfibrozil (LOPID) 600 MG tablet   Oral   Take 600 mg by mouth 2 (two) times daily.          . insulin NPH (HUMULIN N,NOVOLIN N) 100 UNIT/ML injection   Subcutaneous   Inject 20 Units into the skin 2 (two) times daily.          . metoprolol (LOPRESSOR) 50 MG tablet   Oral   Take 50 mg by mouth 2 (two) times daily.           . nitroGLYCERIN (NITROSTAT) 0.4 MG SL tablet   Sublingual   Place 0.4 mg under the tongue as directed. Chest Pain         . omeprazole (PRILOSEC) 20 MG capsule   Oral   Take 20 mg by mouth daily.           . Tamsulosin HCl (FLOMAX) 0.4 MG CAPS   Oral   Take 0.4 mg by mouth daily.           There were no vitals taken for this visit. Physical Exam  Nursing note and vitals reviewed. Constitutional: He is oriented to person, place, and time. He appears well-developed and well-nourished. No distress.  HENT:  Head: Normocephalic and atraumatic.  Mouth/Throat: Oropharynx is clear and moist.  Eyes: EOM are normal. Pupils are equal, round, and reactive to light.  Neck: Normal range of motion. Neck supple.  Cardiovascular: Normal rate, regular rhythm and normal heart sounds.   No murmur heard. Pulmonary/Chest: Effort normal and breath sounds normal. No respiratory distress. He has no wheezes.  Abdominal: Soft. Bowel sounds are normal. He exhibits no distension. There is no tenderness.  Musculoskeletal: Normal range of motion. He exhibits no edema.  Neurological: He is alert and oriented to person, place, and time.  There is a slight, right-sided facial droop noted.  The strength is 4/5 in the BUE and BLE.  Finger to nose and heel to shin is somewhat slowed in the right arm and leg.    Skin: Skin is warm and dry. He is not diaphoretic.    ED Course  Procedures (including  critical care time) Labs Reviewed  ETHANOL  PROTIME-INR  APTT  CBC  DIFFERENTIAL  COMPREHENSIVE METABOLIC PANEL  TROPONIN I  URINE RAPID DRUG SCREEN (HOSP PERFORMED)  URINALYSIS, ROUTINE W REFLEX MICROSCOPIC   No results found. No diagnosis found.   Date: 12/12/2012  Rate: 106  Rhythm: sinus tachycardia  QRS Axis: normal  Intervals: normal  ST/T Wave abnormalities: nonspecific T wave changes  Conduction Disutrbances:right bundle branch block  Narrative Interpretation:   Old EKG Reviewed: unchanged    MDM  The patient presents with what appears to be  an acute cva.  His symptoms are improving somewhat, but are still pronounced.  He arrived as a Code stroke and this protocol was followed.  The ct reveals a possible acute infarct in the right globus pallidus, however this does not anatomically match up with his symptoms.  He was seen by Neurology.  It is the opinion of Dr. Leroy Kennedy that he is a candidate for tPa and this will be initiated.  He will be admitted to the neurology service.  CRITICAL CARE Performed by: Geoffery Lyons Total critical care time: 30 minutes Critical care time was exclusive of separately billable procedures and treating other patients. Critical care was necessary to treat or prevent imminent or life-threatening deterioration. Critical care was time spent personally by me on the following activities: development of treatment plan with patient and/or surrogate as well as nursing, discussions with consultants, evaluation of patient's response to treatment, examination of patient, obtaining history from patient or surrogate, ordering and performing treatments and interventions, ordering and review of laboratory studies, ordering and review of radiographic studies, pulse oximetry and re-evaluation of patient's condition.   Geoffery Lyons, MD 12/12/12 [phone]

## 2012-12-12 NOTE — Progress Notes (Signed)
Pt neuro status declined at 1030. Pt slurred speech and facial droop worsened. The RUE and RLE became weaker. NIH stroke scale is now a 8. Sammuel Cooper, RN and Gridley Sink Mintmier at bedside. Pt had a skin tear on his right elbow that started to bleed; new dressing applied. Condom cath applied and urine viewed; no blood visible in urine. Notified Dr. Leroy Kennedy; CT of head ordered. Dr. Leroy Kennedy to bedside after CT completed. Will transfer to MRI/MRA. 500 CC bolus given per MD order. Pt's neuro status continues to fluctuate; MD aware. Will continue to monitor.

## 2012-12-12 NOTE — ED Notes (Signed)
Per EMS- pt woke this morning at 630. Pt reports feeling normal at that time. Pt states that at 0700 he had rt sided weakness, rt facial droop, and difficulty speaking. Pt states that wife was present. Symptoms began improving with EMS. Pt also fell during this time and has abrasion to rt arm.

## 2012-12-12 NOTE — Progress Notes (Signed)
UR completed 

## 2012-12-12 NOTE — Progress Notes (Signed)
VASCULAR LAB PRELIMINARY  PRELIMINARY  PRELIMINARY  PRELIMINARY  Carotid Dopplers completed.    Preliminary report:  There is 0-39% ICA stenosis.  Vertebral artery flow is antegrade.  Nataly Pacifico, RVT 12/12/2012, 3:45 PM

## 2012-12-13 ENCOUNTER — Inpatient Hospital Stay (HOSPITAL_COMMUNITY): Payer: PRIVATE HEALTH INSURANCE

## 2012-12-13 DIAGNOSIS — I6350 Cerebral infarction due to unspecified occlusion or stenosis of unspecified cerebral artery: Secondary | ICD-10-CM

## 2012-12-13 LAB — URINALYSIS, ROUTINE W REFLEX MICROSCOPIC
Glucose, UA: NEGATIVE mg/dL
Nitrite: NEGATIVE
Specific Gravity, Urine: 1.011 (ref 1.005–1.030)
pH: 6.5 (ref 5.0–8.0)

## 2012-12-13 LAB — LIPID PANEL
Cholesterol: 130 mg/dL (ref 0–200)
Cholesterol: 131 mg/dL (ref 0–200)
Total CHOL/HDL Ratio: 5.7 RATIO
Triglycerides: 122 mg/dL (ref <150)
VLDL: 24 mg/dL (ref 0–40)

## 2012-12-13 LAB — URINE MICROSCOPIC-ADD ON

## 2012-12-13 LAB — RAPID URINE DRUG SCREEN, HOSP PERFORMED: Barbiturates: NOT DETECTED

## 2012-12-13 LAB — GLUCOSE, CAPILLARY: Glucose-Capillary: 89 mg/dL (ref 70–99)

## 2012-12-13 LAB — HEMOGLOBIN A1C: Mean Plasma Glucose: 114 mg/dL (ref <117)

## 2012-12-13 MED ORDER — CLOPIDOGREL BISULFATE 75 MG PO TABS
75.0000 mg | ORAL_TABLET | Freq: Every day | ORAL | Status: DC
  Administered 2012-12-13 – 2012-12-14 (×2): 75 mg via ORAL
  Filled 2012-12-13 (×4): qty 1

## 2012-12-13 MED ORDER — EZETIMIBE 10 MG PO TABS
10.0000 mg | ORAL_TABLET | Freq: Every day | ORAL | Status: DC
  Administered 2012-12-13 – 2012-12-14 (×2): 10 mg via ORAL
  Filled 2012-12-13 (×2): qty 1

## 2012-12-13 MED ORDER — CEPHALEXIN 250 MG PO CAPS
250.0000 mg | ORAL_CAPSULE | Freq: Two times a day (BID) | ORAL | Status: DC
  Administered 2012-12-13 – 2012-12-14 (×2): 250 mg via ORAL
  Filled 2012-12-13 (×4): qty 1

## 2012-12-13 NOTE — Progress Notes (Signed)
Stroke Team Progress Note  HISTORY Craig Castillo is an 79 y.o. male with a past medical history significant for hypertension, dyslipidemia, DM, CAD s/p stenting, MI, CKD, bladder cancer with last procedure done 5/14, brought to [facility] ED by medics due to acute onset right hemiplegia and aphasia. Never has similar symptoms before.  He was last known well at 7 am today. Cook breakfast and subsequently developed inability to move the right side and couldn't speak. Denies associated headache, nausea, vomiting, confusion, double vision, chest pain or palpitations.  Upon arrival to ED had NIHSS 2 with improvement to NIHSS1( mild right weakness) after returning from CT.  CT brain reported a vague area of low attenuation identified at right globus pallidus question acute infarct. Patient participated in the PRISMS trial of IV t-PA versus placebo in patients with minor ischemic strokes which are not disabling. He did have fluctuating neurological deficits with transient worsening for 10 minutes on several occasions yesterday.  Date last known well: 12/12/12  Time last known well: 7 AM.  tPA Given: yes  NIHSS: 1   . He was admitted to the neuro ICU for further evaluation and treatment.  SUBJECTIVE  Patient lying in bed. He feels like his symptoms are quickly resolving.  OBJECTIVE Most recent Vital Signs: Filed Vitals:   12/13/12 0400 12/13/12 0500 12/13/12 0600 12/13/12 0700  BP: 166/80 160/79 154/83 160/81  Pulse: 87 79 85 85  Temp:      TempSrc:      Resp: 18 14 17 17   Height:      Weight:      SpO2: 99% 99% 98% 99%   CBG (last 3)   Recent Labs  12/12/12 1918  GLUCAP 82    IV Fluid Intake:   . sodium chloride 75 mL/hr at 12/13/12 0700    MEDICATIONS  . pantoprazole  40 mg Oral Daily   PRN:  acetaminophen, acetaminophen, labetalol, senna-docusate  Diet:  General thin liquids Activity:  Bedrest DVT Prophylaxis:  SCD  CLINICALLY SIGNIFICANT STUDIES Basic Metabolic Panel:   Recent Labs Lab 12/12/12 0805 12/12/12 0825  NA 138 143  K 3.7 3.8  CL 102 108  CO2 24  --   GLUCOSE 166* 167*  BUN 26* 27*  CREATININE 2.37* 2.10*  CALCIUM 9.5  --    Liver Function Tests:  Recent Labs Lab 12/12/12 0805  AST 14  ALT 7  ALKPHOS 77  BILITOT 0.6  PROT 7.4  ALBUMIN 3.4*   CBC:  Recent Labs Lab 12/12/12 0805 12/12/12 0825  WBC 6.0  --   NEUTROABS 4.0  --   HGB 13.6 14.3  HCT 40.4 42.0  MCV 86.5  --   PLT 234  --    Coagulation:  Recent Labs Lab 12/12/12 0805  LABPROT 13.2  INR 1.02   Cardiac Enzymes:  Recent Labs Lab 12/12/12 0805  TROPONINI <0.30   Urinalysis: No results found for this basename: COLORURINE, APPERANCEUR, LABSPEC, PHURINE, GLUCOSEU, HGBUR, BILIRUBINUR, KETONESUR, PROTEINUR, UROBILINOGEN, NITRITE, LEUKOCYTESUR,  in the last 168 hours Lipid Panel    Component Value Date/Time   CHOL 130 12/13/2012 0510   TRIG 122 12/13/2012 0510   HDL 23* 12/13/2012 0510   CHOLHDL 5.7 12/13/2012 0510   VLDL 24 12/13/2012 0510   LDLCALC 83 12/13/2012 0510   HgbA1C  Lab Results  Component Value Date   HGBA1C  Value: 8.1 (NOTE) The ADA recommends the following therapeutic goal for glycemic control related to Hgb A1c measurement:  Goal of therapy: <6.5 Hgb A1c  Reference: American Diabetes Association: Clinical Practice Recommendations 2010, Diabetes Care, 2010, 33: (Suppl  1).* 08/29/2009    Urine Drug Screen:   No results found for this basename: labopia, cocainscrnur, labbenz, amphetmu, thcu, labbarb    Alcohol Level:  Recent Labs Lab 12/12/12 0805  ETH <11    Ct Head Wo Contrast 12/12/2012   1.  This examination is unchanged compared to the prior study obtained 12/12/2012 at 08:15 a.m., again demonstrating a potential area of age indeterminate ischemia (i.e., potentially a subacute or acute) in the right globus pallidus.  If there is clinical concern for ischemia, further evaluation with brain MRI would provide additional diagnostic  information. 2.  No evidence of acute intracranial hemorrhage in this patient status post t-PA administration. 3.  Mild cerebral and cerebellar atrophy with mild chronic microvascular ischemic changes in the cerebral white matter. 4.  Mild left maxillary sinus mucosal thickening and a small mucosal retention cyst versus polyp.  Ct Head Wo Contrast (2nd) 12/12/2012  Small vessel chronic ischemic changes of deep cerebral white matter. Question acute infarct at right globus pallidus.  Findings called to Dr. Leroy Kennedy on 12/12/2012 at 0836 hours.     CT Head Wo Contrast (3rd) 12/13/2012 No intracranial hemorrhage  Mr Brain Wo Contrast 12/12/2012  Sub-centimeter acute infarction affecting the body of the caudate on the left.  Moderate chronic small vessel changes elsewhere throughout the cerebral hemispheric white matter.     Dg Chest Port 1 View 12/12/2012 Negative exam.    Mr Maxine Glenn Head/brain Wo Cm 12/12/2012 No major vessel occlusion.  Evidence of medium to small vessel atherosclerotic disease with multiple stenoses including the left A1 segment, multiple MCA branches bilaterally and the PCA branches bilaterally.     2D Echocardiogram  EF 55%. No wall motion abnormality  Carotid Doppler  Right: mild to moderate mixed plaque origin and proximal ICA.Left: moderate mixed plaque origin ICA and moderate soft plaque proximal ICA. Bilateral: 0-39% ICA stenosis. Vertebral artery flow is antegrade.  EKG  normal sinus rhythm.   Therapy Recommendations outpatient   Physical Exam   Mental Status:  Alert, oriented, thought content appropriate. Speech fluent without evidence of aphasia. Able to follow 3 step commands without difficulty.  Cranial Nerves:  II: Discs flat bilaterally; Visual fields grossly normal, pupils equal, round, reactive to light and accommodation  III,IV, VI: ptosis not present, extra-ocular motions intact bilaterally  V,VII: smile symmetric, facial light touch sensation normal bilaterally   VIII: hearing normal bilaterally  IX,X: gag reflex present  XI: bilateral shoulder shrug  XII: midline tongue extension  Motor:  Mild right sided weakness 4/5. With right grip weakness. Diminished fine finger movements on right Tone and bulk:normal tone throughout; no atrophy noted . No drift. Sensory: Pinprick and light touch intact throughout, bilaterally   ASSESSMENT Mr. Craig Castillo is a 79 y.o. male presenting with right hemiparesis, aphasia. Status post IV t-PA/PRISMS Trial at [phone]. Imaging confirms a sub-centimeter acute infarction affecting the body of the caudate on the left.. Infarct felt to be embolic secondary to unknown source.  On no antithrombotics prior to admission. Now on on no antithrombotics for secondary stroke prevention. Patient with resultant right hemiparesis/aphasia. Work up underway.   Coronary artery disease  Hyperlipidemia, LDL 83 with goal of LDL < 70 in diabetic patients.  Diabetes mellitus, hgba1c 8.1, goal < 6.5  Hospital day # 1  TREATMENT/PLAN  Add clopidogrel 75 mg orally every day  for secondary stroke prevention. Patient will not be prescribed aspirin due to allergy to celecoxib in which he has swelling.   Resume zetia  Risk factor modification   Gwendolyn Lima. Manson Passey, [facility], MBA, MHA [facility] Pager: [phone] 12/13/2012 2:47 PM This patient is critically ill and at significant risk of neurological worsening, death and care requires constant monitoring of vital signs, hemodynamics,respiratory and cardiac monitoring,review of multiple databases, neurological assessment, discussion with family, other specialists and medical decision making of high complexity. I spent 30 minutes of neurocritical care time  in the care of  this patient. I have personally obtained a history, examined the patient, evaluated imaging results, and formulated the assessment and plan of care. I agree with the above. Delia Heady, MD

## 2012-12-13 NOTE — Progress Notes (Signed)
Received Urinalysis results. Notified Guy Franco, PA; Keflex 250mg  ordered. Will continue to monitor.

## 2012-12-13 NOTE — Progress Notes (Signed)
SLP Cancellation Note  Patient Details Name: Craig Castillo MRN: [id number] DOB: January 06, 1933   Cancelled treatment:       Reason Eval/Treat Not Completed: Patient at procedure or test/unavailable. Will attempt SLE at next date.   Harlon Ditty, MA CCC-SLP [phone]  Claudine Mouton 12/13/2012, 9:20 AM

## 2012-12-13 NOTE — Progress Notes (Signed)
Spoke to Dr. Leroy Kennedy regarding ordered MRI. Pt is extremely agitated and and would need continues sedation and paralytics. However, MRI has reported only having one working IV pump. Dr. Leroy Kennedy is aware and okay with rescheduling the MRI for another time. Will continue to monitor.

## 2012-12-13 NOTE — Progress Notes (Signed)
Physical Therapy Evaluation Patient Details Name: Craig Castillo MRN: [id number] DOB: 1932-08-04 Today's Date: 12/13/2012 Time: 0950-1009 PT Time Calculation (min): 19 min  PT Assessment / Plan / Recommendation History of Present Illness  Craig Castillo is an 79 y.o. male with a past medical history significant for hypertension, dyslipidemia, DM, CAD s/p stenting, MI, CKD, bladder cancer with last procedure done 5/14, brought to [facility] ED by medics due to acute onset right hemiplegia and aphasia.  Clinical Impression  Pt admitted with CVA.  Pt with very mild balance deficits. Pt currently with functional limitations due to the deficits listed below (see PT Problem List). Feel that deficits can be addressed by Outpt PT and feel that pt will need to use a RW for safety if going home in next two days.   Pt will benefit from skilled PT to increase their independence and safety with mobility to allow discharge to the venue listed below.     PT Assessment  Patient needs continued PT services    Follow Up Recommendations  Outpatient PT;Supervision - Intermittent                Equipment Recommendations  Rolling walker with 5" wheels         Frequency Min 4X/week    Precautions / Restrictions Precautions Precautions: Fall Restrictions Weight Bearing Restrictions: No   Pertinent Vitals/Pain VSS, No pain      Mobility  Bed Mobility Bed Mobility: Supine to Sit;Sitting - Scoot to Edge of Bed Supine to Sit: 5: Supervision;HOB elevated Sitting - Scoot to Edge of Bed: 6: Modified independent (Device/Increase time) Transfers Sit to Stand: 4: Min guard;With upper extremity assist;From bed Stand to Sit: 4: Min guard;With upper extremity assist;With armrests;To chair/3-in-1 Ambulation/Gait Ambulation/Gait Assistance: 4: Min assist Ambulation Distance (Feet): 350 Feet Assistive device: None Ambulation/Gait Assistance Details: Pt ambulated without device.  Occasional incr list to left in  which pt appeared to self correct in controlled environment.  Pt states that gait is a little off from PTA.   Gait Pattern: Step-through pattern;Decreased stride length Gait velocity: decreased Stairs: No Wheelchair Mobility Wheelchair Mobility: No Modified Rankin (Stroke Patients Only) Pre-Morbid Rankin Score: No symptoms Modified Rankin: Moderate disability         PT Diagnosis: Generalized weakness  PT Problem List: Decreased activity tolerance;Decreased balance;Decreased mobility;Decreased knowledge of use of DME;Decreased safety awareness PT Treatment Interventions: Gait training;DME instruction;Stair training;Functional mobility training;Therapeutic activities;Balance training;Therapeutic exercise;Patient/family education     PT Goals(Current goals can be found in the care plan section) Acute Rehab PT Goals Patient Stated Goal: Go home tomorrow PT Goal Formulation: With patient Time For Goal Achievement: 12/20/12 Potential to Achieve Goals: Good  Visit Information  Last PT Received On: 12/13/12 Assistance Needed: +1 PT/OT Co-Evaluation/Treatment: Yes History of Present Illness: Craig Castillo is an 79 y.o. male with a past medical history significant for hypertension, dyslipidemia, DM, CAD s/p stenting, MI, CKD, bladder cancer with last procedure done 5/14, brought to [facility] ED by medics due to acute onset right hemiplegia and aphasia.       Prior Functioning  Home Living Family/patient expects to be discharged to:: Private residence Living Arrangements: Spouse/significant other Available Help at Discharge: Available 24 hours/day;Family Type of Home: House Home Access: Stairs to enter [employer] of Steps: 3 Entrance Stairs-Rails: None Home Layout: One level Home Equipment: Cane - single point Prior Function Level of Independence: Independent Communication Communication: No difficulties Dominant Hand: Right    Cognition  Cognition Arousal/Alertness:  Awake/alert Behavior During Therapy: WFL for tasks assessed/performed Overall Cognitive Status: Within Functional Limits for tasks assessed    Extremity/Trunk Assessment Upper Extremity Assessment Upper Extremity Assessment: Defer to OT evaluation Lower Extremity Assessment Lower Extremity Assessment: Generalized weakness Cervical / Trunk Assessment Cervical / Trunk Assessment: Normal   Balance Balance Balance Assessed: Yes Static Standing Balance Static Standing - Balance Support: During functional activity;No upper extremity supported Static Standing - Level of Assistance: 5: Stand by assistance Static Standing - Comment/# of Minutes: 2 High Level Balance High Level Balance Activites: Turns;Direction changes;Head turns High Level Balance Comments: Slight LOB/listing left with min challenges.   End of Session PT - End of Session Equipment Utilized During Treatment: Gait belt Activity Tolerance: Patient tolerated treatment well Patient left: in chair;with call bell/phone within reach Nurse Communication: Mobility status       INGOLD,Jessie Schrieber 12/13/2012, 10:50 AM  [facility] [phone] (pager)

## 2012-12-13 NOTE — Evaluation (Signed)
Occupational Therapy Evaluation Patient Details Name: Craig Castillo MRN: [id number] DOB: 1932/06/15 Today's Date: 12/13/2012 Time: 0950-1009 OT Time Calculation (min): 19 min  OT Assessment / Plan / Recommendation History of present illness Craig Castillo is an 79 y.o. male with a past medical history significant for hypertension, dyslipidemia, DM, CAD s/p stenting, MI, CKD, bladder cancer with last procedure done 5/14, brought to [facility] ED by medics due to acute onset right hemiplegia and aphasia.   Clinical Impression   This 79 yo male admitted with right hemiplegia and aphasia was enrolled in the Prism study (Blind study--TPA v. Asprin) and now presents with mild balance deficits. Will benefit from one more session of acute OT and then will sign off without need for follow up.    OT Assessment  Patient needs continued OT Services    Follow Up Recommendations  No OT follow up       Equipment Recommendations  None recommended by OT       Frequency  Min 2X/week    Precautions / Restrictions Precautions Precautions: Fall Restrictions Weight Bearing Restrictions: No       ADL  Eating/Feeding: Performed;Independent Where Assessed - Eating/Feeding: Bed level Grooming: Simulated;Set up Where Assessed - Grooming: Unsupported sitting Upper Body Bathing: Simulated;Set up Where Assessed - Upper Body Bathing: Unsupported sitting Lower Body Bathing: Simulated;Min guard Where Assessed - Lower Body Bathing: Unsupported sit to stand Upper Body Dressing: Simulated;Set up Where Assessed - Upper Body Dressing: Unsupported sitting Lower Body Dressing: Simulated;Min guard Where Assessed - Lower Body Dressing: Unsupported sit to stand Toilet Transfer: Simulated;Min guard Toilet Transfer Method: Sit to Barista:  (Bed>around unit>recliner') Toileting - Architect and Hygiene: Simulated;Min guard Where Assessed - Engineer, mining and  Hygiene: Sit to stand from 3-in-1 or toilet Equipment Used: Gait belt Transfers/Ambulation Related to ADLs: Min guard A sit<>stand, min A ambulation with no AD    OT Diagnosis: Generalized weakness  OT Problem List: Impaired balance (sitting and/or standing) OT Treatment Interventions: Self-care/ADL training;Balance training;Patient/family education   OT Goals(Current goals can be found in the care plan section) Acute Rehab OT Goals Patient Stated Goal: Go home tomorrow OT Goal Formulation: With patient Time For Goal Achievement: 12/20/12 Potential to Achieve Goals: Good  Visit Information  Last OT Received On: 12/13/12 Assistance Needed: +1 PT/OT Co-Evaluation/Treatment: Yes History of Present Illness: Craig Castillo is an 79 y.o. male with a past medical history significant for hypertension, dyslipidemia, DM, CAD s/p stenting, MI, CKD, bladder cancer with last procedure done 5/14, brought to [facility] ED by medics due to acute onset right hemiplegia and aphasia.       Prior Functioning     Home Living Family/patient expects to be discharged to:: Private residence Living Arrangements: Spouse/significant other Available Help at Discharge: Available 24 hours/day;Family Type of Home: House Home Access: Stairs to enter Secretary/administrator of Steps: 3 Entrance Stairs-Rails: None Home Layout: One level Home Equipment: Cane - single point Prior Function Level of Independence: Independent Communication Communication: No difficulties Dominant Hand: Right         Vision/Perception Vision - History Baseline Vision: Wears glasses only for reading Visual History:  (Bil lens implants) Patient Visual Report: No change from baseline   Cognition  Cognition Arousal/Alertness: Awake/alert Behavior During Therapy: WFL for tasks assessed/performed Overall Cognitive Status: Within Functional Limits for tasks assessed    Extremity/Trunk Assessment Upper Extremity Assessment Upper  Extremity Assessment: Overall WFL for tasks assessed  Mobility Bed Mobility Bed Mobility: Supine to Sit;Sitting - Scoot to Edge of Bed Supine to Sit: 5: Supervision;HOB elevated Sitting - Scoot to Edge of Bed: 6: Modified independent (Device/Increase time) Transfers Transfers: Sit to Stand;Stand to Sit Sit to Stand: 4: Min guard;With upper extremity assist;From bed Stand to Sit: 4: Min guard;With upper extremity assist;With armrests;To chair/3-in-1           End of Session OT - End of Session Equipment Utilized During Treatment: Gait belt Activity Tolerance: Patient tolerated treatment well Patient left: in chair;with call bell/phone within reach       Evette Georges [phone] 12/13/2012, 10:27 AM

## 2012-12-13 NOTE — Progress Notes (Signed)
Pt. Noticed to have increased weakness on the right side as well as increased facial droop. Dr. Thad Ranger notified. No further orders given at this time. Will continue to monitor.

## 2012-12-13 NOTE — Progress Notes (Signed)
Nutrition Brief Note  Patient identified on the Malnutrition Screening Tool (MST) Report for recent weight loss without trying and eating poorly because of a decreased appetite.  Patient has lost weight since May 2014; desirable given obesity.  Wt Readings from Last 10 Encounters:  12/12/12 206 lb 5.6 oz (93.6 kg)  10/06/12 240 lb (108.863 kg)  09/29/12 236 lb 11.2 oz (107.366 kg)  09/29/12 236 lb 11.2 oz (107.366 kg)  09/23/12 236 lb (107.049 kg)  09/14/12 240 lb (108.863 kg)  06/13/12 241 lb (109.317 kg)  03/15/12 240 lb 15.4 oz (109.3 kg)  03/15/12 240 lb 15.4 oz (109.3 kg)  03/08/12 241 lb (109.317 kg)    Body mass index is 32.31 kg/(m^2). Patient meets criteria for Obesity Class I based on current BMI.   Current diet order is Regular.  Patient reports a good appetite and consumed 100% of breakfast this AM.  Labs and medications reviewed.   No nutrition interventions warranted at this time. If nutrition issues arise, please consult RD.   Maureen Chatters, RD, LDN Pager #: [phone] After-Hours Pager #: [phone]

## 2012-12-14 DIAGNOSIS — I639 Cerebral infarction, unspecified: Secondary | ICD-10-CM

## 2012-12-14 DIAGNOSIS — Z79899 Other long term (current) drug therapy: Secondary | ICD-10-CM

## 2012-12-14 LAB — COMPREHENSIVE METABOLIC PANEL
ALT: 6 U/L (ref 0–53)
AST: 10 U/L (ref 0–37)
CO2: 23 mEq/L (ref 19–32)
Calcium: 9.1 mg/dL (ref 8.4–10.5)
Chloride: 107 mEq/L (ref 96–112)
Creatinine, Ser: 2.11 mg/dL — ABNORMAL HIGH (ref 0.50–1.35)
GFR calc Af Amer: 33 mL/min — ABNORMAL LOW (ref >90)
GFR calc non Af Amer: 29 mL/min — ABNORMAL LOW (ref >90)
Glucose, Bld: 124 mg/dL — ABNORMAL HIGH (ref 70–99)
Sodium: 140 mEq/L (ref 135–145)
Total Bilirubin: 0.4 mg/dL (ref 0.3–1.2)

## 2012-12-14 MED ORDER — CEPHALEXIN 250 MG PO CAPS: 250.0000 mg | ORAL_CAPSULE | Freq: Two times a day (BID) | ORAL | Status: DC

## 2012-12-14 MED ORDER — CLOPIDOGREL BISULFATE 75 MG PO TABS: 75.0000 mg | ORAL_TABLET | Freq: Every day | ORAL | Status: DC

## 2012-12-14 NOTE — Progress Notes (Signed)
PT Cancellation Note  Patient Details Name: KEONE KAMER MRN: [id number] DOB: [birth year]   Cancelled Treatment:    Reason Eval/Treat Not Completed: Patient declined, "going home" 12/14/2012  Glen Ellen Bing, PT [phone]  (pager)   Crandall Harvel, Eliseo Gum 12/14/2012, 3:24 PM

## 2012-12-14 NOTE — Evaluation (Signed)
Speech Language Pathology Evaluation Patient Details Name: Craig Castillo MRN: [id number] DOB: August 17, 1932 Today's Date: 12/14/2012 Time: 4098-1191 SLP Time Calculation (min): 25 min  Problem List:  Patient Active Problem List   Diagnosis Date Noted  . Acute ischemic stroke 12/14/2012  . Encounter for long-term (current) use of high-risk medication 12/14/2012  . Malignant hypertension 03/15/2012  . DM (diabetes mellitus), type 2 with renal complications 03/15/2012  . CKD (chronic kidney disease), stage IV 11/20/2011  . Cellulitis of right leg 11/20/2011  . Fever 11/19/2011  . Rigors 11/19/2011  . Obesity 11/19/2011  . CARCINOMA, BLADDER 01/25/2009  . ATHEROSCLEROTIC CARDIOVASCULAR DISEASE 01/25/2009  . NEPHROLITHIASIS, HX OF 01/25/2009  . DYSLIPIDEMIA 01/24/2009  . HYPERTENSION 01/24/2009  . DEGENERATIVE JOINT DISEASE 01/24/2009  . DYSPNEA 01/24/2009   Past Medical History:  Past Medical History  Diagnosis Date  . ASCVD (arteriosclerotic cardiovascular disease)   . Dyspnea   . Dyslipidemia   . DJD (degenerative joint disease)   . Hypertension   . Diabetes mellitus   . Myocardial infarction 1997  . BPH (benign prostatic hyperplasia)   . CKD (chronic kidney disease) stage 3, GFR 30-59 ml/min   . Coronary artery disease   . Bladder cancer    Past Surgical History:  Past Surgical History  Procedure Laterality Date  . Cholecystectomy    . Esophagogastroduodenoscopy  06/08/2002    Soft stricture of the GE  junction with erosive esophagitis. The stricture was dilated to 57 [place] using a Maloney dilator. Swollen fold at GE junction on the gastric site which was biopsied for histology and was suspicious for sentinel folds.  . Colonoscopy  06/08/2002    Small polyp was oblated via cold biopsy from the cecum and two were snared, one was at the rectosigmoid junction measuring about a centimeter, another one at the rectum which was smaller.  . Transurethral resection of bladder  tumor      x7  . Coronary angioplasty  1997    3 stents  . Transurethral resection of bladder tumor  03/15/2012    Procedure: TRANSURETHRAL RESECTION OF BLADDER TUMOR (TURBT);  Surgeon: Ky Barban, MD;  Location: [facility];  Service: Urology;  Laterality: N/A;  . Cataract extraction w/phaco  06/20/2012    Procedure: CATARACT EXTRACTION PHACO AND INTRAOCULAR LENS PLACEMENT (IOC);  Surgeon: Gemma Payor, MD;  Location: [facility];  Service: Ophthalmology;  Laterality: Right;  CDE=16.59  . Cataract extraction w/phaco Left 07/04/2012    Procedure: CATARACT EXTRACTION PHACO AND INTRAOCULAR LENS PLACEMENT (IOC);  Surgeon: Gemma Payor, MD;  Location: [facility];  Service: Ophthalmology;  Laterality: Left;  CDE: 24.33  . Transurethral resection of bladder tumor N/A 09/29/2012    Procedure: TRANSURETHRAL RESECTION OF BLADDER TUMOR (TURBT);  Surgeon: Ky Barban, MD;  Location: [facility];  Service: Urology;  Laterality: N/A;   HPI:  Craig Castillo is an 79 y.o. male with a past medical history significant for hypertension, dyslipidemia, DM, CAD s/p stenting, MI, CKD, bladder cancer with last procedure done 5/14, brought to [facility] ED by medics due to acute onset right hemiplegia and aphasia. CT brain reported a vague area of low attenuation identified at right globus pallidus question acute infarct. Patient participated in the PRISMS trial of IV t-PA versus placebo in patients with minor ischemic strokes which are not disabling. He did have fluctuating neurological deficits with transient worsening for 10 minutes on several occasions yesterday.   Assessment / Plan / Recommendation Clinical Impression  Pt  presents with mildly decreased ability to recall new information, which pt and family (wife, daughter) report is "normal" for him. Pt lives at home with his wife with his daughter next door as well. Pt appears to be at his baseline level of functioning and has family to support him upon his return home. Therefore, pt  does not require further speech services at this time. Pt and family made aware that outpatient SLP services are available should he experience difficulty with more complex tasks upon return home.    SLP Assessment  Patient does not need any further Speech Lanaguage Pathology Services    Follow Up Recommendations  None    Frequency and Duration        Pertinent Vitals/Pain N/A   SLP Goals  SLP Goals Progress/Goals/Alternative treatment plan discussed with pt/caregiver and they: Agree  SLP Evaluation Prior Functioning  Cognitive/Linguistic Baseline: Baseline deficits Baseline deficit details: family reports mild memory problems at baseline, however overall independent Type of Home: House  Lives With: Spouse Available Help at Discharge: Available 24 hours/day;Family   Cognition  Overall Cognitive Status: History of cognitive impairments - at baseline Arousal/Alertness: Awake/alert Orientation Level: Oriented X4 Attention: Selective Selective Attention: Appears intact Memory: Impaired Memory Impairment: Decreased recall of new information Awareness: Appears intact Problem Solving: Appears intact Safety/Judgment: Appears intact    Comprehension  Auditory Comprehension Overall Auditory Comprehension: Appears within functional limits for tasks assessed Visual Recognition/Discrimination Discrimination: Not tested Reading Comprehension Reading Status: Not tested    Expression Expression Primary Mode of Expression: Verbal Verbal Expression Overall Verbal Expression: Appears within functional limits for tasks assessed Written Expression Written Expression: Not tested   Oral / Motor Oral Motor/Sensory Function Overall Oral Motor/Sensory Function: Appears within functional limits for tasks assessed Motor Speech Overall Motor Speech: Appears within functional limits for tasks assessed   GO     Maxcine Ham 12/14/2012, 2:51 PM  Maxcine Ham, M.A. CCC-SLP

## 2012-12-14 NOTE — Progress Notes (Signed)
Stroke Team Progress Note  HISTORY Craig Castillo is an 79 y.o. male with a past medical history significant for hypertension, dyslipidemia, DM, CAD s/p stenting, MI, CKD, bladder cancer with last procedure done 5/14, brought to [facility] ED by medics due to acute onset right hemiplegia and aphasia. Never has similar symptoms before.  He was last known well at 7 am today. Cook breakfast and subsequently developed inability to move the right side and couldn't speak. Denies associated headache, nausea, vomiting, confusion, double vision, chest pain or palpitations.  Upon arrival to ED had NIHSS 2 with improvement to NIHSS1( mild right weakness) after returning from CT.  CT brain reported a vague area of low attenuation identified at right globus pallidus question acute infarct. Patient participated in the PRISMS trial of IV t-PA versus placebo in patients with minor ischemic strokes which are not disabling. He did have fluctuating neurological deficits with transient worsening for 10 minutes on several occasions yesterday.  Date last known well: 12/12/12  Time last known well: 7 AM.  tPA Given: yes  NIHSS: 1   . He was admitted to the neuro ICU for further evaluation and treatment.  SUBJECTIVE  Patient lying in bed. He feels like his symptoms are resolved. No new symptoms.  OBJECTIVE Most recent Vital Signs: Filed Vitals:   12/13/12 1714 12/13/12 2128 12/14/12 0215 12/14/12 0635  BP: 144/76 140/82 136/88 154/75  Pulse: 93 90 87 88  Temp: 98.7 F (37.1 C) 98 F (36.7 C) 98.2 F (36.8 C) 97.5 F (36.4 C)  TempSrc: Oral Oral Oral Oral  Resp: 20 20 20 20   Height:      Weight:      SpO2: 98% 97% 97% 97%   CBG (last 3)   Recent Labs  12/12/12 1918 12/13/12 0746  GLUCAP 82 89    IV Fluid Intake:   . sodium chloride 20 mL/hr at 12/13/12 1500    MEDICATIONS  . cephALEXin  250 mg Oral Q12H  . clopidogrel  75 mg Oral Q breakfast  . ezetimibe  10 mg Oral Daily   PRN:  acetaminophen,  acetaminophen, senna-docusate  Diet:  General thin liquids Activity:  Activity as tolerated DVT Prophylaxis:  SCD  CLINICALLY SIGNIFICANT STUDIES Basic Metabolic Panel:   Recent Labs Lab 12/12/12 0805 12/12/12 0825 12/14/12 0520  NA 138 143 140  K 3.7 3.8 3.8  CL 102 108 107  CO2 24  --  23  GLUCOSE 166* 167* 124*  BUN 26* 27* 27*  CREATININE 2.37* 2.10* 2.11*  CALCIUM 9.5  --  9.1   Liver Function Tests:   Recent Labs Lab 12/12/12 0805 12/14/12 0520  AST 14 10  ALT 7 6  ALKPHOS 77 65  BILITOT 0.6 0.4  PROT 7.4 6.4  ALBUMIN 3.4* 2.8*   CBC:   Recent Labs Lab 12/12/12 0805 12/12/12 0825  WBC 6.0  --   NEUTROABS 4.0  --   HGB 13.6 14.3  HCT 40.4 42.0  MCV 86.5  --   PLT 234  --    Coagulation:   Recent Labs Lab 12/12/12 0805  LABPROT 13.2  INR 1.02   Cardiac Enzymes:   Recent Labs Lab 12/12/12 0805  TROPONINI <0.30   Urinalysis:   Recent Labs Lab 12/13/12 1115  COLORURINE YELLOW  LABSPEC 1.011  PHURINE 6.5  GLUCOSEU NEGATIVE  HGBUR LARGE*  BILIRUBINUR NEGATIVE  KETONESUR NEGATIVE  PROTEINUR 100*  UROBILINOGEN 0.2  NITRITE NEGATIVE  LEUKOCYTESUR LARGE*  Lipid Panel    Component Value Date/Time   CHOL 130 12/13/2012 0510   TRIG 122 12/13/2012 0510   HDL 23* 12/13/2012 0510   CHOLHDL 5.7 12/13/2012 0510   VLDL 24 12/13/2012 0510   LDLCALC 83 12/13/2012 0510   HgbA1C  Lab Results  Component Value Date   HGBA1C 5.6 12/13/2012    Urine Drug Screen:      Component Value Date/Time   LABOPIA NONE DETECTED 12/13/2012 1115    Alcohol Level:   Recent Labs Lab 12/12/12 0805  ETH <11    Ct Head Wo Contrast 12/12/2012   1.  This examination is unchanged compared to the prior study obtained 12/12/2012 at 08:15 a.m., again demonstrating a potential area of age indeterminate ischemia (i.e., potentially a subacute or acute) in the right globus pallidus.  If there is clinical concern for ischemia, further evaluation with brain MRI would  provide additional diagnostic information. 2.  No evidence of acute intracranial hemorrhage in this patient status post t-PA administration. 3.  Mild cerebral and cerebellar atrophy with mild chronic microvascular ischemic changes in the cerebral white matter. 4.  Mild left maxillary sinus mucosal thickening and a small mucosal retention cyst versus polyp.  Ct Head Wo Contrast (2nd) 12/12/2012  Small vessel chronic ischemic changes of deep cerebral white matter. Question acute infarct at right globus pallidus.  Findings called to Dr. Leroy Kennedy on 12/12/2012 at 0836 hours.     CT Head Wo Contrast (3rd) 12/13/2012 No intracranial hemorrhage  Mr Brain Wo Contrast 12/12/2012  Sub-centimeter acute infarction affecting the body of the caudate on the left.  Moderate chronic small vessel changes elsewhere throughout the cerebral hemispheric white matter.     Dg Chest Port 1 View 12/12/2012 Negative exam.    Mr Maxine Glenn Head/brain Wo Cm 12/12/2012 No major vessel occlusion.  Evidence of medium to small vessel atherosclerotic disease with multiple stenoses including the left A1 segment, multiple MCA branches bilaterally and the PCA branches bilaterally.     2D Echocardiogram  EF 55%. No wall motion abnormality  Carotid Doppler  Right: mild to moderate mixed plaque origin and proximal ICA.Left: moderate mixed plaque origin ICA and moderate soft plaque proximal ICA. Bilateral: 0-39% ICA stenosis. Vertebral artery flow is antegrade.  EKG  normal sinus rhythm.   Therapy Recommendations outpatient   Physical Exam   Mental Status:  Alert, oriented, thought content appropriate. Speech fluent without evidence of aphasia. Able to follow 3 step commands without difficulty.  Cranial Nerves:  II: Discs flat bilaterally; Visual fields grossly normal, pupils equal, round, reactive to light and accommodation  III,IV, VI: ptosis not present, extra-ocular motions intact bilaterally  V,VII: smile symmetric, facial light  touch sensation normal bilaterally  VIII: hearing normal bilaterally  IX,X: gag reflex present  XI: bilateral shoulder shrug  XII: midline tongue extension  Motor:  Mild right sided weakness 4/5. With right grip weakness. Diminished fine finger movements on right Tone and bulk:normal tone throughout; no atrophy noted . No drift. Sensory: Pinprick and light touch intact throughout, bilaterally   ASSESSMENT Mr. Craig Castillo is a 79 y.o. male presenting with right hemiparesis, aphasia. Status post IV t-PA/PRISMS Trial at [phone]. Imaging confirms a sub-centimeter acute infarction affecting the body of the caudate on the left.. Infarct felt to be embolic secondary to unknown source.  On no antithrombotics prior to admission. Now on Plavix for secondary stroke prevention. Patient with resultant right hemiparesis/aphasia. Work up underway.   Coronary artery disease  Hyperlipidemia, LDL 83 with goal of LDL < 70 in diabetic patients.  Diabetes mellitus, hgba1c 8.1, goal < 6.5  Abnormal urine, treated with cephalosporin in response to previous organism 09/2012:  Morganella Morganii. Await Culture.  Hospital day # 2  TREATMENT/PLAN  Add clopidogrel 75 mg orally every day for secondary stroke prevention. Patient will not be prescribed aspirin due to allergy to celecoxib in which he has swelling.   Resume zetia  Risk factor modification  Await urine culture; but placed on abx while awaiting identification of organism.  Outpatient therapies   Gwendolyn Lima. Manson Passey, [facility], MBA, MHA [facility] Pager: [phone] 12/14/2012 8:33 AM  I have personally obtained a history, examined the patient, evaluated imaging results, and formulated the assessment and plan of care. I agree with the above.  Delia Heady, MD

## 2012-12-14 NOTE — Progress Notes (Signed)
Occupational Therapy Treatment and Discharge Patient Details Name: Craig Castillo MRN: [id number] DOB: 11-23-1932 Today's Date: 12/14/2012 Time: 1308-6578 OT Time Calculation (min): 10 min  OT Assessment / Plan / Recommendation  History of present illness Craig Castillo is an 79 y.o. male with a past medical history significant for hypertension, dyslipidemia, DM, CAD s/p stenting, MI, CKD, bladder cancer with last procedure done 5/14, brought to [facility] ED by medics due to acute onset right hemiplegia and aphasia.      OT comments  Goal met, D/C from acute OT without need for follow up  Follow Up Recommendations  No OT follow up       Equipment Recommendations  None recommended by OT       Frequency Min 2X/week   Progress towards OT Goals Progress towards OT goals: Goals met/education completed, patient discharged from OT  Plan Discharge plan remains appropriate    Precautions / Restrictions Precautions Precautions: None Restrictions Weight Bearing Restrictions: No       ADL  Lower Body Dressing: Performed;Independent Where Assessed - Lower Body Dressing: Unsupported sit to stand Transfers/Ambulation Related to ADLs: Today pt is able to ambulate around his room and in the hallway independently today.      OT Goals(current goals can now be found in the care plan section)    Visit Information  Last OT Received On: 12/14/12 Assistance Needed: +1 History of Present Illness: Craig Castillo is an 79 y.o. male with a past medical history significant for hypertension, dyslipidemia, DM, CAD s/p stenting, MI, CKD, bladder cancer with last procedure done 5/14, brought to [facility] ED by medics due to acute onset right hemiplegia and aphasia.          Cognition  Cognition Arousal/Alertness: Awake/alert Behavior During Therapy: WFL for tasks assessed/performed Overall Cognitive Status: Within Functional Limits for tasks assessed    Mobility  Bed Mobility Bed Mobility: Supine to  Sit;Sitting - Scoot to Edge of Bed Supine to Sit: 6: Modified independent (Device/Increase time);HOB elevated Sitting - Scoot to Edge of Bed: 7: Independent Transfers Transfers: Sit to Stand;Stand to Sit Sit to Stand: 7: Independent;With upper extremity assist;From bed Stand to Sit: 7: Independent;With upper extremity assist;With armrests;To chair/3-in-1          End of Session OT - End of Session Equipment Utilized During Treatment:  (None) Activity Tolerance: Patient tolerated treatment well Patient left: in chair;with family/visitor present       Evette Georges [phone] 12/14/2012, 1:13 PM

## 2012-12-14 NOTE — Care Management Note (Signed)
    Page 1 of 1   12/14/2012     2:29:08 PM   CARE MANAGEMENT NOTE 12/14/2012  Patient:  Craig Castillo, Craig Castillo   Account Number:  [id number]  Date Initiated:  12/14/2012  Documentation initiated by:  Elmer Bales  Subjective/Objective Assessment:   Pt was admitted for CVA     Action/Plan:   Will follow for discharge needs   Anticipated DC Date:  12/14/2012   Anticipated DC Plan:  HOME/SELF CARE      DC Planning Services  CM consult      Choice offered to / List presented to:             Status of service:  Completed, signed off Medicare Important Message given?   (If response is "NO", the following Medicare IM given date fields will be blank) Date Medicare IM given:   Date Additional Medicare IM given:    Discharge Disposition:  HOME/SELF CARE  Per UR Regulation:  Reviewed for med. necessity/level of care/duration of stay  If discussed at Long Length of Stay Meetings, dates discussed:    Comments:  12/14/12 1420  Elmer Bales RN, MSN, CM- Met with patient and family to discuss outpatient PT/OT.  Pt is agreeable to [facility]. CM spoke with Angelica Chessman at [facility] and patient was given an appointment for Monday, July 28th at 10:15am.  Pt updated on appointment date and time, information added into AVS.

## 2012-12-14 NOTE — Discharge Summary (Signed)
Stroke Discharge Summary  Patient ID: Craig Castillo   MRN: [id number]      DOB: 08-14-1932  Date of Admission: 12/12/2012 Date of Discharge: 12/14/2012  Attending Physician:  Darcella Cheshire, MD, Stroke MD  Consulting Physician(s):   Treatment Team:  Md Stroke, MD None  Patient's PCP:  Cassell Smiles., MD  Discharge Diagnoses:  Principal Problem:   Acute ischemic stroke Active Problems:   DYSLIPIDEMIA   HYPERTENSION   ATHEROSCLEROTIC CARDIOVASCULAR DISEASE   Obesity   CKD (chronic kidney disease), stage IV   DM (diabetes mellitus), type 2 with renal complications   Encounter for long-term (current) use of high-risk medication  BMI: Body mass index is 32.31 kg/(m^2).  Past Medical History  Diagnosis Date  . ASCVD (arteriosclerotic cardiovascular disease)   . Dyspnea   . Dyslipidemia   . DJD (degenerative joint disease)   . Hypertension   . Diabetes mellitus   . Myocardial infarction 1997  . BPH (benign prostatic hyperplasia)   . CKD (chronic kidney disease) stage 3, GFR 30-59 ml/min   . Coronary artery disease   . Bladder cancer    Past Surgical History  Procedure Laterality Date  . Cholecystectomy    . Esophagogastroduodenoscopy  06/08/2002    Soft stricture of the GE  junction with erosive esophagitis. The stricture was dilated to 46 [place] using a Maloney dilator. Swollen fold at GE junction on the gastric site which was biopsied for histology and was suspicious for sentinel folds.  . Colonoscopy  06/08/2002    Small polyp was oblated via cold biopsy from the cecum and two were snared, one was at the rectosigmoid junction measuring about a centimeter, another one at the rectum which was smaller.  . Transurethral resection of bladder tumor      x7  . Coronary angioplasty  1997    3 stents  . Transurethral resection of bladder tumor  03/15/2012    Procedure: TRANSURETHRAL RESECTION OF BLADDER TUMOR (TURBT);  Surgeon: Ky Barban, MD;  Location: [facility];  Service: Urology;  Laterality: N/A;  . Cataract extraction w/phaco  06/20/2012    Procedure: CATARACT EXTRACTION PHACO AND INTRAOCULAR LENS PLACEMENT (IOC);  Surgeon: Gemma Payor, MD;  Location: [facility];  Service: Ophthalmology;  Laterality: Right;  CDE=16.59  . Cataract extraction w/phaco Left 07/04/2012    Procedure: CATARACT EXTRACTION PHACO AND INTRAOCULAR LENS PLACEMENT (IOC);  Surgeon: Gemma Payor, MD;  Location: [facility];  Service: Ophthalmology;  Laterality: Left;  CDE: 24.33  . Transurethral resection of bladder tumor N/A 09/29/2012    Procedure: TRANSURETHRAL RESECTION OF BLADDER TUMOR (TURBT);  Surgeon: Ky Barban, MD;  Location: [facility];  Service: Urology;  Laterality: N/A;      Medication List    STOP taking these medications       amLODipine 10 MG tablet  Commonly known as:  NORVASC      TAKE these medications       cephALEXin 250 MG capsule  Commonly known as:  KEFLEX  Take 1 capsule (250 mg total) by mouth 2 (two) times daily.     clopidogrel 75 MG tablet  Commonly known as:  PLAVIX  Take 1 tablet (75 mg total) by mouth daily with breakfast.     ezetimibe 10 MG tablet  Commonly known as:  ZETIA  Take 10 mg by mouth every morning.     gemfibrozil 600 MG tablet  Commonly known as:  LOPID  Take 600  mg by mouth 2 (two) times daily.     insulin NPH 100 UNIT/ML injection  Commonly known as:  HUMULIN N,NOVOLIN N  Inject 20 Units into the skin 2 (two) times daily.     nitroGLYCERIN 0.4 MG SL tablet  Commonly known as:  NITROSTAT  Place 0.4 mg under the tongue as directed. Chest Pain     omeprazole 20 MG capsule  Commonly known as:  PRILOSEC  Take 20 mg by mouth every morning.        LABORATORY STUDIES CBC    Component Value Date/Time   WBC 6.0 12/12/2012 0805   RBC 4.67 12/12/2012 0805   HGB 14.3 12/12/2012 0825   HCT 42.0 12/12/2012 0825   PLT 234 12/12/2012 0805   MCV 86.5 12/12/2012 0805   MCH 29.1 12/12/2012 0805   MCHC 33.7 12/12/2012 0805   RDW  14.2 12/12/2012 0805   LYMPHSABS 1.3 12/12/2012 0805   MONOABS 0.5 12/12/2012 0805   EOSABS 0.2 12/12/2012 0805   BASOSABS 0.0 12/12/2012 0805   CMP    Component Value Date/Time   NA 140 12/14/2012 0520   K 3.8 12/14/2012 0520   CL 107 12/14/2012 0520   CO2 23 12/14/2012 0520   GLUCOSE 124* 12/14/2012 0520   BUN 27* 12/14/2012 0520   CREATININE 2.11* 12/14/2012 0520   CALCIUM 9.1 12/14/2012 0520   PROT 6.4 12/14/2012 0520   ALBUMIN 2.8* 12/14/2012 0520   AST 10 12/14/2012 0520   ALT 6 12/14/2012 0520   ALKPHOS 65 12/14/2012 0520   BILITOT 0.4 12/14/2012 0520   GFRNONAA 29* 12/14/2012 0520   GFRAA 33* 12/14/2012 0520   COAGS Lab Results  Component Value Date   INR 1.02 12/12/2012   INR 1.24 08/27/2009   INR 0.9 07/20/2008   Lipid Panel    Component Value Date/Time   CHOL 130 12/13/2012 0510   TRIG 122 12/13/2012 0510   HDL 23* 12/13/2012 0510   CHOLHDL 5.7 12/13/2012 0510   VLDL 24 12/13/2012 0510   LDLCALC 83 12/13/2012 0510   HgbA1C  Lab Results  Component Value Date   HGBA1C 5.6 12/13/2012   Cardiac Panel (last 3 results)  Recent Labs  12/12/12 0805  TROPONINI <0.30   Urinalysis    Component Value Date/Time   COLORURINE YELLOW 12/13/2012 1115   APPEARANCEUR TURBID* 12/13/2012 1115   LABSPEC 1.011 12/13/2012 1115   PHURINE 6.5 12/13/2012 1115   GLUCOSEU NEGATIVE 12/13/2012 1115   HGBUR LARGE* 12/13/2012 1115   BILIRUBINUR NEGATIVE 12/13/2012 1115   KETONESUR NEGATIVE 12/13/2012 1115   PROTEINUR 100* 12/13/2012 1115   UROBILINOGEN 0.2 12/13/2012 1115   NITRITE NEGATIVE 12/13/2012 1115   LEUKOCYTESUR LARGE* 12/13/2012 1115   Urine Drug Screen    Component Value Date/Time   LABOPIA NONE DETECTED 12/13/2012 1115   COCAINSCRNUR NONE DETECTED 12/13/2012 1115   LABBENZ NONE DETECTED 12/13/2012 1115   AMPHETMU NONE DETECTED 12/13/2012 1115   THCU NONE DETECTED 12/13/2012 1115   LABBARB NONE DETECTED 12/13/2012 1115    Alcohol Level    Component Value Date/Time   ETH <11 12/12/2012 0805      SIGNIFICANT DIAGNOSTIC STUDIES Ct Head Wo Contrast  12/12/2012 1. This examination is unchanged compared to the prior study obtained 12/12/2012 at 08:15 a.m., again demonstrating a potential area of age indeterminate ischemia (i.e., potentially a subacute or acute) in the right globus pallidus. If there is clinical concern for ischemia, further evaluation with brain MRI would provide additional diagnostic  information. 2. No evidence of acute intracranial hemorrhage in this patient status post t-PA administration. 3. Mild cerebral and cerebellar atrophy with mild chronic microvascular ischemic changes in the cerebral white matter. 4. Mild left maxillary sinus mucosal thickening and a small mucosal retention cyst versus polyp.  Ct Head Wo Contrast (2nd)  12/12/2012 Small vessel chronic ischemic changes of deep cerebral white matter. Question acute infarct at right globus pallidus. Findings called to Dr. Leroy Kennedy on 12/12/2012 at 0836 hours.  CT Head Wo Contrast (3rd)  12/13/2012 No intracranial hemorrhage  Mr Brain Wo Contrast  12/12/2012 Sub-centimeter acute infarction affecting the body of the caudate on the left. Moderate chronic small vessel changes elsewhere throughout the cerebral hemispheric white matter.  Dg Chest Port 1 View  12/12/2012 Negative exam.  Mr Maxine Glenn Head/brain Wo Cm  12/12/2012 No major vessel occlusion. Evidence of medium to small vessel atherosclerotic disease with multiple stenoses including the left A1 segment, multiple MCA branches bilaterally and the PCA branches bilaterally.  2D Echocardiogram EF 55%. No wall motion abnormality  Carotid Doppler Right: mild to moderate mixed plaque origin and proximal ICA.Left: moderate mixed plaque origin ICA and moderate soft plaque proximal ICA. Bilateral: 0-39% ICA stenosis. Vertebral artery flow is antegrade.  EKG normal sinus rhythm.  Therapy Recommendations outpatient   History of Present Illness    Craig Castillo is an 79 y.o.  male with a past medical history significant for hypertension, dyslipidemia, DM, CAD s/p stenting, MI, CKD, bladder cancer with last procedure done 5/14, brought to [facility] ED by medics due to acute onset right hemiplegia and aphasia. Never has similar symptoms before.   He was last known well at 7 am 12/12/2012. Cooked breakfast and subsequently developed inability to move the right side and couldn't speak. Denies associated headache, nausea, vomiting, confusion, double vision, chest pain or palpitations.   Upon arrival to ED had NIHSS 2 with improvement to NIHSS1( mild right weakness) after returning from CT.  CT brain reported a vague area of low attenuation identified at right globus pallidus question acute infarct. Patient participated in the PRISMS trial of IV t-PA versus placebo in patients with minor ischemic strokes which are not disabling. He did have fluctuating neurological deficits with transient worsening for 10 minutes on several occasions yesterday.   Date last known well: 12/12/12  Time last known well: 7 AM.  tPA Given: yes  NIHSS: 1   He was admitted to the neuro ICU for further evaluation and treatment.    Hospital Course   Mr. MITSUGI SCHRADER is a 79 y.o. male presenting with right hemiparesis, aphasia. Status post IV t-PA/PRISMS Trial at [phone]. Imaging confirms a sub-centimeter acute infarction affecting the body of the caudate on the left.. Infarct felt to be embolic secondary to unknown source. On no antithrombotics prior to admission. Now on Plavix for secondary stroke prevention. Patient with resultant right hemiparesis/aphasia. Work up underway.  Coronary artery disease  Hyperlipidemia, LDL 83 with goal of LDL < 70 in diabetic patients, on zetia/lopid Diabetes mellitus, hgba1c 8.1, goal < 6.5  Abnormal urine, treated with cephalosporin in response to previous organism 09/2012: Morganella Morganii. Await Culture. Add clopidogrel 75 mg orally every day for secondary stroke  prevention. Patient will not be prescribed aspirin due to allergy to celecoxib in which he has swelling. Risk factor modification  Outpatient therapies   Patient with vascular risk factors of:   Coronary artery disease  Diabetes mellitus  hyperlipidemia  Patient with continued stroke  symptoms of right hemiparesis. Physical therapy, occupational therapy and speech therapy evaluated patient. They recommend home therapy    Discharge Exam  Blood pressure 143/78, pulse 99, temperature 98.1 F (36.7 C), temperature source Oral, resp. rate 20, height 5\' 7"  (1.702 m), weight 93.6 kg (206 lb 5.6 oz), SpO2 98.00%.   Physical Exam  Mental Status:  Alert, oriented, thought content appropriate. Speech fluent without evidence of aphasia. Able to follow 3 step commands without difficulty.  Cranial Nerves:  II: Discs flat bilaterally; Visual fields grossly normal, pupils equal, round, reactive to light and accommodation  III,IV, VI: ptosis not present, extra-ocular motions intact bilaterally  V,VII: smile symmetric, facial light touch sensation normal bilaterally  VIII: hearing normal bilaterally  IX,X: gag reflex present  XI: bilateral shoulder shrug  XII: midline tongue extension  Motor:  Mild right sided weakness 4/5. With right grip weakness. Diminished fine finger movements on right  Tone and bulk:normal tone throughout; no atrophy noted . No drift.  Sensory: Pinprick and light touch intact throughout, bilaterally     Discharge Diet   General thin liquids  Discharge Plan    Disposition:  home   clopidogrel 75 mg orally every day for secondary stroke prevention.  Ongoing risk factor control by Primary Care Physician. Risk factor recommendations:  Hypertension target range 130-140/70-80 Lipid range - LDL < 100 and checked every 6 months, fasting Diabetes - HgB A1C <7   Follow-up FUSCO,LAWRENCE J., MD in 1 month.  Follow-up with Dr. Delia Heady, Stroke Clinic in 2  months.  40 minutes were spent preparing discharge.  Signed  Gwendolyn Lima. Manson Passey, [facility], MBA, MHA [facility] Pager: [phone] 12/14/2012 12:55 PM  I have personally examined this patient, reviewed pertinent data and developed the plan of care. I agree with above.  Delia Heady, MD

## 2012-12-14 NOTE — Progress Notes (Addendum)
Pt was not on telemetry at beginning of shift change, nurse stated she was not given in report. Order expires at midnight, paged MD on call for new orders. Pt is stable with no complaints. New orders given for telemetry to be started again. Will continue to monitor.

## 2012-12-17 LAB — URINE CULTURE: Colony Count: >=100000

## 2012-12-19 ENCOUNTER — Ambulatory Visit (HOSPITAL_COMMUNITY)
Admit: 2012-12-19 | Discharge: 2012-12-19 | Disposition: A | Discharge status: Home / Self Care | Payer: PRIVATE HEALTH INSURANCE | Source: Ambulatory Visit | Attending: Neurology | Admitting: Neurology

## 2012-12-19 DIAGNOSIS — I10 Essential (primary) hypertension: Secondary | ICD-10-CM | POA: Insufficient documentation

## 2012-12-19 DIAGNOSIS — Z8673 Personal history of transient ischemic attack (TIA), and cerebral infarction without residual deficits: Secondary | ICD-10-CM | POA: Insufficient documentation

## 2012-12-19 DIAGNOSIS — E119 Type 2 diabetes mellitus without complications: Secondary | ICD-10-CM | POA: Insufficient documentation

## 2012-12-19 DIAGNOSIS — IMO0001 Reserved for inherently not codable concepts without codable children: Secondary | ICD-10-CM | POA: Insufficient documentation

## 2012-12-19 NOTE — Progress Notes (Signed)
Physical Therapy Screen/Self Care Patient Details  Name: Craig Castillo MRN: [id number] Date of Birth: 24-Jul-1932  Today's Date: 12/19/2012 Time: 1015-1035 PT Time Calculation (min): 20 min Self Care: [id number] Visit#: 1 of 1  Re-eval:      Authorization:    Authorization Time Period:    Authorization Visit#:   of     Subjective: Symptoms/Limitations Pertinent History: Pt is referred to PT s/p stroke on 12/12/12 while at his home.  He went to [facility] for TPA administration on 12/14/12.  He has been referred to PT for mobility.  At this time pt and wife explain he has returned to his PLOF and does not feel he needs either PT or OT services at this time.  Pain Assessment Currently in Pain?: No/denies  Precautions/Restrictions     Physical Therapy Assessment and Plan PT Assessment and Plan Clinical Impression Statement: Pt is referred to PT s/p stroke on 12/12/12 while at his home.  He went to [facility] for TPA administration on 12/14/12.  He has been referred to PT for mobility.  At this time pt and wife explain he has returned to his PLOF and does not feel he needs either PT or OT services at this time. He has a significant hx of bladder cancer with 19 surgeries and feels he does not need any therapy to improve.  PT Plan: D/C    Goals    Problem List Patient Active Problem List   Diagnosis Date Noted  . Acute ischemic stroke 12/14/2012  . Encounter for long-term (current) use of high-risk medication 12/14/2012  . Malignant hypertension 03/15/2012  . DM (diabetes mellitus), type 2 with renal complications 03/15/2012  . CKD (chronic kidney disease), stage IV 11/20/2011  . Cellulitis of right leg 11/20/2011  . Fever 11/19/2011  . Rigors 11/19/2011  . Obesity 11/19/2011  . CARCINOMA, BLADDER 01/25/2009  . ATHEROSCLEROTIC CARDIOVASCULAR DISEASE 01/25/2009  . NEPHROLITHIASIS, HX OF 01/25/2009  . DYSLIPIDEMIA 01/24/2009  . HYPERTENSION 01/24/2009  . DEGENERATIVE JOINT DISEASE 01/24/2009   . DYSPNEA 01/24/2009    PT Plan of Care PT Patient Instructions: Discussed PLOF.  Did not have any quesitons.  Consulted and Agree with Plan of Care: Patient;Family member/caregiver  GP Functional Assessment Tool Used: pt response Functional Limitation: Mobility: Walking and moving around Mobility: Walking and Moving Around Current Status [phone]): 0 percent impaired, limited or restricted Mobility: Walking and Moving Around Goal Status [phone]): 0 percent impaired, limited or restricted Mobility: Walking and Moving Around Discharge Status [phone]): 0 percent impaired, limited or restricted  Yakov Bergen, MPT, ATC 12/19/2012, 10:45 AM

## 2012-12-28 ENCOUNTER — Other Ambulatory Visit: Payer: Self-pay

## 2013-03-09 ENCOUNTER — Encounter (INDEPENDENT_AMBULATORY_CARE_PROVIDER_SITE_OTHER): Payer: Self-pay

## 2013-03-09 DIAGNOSIS — Z0289 Encounter for other administrative examinations: Secondary | ICD-10-CM

## 2013-03-10 ENCOUNTER — Encounter: Payer: Self-pay | Admitting: Neurology

## 2013-03-30 ENCOUNTER — Other Ambulatory Visit: Payer: Self-pay

## 2013-04-12 ENCOUNTER — Encounter (HOSPITAL_COMMUNITY): Payer: Self-pay | Admitting: Pharmacy Technician

## 2013-04-19 ENCOUNTER — Encounter (HOSPITAL_COMMUNITY)
Admission: RE | Admit: 2013-04-19 | Discharge: 2013-04-19 | Disposition: A | Discharge status: Home / Self Care | Payer: PRIVATE HEALTH INSURANCE | Source: Ambulatory Visit | Attending: Urology | Admitting: Urology

## 2013-04-19 ENCOUNTER — Encounter (HOSPITAL_COMMUNITY): Payer: Self-pay

## 2013-04-19 DIAGNOSIS — Z01812 Encounter for preprocedural laboratory examination: Secondary | ICD-10-CM | POA: Insufficient documentation

## 2013-04-19 DIAGNOSIS — Z01818 Encounter for other preprocedural examination: Secondary | ICD-10-CM | POA: Insufficient documentation

## 2013-04-19 HISTORY — DX: Cerebral infarction, unspecified: I63.9

## 2013-04-19 LAB — HEMOGLOBIN AND HEMATOCRIT, BLOOD: HCT: 35.1 % — ABNORMAL LOW (ref 39.0–52.0)

## 2013-04-19 LAB — BASIC METABOLIC PANEL
Calcium: 9.1 mg/dL (ref 8.4–10.5)
GFR calc Af Amer: 16 mL/min — ABNORMAL LOW (ref >90)
GFR calc non Af Amer: 13 mL/min — ABNORMAL LOW (ref >90)
Potassium: 4.9 mEq/L (ref 3.5–5.1)
Sodium: 139 mEq/L (ref 135–145)

## 2013-04-19 NOTE — Patient Instructions (Signed)
Craig Castillo  04/19/2013   Your procedure is scheduled on:  04/27/13  Report to [facility] at 0900 AM.  Call this number if you have problems the morning of surgery: [phone]   Remember:   Do not eat food or drink liquids after midnight.   Take these medicines the morning of surgery with A SIP OF WATER: norvasc, prilosec, flomax   Do not wear jewelry, make-up or nail polish.  Do not wear lotions, powders, or perfumes. You may wear deodorant.  Do not shave 48 hours prior to surgery. Men may shave face and neck.  Do not bring valuables to the hospital.  [facility] is not responsible                  for any belongings or valuables.               Contacts, dentures or bridgework may not be worn into surgery.  Leave suitcase in the car. After surgery it may be brought to your room.  For patients admitted to the hospital, discharge time is determined by your                treatment team.               Patients discharged the day of surgery will not be allowed to drive  home.  Name and phone number of your driver: family  Special Instructions: Shower using CHG 2 nights before surgery and the night before surgery.  If you shower the day of surgery use CHG.  Use special wash - you have one bottle of CHG for all showers.  You should use approximately 1/3 of the bottle for each shower.   Please read over the following fact sheets that you were given: Pain Booklet, Surgical Site Infection Prevention, Anesthesia Post-op Instructions and Care and Recovery After Surgery   PATIENT INSTRUCTIONS POST-ANESTHESIA  IMMEDIATELY FOLLOWING SURGERY:  Do not drive or operate machinery for the first twenty four hours after surgery.  Do not make any important decisions for twenty four hours after surgery or while taking narcotic pain medications or sedatives.  If you develop intractable nausea and vomiting or a severe headache please notify your doctor immediately.  FOLLOW-UP:  Please make an  appointment with your surgeon as instructed. You do not need to follow up with anesthesia unless specifically instructed to do so.  WOUND CARE INSTRUCTIONS (if applicable):  Keep a dry clean dressing on the anesthesia/puncture wound site if there is drainage.  Once the wound has quit draining you may leave it open to air.  Generally you should leave the bandage intact for twenty four hours unless there is drainage.  If the epidural site drains for more than 36-48 hours please call the anesthesia department.  QUESTIONS?:  Please feel free to call your physician or the hospital operator if you have any questions, and they will be happy to assist you.      Cystoscopy Cystoscopy is a procedure that is used to help your caregiver diagnose and sometimes treat conditions that affect your lower urinary tract. Your lower urinary tract includes your bladder and the tube through which urine passes from your bladder out of your body (urethra). Cystoscopy is performed with a thin, tube-shaped instrument (cystoscope). The cystoscope has lenses and a light at the end so that your caregiver can see inside your bladder. The cystoscope is inserted at the entrance of your urethra. Your caregiver guides it  through your urethra and into your bladder. There are two main types of cystoscopy:  Flexible cystoscopy (with a flexible cystoscope).  Rigid cystoscopy (with a rigid cystoscope). Cystoscopy may be recommended for many conditions, including:  Urinary tract infections.  Blood in your urine (hematuria).  Loss of bladder control (urinary incontinence) or overactive bladder.  Unusual cells found in a urine sample.  Urinary blockage.  Painful urination. Cystoscopy may also be done to remove a sample of your tissue to be checked under a microscope (biopsy). It may also be done to remove or destroy bladder stones. LET YOUR CAREGIVER KNOW ABOUT:  Allergies to food or medicine.  Medicines taken, including  vitamins, herbs, eyedrops, over-the-counter medicines, and creams.  Use of steroids (by mouth or creams).  Previous problems with anesthetics or numbing medicines.  History of bleeding problems or blood clots.  Previous surgery.  Other health problems, including diabetes and kidney problems.  Possibility of pregnancy, if this applies. PROCEDURE The area around the opening to your urethra will be cleaned. A medicine to numb your urethra (local anesthetic) is used. If a tissue sample or stone is removed during the procedure, you may be given a medicine to make you sleep (general anesthetic). Your caregiver will gently insert the tip of the cystoscope into your urethra. The cystoscope will be slowly glided through your urethra and into your bladder. Sterile fluid will flow through the cystoscope and into your bladder. The fluid will expand and stretch your bladder. This gives your caregiver a better view of your bladder walls. The procedure lasts about 15 20 minutes. AFTER THE PROCEDURE If a local anesthetic is used, you will be allowed to go home as soon as you are ready. If a general anesthetic is used, you will be taken to a recovery area until you are stable. You may have temporary bleeding and burning on urination. Document Released: 05/08/2000 Document Revised: 02/03/2012 Document Reviewed: 11/02/2011 [facility] Patient Information 2014 [place], [place].

## 2013-04-27 ENCOUNTER — Encounter (HOSPITAL_COMMUNITY): Payer: PRIVATE HEALTH INSURANCE | Admitting: Anesthesiology

## 2013-04-27 ENCOUNTER — Ambulatory Visit (HOSPITAL_COMMUNITY): Payer: PRIVATE HEALTH INSURANCE | Admitting: Anesthesiology

## 2013-04-27 ENCOUNTER — Observation Stay (HOSPITAL_COMMUNITY)
Admission: RE | Admit: 2013-04-27 | Discharge: 2013-04-28 | Disposition: A | Discharge status: Home / Self Care | Payer: PRIVATE HEALTH INSURANCE | Source: Ambulatory Visit | Attending: Urology | Admitting: Urology

## 2013-04-27 ENCOUNTER — Encounter (HOSPITAL_COMMUNITY): Payer: Self-pay | Admitting: *Deleted

## 2013-04-27 ENCOUNTER — Encounter (HOSPITAL_COMMUNITY)
Admission: RE | Disposition: A | Discharge status: Home / Self Care | Payer: Self-pay | Source: Ambulatory Visit | Attending: Urology

## 2013-04-27 DIAGNOSIS — Z01812 Encounter for preprocedural laboratory examination: Secondary | ICD-10-CM | POA: Insufficient documentation

## 2013-04-27 DIAGNOSIS — C679 Malignant neoplasm of bladder, unspecified: Secondary | ICD-10-CM

## 2013-04-27 DIAGNOSIS — N184 Chronic kidney disease, stage 4 (severe): Secondary | ICD-10-CM | POA: Diagnosis present

## 2013-04-27 DIAGNOSIS — D494 Neoplasm of unspecified behavior of bladder: Principal | ICD-10-CM | POA: Insufficient documentation

## 2013-04-27 DIAGNOSIS — I10 Essential (primary) hypertension: Secondary | ICD-10-CM

## 2013-04-27 DIAGNOSIS — E1129 Type 2 diabetes mellitus with other diabetic kidney complication: Secondary | ICD-10-CM

## 2013-04-27 HISTORY — PX: CYSTOSCOPY WITH BIOPSY: SHX5122

## 2013-04-27 LAB — BASIC METABOLIC PANEL
CO2: 22 mEq/L (ref 19–32)
Calcium: 9.1 mg/dL (ref 8.4–10.5)
Chloride: 106 mEq/L (ref 96–112)
GFR calc Af Amer: 17 mL/min — ABNORMAL LOW (ref >90)
Potassium: 3.8 mEq/L (ref 3.5–5.1)
Sodium: 140 mEq/L (ref 135–145)

## 2013-04-27 LAB — GLUCOSE, CAPILLARY
Glucose-Capillary: 76 mg/dL (ref 70–99)
Glucose-Capillary: 96 mg/dL (ref 70–99)
Glucose-Capillary: 93 mg/dL (ref 70–99)

## 2013-04-27 LAB — CBC
HCT: 31.9 % — ABNORMAL LOW (ref 39.0–52.0)
Hemoglobin: 10.6 g/dL — ABNORMAL LOW (ref 13.0–17.0)
MCH: 29.7 pg (ref 26.0–34.0)
MCV: 89.4 fL (ref 78.0–100.0)
RBC: 3.57 MIL/uL — ABNORMAL LOW (ref 4.22–5.81)
WBC: 7.7 10*3/uL (ref 4.0–10.5)

## 2013-04-27 SURGERY — CYSTOSCOPY, WITH BIOPSY
Anesthesia: Spinal | Site: Bladder

## 2013-04-27 MED ORDER — NITROGLYCERIN 0.4 MG SL SUBL: 0.4000 mg | SUBLINGUAL_TABLET | SUBLINGUAL | Status: DC | PRN

## 2013-04-27 MED ORDER — PROPOFOL 10 MG/ML IV BOLUS
INTRAVENOUS | Status: AC
  Filled 2013-04-27: qty 20

## 2013-04-27 MED ORDER — DEXTROSE 50 % IV SOLN
25.0000 mL | Freq: Once | INTRAVENOUS | Status: AC
  Administered 2013-04-27: 25 mL via INTRAVENOUS

## 2013-04-27 MED ORDER — INSULIN NPH (HUMAN) (ISOPHANE) 100 UNIT/ML ~~LOC~~ SUSP
20.0000 [IU] | Freq: Two times a day (BID) | SUBCUTANEOUS | Status: DC
  Filled 2013-04-27: qty 10

## 2013-04-27 MED ORDER — PROPOFOL INFUSION 10 MG/ML OPTIME
INTRAVENOUS | Status: DC | PRN
  Administered 2013-04-27: 25 ug/kg/min via INTRAVENOUS

## 2013-04-27 MED ORDER — MIDAZOLAM HCL 5 MG/5ML IJ SOLN
INTRAMUSCULAR | Status: DC | PRN
  Administered 2013-04-27: 2 mg via INTRAVENOUS

## 2013-04-27 MED ORDER — EPINEPHRINE HCL 0.1 MG/ML IJ SOSY
PREFILLED_SYRINGE | INTRAMUSCULAR | Status: DC | PRN
  Administered 2013-04-27: 100 ug via INTRAVENOUS

## 2013-04-27 MED ORDER — PANTOPRAZOLE SODIUM 40 MG PO TBEC
40.0000 mg | DELAYED_RELEASE_TABLET | Freq: Every day | ORAL | Status: DC
  Administered 2013-04-28: 40 mg via ORAL
  Filled 2013-04-27: qty 1

## 2013-04-27 MED ORDER — FENTANYL CITRATE 0.05 MG/ML IJ SOLN
INTRAMUSCULAR | Status: AC
  Filled 2013-04-27: qty 2

## 2013-04-27 MED ORDER — MIDAZOLAM HCL 2 MG/2ML IJ SOLN
INTRAMUSCULAR | Status: AC
  Filled 2013-04-27: qty 2

## 2013-04-27 MED ORDER — FENTANYL CITRATE 0.05 MG/ML IJ SOLN: 25.0000 ug | INTRAMUSCULAR | Status: DC | PRN

## 2013-04-27 MED ORDER — STERILE WATER FOR IRRIGATION IR SOLN
Status: DC | PRN
  Administered 2013-04-27: 1000 mL

## 2013-04-27 MED ORDER — MIDAZOLAM HCL 2 MG/2ML IJ SOLN
1.0000 mg | INTRAMUSCULAR | Status: DC | PRN
  Administered 2013-04-27: 2 mg via INTRAVENOUS

## 2013-04-27 MED ORDER — SODIUM CHLORIDE 0.9 % IR SOLN
Status: DC | PRN
  Administered 2013-04-27 (×2): 3000 mL

## 2013-04-27 MED ORDER — SODIUM CHLORIDE 0.45 % IV SOLN
INTRAVENOUS | Status: DC
  Administered 2013-04-27 – 2013-04-28 (×2): via INTRAVENOUS

## 2013-04-27 MED ORDER — LACTATED RINGERS IV SOLN
INTRAVENOUS | Status: DC
  Administered 2013-04-27 (×2): via INTRAVENOUS

## 2013-04-27 MED ORDER — INSULIN NPH (HUMAN) (ISOPHANE) 100 UNIT/ML ~~LOC~~ SUSP
20.0000 [IU] | Freq: Every day | SUBCUTANEOUS | Status: DC
  Administered 2013-04-28: 20 [IU] via SUBCUTANEOUS
  Filled 2013-04-27: qty 10

## 2013-04-27 MED ORDER — INSULIN NPH (HUMAN) (ISOPHANE) 100 UNIT/ML ~~LOC~~ SUSP
SUBCUTANEOUS | Status: AC
  Filled 2013-04-27: qty 10

## 2013-04-27 MED ORDER — DEXTROSE 50 % IV SOLN
INTRAVENOUS | Status: AC
  Filled 2013-04-27: qty 50

## 2013-04-27 MED ORDER — AMLODIPINE BESYLATE 5 MG PO TABS
10.0000 mg | ORAL_TABLET | Freq: Every day | ORAL | Status: DC
Start: 2013-04-27 — End: 2013-04-28
  Administered 2013-04-28: 10 mg via ORAL
  Filled 2013-04-27: qty 2

## 2013-04-27 MED ORDER — BUPIVACAINE IN DEXTROSE 0.75-8.25 % IT SOLN
INTRATHECAL | Status: AC
  Filled 2013-04-27: qty 2

## 2013-04-27 MED ORDER — LIDOCAINE HCL (PF) 1 % IJ SOLN
INTRAMUSCULAR | Status: AC
  Filled 2013-04-27: qty 5

## 2013-04-27 MED ORDER — ONDANSETRON HCL 4 MG/2ML IJ SOLN: 4.0000 mg | Freq: Once | INTRAMUSCULAR | Status: DC | PRN

## 2013-04-27 MED ORDER — DEXTROSE 5 % IV SOLN
INTRAVENOUS | Status: AC
  Filled 2013-04-27: qty 10

## 2013-04-27 MED ORDER — TAMSULOSIN HCL 0.4 MG PO CAPS
0.4000 mg | ORAL_CAPSULE | Freq: Every day | ORAL | Status: DC
  Administered 2013-04-28: 0.4 mg via ORAL
  Filled 2013-04-27: qty 1

## 2013-04-27 MED ORDER — FENTANYL CITRATE 0.05 MG/ML IJ SOLN
25.0000 ug | INTRAMUSCULAR | Status: AC
  Administered 2013-04-27 (×2): 25 ug via INTRAVENOUS

## 2013-04-27 MED ORDER — DEXTROSE 5 % IV SOLN
1.0000 g | INTRAVENOUS | Status: DC
  Administered 2013-04-27 – 2013-04-28 (×2): 1 g via INTRAVENOUS
  Filled 2013-04-27 (×4): qty 10

## 2013-04-27 MED ORDER — EPHEDRINE SULFATE 50 MG/ML IJ SOLN
INTRAMUSCULAR | Status: AC
  Filled 2013-04-27: qty 1

## 2013-04-27 MED ORDER — BUPIVACAINE IN DEXTROSE 0.75-8.25 % IT SOLN
INTRATHECAL | Status: DC | PRN
  Administered 2013-04-27: 15 mg via INTRATHECAL

## 2013-04-27 MED ORDER — DEXTROSE 5 % IV SOLN
1.0000 g | INTRAVENOUS | Status: DC | PRN
  Administered 2013-04-27: 1 g via INTRAVENOUS

## 2013-04-27 MED ORDER — INSULIN ASPART 100 UNIT/ML ~~LOC~~ SOLN: 0.0000 [IU] | Freq: Three times a day (TID) | SUBCUTANEOUS | Status: DC

## 2013-04-27 MED ORDER — GEMFIBROZIL 600 MG PO TABS
600.0000 mg | ORAL_TABLET | Freq: Two times a day (BID) | ORAL | Status: DC
  Administered 2013-04-27 – 2013-04-28 (×3): 600 mg via ORAL
  Filled 2013-04-27 (×3): qty 1

## 2013-04-27 MED ORDER — FENTANYL CITRATE 0.05 MG/ML IJ SOLN
INTRAMUSCULAR | Status: DC | PRN
  Administered 2013-04-27: 25 ug via INTRATHECAL
  Administered 2013-04-27: 25 ug via INTRAVENOUS

## 2013-04-27 MED ORDER — EPHEDRINE SULFATE 50 MG/ML IJ SOLN
INTRAMUSCULAR | Status: DC | PRN
  Administered 2013-04-27: 5 mg via INTRAVENOUS

## 2013-04-27 MED ORDER — INSULIN NPH (HUMAN) (ISOPHANE) 100 UNIT/ML ~~LOC~~ SUSP
15.0000 [IU] | Freq: Every day | SUBCUTANEOUS | Status: DC
  Administered 2013-04-27: 15 [IU] via SUBCUTANEOUS
  Filled 2013-04-27: qty 10

## 2013-04-27 MED ORDER — ASPIRIN EC 325 MG PO TBEC
325.0000 mg | DELAYED_RELEASE_TABLET | Freq: Every day | ORAL | Status: DC
  Administered 2013-04-27 – 2013-04-28 (×2): 325 mg via ORAL
  Filled 2013-04-27 (×2): qty 1

## 2013-04-27 MED ORDER — HYDROMORPHONE HCL PF 1 MG/ML IJ SOLN
1.0000 mg | INTRAMUSCULAR | Status: DC | PRN
  Administered 2013-04-28: 1 mg via INTRAVENOUS
  Filled 2013-04-27: qty 1

## 2013-04-27 MED ORDER — HYDRALAZINE HCL 20 MG/ML IJ SOLN: 10.0000 mg | Freq: Four times a day (QID) | INTRAMUSCULAR | Status: DC | PRN

## 2013-04-27 MED ORDER — FENTANYL CITRATE 0.05 MG/ML IJ SOLN
INTRAMUSCULAR | Status: AC
  Filled 2013-04-27: qty 5

## 2013-04-27 MED ORDER — SODIUM CHLORIDE BACTERIOSTATIC 0.9 % IJ SOLN
INTRAMUSCULAR | Status: AC
  Filled 2013-04-27: qty 10

## 2013-04-27 MED ORDER — EZETIMIBE 10 MG PO TABS
10.0000 mg | ORAL_TABLET | Freq: Every day | ORAL | Status: DC
  Administered 2013-04-27 – 2013-04-28 (×2): 10 mg via ORAL
  Filled 2013-04-27 (×2): qty 1

## 2013-04-27 MED ORDER — GLYCINE 1.5 % IR SOLN
Status: DC | PRN
  Administered 2013-04-27 (×6): 3000 mL

## 2013-04-27 SURGICAL SUPPLY — 28 items
BAG DRAIN URO TABLE W/ADPT NS (DRAPE) ×2 IMPLANT
BAG DRN 8 ADPR NS SKTRN CSTL (DRAPE) ×1
BAG HAMPER (MISCELLANEOUS) ×2 IMPLANT
BAG URINE DRAINAGE (UROLOGICAL SUPPLIES) ×1 IMPLANT
CABLE HI FREQUENCY MONOPOLAR (ELECTROSURGICAL) ×1 IMPLANT
CATH FOLEY 3WAY 30CC 22F (CATHETERS) ×1 IMPLANT
CLOTH BEACON ORANGE TIMEOUT ST (SAFETY) ×2 IMPLANT
ELECT CUT LOOP C-MAX 27FR .012 (CUTTING LOOP) ×2
ELECTRODE BALL 24/28FR 5MM (UROLOGICAL SUPPLIES) ×1 IMPLANT
ELECTRODE CUT LP CMX 27FR .012 (CUTTING LOOP) IMPLANT
ELECTRODE KNIFE URO 27FR PED (UROLOGICAL SUPPLIES) ×1 IMPLANT
FORMALIN 10 PREFIL 120ML (MISCELLANEOUS) ×7 IMPLANT
GLOVE BIO SURGEON STRL SZ7 (GLOVE) ×3 IMPLANT
GLOVE BIOGEL PI IND STRL 7.5 (GLOVE) IMPLANT
GLOVE BIOGEL PI INDICATOR 7.5 (GLOVE) ×2
GLOVE EXAM NITRILE LRG STRL (GLOVE) ×1 IMPLANT
GLYCINE 1.5% IRRIG UROMATIC (IV SOLUTION) ×8 IMPLANT
GOWN STRL REIN XL XLG (GOWN DISPOSABLE) ×2 IMPLANT
IV NS IRRIG 3000ML ARTHROMATIC (IV SOLUTION) ×2 IMPLANT
KIT ROOM TURNOVER AP CYSTO (KITS) ×2 IMPLANT
LOOP BARD CUTTING 24FR (CUTTING LOOP) ×1 IMPLANT
MANIFOLD NEPTUNE II (INSTRUMENTS) ×2 IMPLANT
NEEDLE HYPO 18GX1.5 BLUNT FILL (NEEDLE) ×2 IMPLANT
PACK CYSTO (CUSTOM PROCEDURE TRAY) ×2 IMPLANT
PAD ARMBOARD 7.5X6 YLW CONV (MISCELLANEOUS) ×2 IMPLANT
SET IRRIGATING DISP (SET/KITS/TRAYS/PACK) ×2 IMPLANT
TOWEL OR 17X26 4PK STRL BLUE (TOWEL DISPOSABLE) ×2 IMPLANT
WATER STERILE IRR 1000ML POUR (IV SOLUTION) ×2 IMPLANT

## 2013-04-27 NOTE — Brief Op Note (Signed)
04/27/2013  1:31 PM  PATIENT:  Craig Castillo  79 y.o. male  PRE-OPERATIVE DIAGNOSIS:  bladder tumor  POST-OPERATIVE DIAGNOSIS:  bladder tumor  PROCEDURE:  Procedure(s): CYSTOSCOPY WITH BLADDER BIOPSY & FULGURATION (N/A)  SURGEON:  Surgeon(s) and Role:    * Ky Barban, MD - Primary  PHYSICIAN ASSISTANT:   ASSISTANTS: none   ANESTHESIA:   spinal  EBL:  Total I/O In: 1060 [I.V.:1000; IV Piggyback:60] Out: -   BLOOD ADMINISTERED:none  DRAINS: Urinary Catheter (Foley)   LOCAL MEDICATIONS USED:  NONE  SPECIMEN:  Source of Specimen:  bladder ?tumor  DISPOSITION OF SPECIMEN:  PATHOLOGY  COUNTS:  YES and ACTION TAKEN: None  TOURNIQUET:  * No tourniquets in log *  DICTATION: .Other Dictation: Dictation Number dictation [id number]  PLAN OF CARE: Admit for overnight observation  PATIENT DISPOSITION:  PACU - hemodynamically stable.   Delay start of Pharmacological VTE agent (>24hrs) due to surgical blood loss or risk of bleeding: no

## 2013-04-27 NOTE — Anesthesia Postprocedure Evaluation (Signed)
  Anesthesia Post-op Note  Patient: Craig Castillo  Procedure(s) Performed: Procedure(s): CYSTOSCOPY WITH BLADDER BIOPSY & FULGURATION (N/A)  Patient Location: PACU  Anesthesia Type:Spinal  Level of Consciousness: awake, alert  and oriented  Airway and Oxygen Therapy: Patient Spontanous Breathing  Post-op Pain: none  Post-op Assessment: Post-op Vital signs reviewed, Patient's Cardiovascular Status Stable, Respiratory Function Stable, Patent Airway and No signs of Nausea or vomiting  Post-op Vital Signs:  VSS  Complications: No apparent anesthesia complications

## 2013-04-27 NOTE — H&P (Signed)
NAME:  IDREES, QUAM NO.:  [id number]  MEDICAL RECORD NO.:  [id number]  LOCATION:                                 FACILITY:  PHYSICIAN:  Ky Barban, M.D.DATE OF BIRTH:  1932/07/06  DATE OF ADMISSION:  04/27/2013 DATE OF DISCHARGE:  LH                             HISTORY & PHYSICAL   DIAGNOSES:  Insulin-dependent diabetes and coronary artery disease.  Mr. Treece is a gentleman who is 79 year old, well known to me, has a history of having low-grade bladder cancer, which has been treated multiple times with TURBT and BCG treatment.  He has no recurrences, but lately, he started to have hematuria.  He was cystoscoped in the OR whether it is a recurrence, appears to be a recurrence tumor on the right bladder wall.  I am not sure, so I told him we need to biopsy it and cauterized the area under the anesthesia as outpatient.  He will come and he will stay overnight in the hospital, and I will send him home next day most likely with a catheter.  No fever or chills.  His hematuria has subsided.  He did have urinary tract infection, which was treated with antibiotic.  PAST MEDICAL HISTORY:  He has insulin-dependent diabetes, also history of coronary artery disease, coronary stent placed in 1997.  MEDICATIONS:  He takes on p.r.n. basis nitroglycerin.  FAMILY HISTORY:  Negative.  PERSONAL HISTORY:  Does not smoke or drink.  REVIEW OF SYSTEMS:  Unremarkable.  PHYSICAL EXAMINATION:  GENERAL:  Moderately built male, not in acute distress, fully conscious, alert, oriented. VITAL SIGNS:  Blood pressure 130/80, temperature is normal. CENTRAL NERVOUS SYSTEM:  No gross neurological deficit. HEAD, NECK, EYES, ENT:  Negative. NECK:  Supple.  No lymphadenopathy. CHEST:  Symmetrical.  Normal breath sounds. HEART:  Regular sinus rhythm.  No murmur. ABDOMEN:  Soft, flat.  Liver, spleen, kidneys not palpable.  No CVA tenderness. EXTERNAL GENITALIA:  Normal.   Rectal examination is deferred. EXTREMITIES:  Normal.  IMPRESSION:  Recurrent bladder tumor.  PLAN:  TUR bladder tumor and fulguration under anesthesia as outpatient. Keep him overnight for observation.     Ky Barban, M.D.     MIJ/MEDQ  D:  04/26/2013  T:  04/27/2013  Job:  [id number]

## 2013-04-27 NOTE — Progress Notes (Signed)
No change in H&P on reexamination. 

## 2013-04-27 NOTE — Op Note (Signed)
Note [phone]

## 2013-04-27 NOTE — Anesthesia Preprocedure Evaluation (Signed)
Anesthesia Evaluation  Patient identified by MRN, date of birth, ID band Patient awake    Reviewed: Allergy & Precautions, H&P , NPO status , Patient's Chart, lab work & pertinent test results, reviewed documented beta blocker date and time   History of Anesthesia Complications Negative for: history of anesthetic complications  Airway Mallampati: I TM Distance: >3 FB     Dental  (+) Teeth Intact and Partial Upper   Pulmonary shortness of breath and with exertion, former smoker,  breath sounds clear to auscultation        Cardiovascular hypertension, Pt. on medications and Pt. on home beta blockers - angina+ CAD, + Past MI and + Cardiac Stents Rhythm:Irregular Rate:Normal     Neuro/Psych negative neurological ROS  negative psych ROS   GI/Hepatic Neg liver ROS, GERD-  Medicated and Controlled,  Endo/Other  diabetes, Type 2, Insulin DependentMorbid obesity  Renal/GU Renal InsufficiencyRenal disease     Musculoskeletal  (+) Arthritis -, Osteoarthritis,    Abdominal (+) + obese,  Abdomen: soft.    Peds  Hematology   Anesthesia Other Findings   Reproductive/Obstetrics                           Anesthesia Physical Anesthesia Plan  ASA: III  Anesthesia Plan: Spinal   Post-op Pain Management:    Induction:   Airway Management Planned: Nasal Cannula  Additional Equipment:   Intra-op Plan:   Post-operative Plan:   Informed Consent: I have reviewed the patients History and Physical, chart, labs and discussed the procedure including the risks, benefits and alternatives for the proposed anesthesia with the patient or authorized representative who has indicated his/her understanding and acceptance.     Plan Discussed with:   Anesthesia Plan Comments: (1/2 amp D50W for CBG=76.)        Anesthesia Quick Evaluation

## 2013-04-27 NOTE — Progress Notes (Signed)
[facility] Medical Consultation  Craig Castillo ZOX:[id number] DOB: [birth year] DOA: 04/27/2013 PCP: Cassell Smiles., MD   Requesting physician: Dr. Jerre Simon Date of consultation: 04/27/13 Reason for consultation: medical management  Impression/Recommendations Active Problems:   HYPERTENSION   DM (diabetes mellitus), type 2 with renal complications  hematuria Bladder tumor Chronic kidney disease stage IV   1. Chronic kidney disease stage IV, with possible acute renal failure. Etiology is not entirely clear. We will repeat the basic labs today. Last creatinine in the system is from preoperative labs when it was noted to be 3.9. Continue to monitor urine output. He does not appear to be on any nephrotoxic agents. May benefit from an outpatient nephrology referral. 2. Diabetes with periodic hypoglycemia. Will decrease p.m. NPH to 15 units. Monitor blood sugars during the day to see if morning NPH still needs to be adjusted. 3. Intermittent chest pain. Will obtain an EKG and cycle cardiac markers. These are unremarkable, he will likely need followup with cardiology as an outpatient due to his multiple risk factors and need for repeat cardiac evaluation. 4. Bladder tumor, status post cystoscopy, further management per urology. 5. Hypertension. Continue outpatient meds with amlodipine. We'll use when necessary hydralazine.  I will followup again tomorrow. Please contact me if I can be of assistance in the meanwhile. Thank you for this consultation.  Chief Complaint: Hematuria  HPI:  This is a 79 year old son with history of bladder cancer, closely followed by urology. He has been admitted for a cystoscopy with bladder biopsy and fulguration. Plans are for overnight admission. Since the patient has multiple medical issues, the hospitalist service has been consulted to help manage these issues. The patient has known diabetes, hypertension as well as chronic kidney disease stage IV. He was  initially experiencing hematuria which prompted a repeat cystoscopy. Bladder tumor was found and biopsies as well as fulguration was done.  When asked about his kidney disease, patient did not appear to have much insight into this. In 11/2012, he was noted to have a creatinine of approximately 2. On preoperative lab work, creatinine was noted to be significantly elevated at 3.9. He does not describe any oliguria/anuria. He does not report ever being followed by a nephrologist. He's not had any vomiting, diarrhea. He reports adequate hydration. He's not had any fevers. He does describe occasional chest pain on exertion. He notes this to be in the center of his chest which is worse with exertion and relieved with rest as well as occasionally nitroglycerin. He does not have an associated shortness of breath, nausea, dizziness or any other symptoms. He's been having this intermittently for quite some time now. He has not seen a cardiologist in many years. Regarding his diabetes, he is on NPH insulin twice a day. He does report experiencing episodes of hypoglycemia in the morning with a blood sugar in the 50s. This is the only time he checks his blood sugar during the day but he does not feel symptomatic otherwise during the day.  Review of Systems:  Pertinent positives as her history of present illness, otherwise negative  Past Medical History  Diagnosis Date  . ASCVD (arteriosclerotic cardiovascular disease)   . Dyspnea   . Dyslipidemia   . DJD (degenerative joint disease)   . Hypertension   . Diabetes mellitus   . Myocardial infarction 1997  . BPH (benign prostatic hyperplasia)   . CKD (chronic kidney disease) stage 3, GFR 30-59 ml/min   . Coronary artery disease   .  Bladder cancer   . Stroke    Past Surgical History  Procedure Laterality Date  . Cholecystectomy    . Esophagogastroduodenoscopy  06/08/2002    Soft stricture of the GE  junction with erosive esophagitis. The stricture was dilated  to 95 [place] using a Maloney dilator. Swollen fold at GE junction on the gastric site which was biopsied for histology and was suspicious for sentinel folds.  . Colonoscopy  06/08/2002    Small polyp was oblated via cold biopsy from the cecum and two were snared, one was at the rectosigmoid junction measuring about a centimeter, another one at the rectum which was smaller.  . Transurethral resection of bladder tumor      x7  . Coronary angioplasty  1997    3 stents  . Transurethral resection of bladder tumor  03/15/2012    Procedure: TRANSURETHRAL RESECTION OF BLADDER TUMOR (TURBT);  Surgeon: Ky Barban, MD;  Location: [facility];  Service: Urology;  Laterality: N/A;  . Cataract extraction w/phaco  06/20/2012    Procedure: CATARACT EXTRACTION PHACO AND INTRAOCULAR LENS PLACEMENT (IOC);  Surgeon: Gemma Payor, MD;  Location: [facility];  Service: Ophthalmology;  Laterality: Right;  CDE=16.59  . Cataract extraction w/phaco Left 07/04/2012    Procedure: CATARACT EXTRACTION PHACO AND INTRAOCULAR LENS PLACEMENT (IOC);  Surgeon: Gemma Payor, MD;  Location: [facility];  Service: Ophthalmology;  Laterality: Left;  CDE: 24.33  . Transurethral resection of bladder tumor N/A 09/29/2012    Procedure: TRANSURETHRAL RESECTION OF BLADDER TUMOR (TURBT);  Surgeon: Ky Barban, MD;  Location: [facility];  Service: Urology;  Laterality: N/A;  . Multiple tooth extractions     Social History:  reports that he quit smoking about 32 years ago. His smoking use included Cigarettes. He has a 10 pack-year smoking history. He has never used smokeless tobacco. He reports that he does not drink alcohol or use illicit drugs.  Allergies  Allergen Reactions  . Aspirin     Due to swelling with celecoxib  . Celecoxib Swelling  . Hydrocodone-Acetaminophen Itching  . Morphine Itching  . Niacin Itching  . Piroxicam Itching   Family History  Problem Relation Age of Onset  . Stroke Mother 44  . Diabetes Brother 69    Prior to  Admission medications   Medication Sig Start Date End Date Taking? Authorizing Provider  amLODipine (NORVASC) 10 MG tablet Take 10 mg by mouth daily.   Yes Historical Provider, MD  aspirin 325 MG tablet Take 325 mg by mouth daily.   Yes Historical Provider, MD  ezetimibe (ZETIA) 10 MG tablet Take 10 mg by mouth every morning.    Yes Historical Provider, MD  gemfibrozil (LOPID) 600 MG tablet Take 600 mg by mouth 2 (two) times daily.    Yes Historical Provider, MD  insulin NPH (HUMULIN N,NOVOLIN N) 100 UNIT/ML injection Inject 20 Units into the skin 2 (two) times daily.    Yes Historical Provider, MD  nitroGLYCERIN (NITROSTAT) 0.4 MG SL tablet Place 0.4 mg under the tongue as directed. Chest Pain   Yes Historical Provider, MD  omeprazole (PRILOSEC) 20 MG capsule Take 20 mg by mouth every morning.    Yes Historical Provider, MD  tamsulosin (FLOMAX) 0.4 MG CAPS capsule Take 0.4 mg by mouth daily.   Yes Historical Provider, MD   Physical Exam: Blood pressure 169/76, pulse 83, temperature 98.3 F (36.8 C), temperature source Oral, resp. rate 20, SpO2 96.00%. Filed Vitals:   04/27/13 1746  BP:  169/76  Pulse: 83  Temp: 98.3 F (36.8 C)  Resp: 20     General:  No acute distress, sitting comfortably in bed  Eyes: Pupils are equal, round, react to light  ENT: He does members are moist  Neck: Supple  Cardiovascular: S1, S2, regular rate and rhythm  Respiratory: Clear to auscultation bilaterally  Abdomen: Soft, nontender, positive bowel sounds  Skin: No rashes  Musculoskeletal: Trace edema bilaterally  Psychiatric: Normal affect, cooperative with exam  Neurologic: Grossly intact, nonfocal  Labs on Admission:  Basic Metabolic Panel: No results found for this basename: NA, K, CL, CO2, GLUCOSE, BUN, CREATININE, CALCIUM, MG, PHOS,  in the last 168 hours Liver Function Tests: No results found for this basename: AST, ALT, ALKPHOS, BILITOT, PROT, ALBUMIN,  in the last 168 hours No  results found for this basename: LIPASE, AMYLASE,  in the last 168 hours No results found for this basename: AMMONIA,  in the last 168 hours CBC: No results found for this basename: WBC, NEUTROABS, HGB, HCT, MCV, PLT,  in the last 168 hours Cardiac Enzymes: No results found for this basename: CKTOTAL, CKMB, CKMBINDEX, TROPONINI,  in the last 168 hours BNP: No components found with this basename: POCBNP,  CBG:  Recent Labs Lab 04/27/13 1010 04/27/13 1115 04/27/13 1349 04/27/13 1652  GLUCAP 76 97 96 93    Radiological Exams on Admission: No results found.  EKG: Independently reviewed. No recent EKG, one has been ordered  Time spent:  [name],[name] [facility] Pager [phone]  If 7PM-7AM, please contact night-coverage [email or web address] Password [facility] 04/27/2013, 7:38 PM

## 2013-04-27 NOTE — Transfer of Care (Signed)
Immediate Anesthesia Transfer of Care Note  Patient: Craig Castillo  Procedure(s) Performed: Procedure(s): CYSTOSCOPY WITH BLADDER BIOPSY & FULGURATION (N/A)  Patient Location: PACU  Anesthesia Type:Spinal  Level of Consciousness: awake, alert  and oriented  Airway & Oxygen Therapy: Patient Spontanous Breathing  Post-op Assessment: Report given to PACU RN  Post vital signs: Reviewed  Complications: No apparent anesthesia complications

## 2013-04-27 NOTE — Anesthesia Procedure Notes (Signed)
Spinal  Patient location during procedure: OR Start time: 04/27/2013 12:03 PM Staffing CRNA/Resident: Glynn Octave E Performed by: resident/CRNA  Preanesthetic Checklist Completed: patient identified, site marked, surgical consent, pre-op evaluation, timeout performed, IV checked, risks and benefits discussed and monitors and equipment checked Spinal Block Patient position: left lateral decubitus Prep: Betadine Patient monitoring: heart rate, cardiac monitor, continuous pulse ox and blood pressure Approach: left paramedian Location: L3-4 Injection technique: single-shot Needle Needle type: Spinocan  Needle gauge: 22 G Needle length: 9 cm Assessment Sensory level: T8 Additional Notes  ATTEMPTS:1 TRAY ID [id number] TRAY EXPIRATION DATE 04/2013:

## 2013-04-28 DIAGNOSIS — I10 Essential (primary) hypertension: Secondary | ICD-10-CM

## 2013-04-28 DIAGNOSIS — N184 Chronic kidney disease, stage 4 (severe): Secondary | ICD-10-CM

## 2013-04-28 DIAGNOSIS — N058 Unspecified nephritic syndrome with other morphologic changes: Secondary | ICD-10-CM

## 2013-04-28 DIAGNOSIS — E1129 Type 2 diabetes mellitus with other diabetic kidney complication: Secondary | ICD-10-CM

## 2013-04-28 LAB — TROPONIN I
Troponin I: <0.3 ng/mL (ref <0.30)
Troponin I: <0.3 ng/mL (ref <0.30)

## 2013-04-28 LAB — GLUCOSE, CAPILLARY
Glucose-Capillary: 154 mg/dL — ABNORMAL HIGH (ref 70–99)
Glucose-Capillary: 83 mg/dL (ref 70–99)

## 2013-04-28 LAB — BASIC METABOLIC PANEL
CO2: 19 mEq/L (ref 19–32)
Calcium: 8.9 mg/dL (ref 8.4–10.5)
Chloride: 108 mEq/L (ref 96–112)
Creatinine, Ser: 3.63 mg/dL — ABNORMAL HIGH (ref 0.50–1.35)
GFR calc Af Amer: 17 mL/min — ABNORMAL LOW (ref >90)
GFR calc non Af Amer: 15 mL/min — ABNORMAL LOW (ref >90)
Glucose, Bld: 117 mg/dL — ABNORMAL HIGH (ref 70–99)
Sodium: 140 mEq/L (ref 135–145)

## 2013-04-28 LAB — CBC
Hemoglobin: 10.3 g/dL — ABNORMAL LOW (ref 13.0–17.0)
MCH: 29.7 pg (ref 26.0–34.0)
MCV: 89 fL (ref 78.0–100.0)
Platelets: 223 10*3/uL (ref 150–400)
RBC: 3.47 MIL/uL — ABNORMAL LOW (ref 4.22–5.81)
RDW: 14.2 % (ref 11.5–15.5)
WBC: 7.6 10*3/uL (ref 4.0–10.5)

## 2013-04-28 MED ORDER — METOPROLOL TARTRATE 25 MG PO TABS: 25.0000 mg | ORAL_TABLET | Freq: Two times a day (BID) | ORAL | Status: DC

## 2013-04-28 MED ORDER — INSULIN NPH (HUMAN) (ISOPHANE) 100 UNIT/ML ~~LOC~~ SUSP: 15.0000 [IU] | Freq: Two times a day (BID) | SUBCUTANEOUS | Status: DC

## 2013-04-28 NOTE — Progress Notes (Signed)
UR completed 

## 2013-04-28 NOTE — Progress Notes (Signed)
[facility] PROGRESS NOTE  Craig Castillo WJX:[id number] DOB: April 11, 1933 DOA: 04/27/2013 PCP: Cassell Smiles., MD  Assessment/Plan: Chronic kidney disease stage IV, I suspect that this is progression of his chronic kidney disease. His creatinine appears to be stable at 3.6. He is making adequate urine. His chronic kidney disease likely secondary to diabetes and hypertension. I think he would benefit from outpatient nephrology referral, since he may and upper quadrant dialysis in the future. I have asked the staff to arrange this. Would avoid any nephrotoxic agents.  Diabetes with periodic hypoglycemia. I would recommend to decrease his NPH at night to 15 units. Continue 15 units in the morning. His blood sugars appear to be well controlled.   Intermittent chest pain EKG and cardiac markers were found to be unremarkable. He has not had any further symptoms in the hospital. Considering his risk factors, he would likely benefit from an outpatient cardiology referral for consideration of stress test. Would continue aspirin for now. I have asked the staff to arrange a referral to cardiology.  Bladder tumor, status post cystoscopy, further management per urology.   Hypertension. Continue outpatient meds with amlodipine. I have also added low-dose Lopressor to his outpatient medication list.  Per staff, plans are likely for discharge home later today. From medical standpoint, I think he is stable for discharge. I will sign off at this point. Please call us back if we can be of any further assistance. Thank you for letting us participate in Mr. Craig Castillo care.  HPI/Subjective: No new complaints, no chest pain or shortness of breath  Objective: Filed Vitals:   04/28/13 1435  BP: 149/75  Pulse: 70  Temp: 98.5 F (36.9 C)  Resp: 20    Intake/Output Summary (Last 24 hours) at 04/28/13 1615 Last data filed at 04/28/13 1200  Gross per 24 hour  Intake   3380 ml  Output   4775 ml  Net   -1395 ml   There were no vitals filed for this visit.  Exam:   General:  NAD  Cardiovascular: S1, S2 RRR  Respiratory: CTA B  Abdomen: soft, nt, nd, bs+  Musculoskeletal: no edema b/l   Data Reviewed: Basic Metabolic Panel:  Recent Labs Lab 04/27/13 1819 04/28/13 0202  NA 140 140  K 3.8 3.9  CL 106 108  CO2 22 19  GLUCOSE 191* 117*  BUN 39* 38*  CREATININE 3.68* 3.63*  CALCIUM 9.1 8.9   Liver Function Tests: No results found for this basename: AST, ALT, ALKPHOS, BILITOT, PROT, ALBUMIN,  in the last 168 hours No results found for this basename: LIPASE, AMYLASE,  in the last 168 hours No results found for this basename: AMMONIA,  in the last 168 hours CBC:  Recent Labs Lab 04/27/13 2120 04/28/13 0202  WBC 7.7 7.6  HGB 10.6* 10.3*  HCT 31.9* 30.9*  MCV 89.4 89.0  PLT 205 223   Cardiac Enzymes:  Recent Labs Lab 04/27/13 2120 04/28/13 0202 04/28/13 0757  TROPONINI <0.30 <0.30 <0.30   BNP (last 3 results) No results found for this basename: PROBNP,  in the last 8760 hours CBG:  Recent Labs Lab 04/27/13 1349 04/27/13 1652 04/27/13 2146 04/28/13 0732 04/28/13 1147  GLUCAP 96 93 154* 83 106*    No results found for this or any previous visit (from the past 240 hour(s)).   Studies: No results found.  Scheduled Meds: . amLODipine  10 mg Oral Daily  . aspirin EC  325 mg Oral Daily  .  cefTRIAXone (ROCEPHIN)  IV  1 g Intravenous Q24H  . ezetimibe  10 mg Oral Daily  . gemfibrozil  600 mg Oral BID AC  . insulin aspart  0-9 Units Subcutaneous TID WC  . insulin NPH  15 Units Subcutaneous QHS  . insulin NPH  20 Units Subcutaneous Q breakfast  . pantoprazole  40 mg Oral Daily  . tamsulosin  0.4 mg Oral Daily   Continuous Infusions: . sodium chloride 100 mL/hr at 04/28/13 1408    Active Problems:   HYPERTENSION   CKD (chronic kidney disease), stage IV   DM (diabetes mellitus), type 2 with renal complications Chest pain   Time  spent:68mins    [name],[name]  [facility] Pager [phone]. If 7PM-7AM, please contact night-coverage at [email or web address], password [facility] 04/28/2013, 4:15 PM  LOS: 1 day

## 2013-04-28 NOTE — Op Note (Signed)
NAME:  TIMOTH, SCHARA NO.:  [id number]  MEDICAL RECORD NO.:  [id number]  LOCATION:  A330                          FACILITY:  [facility]  PHYSICIAN:  Ky Barban, M.D.DATE OF BIRTH:  Oct 04, 1932  DATE OF PROCEDURE: DATE OF DISCHARGE:                              OPERATIVE REPORT   Mr. Jablonowski has history of having bladder tumor.  He has multiple TURBTs in the past and also BCG treatment.  He has developed incontinence.  His bladder is very small in capacity and I proceeded to look into the bladder, with the #28 [facility] resectoscope, I can see he has very suspicious area on the right bladder wall around the orifice. I biopsied it with the cold biopsy forceps and then I simply fulgurated with the roller electrode, then I could not see the orifice.  The whole area was fulgurated, extending slightly to the left side also.  On the anterior bladder wall, I do not see any Suren tumor, but there were some suspicious areas, which were simply fulgurated.  After this, there was no bleeding going on.  I removed the resectoscope and chips were evacuated with the help of the [facility] evacuator.  Then, I inserted #22 three-way Foley catheter.  The CBI is clear.  The patient left the operating room in satisfactory condition.     Ky Barban, M.D.     MIJ/MEDQ  D:  04/27/2013  T:  04/28/2013  Job:  [id number]

## 2013-04-28 NOTE — Progress Notes (Signed)
General status good urine clear appreciate consult from dr Ma Hillock discharge home with foley catheter iwill see him Monday at 4 pm to dc foley catheter pathology still pending.

## 2013-04-28 NOTE — Anesthesia Postprocedure Evaluation (Signed)
  Anesthesia Post-op Note  Patient: Craig Castillo  Procedure(s) Performed: Procedure(s): CYSTOSCOPY WITH BLADDER BIOPSY & FULGURATION (N/A)  Patient Location: room 330  Anesthesia Type:Spinal  Level of Consciousness: awake, alert , oriented and patient cooperative  Airway and Oxygen Therapy: Patient Spontanous Breathing  Post-op Pain: 3 /10, mild  Post-op Assessment: Post-op Vital signs reviewed, Patient's Cardiovascular Status Stable, Respiratory Function Stable, Patent Airway, No signs of Nausea or vomiting, Adequate PO intake and Pain level controlled  Post-op Vital Signs: Reviewed and stable  Complications: No apparent anesthesia complications

## 2013-04-28 NOTE — Progress Notes (Signed)
Pt's IV removed.  Site WNL.  AVS reviewed with patient.  Patient verbalized understanding of d/c instructions, medications and physician follow-up (nephrology, cardiology and urology).  Patient being discharged with foley catheter in place until follow-up with Dr. Jerre Simon on Monday.

## 2013-05-01 ENCOUNTER — Other Ambulatory Visit (HOSPITAL_COMMUNITY): Payer: Self-pay | Admitting: Urology

## 2013-05-01 ENCOUNTER — Encounter (HOSPITAL_COMMUNITY): Payer: Self-pay | Admitting: Urology

## 2013-05-01 DIAGNOSIS — C679 Malignant neoplasm of bladder, unspecified: Secondary | ICD-10-CM

## 2013-05-01 DIAGNOSIS — N289 Disorder of kidney and ureter, unspecified: Secondary | ICD-10-CM

## 2013-05-02 ENCOUNTER — Ambulatory Visit (HOSPITAL_COMMUNITY)
Admission: RE | Admit: 2013-05-02 | Discharge: 2013-05-02 | Disposition: A | Discharge status: Home / Self Care | Payer: PRIVATE HEALTH INSURANCE | Source: Ambulatory Visit | Attending: Urology | Admitting: Urology

## 2013-05-02 DIAGNOSIS — N1330 Unspecified hydronephrosis: Secondary | ICD-10-CM | POA: Insufficient documentation

## 2013-05-02 DIAGNOSIS — C679 Malignant neoplasm of bladder, unspecified: Secondary | ICD-10-CM

## 2013-05-02 DIAGNOSIS — Z8551 Personal history of malignant neoplasm of bladder: Secondary | ICD-10-CM | POA: Insufficient documentation

## 2013-05-02 DIAGNOSIS — N289 Disorder of kidney and ureter, unspecified: Secondary | ICD-10-CM | POA: Insufficient documentation

## 2013-05-05 ENCOUNTER — Encounter: Payer: Self-pay | Admitting: Cardiology

## 2013-05-05 ENCOUNTER — Ambulatory Visit (INDEPENDENT_AMBULATORY_CARE_PROVIDER_SITE_OTHER): Payer: PRIVATE HEALTH INSURANCE | Admitting: Cardiology

## 2013-05-05 VITALS — BP 143/76 | HR 86 | Ht 68.0 in | Wt 209.8 lb

## 2013-05-05 DIAGNOSIS — E785 Hyperlipidemia, unspecified: Secondary | ICD-10-CM

## 2013-05-05 DIAGNOSIS — R079 Chest pain, unspecified: Secondary | ICD-10-CM

## 2013-05-05 DIAGNOSIS — I10 Essential (primary) hypertension: Secondary | ICD-10-CM

## 2013-05-05 MED ORDER — METOPROLOL TARTRATE 50 MG PO TABS: 50.0000 mg | ORAL_TABLET | Freq: Two times a day (BID) | ORAL | Status: DC

## 2013-05-05 MED ORDER — NITROGLYCERIN 0.4 MG SL SUBL: 0.4000 mg | SUBLINGUAL_TABLET | SUBLINGUAL | Status: DC

## 2013-05-05 NOTE — Progress Notes (Signed)
Clinical Summary Craig Castillo is a 79 y.o.male seen today as a new patient.   1. Chest pain/CAD - recent admission to [facility], negative EKG and cardiac enzymes - history of prior cardiac stents 1997.  - 1 month of symptoms. Aching pain in mid chest 10/10. + palpitations. No SOB no diaphoresis. Reports 4-5 episodes over the last month. Typically occurs with exertion. Lasts approx 10 minutes, better with NG.   2. CKD stage IV - followed closely, currently not on dialysis.   3. HTN - does not check regularly - compliant with meds  4. DM - followed by Dr Sherwood Gambler  5. Bladder tumor - followed by urology, continues to have hematuria.  6. Hyperlipidemia - on gemfibrozil and zetia, followed Dr Sherwood Gambler. - his statin history of is unclear   Past Medical History  Diagnosis Date  . ASCVD (arteriosclerotic cardiovascular disease)   . Dyspnea   . Dyslipidemia   . DJD (degenerative joint disease)   . Hypertension   . Diabetes mellitus   . Myocardial infarction 1997  . BPH (benign prostatic hyperplasia)   . CKD (chronic kidney disease) stage 3, GFR 30-59 ml/min   . Coronary artery disease   . Bladder cancer   . Stroke      Allergies  Allergen Reactions  . Aspirin     Due to swelling with celecoxib  . Celecoxib Swelling  . Hydrocodone-Acetaminophen Itching  . Morphine Itching  . Niacin Itching  . Piroxicam Itching     Current Outpatient Prescriptions  Medication Sig Dispense Refill  . amLODipine (NORVASC) 10 MG tablet Take 10 mg by mouth daily.      Marland Kitchen aspirin 325 MG tablet Take 325 mg by mouth daily.      Marland Kitchen ezetimibe (ZETIA) 10 MG tablet Take 10 mg by mouth every morning.       Marland Kitchen gemfibrozil (LOPID) 600 MG tablet Take 600 mg by mouth 2 (two) times daily.       . insulin NPH (HUMULIN N,NOVOLIN N) 100 UNIT/ML injection Inject 15-20 Units into the skin 2 (two) times daily. Take 20 units SQ QAM and 15 units SQ QPM  1 vial  12  . metoprolol tartrate (LOPRESSOR) 25 MG  tablet Take 1 tablet (25 mg total) by mouth 2 (two) times daily.  60 tablet  1  . nitroGLYCERIN (NITROSTAT) 0.4 MG SL tablet Place 0.4 mg under the tongue as directed. Chest Pain      . omeprazole (PRILOSEC) 20 MG capsule Take 20 mg by mouth every morning.       . tamsulosin (FLOMAX) 0.4 MG CAPS capsule Take 0.4 mg by mouth daily.       No current facility-administered medications for this visit.     Past Surgical History  Procedure Laterality Date  . Cholecystectomy    . Esophagogastroduodenoscopy  06/08/2002    Soft stricture of the GE  junction with erosive esophagitis. The stricture was dilated to 25 [place] using a Maloney dilator. Swollen fold at GE junction on the gastric site which was biopsied for histology and was suspicious for sentinel folds.  . Colonoscopy  06/08/2002    Small polyp was oblated via cold biopsy from the cecum and two were snared, one was at the rectosigmoid junction measuring about a centimeter, another one at the rectum which was smaller.  . Transurethral resection of bladder tumor      x7  . Coronary angioplasty  1997    3  stents  . Transurethral resection of bladder tumor  03/15/2012    Procedure: TRANSURETHRAL RESECTION OF BLADDER TUMOR (TURBT);  Surgeon: Ky Barban, MD;  Location: [facility];  Service: Urology;  Laterality: N/A;  . Cataract extraction w/phaco  06/20/2012    Procedure: CATARACT EXTRACTION PHACO AND INTRAOCULAR LENS PLACEMENT (IOC);  Surgeon: Gemma Payor, MD;  Location: [facility];  Service: Ophthalmology;  Laterality: Right;  CDE=16.59  . Cataract extraction w/phaco Left 07/04/2012    Procedure: CATARACT EXTRACTION PHACO AND INTRAOCULAR LENS PLACEMENT (IOC);  Surgeon: Gemma Payor, MD;  Location: [facility];  Service: Ophthalmology;  Laterality: Left;  CDE: 24.33  . Transurethral resection of bladder tumor N/A 09/29/2012    Procedure: TRANSURETHRAL RESECTION OF BLADDER TUMOR (TURBT);  Surgeon: Ky Barban, MD;  Location: [facility];  Service: Urology;   Laterality: N/A;  . Multiple tooth extractions    . Cystoscopy with biopsy N/A 04/27/2013    Procedure: CYSTOSCOPY WITH BLADDER BIOPSY & FULGURATION;  Surgeon: Ky Barban, MD;  Location: [facility];  Service: Urology;  Laterality: N/A;  Hx of CVA   Allergies  Allergen Reactions  . Aspirin     Due to swelling with celecoxib  . Celecoxib Swelling  . Hydrocodone-Acetaminophen Itching  . Morphine Itching  . Niacin Itching  . Piroxicam Itching      Family History  Problem Relation Age of Onset  . Stroke Mother 67  . Diabetes Brother 32     Social History Craig Castillo reports that he quit smoking about 32 years ago. His smoking use included Cigarettes. He has a 10 pack-year smoking history. He has never used smokeless tobacco. Craig Castillo reports that he does not drink alcohol.   Review of Systems CONSTITUTIONAL: No weight loss, fever, chills, weakness or fatigue.  HEENT: Eyes: No visual loss, blurred vision, double vision or yellow sclerae.No hearing loss, sneezing, congestion, runny nose or sore throat.  SKIN: No rash or itching.  CARDIOVASCULAR: per HPI RESPIRATORY: her HPI GASTROINTESTINAL: No anorexia, nausea, vomiting or diarrhea. No abdominal pain or blood.  GENITOURINARY: hematuria NEUROLOGICAL: No headache, dizziness, syncope, paralysis, ataxia, numbness or tingling in the extremities. No change in bowel or bladder control.  MUSCULOSKELETAL: No muscle, back pain, joint pain or stiffness.  LYMPHATICS: No enlarged nodes. No history of splenectomy.  PSYCHIATRIC: No history of depression or anxiety.  ENDOCRINOLOGIC: No reports of sweating, cold or heat intolerance. No polyuria or polydipsia.  Marland Kitchen   Physical Examination p 86 bp 143/76 Wt 209 lbs BMI 32 Gen: resting comfortably, no acute distress HEENT: no scleral icterus, pupils equal round and reactive, no palptable cervical adenopathy,  CV: RRR, no m/r/g,no JVD, no carotid bruits Resp: Clear to auscultation  bilaterally GI: abdomen is soft, non-tender, non-distended, normal bowel sounds, no hepatosplenomegaly MSK: extremities are warm, no edema.  Skin: warm, no rash Neuro:  no focal deficits Psych: appropriate affect   Diagnostic Studies 11/2012 Echo: LVEF 55-60%, mild LVH, no WMAs,   11/2012 Carotid US:  Summary: Right: mild to moderate mixed plaque origin and proximal ICA.Left: moderate mixed plaque origin ICA and moderate soft plaque proximal ICA. Bilateral: 0-39% ICA stenosis. Vertebral artery flow is antegrade.  05/01/13 EKG  Sinus rhythm, incomplete RBBB Assessment and Plan  1. Chest pain - history of prior CAD, multiple risk factors. Symptoms suggestive of possible angina. - will obtain lexiscan. Patient cannot run due to chronic pelvic pain and hematuria - will increase metoprolol to 50mg  bid to increase his antianginal  therapy - decrease ASA to 81mg  daily for secondary prevention - pending stress results, if significant ischemia further management will be difficult due to advanced CKD as well as persistent hematuria with a bladder tumor  2. HTN - follow blood pressure after increased metoprolol, given his DM history is bp is not at goal <130/80  3. Hyperlipidemia - unclear statin history, he is on zetia and gemfibrozil so I presume he had some form of adverse reaction to statin previously. I do not have these records, this has been followed by his PCP Fusco. Defer management to PCP, statins would be first line for him unless clear contraindication.    F/u 1 month   Antoine Poche, M.D., F.A.C.C.

## 2013-05-05 NOTE — Patient Instructions (Signed)
Your physician recommends that you schedule a follow-up appointment in: 1 month with Dr. Wyline Mood. This appointment will be scheduled today before you leave.  Your physician has recommended you make the following change in your medication:  Increase Metoprolol (Lopressor) 50 MG twice daily Decrease Aspirin 81 MG once daily  Continue all other medications the same.   Your physician has requested that you have a lexiscan myoview. For further information please visit [email or web address] Please follow instruction sheet, as given.

## 2013-05-10 ENCOUNTER — Other Ambulatory Visit: Payer: Self-pay | Admitting: *Deleted

## 2013-05-10 DIAGNOSIS — N186 End stage renal disease: Secondary | ICD-10-CM

## 2013-05-10 DIAGNOSIS — Z01810 Encounter for preprocedural cardiovascular examination: Secondary | ICD-10-CM

## 2013-05-22 NOTE — Addendum Note (Signed)
Addended by: Thompson Grayer on: 05/22/2013 12:31 PM   Modules accepted: Orders

## 2013-05-23 ENCOUNTER — Encounter (HOSPITAL_COMMUNITY): Payer: Self-pay

## 2013-05-23 ENCOUNTER — Encounter (HOSPITAL_COMMUNITY)
Admission: RE | Admit: 2013-05-23 | Discharge: 2013-05-23 | Disposition: A | Discharge status: Home / Self Care | Payer: PRIVATE HEALTH INSURANCE | Source: Ambulatory Visit | Attending: Cardiology | Admitting: Cardiology

## 2013-05-23 DIAGNOSIS — N289 Disorder of kidney and ureter, unspecified: Secondary | ICD-10-CM | POA: Insufficient documentation

## 2013-05-23 DIAGNOSIS — R079 Chest pain, unspecified: Secondary | ICD-10-CM | POA: Diagnosis present

## 2013-05-23 DIAGNOSIS — E119 Type 2 diabetes mellitus without complications: Secondary | ICD-10-CM | POA: Diagnosis not present

## 2013-05-23 DIAGNOSIS — E785 Hyperlipidemia, unspecified: Secondary | ICD-10-CM | POA: Diagnosis not present

## 2013-05-23 DIAGNOSIS — I2510 Atherosclerotic heart disease of native coronary artery without angina pectoris: Secondary | ICD-10-CM | POA: Insufficient documentation

## 2013-05-23 DIAGNOSIS — I10 Essential (primary) hypertension: Secondary | ICD-10-CM | POA: Insufficient documentation

## 2013-05-23 MED ORDER — SODIUM CHLORIDE 0.9 % IJ SOLN
INTRAMUSCULAR | Status: AC
  Administered 2013-05-23: 10 mL via INTRAVENOUS
  Filled 2013-05-23: qty 10

## 2013-05-23 MED ORDER — TECHNETIUM TC 99M SESTAMIBI - CARDIOLITE
30.0000 | Freq: Once | INTRAVENOUS | Status: AC | PRN
  Administered 2013-05-23: 30 via INTRAVENOUS

## 2013-05-23 MED ORDER — REGADENOSON 0.4 MG/5ML IV SOLN
INTRAVENOUS | Status: AC
  Administered 2013-05-23: 0.4 mg via INTRAVENOUS
  Filled 2013-05-23: qty 5

## 2013-05-23 MED ORDER — TECHNETIUM TC 99M SESTAMIBI GENERIC - CARDIOLITE
10.0000 | Freq: Once | INTRAVENOUS | Status: AC | PRN
  Administered 2013-05-23: 10 via INTRAVENOUS

## 2013-05-23 NOTE — Progress Notes (Signed)
Stress Lab Nurses Notes - [name]  Craig Castillo 05/23/2013 Reason for doing test: CAD and Chest Pain Type of test: Marlane Hatcher Nurse performing test: Parke Poisson, RN Nuclear Medicine Tech: Lyndel Pleasure Echo Tech: Not Applicable MD performing test: S. McDowell/ K.Lawrence NP Family MD: Dr. Sherwood Gambler Test explained and consent signed: yes IV started: 22g jelco, Saline lock flushed, No redness or edema and Saline lock started in radiology Symptoms: SOB Treatment/Intervention: None Reason test stopped: protocol completed After recovery IV was: Discontinued via X-ray tech and No redness or edema Patient to return to Nuc. Med at : 12:15 Patient discharged: Home Patient's Condition upon discharge was: stable Comments: During test BP 136/56 & HR 75. Recovery BP 123/68 & HR 69.  Symptoms resolved in recovery. Erskine Speed T

## 2013-05-26 ENCOUNTER — Telehealth: Payer: Self-pay | Admitting: Cardiology

## 2013-05-26 NOTE — Telephone Encounter (Signed)
Message copied by Lewayne Bunting on Fri May 26, 2013  4:04 PM ------      Message from: Amery F      Created: Fri May 26, 2013 11:47 AM       Please let patient know that is stress test was abnormal, there is evidence that part of his heart is not getting enough blood. He is high risk of kidney failure if we were to do a cardiac catheterization. Please found out how his symptoms of chest pain are doing and message me back. For now keep appointment scheduled for Jan 13            Carlyle Dolly MD ------

## 2013-05-26 NOTE — Telephone Encounter (Signed)
Called pt informed of results and pt verbalized understanding. Pt states that chest pain is about the same it is not any better and it is not any worse. No shortness of breath.

## 2013-05-29 NOTE — Discharge Summary (Signed)
Dictated [id number]

## 2013-05-31 NOTE — Discharge Summary (Signed)
NAME:  Craig Castillo, Craig Castillo NO.:  [id number]  MEDICAL RECORD NO.:  [id number]  LOCATION:                                 FACILITY:  PHYSICIAN:  Marissa Nestle, M.D.DATE OF BIRTH:  05/02/1933  DATE OF ADMISSION:  04/27/2013 DATE OF DISCHARGE:  12/05/2014LH                              DISCHARGE SUMMARY   A 80 year old gentleman, who is well known to me, has a longstanding history of having low-grade bladder cancer, which has been treated multiple times with TURBT and BCG treatment.  He has no recurrence, but recently started having gross hematuria.  I scoped him in the office, there is a questionable recurrence of the tumor in the bladder, right bladder wall.  So, I decided to resect this and biopsy it.  He was brought as outpatient, he has other multiple medical problem.  He has insulin-dependent diabetes, coronary artery disease, hypertension. Routine preadmission workup was done, which is normal.  He was taken to the operating room.  I looked into the bladder, the area on the right bladder wall was biopsied and fulgurated.  He has a very small capacity bladder.  Subsequently, his pathology report came back as negative for bladder cancer.  Next day, his urine was clear, so I discharged him home with Foley catheter, which I will take it out in the office. Subsequently, his pathology report came back in and I told him that there is no cancer in that biopsy.  So we will keep taking care of symptomatically.     Marissa Nestle, M.D.     MIJ/MEDQ  D:  05/29/2013  T:  05/30/2013  Job:  [id number]

## 2013-06-06 ENCOUNTER — Encounter: Payer: Self-pay | Admitting: Cardiology

## 2013-06-06 ENCOUNTER — Ambulatory Visit (INDEPENDENT_AMBULATORY_CARE_PROVIDER_SITE_OTHER): Payer: PRIVATE HEALTH INSURANCE | Admitting: Cardiology

## 2013-06-06 VITALS — BP 175/48 | HR 64 | Ht 68.0 in | Wt 221.0 lb

## 2013-06-06 DIAGNOSIS — R079 Chest pain, unspecified: Secondary | ICD-10-CM

## 2013-06-06 DIAGNOSIS — I2510 Atherosclerotic heart disease of native coronary artery without angina pectoris: Secondary | ICD-10-CM

## 2013-06-06 DIAGNOSIS — E785 Hyperlipidemia, unspecified: Secondary | ICD-10-CM

## 2013-06-06 MED ORDER — ISOSORBIDE MONONITRATE ER 30 MG PO TB24: 30.0000 mg | ORAL_TABLET | Freq: Every day | ORAL | Status: DC

## 2013-06-06 NOTE — Progress Notes (Signed)
Clinical Summary Mr. Sabath is a 80 y.o.male seen today for follow up of the following medical problems.  1. Chest pain/CAD  - recent admission to [facility], negative EKG and cardiac enzymes  - history of prior cardiac stents 1997. - since last visit has had some episodes of chest pain. Occurs every fews day. Aching pain all over chest, 8/10. Lasts up to 10 minutes, better with NG. No other associated symptoms. Stable in severity, no significant change in frequency. Last visit increased his metoprolol without any significant change in symptoms.  - since last visit he completed a Lexiscan MPI that showed moderate area of ischemia in the inferolateral wall, overall intermediate risk.  2. CKD stage IV  - followed closely by Dr Dixon Boos, patient reports he is being considered for dialysis and has been referred to vascular surgery in [facility] for access consideration   3. HTN  - does not check regularly  - compliant with meds   4. DM  - followed by Dr Gerarda Fraction   5. Bladder tumor  - followed by urology, hematuria has resolved. Reports multiple prior surgeries, with most recent Dec 2014. Per notes scope showed suspicious area on right bladder that was biopsided and fulgurated. He has not had any hematuria since.   6. Hyperlipidemia  - on gemfibrozil and zetia, followed Dr Gerarda Fraction.  - his statin history is unclear, appears he was on zocor a few years ago but from our records I cannot determine why it was stopped. The patient does not recall either.    Past Medical History  Diagnosis Date  . ASCVD (arteriosclerotic cardiovascular disease)   . Dyspnea   . Dyslipidemia   . DJD (degenerative joint disease)   . Hypertension   . Diabetes mellitus   . Myocardial infarction 1997  . BPH (benign prostatic hyperplasia)   . CKD (chronic kidney disease) stage 3, GFR 30-59 ml/min   . Coronary artery disease   . Bladder cancer   . Stroke      Allergies  Allergen Reactions  . Aspirin       Due to swelling with celecoxib  . Celecoxib Swelling  . Hydrocodone-Acetaminophen Itching  . Morphine Itching  . Niacin Itching  . Piroxicam Itching     Current Outpatient Prescriptions  Medication Sig Dispense Refill  . amLODipine (NORVASC) 10 MG tablet Take 10 mg by mouth daily.      Marland Kitchen aspirin 81 MG tablet Take 81 mg by mouth daily.      Marland Kitchen ezetimibe (ZETIA) 10 MG tablet Take 10 mg by mouth every morning.       Marland Kitchen gemfibrozil (LOPID) 600 MG tablet Take 600 mg by mouth 2 (two) times daily.       . insulin NPH (HUMULIN N,NOVOLIN N) 100 UNIT/ML injection Inject 15-20 Units into the skin 2 (two) times daily. Take 20 units SQ QAM and 15 units SQ QPM  1 vial  12  . metoprolol tartrate (LOPRESSOR) 50 MG tablet Take 1 tablet (50 mg total) by mouth 2 (two) times daily.  60 tablet  6  . nitroGLYCERIN (NITROSTAT) 0.4 MG SL tablet Place 1 tablet (0.4 mg total) under the tongue as directed. Chest Pain  25 tablet  3  . omeprazole (PRILOSEC) 20 MG capsule Take 20 mg by mouth every morning.       . tamsulosin (FLOMAX) 0.4 MG CAPS capsule Take 0.4 mg by mouth daily.       No current facility-administered  medications for this visit.     Past Surgical History  Procedure Laterality Date  . Cholecystectomy    . Esophagogastroduodenoscopy  06/08/2002    Soft stricture of the GE  junction with erosive esophagitis. The stricture was dilated to 81 [place] using a Maloney dilator. Swollen fold at GE junction on the gastric site which was biopsied for histology and was suspicious for sentinel folds.  . Colonoscopy  06/08/2002    Small polyp was oblated via cold biopsy from the cecum and two were snared, one was at the rectosigmoid junction measuring about a centimeter, another one at the rectum which was smaller.  . Transurethral resection of bladder tumor      x7  . Coronary angioplasty  1997    3 stents  . Transurethral resection of bladder tumor  03/15/2012    Procedure: TRANSURETHRAL RESECTION OF  BLADDER TUMOR (TURBT);  Surgeon: Marissa Nestle, MD;  Location: [facility];  Service: Urology;  Laterality: N/A;  . Cataract extraction w/phaco  06/20/2012    Procedure: CATARACT EXTRACTION PHACO AND INTRAOCULAR LENS PLACEMENT (IOC);  Surgeon: Tonny Branch, MD;  Location: [facility];  Service: Ophthalmology;  Laterality: Right;  CDE=16.59  . Cataract extraction w/phaco Left 07/04/2012    Procedure: CATARACT EXTRACTION PHACO AND INTRAOCULAR LENS PLACEMENT (IOC);  Surgeon: Tonny Branch, MD;  Location: [facility];  Service: Ophthalmology;  Laterality: Left;  CDE: 24.33  . Transurethral resection of bladder tumor N/A 09/29/2012    Procedure: TRANSURETHRAL RESECTION OF BLADDER TUMOR (TURBT);  Surgeon: Marissa Nestle, MD;  Location: [facility];  Service: Urology;  Laterality: N/A;  . Multiple tooth extractions    . Cystoscopy with biopsy N/A 04/27/2013    Procedure: CYSTOSCOPY WITH BLADDER BIOPSY & FULGURATION;  Surgeon: Marissa Nestle, MD;  Location: [facility];  Service: Urology;  Laterality: N/A;     Allergies  Allergen Reactions  . Aspirin     Due to swelling with celecoxib  . Celecoxib Swelling  . Hydrocodone-Acetaminophen Itching  . Morphine Itching  . Niacin Itching  . Piroxicam Itching      Family History  Problem Relation Age of Onset  . Stroke Mother 18  . Diabetes Brother 74     Social History Mr. Yannuzzi reports that he quit smoking about 32 years ago. His smoking use included Cigarettes. He has a 10 pack-year smoking history. He has never used smokeless tobacco. Mr. Lunz reports that he does not drink alcohol.   Review of Systems CONSTITUTIONAL: No weight loss, fever, chills, weakness or fatigue.  HEENT: Eyes: No visual loss, blurred vision, double vision or yellow sclerae.No hearing loss, sneezing, congestion, runny nose or sore throat.  SKIN: No rash or itching.  CARDIOVASCULAR: per HPI RESPIRATORY: No shortness of breath, cough or sputum.  GASTROINTESTINAL: No anorexia, nausea,  vomiting or diarrhea. No abdominal pain or blood.  GENITOURINARY: No burning on urination, no polyuria NEUROLOGICAL: No headache, dizziness, syncope, paralysis, ataxia, numbness or tingling in the extremities. No change in bowel or bladder control.  MUSCULOSKELETAL: No muscle, back pain, joint pain or stiffness.  LYMPHATICS: No enlarged nodes. No history of splenectomy.  PSYCHIATRIC: No history of depression or anxiety.  ENDOCRINOLOGIC: No reports of sweating, cold or heat intolerance. No polyuria or polydipsia.  Marland Kitchen   Physical Examination p 64 bp 175/48 Wt 221 lbs BMI 34 Gen: resting comfortably, no acute distress HEENT: no scleral icterus, pupils equal round and reactive, no palptable cervical adenopathy,  CV: RRR, no m/r/g,  no JVD, no carotid bruits Resp: Clear to auscultation bilaterally GI: abdomen is soft, non-tender, non-distended, normal bowel sounds, no hepatosplenomegaly MSK: extremities are warm, no edema.  Skin: warm, no rash Neuro:  no focal deficits Psych: appropriate affect   Diagnostic Studies 11/2012 Echo: LVEF 55-60%, mild LVH, no WMAs,   11/2012 Carotid US:  Summary: Right: mild to moderate mixed plaque origin and proximal ICA.Left: moderate mixed plaque origin ICA and moderate soft plaque proximal ICA. Bilateral: 0-39% ICA stenosis. Vertebral artery flow is antegrade.  05/01/13 EKG  Sinus rhythm, incomplete RBBB  05/22/13 Lexiscan MPI Intermediate risk Lexiscan Cardiolite as outlined. There were no clearly diagnostic ST segment changes, no arrhythmias were noted. Moderate-sized inferolateral ischemic defect noted consistent with potential circumflex or RCA stenosis. LVEF 54% with normal LV volumes.      Assessment and Plan   1. Chest pain/CAD - intermediate risk Lexiscan MPI, he continues to have chest pain that is stable in severity and frequency after recent increase in metoprolol - will start imdur as 3rd antianginal - given his CKD and recurrent  bladder tumor/hematuria issues he is not a good candidate for cath. If he is committed to dialysis and the bladder issues remain resolved we can consider at that time. Continue to optimize medical management.   2. HTN - elevated, will follow after adding imdur as described above  3. Hyperlipidemia  - unclear statin history, he is on zetia and gemfibrozil so I presume he had some form of adverse reaction to statin previously. I do not have these records. I have requested the most recent clinic notes from his PCP Dr Gerarda Fraction to further investigate. Continue current therapy for now.    F/u 1 month   Arnoldo Lenis, M.D., F.A.C.C.

## 2013-06-06 NOTE — Patient Instructions (Signed)
Your physician recommends that you schedule a follow-up appointment in: 3 weeks with Dr. Harl Bowie. This appointment will be scheduled today before you leave.  Your physician has recommended you make the following change in your medication:  Start: Isosorbide Mononitrate (IMDUR) 30 MG one tablet once daily.   Continue all other medications the same.

## 2013-06-08 ENCOUNTER — Encounter: Payer: Self-pay | Admitting: Vascular Surgery

## 2013-06-08 ENCOUNTER — Encounter (HOSPITAL_COMMUNITY): Payer: Self-pay | Admitting: Emergency Medicine

## 2013-06-08 ENCOUNTER — Emergency Department (HOSPITAL_COMMUNITY): Payer: PRIVATE HEALTH INSURANCE

## 2013-06-08 ENCOUNTER — Inpatient Hospital Stay (HOSPITAL_COMMUNITY)
Admission: EM | Admit: 2013-06-08 | Discharge: 2013-06-14 | DRG: 247 | Disposition: A | Discharge status: Home / Self Care | Payer: PRIVATE HEALTH INSURANCE | Attending: Cardiology | Admitting: Cardiology

## 2013-06-08 DIAGNOSIS — I2510 Atherosclerotic heart disease of native coronary artery without angina pectoris: Secondary | ICD-10-CM

## 2013-06-08 DIAGNOSIS — R9439 Abnormal result of other cardiovascular function study: Secondary | ICD-10-CM | POA: Diagnosis present

## 2013-06-08 DIAGNOSIS — Z7982 Long term (current) use of aspirin: Secondary | ICD-10-CM

## 2013-06-08 DIAGNOSIS — Z8673 Personal history of transient ischemic attack (TIA), and cerebral infarction without residual deficits: Secondary | ICD-10-CM

## 2013-06-08 DIAGNOSIS — Z87891 Personal history of nicotine dependence: Secondary | ICD-10-CM

## 2013-06-08 DIAGNOSIS — I214 Non-ST elevation (NSTEMI) myocardial infarction: Secondary | ICD-10-CM

## 2013-06-08 DIAGNOSIS — I10 Essential (primary) hypertension: Secondary | ICD-10-CM | POA: Diagnosis present

## 2013-06-08 DIAGNOSIS — D649 Anemia, unspecified: Secondary | ICD-10-CM | POA: Diagnosis present

## 2013-06-08 DIAGNOSIS — E669 Obesity, unspecified: Secondary | ICD-10-CM | POA: Diagnosis present

## 2013-06-08 DIAGNOSIS — I249 Acute ischemic heart disease, unspecified: Secondary | ICD-10-CM

## 2013-06-08 DIAGNOSIS — E785 Hyperlipidemia, unspecified: Secondary | ICD-10-CM | POA: Diagnosis present

## 2013-06-08 DIAGNOSIS — N400 Enlarged prostate without lower urinary tract symptoms: Secondary | ICD-10-CM | POA: Diagnosis present

## 2013-06-08 DIAGNOSIS — Z8551 Personal history of malignant neoplasm of bladder: Secondary | ICD-10-CM

## 2013-06-08 DIAGNOSIS — Z794 Long term (current) use of insulin: Secondary | ICD-10-CM

## 2013-06-08 DIAGNOSIS — N184 Chronic kidney disease, stage 4 (severe): Secondary | ICD-10-CM | POA: Diagnosis present

## 2013-06-08 DIAGNOSIS — Z955 Presence of coronary angioplasty implant and graft: Secondary | ICD-10-CM

## 2013-06-08 DIAGNOSIS — I252 Old myocardial infarction: Secondary | ICD-10-CM

## 2013-06-08 DIAGNOSIS — C679 Malignant neoplasm of bladder, unspecified: Secondary | ICD-10-CM | POA: Diagnosis present

## 2013-06-08 DIAGNOSIS — Y849 Medical procedure, unspecified as the cause of abnormal reaction of the patient, or of later complication, without mention of misadventure at the time of the procedure: Secondary | ICD-10-CM | POA: Diagnosis present

## 2013-06-08 DIAGNOSIS — E1129 Type 2 diabetes mellitus with other diabetic kidney complication: Secondary | ICD-10-CM | POA: Diagnosis present

## 2013-06-08 DIAGNOSIS — I129 Hypertensive chronic kidney disease with stage 1 through stage 4 chronic kidney disease, or unspecified chronic kidney disease: Secondary | ICD-10-CM | POA: Diagnosis present

## 2013-06-08 DIAGNOSIS — Z79899 Other long term (current) drug therapy: Secondary | ICD-10-CM

## 2013-06-08 DIAGNOSIS — I509 Heart failure, unspecified: Secondary | ICD-10-CM | POA: Diagnosis present

## 2013-06-08 DIAGNOSIS — N058 Unspecified nephritic syndrome with other morphologic changes: Secondary | ICD-10-CM | POA: Diagnosis present

## 2013-06-08 DIAGNOSIS — T82897A Other specified complication of cardiac prosthetic devices, implants and grafts, initial encounter: Secondary | ICD-10-CM | POA: Diagnosis present

## 2013-06-08 LAB — CBC WITH DIFFERENTIAL/PLATELET
Basophils Absolute: 0 10*3/uL (ref 0.0–0.1)
Basophils Relative: 0 % (ref 0–1)
EOS ABS: 0.2 10*3/uL (ref 0.0–0.7)
Eosinophils Relative: 2 % (ref 0–5)
HCT: 33.9 % — ABNORMAL LOW (ref 39.0–52.0)
Hemoglobin: 11.7 g/dL — ABNORMAL LOW (ref 13.0–17.0)
LYMPHS ABS: 0.9 10*3/uL (ref 0.7–4.0)
Lymphocytes Relative: 11 % — ABNORMAL LOW (ref 12–46)
MCH: 31.9 pg (ref 26.0–34.0)
MCHC: 34.5 g/dL (ref 30.0–36.0)
MCV: 92.4 fL (ref 78.0–100.0)
Monocytes Absolute: 0.5 10*3/uL (ref 0.1–1.0)
Monocytes Relative: 6 % (ref 3–12)
Neutro Abs: 6.8 10*3/uL (ref 1.7–7.7)
Neutrophils Relative %: 81 % — ABNORMAL HIGH (ref 43–77)
PLATELETS: 202 10*3/uL (ref 150–400)
RBC: 3.67 MIL/uL — ABNORMAL LOW (ref 4.22–5.81)
RDW: 14.5 % (ref 11.5–15.5)
WBC: 8.4 10*3/uL (ref 4.0–10.5)

## 2013-06-08 LAB — BASIC METABOLIC PANEL
BUN: 32 mg/dL — AB (ref 6–23)
CALCIUM: 8.9 mg/dL (ref 8.4–10.5)
CO2: 20 mEq/L (ref 19–32)
Chloride: 108 mEq/L (ref 96–112)
Creatinine, Ser: 2.74 mg/dL — ABNORMAL HIGH (ref 0.50–1.35)
GFR calc Af Amer: 24 mL/min — ABNORMAL LOW (ref >90)
GFR, EST NON AFRICAN AMERICAN: 21 mL/min — AB (ref >90)
Glucose, Bld: 185 mg/dL — ABNORMAL HIGH (ref 70–99)
Potassium: 4.1 mEq/L (ref 3.7–5.3)
SODIUM: 143 meq/L (ref 137–147)

## 2013-06-08 LAB — PRO B NATRIURETIC PEPTIDE: Pro B Natriuretic peptide (BNP): 1150 pg/mL — ABNORMAL HIGH (ref 0–450)

## 2013-06-08 LAB — PROTIME-INR
INR: 0.95 (ref 0.00–1.49)
PROTHROMBIN TIME: 12.5 s (ref 11.6–15.2)

## 2013-06-08 LAB — APTT: APTT: 30 s (ref 24–37)

## 2013-06-08 LAB — TROPONIN I

## 2013-06-08 MED ORDER — HEPARIN BOLUS VIA INFUSION
4000.0000 [IU] | Freq: Once | INTRAVENOUS | Status: AC
  Administered 2013-06-08: 4000 [IU] via INTRAVENOUS

## 2013-06-08 MED ORDER — HEPARIN (PORCINE) IN NACL 100-0.45 UNIT/ML-% IJ SOLN
1500.0000 [IU]/h | INTRAMUSCULAR | Status: DC
  Administered 2013-06-08: 1250 [IU]/h via INTRAVENOUS
  Filled 2013-06-08 (×2): qty 250

## 2013-06-08 MED ORDER — NITROGLYCERIN IN D5W 200-5 MCG/ML-% IV SOLN
10.0000 ug/min | INTRAVENOUS | Status: DC
  Administered 2013-06-08: 10 ug/min via INTRAVENOUS
  Administered 2013-06-11 – 2013-06-12 (×2): 35 ug/min via INTRAVENOUS
  Administered 2013-06-13: 15 ug/min via INTRAVENOUS
  Filled 2013-06-08 (×4): qty 250

## 2013-06-08 NOTE — Progress Notes (Signed)
ANTICOAGULATION CONSULT NOTE - Initial Consult  Pharmacy Consult for Heparin Indication: chest pain/ACS  Allergies  Allergen Reactions  . Aspirin     Due to swelling with celecoxib  . Celecoxib Swelling  . Hydrocodone-Acetaminophen Itching  . Morphine Itching  . Niacin Itching  . Piroxicam Itching    Patient Measurements: Height: 5\' 8"  (172.7 cm) Weight: 220 lb (99.791 kg) IBW/kg (Calculated) : 68.4 Heparin Dosing Weight: 91 kg  Vital Signs: Temp: 97.7 F (36.5 C) (01/15 2136) Temp src: Oral (01/15 2136) BP: 170/86 mmHg (01/15 2136) Pulse Rate: 104 (01/15 2136)  Labs: No results found for this basename: HGB, HCT, PLT, APTT, LABPROT, INR, HEPARINUNFRC, CREATININE, CKTOTAL, CKMB, TROPONINI,  in the last 72 hours  Estimated Creatinine Clearance: 19.2 ml/min (by C-G formula based on Cr of 3.63).   Medical History: Past Medical History  Diagnosis Date  . ASCVD (arteriosclerotic cardiovascular disease)   . Dyspnea   . Dyslipidemia   . DJD (degenerative joint disease)   . Hypertension   . Diabetes mellitus   . Myocardial infarction 1997  . BPH (benign prostatic hyperplasia)   . CKD (chronic kidney disease) stage 3, GFR 30-59 ml/min   . Coronary artery disease   . Bladder cancer   . Stroke     Medications:  Scheduled:    Assessment: Heparin for  ACS / STEMI PTA medications reviewed Labs pending  Goal of Therapy:  Heparin level 0.3-0.7 units/ml Monitor platelets by anticoagulation protocol: Yes   Plan:  Give 4000 units bolus x 1 Start heparin infusion at 1250 units/hr Check anti-Xa level in 8 hours and daily while on heparin Continue to monitor H&H and platelets  Abner Greenspan, Ranvir Renovato Bennett 06/08/2013,9:57 PM

## 2013-06-08 NOTE — H&P (Signed)
History and Physical  Patient ID: Craig Castillo MRN: [id number], SOB: 1933/05/17 80 y.o. Date of Encounter: 06/08/2013, 10:56 PM  Primary Physician: Glo Herring., MD Primary Cardiologist: Dr. Carlyle Dolly  Chief Complaint: chest pain  HPI: 80 y.o. male w/ PMHx significant for CAD s/p PCI remotely, HTN, CKD, h/o bladder cancer who presented to [facility] with sudden onset of chest pain. Transferred to [facility] on 06/08/2013 due to concerns of NSTEMI.   He has recently been evaluated by cardiology after an ER visit due to chest pain. He was referred for an outpatient lexiscan MPI which demonstrated a moderate sized region of inferolateral ischemia, EF of 54%. Seen in followup and due to his significant CKD and recurrent bladder tumor/hematuria issues, aggressive medical management was pursued with addition of imdur. Not on a statin and his primary cardiologist was investigating background in this.  This evening of presentation, he had the sudden onset of chest pain radiating to his arms. +SOB. Similar sensation to his prior MIs. Not relieved with nitro x 1. Due to the intensity and persistence of the pain, sought emergent care.   Given aspirin, SL nitro. Due to persistent symptoms, was started on nitro and heparin gtt and transferred to [facility] ER for consideration of need for urgent catheterization.  Upon my interview, he reports chest pain of 0/10. Apparently over the last month has had significant escalation in use of SL nitro. Effort is limited to approx 100 feet. Denies CHF or PND symptoms though he is also somewhat unaware of his LE edema. Frequent urination due to his multiple bladder surgeries. Last hematuria was 1 month ago, now resolved.  Has appointment on 1/16 actually to begin fistula mapping. Aware that dialysis maybe in his near future.   EKG revealed NSR with RBBB and profound downsloping ST changes anterolaterally, new from 04/2013. CXR suggestive mild  CHF. Labs are significant for BNP fo 1000, glucose of 170, Cr of 2.7 (GFR 21). Hct of 34. Even with resolution of chest pain, the EKG changes persist.   Past Medical History  Diagnosis Date  . ASCVD (arteriosclerotic cardiovascular disease)   . Dyspnea   . Dyslipidemia   . DJD (degenerative joint disease)   . Hypertension   . Diabetes mellitus   . Myocardial infarction 1997  . BPH (benign prostatic hyperplasia)   . CKD (chronic kidney disease) stage 3, GFR 30-59 ml/min   . Coronary artery disease   . Bladder cancer   . Stroke      Surgical History:  Past Surgical History  Procedure Laterality Date  . Cholecystectomy    . Esophagogastroduodenoscopy  06/08/2002    Soft stricture of the GE  junction with erosive esophagitis. The stricture was dilated to 9 [place] using a Maloney dilator. Swollen fold at GE junction on the gastric site which was biopsied for histology and was suspicious for sentinel folds.  . Colonoscopy  06/08/2002    Small polyp was oblated via cold biopsy from the cecum and two were snared, one was at the rectosigmoid junction measuring about a centimeter, another one at the rectum which was smaller.  . Transurethral resection of bladder tumor      x7  . Coronary angioplasty  1997    3 stents  . Transurethral resection of bladder tumor  03/15/2012    Procedure: TRANSURETHRAL RESECTION OF BLADDER TUMOR (TURBT);  Surgeon: Marissa Nestle, MD;  Location: [facility];  Service: Urology;  Laterality: N/A;  . Cataract  extraction w/phaco  06/20/2012    Procedure: CATARACT EXTRACTION PHACO AND INTRAOCULAR LENS PLACEMENT (IOC);  Surgeon: Tonny Branch, MD;  Location: [facility];  Service: Ophthalmology;  Laterality: Right;  CDE=16.59  . Cataract extraction w/phaco Left 07/04/2012    Procedure: CATARACT EXTRACTION PHACO AND INTRAOCULAR LENS PLACEMENT (IOC);  Surgeon: Tonny Branch, MD;  Location: [facility];  Service: Ophthalmology;  Laterality: Left;  CDE: 24.33  . Transurethral resection  of bladder tumor N/A 09/29/2012    Procedure: TRANSURETHRAL RESECTION OF BLADDER TUMOR (TURBT);  Surgeon: Marissa Nestle, MD;  Location: [facility];  Service: Urology;  Laterality: N/A;  . Multiple tooth extractions    . Cystoscopy with biopsy N/A 04/27/2013    Procedure: CYSTOSCOPY WITH BLADDER BIOPSY & FULGURATION;  Surgeon: Marissa Nestle, MD;  Location: [facility];  Service: Urology;  Laterality: N/A;     Home Meds: Prior to Admission medications   Medication Sig Start Date End Date Taking? Authorizing Provider  amLODipine (NORVASC) 10 MG tablet Take 10 mg by mouth daily.   Yes Historical Provider, MD  aspirin EC 81 MG tablet Take 81 mg by mouth daily.   Yes Historical Provider, MD  ezetimibe (ZETIA) 10 MG tablet Take 10 mg by mouth every morning.    Yes Historical Provider, MD  gemfibrozil (LOPID) 600 MG tablet Take 600 mg by mouth 2 (two) times daily.    Yes Historical Provider, MD  insulin NPH (HUMULIN N,NOVOLIN N) 100 UNIT/ML injection Inject 15-20 Units into the skin 2 (two) times daily. Take 20 units SQ QAM and 15 units SQ QPM 04/28/13  Yes Kathie Dike, MD  isosorbide mononitrate (IMDUR) 30 MG 24 hr tablet Take 1 tablet (30 mg total) by mouth daily. 06/06/13  Yes Arnoldo Lenis, MD  metoprolol tartrate (LOPRESSOR) 50 MG tablet Take 1 tablet (50 mg total) by mouth 2 (two) times daily. 05/05/13  Yes Arnoldo Lenis, MD  nitroGLYCERIN (NITROSTAT) 0.4 MG SL tablet Place 1 tablet (0.4 mg total) under the tongue as directed. Chest Pain 05/05/13  Yes Arnoldo Lenis, MD  omeprazole (PRILOSEC) 20 MG capsule Take 20 mg by mouth every morning.    Yes Historical Provider, MD  tamsulosin (FLOMAX) 0.4 MG CAPS capsule Take 0.4 mg by mouth daily.   Yes Historical Provider, MD    Allergies:  Allergies  Allergen Reactions  . Aspirin     Due to swelling with celecoxib  . Celecoxib Swelling  . Hydrocodone-Acetaminophen Itching  . Morphine Itching  . Niacin Itching  . Piroxicam Itching     History   Social History  . Marital Status: Married    Spouse Name: N/A    Number of Children: N/A  . Years of Education: N/A   Occupational History  . Retired    Social History Main Topics  . Smoking status: Former Smoker -- 0.50 packs/day for 20 years    Types: Cigarettes    Quit date: 03/08/1981  . Smokeless tobacco: Never Used  . Alcohol Use: No  . Drug Use: No  . Sexual Activity: No   Other Topics Concern  . Not on file   Social History Narrative   Married   No regular exercise   Retired Armed forces training and education officer              Family History  Problem Relation Age of Onset  . Stroke Mother 98  . Diabetes Brother 70    Review of Systems:* General: negative for chills, fever, night sweats  or weight changes.  Cardiovascular: see HPI. Dermatological: negative for rash Respiratory: negative for cough or wheezing Urologic: negative for hematuria (last 2-4 weeks ago). Abdominal: negative for nausea, vomiting, diarrhea, bright red blood per rectum, melena, or hematemesis Neurologic: negative for visual changes, syncope, or dizziness All other systems reviewed and are otherwise negative except as noted above.  Labs:   Lab Results  Component Value Date   WBC 8.4 06/08/2013   HGB 11.7* 06/08/2013   HCT 33.9* 06/08/2013   MCV 92.4 06/08/2013   PLT 202 06/08/2013    Recent Labs Lab 06/08/13 2156  NA 143  K 4.1  CL 108  CO2 20  BUN 32*  CREATININE 2.74*  CALCIUM 8.9  GLUCOSE 185*    Recent Labs  06/08/13 2156  TROPONINI <0.30   Lab Results  Component Value Date   CHOL 130 12/13/2012   HDL 23* 12/13/2012   LDLCALC 83 12/13/2012   TRIG 122 12/13/2012   Lab Results  Component Value Date   DDIMER 0.28 11/20/2011    Radiology/Studies:  Nm Myocar Single W/spect W/wall Motion And Ef  05/23/2013   CLINICAL DATA:  80 year old male with reported history of CAD, renal insufficiency, hypertension, type 2 diabetes mellitus, hyperlipidemia, now referred with  chest pain symptoms for the assessment of ischemia.  EXAM: MYOCARDIAL IMAGING WITH SPECT (REST AND PHARMACOLOGIC-STRESS)  GATED LEFT VENTRICULAR WALL MOTION STUDY  LEFT VENTRICULAR EJECTION FRACTION  TECHNIQUE: Standard myocardial SPECT imaging was performed after resting intravenous injection of 10 mCi Tc-24m sestamibi. Subsequently, intravenous infusion of Lexiscan was performed under the supervision of the Cardiology staff. At peak effect of the drug, 30 mCi Tc-79m sestamibi was injected intravenously and standard myocardial SPECT imaging was performed. Quantitative gated imaging was also performed to evaluate left ventricular wall motion, and estimate left ventricular ejection fraction.  COMPARISON:  None.  FINDINGS: Baseline tracing shows sinus bradycardia at 57 beats per min with incomplete right bundle branch block pattern. Lexiscan bolus was given in standard fashion. Heart rate increased from 56 beats per min up 75 beats per min, and blood pressure remained stable at 143/67. No chest pain was reported. No diagnostic ST segment changes were noted, nonspecific overall. No arrhythmias noted.  Analysis of the overall perfusion data finds adequate radiotracer uptake.  Tomographic views were obtained using the short axis, vertical long axis, and horizontal long axis planes. There is a moderate-sized, moderate intensity, inferolateral defect that is reversible and consistent with ischemia, potentially in the circumflex or RCA distribution.  Gated imaging reveals an EDV of 132, ESV of 61, TID ratio of 1.06, and LVEF of 54% without obvious wall motion abnormality.  IMPRESSION: Intermediate risk Lexiscan Cardiolite as outlined. There were no clearly diagnostic ST segment changes, no arrhythmias were noted. Moderate-sized inferolateral ischemic defect noted consistent with potential circumflex or RCA stenosis. LVEF 54% with normal LV volumes.   Electronically Signed   By: Rozann Lesches M.D.   On: 05/23/2013 15:57    Dg Chest Port 1 View  06/08/2013   CLINICAL DATA:  Chest pain. Current history of hypertension, diabetes and chronic kidney disease. Prior MI.  EXAM: PORTABLE CHEST - 1 VIEW  COMPARISON:  Portable examinations 12/12/2012. Two-view chest x-ray 09/14/2012, 03/11/2012.  FINDINGS: Cardiac silhouette mildly to moderately enlarged for the [facility] portable technique, increased in size since the July, 2014 examination. Moderate diffuse interstitial pulmonary edema. Asymmetric airspace pulmonary edema in the right perihilar region. No visible pleural effusions.  IMPRESSION: Mild CHF, with  mild to moderate cardiomegaly and moderate diffuse interstitial pulmonary edema. Asymmetric airspace edema in the right perihilar region. Interval increase in heart size since July, 2014.   Electronically Signed   By: Evangeline Dakin M.D.   On: 06/08/2013 21:55     EKG:EKG revealed NSR with RBBB and profound downsloping ST changes anterolaterally, new from 04/2013  Physical Exam: Blood pressure 179/88, pulse 101, temperature 97.7 F (36.5 C), temperature source Oral, resp. rate 20, height 5\' 8"  (1.727 m), weight 99.791 kg (220 lb), SpO2 97.00%. General: Well developed, well nourished, in no acute distress. Head: Normocephalic, atraumatic, sclera non-icteric, nares are without discharge Neck: Supple. Negative for carotid bruits. JVD not well seen Lungs: distant breath sounds Heart: RRR with S1 S2. No murmurs, rubs, or gallops appreciated. Abdomen: Soft, non-tender, non-distended with normoactive bowel sounds. No rebound/guarding. No obvious abdominal masses. Msk:  Strength and tone appear normal for age. Extremities: +1 pitting edema to knees. No clubbing or cyanosis. Distal pedal pulses are 2+ and equal bilaterally. Neuro: Alert and oriented X 3. Moves all extremities spontaneously. Psych:  Responds to questions appropriately with a normal affect.   Problem List 1. NSTEMI: chest pain and marked ST depression 2. CAD s/p  stents (~1990s) 3. CKD, GFR of ~21 4. HTN 5. Acute mild congestive heart failure (elevated BNP, Cxray, LE edema), normal EF by MPI 04/2013 6. Recent hematuria, h/o bladder cancer 7. H/o esophagitis 8. DM2, on insulin   ASSESSMENT AND PLAN:  80 y.o. male w/ PMHx significant for CAD s/p PCI remotely, HTN, CKD, h/o bladder cancer who presented to [facility] with sudden onset of chest pain. Transferred to [facility] on 06/08/2013 due to concerns of NSTEMI on the basis of chest pain and marked EKG changes (anticipate troponin will become positive). Likely due to progression of disease supplying inferolateral aspect of the heart as seen on recent + MPI.  Maximizing therapy now with beta blocker, IV nitro gtt, aspirin and heparin gtt. Previously not on a statin (possibly due to myalgia but he can't remember) --> will re-introduce low dose atorvastatin and hold on gemfibrozil and zetia. Ideally, invasive strategy is appropriate for him; however, he is at extremely high risk of requiring dialysis post cath. However, benefits likely outweigh risks. Consider preemptive nephrology consult in anticipation of high risk of needing dialysis if invasive strategy is pursued (at this time, this seems to be the most appropriate given the myocardium at risk).  Mildly fluid overloaded by cxray and BNP. Normal EF by MPI. Hold on diuresing pre-cath in order to minimize risk of contrast induced nephropathy.  Avoid nephrotoxic medications. Dose adjust for reduced GFR.  H/o of bladder cancer, hematuria resolved. However, would consider BMS given this history.  Diabetes: change nph to lantus and give at reduced dose. Sliding scale to cover.  Prophylaxis: PPI Heparin gtt Full code.    Signed, Elias Else, Mila Homer MD 06/08/2013, 10:56 PM

## 2013-06-08 NOTE — ED Notes (Signed)
Pt got out of the shower this evening & started having chest pains. Pt took 3 nitro & 324 mg of ASA. EMS gave 1 nitro in route.

## 2013-06-08 NOTE — ED Notes (Signed)
Transfer from [facility] via [place].  Receiving is Dr. Gwenlyn Found.  Cp with significant cardiac hx. Pt. On heparin via PIV at 1250 u/hr., ntg 30 mcq. Cp 1/10. Vs: 176/96, hr 102, rr 19, 97% 2l [place]. Pt. Stable on arrival.

## 2013-06-08 NOTE — ED Provider Notes (Signed)
CSN: [id number]     Arrival date & time 06/08/13  2123 History  This chart was scribed for Craig Castillo, * by Rolanda Lundborg, ED Scribe. This patient was seen in room APA04/[facility]04 and the patient's care was started at 9:26 PM.    Chief Complaint  Patient presents with  . Chest Pain   The history is provided by the patient. No language interpreter was used.   HPI Comments: Craig Castillo is a 80 y.o. male who presents to the Emergency Department complaining of central CP that radiates into bilateral arms onset this evening when he got out of the shower. He states it feels similar to his prior MI for which he had 3 stents. He reports SOB. He denies nausea.   Past Medical History  Diagnosis Date  . ASCVD (arteriosclerotic cardiovascular disease)   . Dyspnea   . Dyslipidemia   . DJD (degenerative joint disease)   . Hypertension   . Diabetes mellitus   . Myocardial infarction 1997  . BPH (benign prostatic hyperplasia)   . CKD (chronic kidney disease) stage 3, GFR 30-59 ml/min   . Coronary artery disease   . Bladder cancer   . Stroke    Past Surgical History  Procedure Laterality Date  . Cholecystectomy    . Esophagogastroduodenoscopy  06/08/2002    Soft stricture of the GE  junction with erosive esophagitis. The stricture was dilated to 36 [place] using a Maloney dilator. Swollen fold at GE junction on the gastric site which was biopsied for histology and was suspicious for sentinel folds.  . Colonoscopy  06/08/2002    Small polyp was oblated via cold biopsy from the cecum and two were snared, one was at the rectosigmoid junction measuring about a centimeter, another one at the rectum which was smaller.  . Transurethral resection of bladder tumor      x7  . Coronary angioplasty  1997    3 stents  . Transurethral resection of bladder tumor  03/15/2012    Procedure: TRANSURETHRAL RESECTION OF BLADDER TUMOR (TURBT);  Surgeon: Marissa Nestle, MD;  Location: [facility];   Service: Urology;  Laterality: N/A;  . Cataract extraction w/phaco  06/20/2012    Procedure: CATARACT EXTRACTION PHACO AND INTRAOCULAR LENS PLACEMENT (IOC);  Surgeon: Tonny Branch, MD;  Location: [facility];  Service: Ophthalmology;  Laterality: Right;  CDE=16.59  . Cataract extraction w/phaco Left 07/04/2012    Procedure: CATARACT EXTRACTION PHACO AND INTRAOCULAR LENS PLACEMENT (IOC);  Surgeon: Tonny Branch, MD;  Location: [facility];  Service: Ophthalmology;  Laterality: Left;  CDE: 24.33  . Transurethral resection of bladder tumor N/A 09/29/2012    Procedure: TRANSURETHRAL RESECTION OF BLADDER TUMOR (TURBT);  Surgeon: Marissa Nestle, MD;  Location: [facility];  Service: Urology;  Laterality: N/A;  . Multiple tooth extractions    . Cystoscopy with biopsy N/A 04/27/2013    Procedure: CYSTOSCOPY WITH BLADDER BIOPSY & FULGURATION;  Surgeon: Marissa Nestle, MD;  Location: [facility];  Service: Urology;  Laterality: N/A;   Family History  Problem Relation Age of Onset  . Stroke Mother 49  . Diabetes Brother 72   History  Substance Use Topics  . Smoking status: Former Smoker -- 0.50 packs/day for 20 years    Types: Cigarettes    Quit date: 03/08/1981  . Smokeless tobacco: Never Used  . Alcohol Use: No    Review of Systems  Respiratory: Positive for shortness of breath.   Cardiovascular: Positive for chest pain.  Gastrointestinal: Negative for nausea.  All other systems reviewed and are negative.    Allergies  Aspirin; Celecoxib; Hydrocodone-acetaminophen; Morphine; Niacin; and Piroxicam  Home Medications   Current Outpatient Rx  Name  Route  Sig  Dispense  Refill  . amLODipine (NORVASC) 10 MG tablet   Oral   Take 10 mg by mouth daily.         Marland Kitchen aspirin EC 81 MG tablet   Oral   Take 81 mg by mouth daily.         Marland Kitchen ezetimibe (ZETIA) 10 MG tablet   Oral   Take 10 mg by mouth every morning.          Marland Kitchen gemfibrozil (LOPID) 600 MG tablet   Oral   Take 600 mg by mouth 2 (two) times  daily.          . insulin NPH (HUMULIN N,NOVOLIN N) 100 UNIT/ML injection   Subcutaneous   Inject 15-20 Units into the skin 2 (two) times daily. Take 20 units SQ QAM and 15 units SQ QPM   1 vial   12   . isosorbide mononitrate (IMDUR) 30 MG 24 hr tablet   Oral   Take 1 tablet (30 mg total) by mouth daily.   30 tablet   6   . metoprolol tartrate (LOPRESSOR) 50 MG tablet   Oral   Take 1 tablet (50 mg total) by mouth 2 (two) times daily.   60 tablet   6     Dose Increase 05-05-13   . nitroGLYCERIN (NITROSTAT) 0.4 MG SL tablet   Sublingual   Place 1 tablet (0.4 mg total) under the tongue as directed. Chest Pain   25 tablet   3   . omeprazole (PRILOSEC) 20 MG capsule   Oral   Take 20 mg by mouth every morning.          . tamsulosin (FLOMAX) 0.4 MG CAPS capsule   Oral   Take 0.4 mg by mouth daily.          BP 170/86  Pulse 104  Temp(Src) 97.7 F (36.5 C) (Oral)  Resp 20  Ht 5\' 8"  (1.727 m)  Wt 220 lb (99.791 kg)  BMI 33.46 kg/m2  SpO2 94% Physical Exam  Constitutional: He is oriented to person, place, and time. He appears well-developed and well-nourished. No distress.  HENT:  Head: Normocephalic and atraumatic.  Right Ear: Hearing normal.  Left Ear: Hearing normal.  Nose: Nose normal.  Mouth/Throat: Oropharynx is clear and moist and mucous membranes are normal.  Eyes: Conjunctivae and EOM are normal. Pupils are equal, round, and reactive to light.  Neck: Normal range of motion. Neck supple.  Cardiovascular: Regular rhythm, S1 normal and S2 normal.  Exam reveals no gallop and no friction rub.   No murmur heard. Pulmonary/Chest: Effort normal and breath sounds normal. No respiratory distress. He exhibits no tenderness.  Abdominal: Soft. Normal appearance and bowel sounds are normal. There is no hepatosplenomegaly. There is no tenderness. There is no rebound, no guarding, no tenderness at McBurney's point and negative Murphy's sign. No hernia.   Musculoskeletal: Normal range of motion.  Neurological: He is alert and oriented to person, place, and time. He has normal strength. No cranial nerve deficit or sensory deficit. Coordination normal. GCS eye subscore is 4. GCS verbal subscore is 5. GCS motor subscore is 6.  Skin: Skin is warm, dry and intact. No rash noted. No cyanosis.  Psychiatric: He has a normal  mood and affect. His speech is normal and behavior is normal. Thought content normal.    ED Course  Procedures (including critical care time) Medications  nitroGLYCERIN 0.2 mg/mL in dextrose 5 % infusion (25 mcg/min Intravenous Rate/Dose Change 06/08/13 2236)  heparin ADULT infusion 100 units/mL (25000 units/250 mL) (1,250 Units/hr Intravenous New Bag/Given 06/08/13 2201)  heparin bolus via infusion 4,000 Units (0 Units Intravenous Stopped 06/08/13 2205)    DIAGNOSTIC STUDIES: Oxygen Saturation is 94% on RA, adequate by my interpretation.    COORDINATION OF CARE: 9:43 PM- Discussed treatment plan with pt. Pt agrees to plan.    Labs Review Labs Reviewed  CBC WITH DIFFERENTIAL - Abnormal; Notable for the following:    RBC 3.67 (*)    Hemoglobin 11.7 (*)    HCT 33.9 (*)    Neutrophils Relative % 81 (*)    Lymphocytes Relative 11 (*)    All other components within normal limits  BASIC METABOLIC PANEL - Abnormal; Notable for the following:    Glucose, Bld 185 (*)    BUN 32 (*)    Creatinine, Ser 2.74 (*)    GFR calc non Af Amer 21 (*)    GFR calc Af Amer 24 (*)    All other components within normal limits  PRO B NATRIURETIC PEPTIDE - Abnormal; Notable for the following:    Pro B Natriuretic peptide (BNP) 1150.0 (*)    All other components within normal limits  TROPONIN I  PROTIME-INR  APTT  HEPARIN LEVEL (UNFRACTIONATED)  CBC   Imaging Review Dg Chest Port 1 View  06/08/2013   CLINICAL DATA:  Chest pain. Current history of hypertension, diabetes and chronic kidney disease. Prior MI.  EXAM: PORTABLE CHEST - 1 VIEW   COMPARISON:  Portable examinations 12/12/2012. Two-view chest x-ray 09/14/2012, 03/11/2012.  FINDINGS: Cardiac silhouette mildly to moderately enlarged for the [facility] portable technique, increased in size since the July, 2014 examination. Moderate diffuse interstitial pulmonary edema. Asymmetric airspace pulmonary edema in the right perihilar region. No visible pleural effusions.  IMPRESSION: Mild CHF, with mild to moderate cardiomegaly and moderate diffuse interstitial pulmonary edema. Asymmetric airspace edema in the right perihilar region. Interval increase in heart size since July, 2014.   Electronically Signed   By: Evangeline Dakin M.D.   On: 06/08/2013 21:55    EKG Interpretation    Date/Time:  Thursday June 08 2013 21:45:20 EST Ventricular Rate:  102 PR Interval:  300 QRS Duration: 130 QT Interval:  360 QTC Calculation: 469 R Axis:   14 Text Interpretation:  Sinus tachycardia with 1st degree A-V block Right bundle branch block Anterolateral ischemia Abnormal ECG Confirmed by POLLINA  MD, CHRISTOPHER ([id number]) on 06/08/2013 10:18:55 PM            MDM   1. Acute coronary syndrome    Patient presents to ER with complaints of sudden onset of severe substernal chest pain radiating to both arms. Patient has a history of coronary artery disease with stenting in the late 90s. Patient reports that the pain is experiencing is identical to his MI that he had prior to stenting. EKG was very concerning. Patient has anterior and lateral ST depressions with elevations in aVR and V1. Because of this pattern, he did not meet classic criteria for ST elevation MI, but it is concerning for left main disease, and therefore was discussed with Doctor Gwenlyn Found, on call for interventional cardiology. He confirmed that the patient was not an ST elevation MI to be  transferred directly to the Cath Lab, but recommended the patient be transferred to [facility] ER for the cardiology fellow to reevaluate.  Patient had  been given aspirin by EMS. Based on his EKG findings, patient was initiated on heparin and IV nitroglycerin. Nitroglycerin has been titrated upwards and the patient has had significant improvement with his pain, but he is not pain-free. The patient indicates a previous allergy to morphine and he is not sure what the allergy is. I therefore have not given him any narcotic analgesia for his chest pain.  CRITICAL CARE Performed by: Craig Castillo   Total critical care time: 56min  Critical care time was exclusive of separately billable procedures and treating other patients.  Critical care was necessary to treat or prevent imminent or life-threatening deterioration.  Critical care was time spent personally by me on the following activities: development of treatment plan with patient and/or surrogate as well as nursing, discussions with consultants, evaluation of patient's response to treatment, examination of patient, obtaining history from patient or surrogate, ordering and performing treatments and interventions, ordering and review of laboratory studies, ordering and review of radiographic studies, pulse oximetry and re-evaluation of patient's condition.   I personally performed the services described in this documentation, which was scribed in my presence. The recorded information has been reviewed and is accurate.   Craig Greek, MD 06/08/13 [phone]

## 2013-06-09 ENCOUNTER — Ambulatory Visit: Payer: PRIVATE HEALTH INSURANCE | Admitting: Vascular Surgery

## 2013-06-09 ENCOUNTER — Encounter (HOSPITAL_COMMUNITY): Payer: PRIVATE HEALTH INSURANCE

## 2013-06-09 ENCOUNTER — Other Ambulatory Visit (HOSPITAL_COMMUNITY): Payer: PRIVATE HEALTH INSURANCE

## 2013-06-09 ENCOUNTER — Encounter (HOSPITAL_COMMUNITY)
Admission: EM | Disposition: A | Discharge status: Home / Self Care | Payer: Self-pay | Source: Home / Self Care | Attending: Cardiology

## 2013-06-09 ENCOUNTER — Encounter (HOSPITAL_COMMUNITY): Payer: Self-pay | Admitting: Cardiology

## 2013-06-09 DIAGNOSIS — I200 Unstable angina: Secondary | ICD-10-CM

## 2013-06-09 DIAGNOSIS — I517 Cardiomegaly: Secondary | ICD-10-CM

## 2013-06-09 DIAGNOSIS — I214 Non-ST elevation (NSTEMI) myocardial infarction: Secondary | ICD-10-CM | POA: Diagnosis present

## 2013-06-09 DIAGNOSIS — I2510 Atherosclerotic heart disease of native coronary artery without angina pectoris: Secondary | ICD-10-CM

## 2013-06-09 DIAGNOSIS — R9439 Abnormal result of other cardiovascular function study: Secondary | ICD-10-CM | POA: Diagnosis present

## 2013-06-09 HISTORY — PX: LEFT HEART CATHETERIZATION WITH CORONARY ANGIOGRAM: SHX5451

## 2013-06-09 LAB — CBC
HEMATOCRIT: 30.9 % — AB (ref 39.0–52.0)
Hemoglobin: 10.4 g/dL — ABNORMAL LOW (ref 13.0–17.0)
MCH: 30.6 pg (ref 26.0–34.0)
MCHC: 33.7 g/dL (ref 30.0–36.0)
MCV: 90.9 fL (ref 78.0–100.0)
Platelets: 197 10*3/uL (ref 150–400)
RBC: 3.4 MIL/uL — ABNORMAL LOW (ref 4.22–5.81)
RDW: 14.5 % (ref 11.5–15.5)
WBC: 7.6 10*3/uL (ref 4.0–10.5)

## 2013-06-09 LAB — URINALYSIS, ROUTINE W REFLEX MICROSCOPIC
Bilirubin Urine: NEGATIVE
Glucose, UA: NEGATIVE mg/dL
KETONES UR: NEGATIVE mg/dL
Nitrite: NEGATIVE
PROTEIN: 100 mg/dL — AB
Specific Gravity, Urine: 1.01 (ref 1.005–1.030)
UROBILINOGEN UA: 0.2 mg/dL (ref 0.0–1.0)
pH: 6 (ref 5.0–8.0)

## 2013-06-09 LAB — GLUCOSE, CAPILLARY
GLUCOSE-CAPILLARY: 85 mg/dL (ref 70–99)
GLUCOSE-CAPILLARY: 89 mg/dL (ref 70–99)
Glucose-Capillary: 102 mg/dL — ABNORMAL HIGH (ref 70–99)
Glucose-Capillary: 185 mg/dL — ABNORMAL HIGH (ref 70–99)

## 2013-06-09 LAB — BASIC METABOLIC PANEL
BUN: 31 mg/dL — AB (ref 6–23)
CHLORIDE: 112 meq/L (ref 96–112)
CO2: 19 mEq/L (ref 19–32)
CREATININE: 2.63 mg/dL — AB (ref 0.50–1.35)
Calcium: 8.6 mg/dL (ref 8.4–10.5)
GFR calc Af Amer: 25 mL/min — ABNORMAL LOW (ref >90)
GFR, EST NON AFRICAN AMERICAN: 22 mL/min — AB (ref >90)
Glucose, Bld: 93 mg/dL (ref 70–99)
Potassium: 4.2 mEq/L (ref 3.7–5.3)
Sodium: 145 mEq/L (ref 137–147)

## 2013-06-09 LAB — MRSA PCR SCREENING: MRSA by PCR: NEGATIVE

## 2013-06-09 LAB — HEPARIN LEVEL (UNFRACTIONATED): Heparin Unfractionated: 0.1 IU/mL — ABNORMAL LOW (ref 0.30–0.70)

## 2013-06-09 LAB — URINE MICROSCOPIC-ADD ON

## 2013-06-09 LAB — TROPONIN I
Troponin I: >20 ng/mL (ref <0.30)
Troponin I: 14.82 ng/mL (ref <0.30)

## 2013-06-09 LAB — POCT ACTIVATED CLOTTING TIME: Activated Clotting Time: 99 seconds

## 2013-06-09 SURGERY — LEFT HEART CATHETERIZATION WITH CORONARY ANGIOGRAM
Anesthesia: LOCAL

## 2013-06-09 MED ORDER — HEPARIN BOLUS VIA INFUSION
2500.0000 [IU] | Freq: Once | INTRAVENOUS | Status: AC
  Administered 2013-06-09: 2500 [IU] via INTRAVENOUS
  Filled 2013-06-09: qty 2500

## 2013-06-09 MED ORDER — NITROGLYCERIN 0.2 MG/ML ON CALL CATH LAB
INTRAVENOUS | Status: AC
  Filled 2013-06-09: qty 1

## 2013-06-09 MED ORDER — TAMSULOSIN HCL 0.4 MG PO CAPS
0.4000 mg | ORAL_CAPSULE | Freq: Every day | ORAL | Status: DC
  Administered 2013-06-09 – 2013-06-13 (×4): 0.4 mg via ORAL
  Filled 2013-06-09 (×6): qty 1

## 2013-06-09 MED ORDER — INSULIN GLARGINE 100 UNIT/ML ~~LOC~~ SOLN
30.0000 [IU] | Freq: Every day | SUBCUTANEOUS | Status: DC
Start: 2013-06-09 — End: 2013-06-14
  Administered 2013-06-09 – 2013-06-11 (×3): 30 [IU] via SUBCUTANEOUS
  Administered 2013-06-12: 22:00:00 15 [IU] via SUBCUTANEOUS
  Administered 2013-06-13: 23:00:00 30 [IU] via SUBCUTANEOUS
  Filled 2013-06-09 (×7): qty 0.3

## 2013-06-09 MED ORDER — ASPIRIN 81 MG PO CHEW
324.0000 mg | CHEWABLE_TABLET | ORAL | Status: AC
  Administered 2013-06-09: 324 mg via ORAL
  Filled 2013-06-09: qty 4

## 2013-06-09 MED ORDER — SODIUM CHLORIDE 0.9 % IJ SOLN
3.0000 mL | Freq: Two times a day (BID) | INTRAMUSCULAR | Status: DC
  Administered 2013-06-09: 3 mL via INTRAVENOUS

## 2013-06-09 MED ORDER — SODIUM CHLORIDE 0.9 % IV SOLN: 1.0000 mL/kg/h | INTRAVENOUS | Status: AC

## 2013-06-09 MED ORDER — DIAZEPAM 2 MG PO TABS: 2.0000 mg | ORAL_TABLET | ORAL | Status: DC

## 2013-06-09 MED ORDER — ASPIRIN 300 MG RE SUPP
300.0000 mg | RECTAL | Status: AC
  Filled 2013-06-09: qty 1

## 2013-06-09 MED ORDER — INSULIN ASPART 100 UNIT/ML ~~LOC~~ SOLN
0.0000 [IU] | Freq: Three times a day (TID) | SUBCUTANEOUS | Status: DC
Start: 2013-06-09 — End: 2013-06-14
  Administered 2013-06-10: 2 [IU] via SUBCUTANEOUS

## 2013-06-09 MED ORDER — SODIUM CHLORIDE 0.9 % IJ SOLN: 3.0000 mL | INTRAMUSCULAR | Status: DC | PRN

## 2013-06-09 MED ORDER — ASPIRIN 81 MG PO CHEW
81.0000 mg | CHEWABLE_TABLET | ORAL | Status: AC
  Administered 2013-06-09: 81 mg via ORAL
  Filled 2013-06-09: qty 1

## 2013-06-09 MED ORDER — ASPIRIN 81 MG PO CHEW
81.0000 mg | CHEWABLE_TABLET | Freq: Every day | ORAL | Status: DC
  Administered 2013-06-10 – 2013-06-14 (×5): 81 mg via ORAL
  Filled 2013-06-09 (×4): qty 1

## 2013-06-09 MED ORDER — EZETIMIBE 10 MG PO TABS
10.0000 mg | ORAL_TABLET | Freq: Every morning | ORAL | Status: DC
  Administered 2013-06-09 – 2013-06-13 (×4): 10 mg via ORAL
  Filled 2013-06-09 (×6): qty 1

## 2013-06-09 MED ORDER — ASPIRIN EC 81 MG PO TBEC: 81.0000 mg | DELAYED_RELEASE_TABLET | Freq: Every day | ORAL | Status: DC

## 2013-06-09 MED ORDER — HEPARIN (PORCINE) IN NACL 2-0.9 UNIT/ML-% IJ SOLN
INTRAMUSCULAR | Status: AC
  Filled 2013-06-09: qty 1000

## 2013-06-09 MED ORDER — METOPROLOL TARTRATE 50 MG PO TABS
50.0000 mg | ORAL_TABLET | Freq: Two times a day (BID) | ORAL | Status: DC
  Administered 2013-06-09 – 2013-06-11 (×6): 50 mg via ORAL
  Filled 2013-06-09 (×6): qty 1
  Filled 2013-06-09: qty 2
  Filled 2013-06-09: qty 1
  Filled 2013-06-09: qty 2
  Filled 2013-06-09: qty 1

## 2013-06-09 MED ORDER — SODIUM CHLORIDE 0.9 % IV SOLN
250.0000 mL | INTRAVENOUS | Status: DC
  Administered 2013-06-09: 250 mL via INTRAVENOUS

## 2013-06-09 MED ORDER — AMLODIPINE BESYLATE 10 MG PO TABS
10.0000 mg | ORAL_TABLET | Freq: Every day | ORAL | Status: DC
  Administered 2013-06-09 – 2013-06-14 (×5): 10 mg via ORAL
  Filled 2013-06-09 (×7): qty 1

## 2013-06-09 MED ORDER — TICAGRELOR 90 MG PO TABS
90.0000 mg | ORAL_TABLET | Freq: Two times a day (BID) | ORAL | Status: DC
  Administered 2013-06-09 – 2013-06-14 (×10): 90 mg via ORAL
  Filled 2013-06-09 (×14): qty 1

## 2013-06-09 MED ORDER — LIDOCAINE HCL (PF) 1 % IJ SOLN
INTRAMUSCULAR | Status: AC
  Filled 2013-06-09: qty 30

## 2013-06-09 MED ORDER — PANTOPRAZOLE SODIUM 40 MG PO TBEC
40.0000 mg | DELAYED_RELEASE_TABLET | Freq: Every day | ORAL | Status: DC
  Administered 2013-06-09 – 2013-06-14 (×5): 40 mg via ORAL
  Filled 2013-06-09 (×6): qty 1

## 2013-06-09 MED ORDER — FENTANYL CITRATE 0.05 MG/ML IJ SOLN
INTRAMUSCULAR | Status: AC
  Filled 2013-06-09: qty 2

## 2013-06-09 MED ORDER — ATORVASTATIN CALCIUM 20 MG PO TABS
20.0000 mg | ORAL_TABLET | Freq: Every day | ORAL | Status: DC
  Administered 2013-06-09 – 2013-06-13 (×5): 20 mg via ORAL
  Filled 2013-06-09 (×8): qty 1

## 2013-06-09 MED ORDER — MIDAZOLAM HCL 2 MG/2ML IJ SOLN
INTRAMUSCULAR | Status: AC
  Filled 2013-06-09: qty 2

## 2013-06-09 MED ORDER — HEPARIN (PORCINE) IN NACL 100-0.45 UNIT/ML-% IJ SOLN
1500.0000 [IU]/h | INTRAMUSCULAR | Status: DC
  Administered 2013-06-10: 1500 [IU]/h via INTRAVENOUS
  Filled 2013-06-09 (×2): qty 250

## 2013-06-09 NOTE — ED Notes (Signed)
Pt currently denying chest pain.

## 2013-06-09 NOTE — Progress Notes (Signed)
ANTICOAGULATION CONSULT NOTE - Follow-up  Pharmacy Consult for Heparin Indication: chest pain/ACS  Allergies  Allergen Reactions  . Aspirin     Due to swelling with celecoxib  . Celecoxib Swelling  . Hydrocodone-Acetaminophen Itching  . Morphine Itching  . Niacin Itching  . Piroxicam Itching    Patient Measurements: Height: 5\' 8"  (172.7 cm) Weight: 219 lb 2.2 oz (99.4 kg) IBW/kg (Calculated) : 68.4 Heparin Dosing Weight: 91 kg  Vital Signs: Temp: 97.9 F (36.6 C) (01/16 0821) Temp src: Oral (01/16 0821) BP: 100/81 mmHg (01/16 0821) Pulse Rate: 62 (01/16 0821)  Labs:  Recent Labs  06/08/13 2156 06/09/13 0720  HGB 11.7* 10.4*  HCT 33.9* 30.9*  PLT 202 197  APTT 30  --   LABPROT 12.5  --   INR 0.95  --   HEPARINUNFRC  --  0.10*  CREATININE 2.74* 2.63*  TROPONINI <0.30 >20.00*    Estimated Creatinine Clearance: 26.5 ml/min (by C-G formula based on Cr of 2.63).  Assessment: 34 yom presented to the hospital with chest pain and was started on IV heparin. Initial heparin level is low at 0.1. H/H is down slightly but plts remain WNL. No bleeding or other problems noted per RN. Troponin is >20 today. Planning possible cardiac cath but may require dialysis graft placement first.   Goal of Therapy:  Heparin level 0.3-0.7 units/ml Monitor platelets by anticoagulation protocol: Yes   Plan:  1. Heparin bolus 2500 units IV X 1 2. Increase heparin gtt to 1500 units/hr 3. Check an 8 hour heparin level 4. Continue daily heparin level and CBC 5. F/u cath plans  Salome Arnt, PharmD, BCPS Pager # [phone] 06/09/2013 8:27 AM

## 2013-06-09 NOTE — Progress Notes (Signed)
Subjective: No pain this AM or SOB.  80 year old male with CKD4, that was to see vascular today for discussion of AV graft, but developed significant chest pain with SOB yesterday.  Hx of 3 coronary stents placed in 1997.  Recent + nuc study with inf. Ischemia.  Medical therapy planned due to CKD and hx bladder cancer.  Now with acute angina.  Objective: Vital signs in last 24 hours: Temp:  [97.7 F (36.5 C)-98.5 F (36.9 C)] 98.5 F (36.9 C) (01/16 0434) Pulse Rate:  [58-104] 58 (01/16 0600) Resp:  [15-21] 18 (01/16 0600) BP: (130-179)/(65-90) 130/65 mmHg (01/16 0600) SpO2:  [93 %-98 %] 97 % (01/16 0600) Weight:  [219 lb 2.2 oz (99.4 kg)-220 lb (99.791 kg)] 219 lb 2.2 oz (99.4 kg) (01/16 0456) Weight change:  Last BM Date: 06/08/13 Intake/Output from previous day: +126 01/15 0701 - 01/16 0700 In: 126.6 [I.V.:126.6] Out: -  Intake/Output this shift:    PE: General:Pleasant affect, NAD Skin:Warm and dry, brisk capillary refill HEENT:normocephalic, sclera clear, mucus membranes moist Heart:S1S2 RRR without murmur, gallup, rub or click Lungs:clear without rales, rhonchi, or wheezes DXI:PJASN, soft, mild tenderness, + BS, do not palpate liver spleen or masses Ext:no lower ext edema, 2+ pedal pulses, 2+ radial pulses Neuro:alert and oriented, MAE, follows commands, + facial symmetry  GU: condom cath in place due to leakage since bladder cancer.  Lab Results:  Recent Labs  06/08/13 2156  WBC 8.4  HGB 11.7*  HCT 33.9*  PLT 202   BMET  Recent Labs  06/08/13 2156  NA 143  K 4.1  CL 108  CO2 20  GLUCOSE 185*  BUN 32*  CREATININE 2.74*  CALCIUM 8.9    Recent Labs  06/08/13 2156  TROPONINI <0.30    Lab Results  Component Value Date   CHOL 130 12/13/2012   HDL 23* 12/13/2012   LDLCALC 83 12/13/2012   TRIG 122 12/13/2012   CHOLHDL 5.7 12/13/2012   Lab Results  Component Value Date   HGBA1C 5.6 12/13/2012         Studies/Results: Dg Chest  Port 1 View  06/08/2013   CLINICAL DATA:  Chest pain. Current history of hypertension, diabetes and chronic kidney disease. Prior MI.  EXAM: PORTABLE CHEST - 1 VIEW  COMPARISON:  Portable examinations 12/12/2012. Two-view chest x-ray 09/14/2012, 03/11/2012.  FINDINGS: Cardiac silhouette mildly to moderately enlarged for the [facility] portable technique, increased in size since the July, 2014 examination. Moderate diffuse interstitial pulmonary edema. Asymmetric airspace pulmonary edema in the right perihilar region. No visible pleural effusions.  IMPRESSION: Mild CHF, with mild to moderate cardiomegaly and moderate diffuse interstitial pulmonary edema. Asymmetric airspace edema in the right perihilar region. Interval increase in heart size since July, 2014.   Electronically Signed   By: Evangeline Dakin M.D.   On: 06/08/2013 21:55    Medications: I have reviewed the patient's current medications. Scheduled Meds: . amLODipine  10 mg Oral Daily  . [START ON 06/10/2013] aspirin EC  81 mg Oral Daily  . atorvastatin  20 mg Oral q1800  . ezetimibe  10 mg Oral q morning - 10a  . insulin aspart  0-15 Units Subcutaneous TID WC  . insulin glargine  30 Units Subcutaneous QHS  . metoprolol tartrate  50 mg Oral BID  . pantoprazole  40 mg Oral Daily  . tamsulosin  0.4 mg Oral Daily   Continuous Infusions: . heparin 1,250 Units/hr (  06/09/13 0300)  . nitroGLYCERIN 35 mcg/min (06/09/13 0027)   PRN Meds:.  Assessment/Plan: Principal Problem:   NSTEMI (non-ST elevated myocardial infarction) Active Problems:   CARCINOMA, BLADDER   DYSLIPIDEMIA   HYPERTENSION   ATHEROSCLEROTIC CARDIOVASCULAR DISEASE   DM (diabetes mellitus), type 2 with renal complications   Abnormal nuclear stress test, 06/06/13, mod inf. lat ischemia  PLAN: 1st troponin neg. Second pending, currently stable on IV NTG and Heparin. Pt was preparing for Graft placement for dialysis.  MD to see, possible cardiac cath this admit.  Awaiting second  troponin.  Continue hep.NTG for now.  Addendum:  Troponin now >20.  Made pt NPO.  LOS: 1 day   Time spent with pt. :15 minutes. INGOLD,LAURA R  Nurse Practitioner Certified Pager [phone] or after 5pm and on weekends call [phone] 06/09/2013, 7:31 AM   History and all data above reviewed.  Patient examined.  I agree with the findings as above. No further chest pain.  However, positive enzymes.   The patient exam reveals COR:RRR, no rub  ,  Lungs: Clear  ,  Abd: Positive bowel sounds, no rebound no guarding, Ext No edema  .  All available labs, radiology testing, previous records reviewed. Agree with documented assessment and plan. NQWMI:  Cath today.  The patient understands that risks included but are not limited to stroke (1 in 1000), death (1 in 1000), kidney failure [usually temporary] (1 in 500), bleeding (1 in 200), allergic reaction [possibly serious] (1 in 200).  The patient understands and agrees to proceed.   Lean toward BMS as he will need AV fistula placement at some point.  CKD:  We will call renal.    Malika Demario  9:16 AM  06/09/2013  

## 2013-06-09 NOTE — Progress Notes (Signed)
CRITICAL VALUE ALERT  Critical value received:  Troponin >20.00  Date of notification:  06/09/2013  Time of notification:  0815  Critical value read back:yes  Nurse who received alert:  Emilia Beck, RN   MD notified (1st page):  Cecilie Kicks, NP for Dr Ula Lingo  Time of first page:  0824  MD notified (2nd page):  Time of second page:  Responding MD:  Cecilie Kicks, NP for Dr Ula Lingo  Time MD responded:  [phone]

## 2013-06-09 NOTE — Interval H&P Note (Signed)
History and Physical Interval Note:  06/09/2013 4:03 PM  Craig Castillo  has presented today for surgery, with the diagnosis of cp  The various methods of treatment have been discussed with the patient and family. After consideration of risks, benefits and other options for treatment, the patient has consented to  Procedure(s): LEFT HEART CATHETERIZATION WITH CORONARY ANGIOGRAM (N/A) as a surgical intervention .  The patient's history has been reviewed, patient examined, no change in status, stable for surgery.  I have reviewed the patient's chart and labs.  Questions were answered to the patient's satisfaction.    Cath Lab Visit (complete for each Cath Lab visit)  Clinical Evaluation Leading to the Procedure:   ACS: yes  Non-ACS:    Anginal Classification: CCS IV  Anti-ischemic medical therapy: Maximal Therapy (2 or more classes of medications)  Non-Invasive Test Results: Intermediate-risk stress test findings: cardiac mortality 1-3%/year  Prior CABG: No previous CABG       Craig Salina [facility] 06/09/2013 4:04 PM

## 2013-06-09 NOTE — CV Procedure (Signed)
   Cardiac Catheterization Procedure Note  Name: Craig Castillo MRN: [id number] DOB: [birth year]  Procedure: Left Heart Cath, Selective Coronary Angiography  Indication: 80 yo WM with history of CAD s/p remote stents in 1997 present with NSTEMI. He has CKD stage 4. He was hydrated prior to procedure.   Procedural details: The right groin was prepped, draped, and anesthetized with 1% lidocaine. Using modified Seldinger technique, a 5 French sheath was introduced into the right femoral artery. Standard Judkins catheters were used for coronary angiography. Catheter exchanges were performed over a guidewire. There were no immediate procedural complications. The patient was transferred to the post catheterization recovery area for further monitoring. 40 cc of contrast was used.  Procedural Findings: Hemodynamics:  AO 143/74 mean 106 mm Hg LV 144/24 mm Hg   Coronary angiography: Coronary dominance: right  Left mainstem: Mild tapering distal to 10-20%.  Left anterior descending (LAD): The proximal LAD is heavily calcified. There is diffuse 30-40% disease in the proximal vessel. There are multiple small diagonals without significant disease.  Left circumflex (LCx): The LCx gives off 2 OM branches that are moderate in size. The proximal LCx is heavily calcified with segmental 95% stenosis. The mid LCx and both OMs have mild disease diffusely to 30%.   Right coronary artery (RCA): The RCA appears to have stents from the proximal vessel to past the crux. There is diffuse calcification. There is diffuse 50% disease throughout the stented segment. There is a focal 90% stenosis at the crux that appears to be within the stent.   Left ventriculography: Not done.  Final Conclusions:   1. 2 vessel obstructive CAD. The culprit lesion appears to be the proximal LCx. The RCA has extensive stenting with moderate diffuse in stent disease and a focal area of severe stenosis at the crux.  Recommendations:  Recommend hydration over the weekend with close monitoring of renal function. Given complexity of disease I would recommend staging PCI on Monday. Will load with Brilinta. I anticipate treating the LCx will require rotational atherectomy given the heavy calcification and I would recommend DES since he has complex lesion and CKD given high risk of restenosis. It may be best to treat the culprit vessel only then follow on medical therapy for the RCA disease.  [name] [facility] 06/09/2013, 4:53 PM

## 2013-06-09 NOTE — H&P (View-Only) (Signed)
Subjective: No pain this AM or SOB.  80 year old male with CKD4, that was to see vascular today for discussion of AV graft, but developed significant chest pain with SOB yesterday.  Hx of 3 coronary stents placed in 1997.  Recent + nuc study with inf. Ischemia.  Medical therapy planned due to CKD and hx bladder cancer.  Now with acute angina.  Objective: Vital signs in last 24 hours: Temp:  [97.7 F (36.5 C)-98.5 F (36.9 C)] 98.5 F (36.9 C) (01/16 0434) Pulse Rate:  [58-104] 58 (01/16 0600) Resp:  [15-21] 18 (01/16 0600) BP: (130-179)/(65-90) 130/65 mmHg (01/16 0600) SpO2:  [93 %-98 %] 97 % (01/16 0600) Weight:  [219 lb 2.2 oz (99.4 kg)-220 lb (99.791 kg)] 219 lb 2.2 oz (99.4 kg) (01/16 0456) Weight change:  Last BM Date: 06/08/13 Intake/Output from previous day: +126 01/15 0701 - 01/16 0700 In: 126.6 [I.V.:126.6] Out: -  Intake/Output this shift:    PE: General:Pleasant affect, NAD Skin:Warm and dry, brisk capillary refill HEENT:normocephalic, sclera clear, mucus membranes moist Heart:S1S2 RRR without murmur, gallup, rub or click Lungs:clear without rales, rhonchi, or wheezes DXI:PJASN, soft, mild tenderness, + BS, do not palpate liver spleen or masses Ext:no lower ext edema, 2+ pedal pulses, 2+ radial pulses Neuro:alert and oriented, MAE, follows commands, + facial symmetry  GU: condom cath in place due to leakage since bladder cancer.  Lab Results:  Recent Labs  06/08/13 2156  WBC 8.4  HGB 11.7*  HCT 33.9*  PLT 202   BMET  Recent Labs  06/08/13 2156  NA 143  K 4.1  CL 108  CO2 20  GLUCOSE 185*  BUN 32*  CREATININE 2.74*  CALCIUM 8.9    Recent Labs  06/08/13 2156  TROPONINI <0.30    Lab Results  Component Value Date   CHOL 130 12/13/2012   HDL 23* 12/13/2012   LDLCALC 83 12/13/2012   TRIG 122 12/13/2012   CHOLHDL 5.7 12/13/2012   Lab Results  Component Value Date   HGBA1C 5.6 12/13/2012         Studies/Results: Dg Chest  Port 1 View  06/08/2013   CLINICAL DATA:  Chest pain. Current history of hypertension, diabetes and chronic kidney disease. Prior MI.  EXAM: PORTABLE CHEST - 1 VIEW  COMPARISON:  Portable examinations 12/12/2012. Two-view chest x-ray 09/14/2012, 03/11/2012.  FINDINGS: Cardiac silhouette mildly to moderately enlarged for the AP portable technique, increased in size since the July, 2014 examination. Moderate diffuse interstitial pulmonary edema. Asymmetric airspace pulmonary edema in the right perihilar region. No visible pleural effusions.  IMPRESSION: Mild CHF, with mild to moderate cardiomegaly and moderate diffuse interstitial pulmonary edema. Asymmetric airspace edema in the right perihilar region. Interval increase in heart size since July, 2014.   Electronically Signed   By: Evangeline Dakin M.D.   On: 06/08/2013 21:55    Medications: I have reviewed the patient's current medications. Scheduled Meds: . amLODipine  10 mg Oral Daily  . [START ON 06/10/2013] aspirin EC  81 mg Oral Daily  . atorvastatin  20 mg Oral q1800  . ezetimibe  10 mg Oral q morning - 10a  . insulin aspart  0-15 Units Subcutaneous TID WC  . insulin glargine  30 Units Subcutaneous QHS  . metoprolol tartrate  50 mg Oral BID  . pantoprazole  40 mg Oral Daily  . tamsulosin  0.4 mg Oral Daily   Continuous Infusions: . heparin 1,250 Units/hr (  06/09/13 0300)  . nitroGLYCERIN 35 mcg/min (06/09/13 0027)   PRN Meds:.  Assessment/Plan: Principal Problem:   NSTEMI (non-ST elevated myocardial infarction) Active Problems:   CARCINOMA, BLADDER   DYSLIPIDEMIA   HYPERTENSION   ATHEROSCLEROTIC CARDIOVASCULAR DISEASE   DM (diabetes mellitus), type 2 with renal complications   Abnormal nuclear stress test, 06/06/13, mod inf. lat ischemia  PLAN: 1st troponin neg. Second pending, currently stable on IV NTG and Heparin. Pt was preparing for Graft placement for dialysis.  MD to see, possible cardiac cath this admit.  Awaiting second  troponin.  Continue hep.NTG for now.  Addendum:  Troponin now >20.  Made pt NPO.  LOS: 1 day   Time spent with pt. :15 minutes. [facility] R  Nurse Practitioner Certified Pager [phone] or after 5pm and on weekends call [phone] 06/09/2013, 7:31 AM   History and all data above reviewed.  Patient examined.  I agree with the findings as above. No further chest pain.  However, positive enzymes.   The patient exam reveals COR:RRR, no rub  ,  Lungs: Clear  ,  Abd: Positive bowel sounds, no rebound no guarding, Ext No edema  .  All available labs, radiology testing, previous records reviewed. Agree with documented assessment and plan. NQWMI:  Cath today.  The patient understands that risks included but are not limited to stroke (1 in 1000), death (1 in 16), kidney failure [usually temporary] (1 in 500), bleeding (1 in 200), allergic reaction [possibly serious] (1 in 200).  The patient understands and agrees to proceed.   Lean toward BMS as he will need AV fistula placement at some point.  CKD:  We will call renal.    Minus Breeding  9:16 AM  06/09/2013

## 2013-06-09 NOTE — ED Provider Notes (Signed)
Pt received in transfer from [facility].  Cardiology to see.  CP this evening, similar to prior MI, has h/o 3 stents.  EKG with ST depressions diffusely, worse on repeat here at [facility].  EKG Interpretation    Date/Time:  Friday June 09 2013 00:03:17 EST Ventricular Rate:  99 PR Interval:  265 QRS Duration: 130 QT Interval:  380 QTC Calculation: 488 R Axis:   -12 Text Interpretation:  Sinus or ectopic atrial rhythm Prolonged PR interval Right bundle branch block Repol abnrm suggests ischemia, diffuse leads worsening st depression diffusely, elevation in avr Confirmed by Matilynn Dacey  MD, Dayan Desa ([id number]) on 06/09/2013 12:12:48 AM           Pt has been started on ntg and heparin.  Kalman Drape, MD 06/09/13 [phone]

## 2013-06-09 NOTE — Care Management Note (Signed)
    Page 1 of 1   06/09/2013     8:20:18 AM   CARE MANAGEMENT NOTE 06/09/2013  Patient:  Craig Castillo, Craig Castillo   Account Number:  [id number]  Date Initiated:  06/09/2013  Documentation initiated by:  Elissa Hefty  Subjective/Objective Assessment:   adm w mi     Action/Plan:   lives w wife, pcp dr Purcell Nails fusco   Anticipated DC Date:     Anticipated DC Plan:           Choice offered to / List presented to:             Status of service:   Medicare Important Message given?   (If response is "NO", the following Medicare IM given date fields will be blank) Date Medicare IM given:   Date Additional Medicare IM given:    Discharge Disposition:    Per UR Regulation:  Reviewed for med. necessity/level of care/duration of stay  If discussed at [place] of Stay Meetings, dates discussed:    Comments:

## 2013-06-09 NOTE — Consult Note (Signed)
I have seen and examined this patient and agree with the plan of care  . High risk of contrast induced nephropathy although appears well hydrated, recommend minimizing contrast exposure  Kylani Wires W 06/09/2013, 9:49 PM

## 2013-06-09 NOTE — Consult Note (Signed)
[facility] CONSULT NOTE    Date: 06/09/2013                  Patient Name:  Craig Castillo  MRN: [id number]  DOB: 1933-04-06  Age / Sex: 80 y.o., male         PCP: Glo Herring., MD                 Service Requesting Consult: Cardiology                 Reason for Consult: CKD, Patient in need of Cardiac cath            History of Present Illness: Pt is a 80 y.o. yo male with a PMHX of CAD s/p PCI (remotely), HTN, HLD, Stroke, CKD stage III-IV, hx of bladder cancer was admitted to [facility] on 06/08/2013 from [facility] with chest pain.  Patient was scheduled to see Vascular on 1/16 for discussion on AV fistula, but developed severe chest pain and went to the [facility] ED for evaluation. EKG revealed anterolateral ST depression and RBBB concerning for ischemia.  Symptoms persisted despite ASA and SL Nitroglycerin and patient was placed on Nitro gtt and Heparin gtt and transferred to [facility] for possible cardiac catherization.  Patient currently resting comfortably in bed.  No chest pain or SOB at this time.  No reports of PND or Orthopnea.  No complaints currently.  Medications: Outpatient medications: Prescriptions prior to admission  Medication Sig Dispense Refill  . amLODipine (NORVASC) 10 MG tablet Take 10 mg by mouth daily.      Marland Kitchen aspirin EC 81 MG tablet Take 81 mg by mouth daily.      Marland Kitchen ezetimibe (ZETIA) 10 MG tablet Take 10 mg by mouth every morning.       Marland Kitchen gemfibrozil (LOPID) 600 MG tablet Take 600 mg by mouth 2 (two) times daily.       . insulin NPH (HUMULIN N,NOVOLIN N) 100 UNIT/ML injection Inject 15-20 Units into the skin 2 (two) times daily. Take 20 units SQ QAM and 15 units SQ QPM  1 vial  12  . isosorbide mononitrate (IMDUR) 30 MG 24 hr tablet Take 1 tablet (30 mg total) by mouth daily.  30 tablet  6  . metoprolol tartrate (LOPRESSOR) 50 MG tablet Take 1 tablet (50 mg total) by mouth 2 (two) times daily.  60 tablet  6  . nitroGLYCERIN  (NITROSTAT) 0.4 MG SL tablet Place 1 tablet (0.4 mg total) under the tongue as directed. Chest Pain  25 tablet  3  . omeprazole (PRILOSEC) 20 MG capsule Take 20 mg by mouth every morning.       . tamsulosin (FLOMAX) 0.4 MG CAPS capsule Take 0.4 mg by mouth daily.        Current medications: Current Facility-Administered Medications  Medication Dose Route Frequency Provider Last Rate Last Dose  . 0.9 %  sodium chloride infusion  250 mL Intravenous Continuous Cecilie Kicks, NP      . amLODipine (NORVASC) tablet 10 mg  10 mg Oral Daily Cletus Gash, MD      . aspirin chewable tablet 81 mg  81 mg Oral Pre-Cath Cecilie Kicks, NP      . atorvastatin (LIPITOR) tablet 20 mg  20 mg Oral q1800 Cletus Gash, MD      . diazepam (VALIUM) tablet 2 mg  2 mg Oral On Call Cecilie Kicks, NP      .  ezetimibe (ZETIA) tablet 10 mg  10 mg Oral q morning - 10a Cletus Gash, MD      . heparin ADULT infusion 100 units/mL (25000 units/250 mL)  1,500 Units/hr Intravenous Continuous Rande Lawman Rumbarger, RPH   1,500 Units/hr at 06/09/13 [id number]  . insulin aspart (novoLOG) injection 0-15 Units  0-15 Units Subcutaneous TID WC Cletus Gash, MD      . insulin glargine (LANTUS) injection 30 Units  30 Units Subcutaneous QHS Cletus Gash, MD      . metoprolol (LOPRESSOR) tablet 50 mg  50 mg Oral BID Cletus Gash, MD   50 mg at 06/09/13 0456  . nitroGLYCERIN 0.2 mg/mL in dextrose 5 % infusion  10-200 mcg/min Intravenous Titrated [name], MD 10.5 mL/hr at 06/09/13 0800 35 mcg/min at 06/09/13 0800  . pantoprazole (PROTONIX) EC tablet 40 mg  40 mg Oral Daily Cletus Gash, MD      . sodium chloride 0.9 % injection 3 mL  3 mL Intravenous Q12H Cecilie Kicks, NP      . sodium chloride 0.9 % injection 3 mL  3 mL Intravenous PRN Cecilie Kicks, NP      . tamsulosin (FLOMAX) capsule 0.4 mg  0.4 mg Oral Daily Cletus Gash, MD          Allergies: Allergies  Allergen Reactions  . Aspirin     Due  to swelling with celecoxib  . Celecoxib Swelling  . Hydrocodone-Acetaminophen Itching  . Morphine Itching  . Niacin Itching  . Piroxicam Itching      Past Medical History: Past Medical History  Diagnosis Date  . ASCVD (arteriosclerotic cardiovascular disease)   . Dyspnea   . Dyslipidemia   . DJD (degenerative joint disease)   . Hypertension   . Diabetes mellitus   . Myocardial infarction 1997  . BPH (benign prostatic hyperplasia)   . CKD (chronic kidney disease) stage 3, GFR 30-59 ml/min   . Coronary artery disease   . Bladder cancer   . Stroke   . Abnormal nuclear stress test 06/09/2013    Past Surgical History: Past Surgical History  Procedure Laterality Date  . Cholecystectomy    . Esophagogastroduodenoscopy  06/08/2002    Soft stricture of the GE  junction with erosive esophagitis. The stricture was dilated to 58 [place] using a Maloney dilator. Swollen fold at GE junction on the gastric site which was biopsied for histology and was suspicious for sentinel folds.  . Colonoscopy  06/08/2002    Small polyp was oblated via cold biopsy from the cecum and two were snared, one was at the rectosigmoid junction measuring about a centimeter, another one at the rectum which was smaller.  . Transurethral resection of bladder tumor      x7  . Coronary angioplasty  1997    3 stents  . Transurethral resection of bladder tumor  03/15/2012    Procedure: TRANSURETHRAL RESECTION OF BLADDER TUMOR (TURBT);  Surgeon: Marissa Nestle, MD;  Location: [facility];  Service: Urology;  Laterality: N/A;  . Cataract extraction w/phaco  06/20/2012    Procedure: CATARACT EXTRACTION PHACO AND INTRAOCULAR LENS PLACEMENT (IOC);  Surgeon: Tonny Branch, MD;  Location: [facility];  Service: Ophthalmology;  Laterality: Right;  CDE=16.59  . Cataract extraction w/phaco Left 07/04/2012    Procedure: CATARACT EXTRACTION PHACO AND INTRAOCULAR LENS PLACEMENT (IOC);  Surgeon: Tonny Branch, MD;  Location: [facility];  Service:  Ophthalmology;  Laterality: Left;  CDE: 24.33  . Transurethral resection of bladder tumor  N/A 09/29/2012    Procedure: TRANSURETHRAL RESECTION OF BLADDER TUMOR (TURBT);  Surgeon: Marissa Nestle, MD;  Location: [facility];  Service: Urology;  Laterality: N/A;  . Multiple tooth extractions    . Cystoscopy with biopsy N/A 04/27/2013    Procedure: CYSTOSCOPY WITH BLADDER BIOPSY & FULGURATION;  Surgeon: Marissa Nestle, MD;  Location: [facility];  Service: Urology;  Laterality: N/A;     Family History: Family History  Problem Relation Age of Onset  . Stroke Mother 50  . Diabetes Brother 38   Social History: History   Social History  . Marital Status: Married    Spouse Name: N/A    Number of Children: N/A  . Years of Education: N/A   Occupational History  . Retired    Social History Main Topics  . Smoking status: Former Smoker -- 0.50 packs/day for 20 years    Types: Cigarettes    Quit date: 03/08/1981  . Smokeless tobacco: Never Used  . Alcohol Use: No  . Drug Use: No  . Sexual Activity: No   Other Topics Concern  . Not on file   Social History Narrative   Married   No regular exercise   Retired Armed forces training and education officer            Review of Systems: As per HPI  Vital Signs: Blood pressure 100/81, pulse 62, temperature 97.9 F (36.6 C), temperature source Oral, resp. rate 17, height 5\' 8"  (1.727 m), weight 218 lb 14.7 oz (99.3 kg), SpO2 98.00%.  Weight trends: Filed Weights   06/08/13 2358 06/09/13 0456 06/09/13 1040  Weight: 220 lb (99.791 kg) 219 lb 2.2 oz (99.4 kg) 218 lb 14.7 oz (99.3 kg)    Physical Exam: General: Vital signs reviewed and noted. Well-developed, well-nourished, in no acute distress; alert, appropriate and cooperative throughout examination.  Head: Normocephalic, atraumatic.  Eyes: No scleral icterus noted.    Nose: Mucous membranes moist  Neck: Supple.   Lungs:  Normal respiratory effort.  No rales, rhonchi, or wheezing appreciated.   Heart: RRR.  S1 and S2 normal without gallop, murmur, or rubs.  Abdomen:  Soft, nontender.  Protuberant abdomen.  Extremities: 1+ pitting LE pretibial edema.  Neurologic: A&O X3 No focal deficits.  Skin: Warm, Dry intact.     Lab results: Basic Metabolic Panel:  Recent Labs Lab 06/08/13 2156 06/09/13 0720  NA 143 145  K 4.1 4.2  CL 108 112  CO2 20 19  GLUCOSE 185* 93  BUN 32* 31*  CREATININE 2.74* 2.63*  CALCIUM 8.9 8.6   CBC:  Recent Labs Lab 06/08/13 2156 06/09/13 0720  WBC 8.4 7.6  NEUTROABS 6.8  --   HGB 11.7* 10.4*  HCT 33.9* 30.9*  MCV 92.4 90.9  PLT 202 197    Cardiac Enzymes:  Recent Labs Lab 06/08/13 2156 06/09/13 0720  TROPONINI <0.30 >20.00*    BNP: No components found with this basename: POCBNP,   CBG:  Recent Labs Lab 06/09/13 0742  GLUCAP 85    Microbiology: Results for orders placed during the hospital encounter of 06/08/13  MRSA PCR SCREENING     Status: None   Collection Time    06/09/13  3:20 AM      Result Value Range Status   MRSA by PCR NEGATIVE  NEGATIVE Final   Comment:            The GeneXpert MRSA Assay (FDA     approved for NASAL specimens     only),  is one component of a     comprehensive MRSA colonization     surveillance program. It is not     intended to diagnose MRSA     infection nor to guide or     monitor treatment for     MRSA infections.    Coagulation Studies:  Recent Labs  06/08/13 2156  LABPROT 12.5  INR 0.95    Urinalysis: No results found for this basename: COLORURINE, APPERANCEUR, LABSPEC, PHURINE, GLUCOSEU, HGBUR, BILIRUBINUR, KETONESUR, PROTEINUR, UROBILINOGEN, NITRITE, LEUKOCYTESUR,  in the last 72 hours   Imaging: Dg Chest Port 1 View  06/08/2013   CLINICAL DATA:  Chest pain. Current history of hypertension, diabetes and chronic kidney disease. Prior MI.  EXAM: PORTABLE CHEST - 1 VIEW  COMPARISON:  Portable examinations 12/12/2012. Two-view chest x-ray 09/14/2012, 03/11/2012.  FINDINGS: Cardiac  silhouette mildly to moderately enlarged for the [facility] portable technique, increased in size since the July, 2014 examination. Moderate diffuse interstitial pulmonary edema. Asymmetric airspace pulmonary edema in the right perihilar region. No visible pleural effusions.  IMPRESSION: Mild CHF, with mild to moderate cardiomegaly and moderate diffuse interstitial pulmonary edema. Asymmetric airspace edema in the right perihilar region. Interval increase in heart size since July, 2014.   Electronically Signed   By: Evangeline Dakin M.D.   On: 06/08/2013 21:55      Assessment: Pt is a 80 y.o. yo male with a PMHX of CAD s/p PCI (remotely), HTN, HLD, Stroke, CKD stage III-IV, hx of bladder cancer was admitted to [facility] on 06/08/2013 from [facility] with chest pain and concern for NSTEMI. Nephrology consulted due to CKD and to follow after Cardiac cath (for possible contrast induced nephropathy).  1) NSTEMI 2) CAD s/p PCI 3) CKD stage IV 4) HTN 5) DM-2 6) Hx of Bladder Cancer 7) CHF exacerbation   Plan: 1) NSTEMI in setting of CKD IV- Cardiology managing and patient in need of Cardiac cath.   - Agree with IVF fluids pre-cath (100 mL/hr) - Daily Renal panel, UA ordered.  No need for Renal US as patient had [name] in December 2014. - Avoid additional nephrotoxic agents - Patient will likely need AV fistula and HD during admission - Will follow along closely  2) HTN  - Per Cardiology  3) CHF exacerbation - Holding diuresis at this time given need for additional volume prior to cath - See above.  4) Remainder per primary team.  [place] PGY-2

## 2013-06-09 NOTE — Progress Notes (Signed)
  Echocardiogram 2D Echocardiogram has been performed.  Mauricio Po 06/09/2013, 11:17 AM

## 2013-06-09 NOTE — Progress Notes (Signed)
Panic lab value called from lab at 0815.  Troponin >20.00.  Paged Cecilie Kicks, NP at [phone] and call was returned immediately.  She will inform Dr Ula Lingo of this information so that the patient can be see soon.  Order given to make NPO at this time in prep for cath lab procedure.

## 2013-06-09 NOTE — Progress Notes (Signed)
ANTICOAGULATION CONSULT NOTE - Follow-up  Pharmacy Consult for Heparin Indication: chest pain/ACS   Patient Measurements: Height: 5\' 8"  (172.7 cm) Weight: 218 lb 14.7 oz (99.3 kg) IBW/kg (Calculated) : 68.4 Heparin Dosing Weight: 91 kg  Vital Signs: Temp: 97.9 F (36.6 C) (01/16 1730) Temp src: Oral (01/16 1730) BP: 172/71 mmHg (01/16 1730) Pulse Rate: 93 (01/16 1730)  Labs:  Recent Labs  06/08/13 2156 06/09/13 0720 06/09/13 1419  HGB 11.7* 10.4*  --   HCT 33.9* 30.9*  --   PLT 202 197  --   APTT 30  --   --   LABPROT 12.5  --   --   INR 0.95  --   --   HEPARINUNFRC  --  0.10*  --   CREATININE 2.74* 2.63*  --   TROPONINI <0.30 >20.00* >20.00*    Estimated Creatinine Clearance: 26.5 ml/min (by C-G formula based on Cr of 2.63).  Assessment: Craig Castillo presented to the hospital with chest pain and was started on IV heparin. Initial heparin level is low at 0.1. H/H is down slightly but plts remain WNL. Troponin is >20 today. Had cardiac cath today showing 2 vessel CAD.  Will need PCI 1/19 of LCx and probably medical therapy for RCA disease.  Goal of Therapy:  Heparin level 0.3-0.7 units/ml Monitor platelets by anticoagulation protocol: Yes   Plan:   Resume heparin on 1/17 - 0100 - at 1500 units/hr  Heparin level 8 hours later and adjust per goal  Daily CBC while on heparin  Heide Guile, PharmD, [facility] Clinical Pharmacist Pager [phone]   06/09/2013 6:55 PM

## 2013-06-09 NOTE — ED Notes (Signed)
Dr Otter at bedside  

## 2013-06-10 ENCOUNTER — Other Ambulatory Visit: Payer: Self-pay

## 2013-06-10 LAB — GLUCOSE, CAPILLARY
GLUCOSE-CAPILLARY: 126 mg/dL — AB (ref 70–99)
Glucose-Capillary: 99 mg/dL (ref 70–99)
Glucose-Capillary: 124 mg/dL — ABNORMAL HIGH (ref 70–99)
Glucose-Capillary: 119 mg/dL — ABNORMAL HIGH (ref 70–99)

## 2013-06-10 LAB — HEPARIN LEVEL (UNFRACTIONATED)
HEPARIN UNFRACTIONATED: 0.22 [IU]/mL — AB (ref 0.30–0.70)
Heparin Unfractionated: 0.38 IU/mL (ref 0.30–0.70)

## 2013-06-10 LAB — RENAL FUNCTION PANEL
Albumin: 2.9 g/dL — ABNORMAL LOW (ref 3.5–5.2)
BUN: 35 mg/dL — ABNORMAL HIGH (ref 6–23)
CALCIUM: 8.5 mg/dL (ref 8.4–10.5)
CO2: 19 meq/L (ref 19–32)
CREATININE: 2.68 mg/dL — AB (ref 0.50–1.35)
Chloride: 112 mEq/L (ref 96–112)
GFR calc non Af Amer: 21 mL/min — ABNORMAL LOW (ref >90)
GFR, EST AFRICAN AMERICAN: 25 mL/min — AB (ref >90)
GLUCOSE: 103 mg/dL — AB (ref 70–99)
PHOSPHORUS: 3.4 mg/dL (ref 2.3–4.6)
Potassium: 4.2 mEq/L (ref 3.7–5.3)
Sodium: 144 mEq/L (ref 137–147)

## 2013-06-10 LAB — CBC
HCT: 30.8 % — ABNORMAL LOW (ref 39.0–52.0)
HEMOGLOBIN: 10 g/dL — AB (ref 13.0–17.0)
MCH: 30.2 pg (ref 26.0–34.0)
MCHC: 32.5 g/dL (ref 30.0–36.0)
MCV: 93.1 fL (ref 78.0–100.0)
Platelets: 189 10*3/uL (ref 150–400)
RBC: 3.31 MIL/uL — ABNORMAL LOW (ref 4.22–5.81)
RDW: 14.5 % (ref 11.5–15.5)
WBC: 7.2 10*3/uL (ref 4.0–10.5)

## 2013-06-10 MED ORDER — HEPARIN (PORCINE) IN NACL 100-0.45 UNIT/ML-% IJ SOLN
1900.0000 [IU]/h | INTRAMUSCULAR | Status: DC
  Administered 2013-06-10 – 2013-06-11 (×2): 1800 [IU]/h via INTRAVENOUS
  Administered 2013-06-12 (×2): 1900 [IU]/h via INTRAVENOUS
  Filled 2013-06-10 (×5): qty 250

## 2013-06-10 NOTE — Progress Notes (Signed)
[facility] Progress Note   Subjective:   Feeling well this am.  No Chest pain/SOB.   Objective:   BP 152/81  Pulse 78  Temp(Src) 98.5 F (36.9 C) (Oral)  Resp 18  Ht 5\' 8"  (1.727 m)  Wt 219 lb 12.8 oz (99.7 kg)  BMI 33.43 kg/m2  SpO2 97%  Intake/Output Summary (Last 24 hours) at 06/10/13 0656 Last data filed at 06/10/13 0500  Gross per 24 hour  Intake 1423.87 ml  Output   1875 ml  Net -451.13 ml   Weight change: -1 lb 1.3 oz (-0.491 kg)  Physical Exam: Exam: General: well appearing, NAD. Cardiovascular: RRR. No murmurs, rubs, or gallops. Respiratory: CTAB. No rales, rhonchi, or wheeze. Abdomen: soft, nontender, nondistended. Extremities: Trace to 1+ LE edema.  Skin: Warm, dry, intact. Neuro: No focal deficits.  Imaging: Dg Chest Port 1 View  06/08/2013   CLINICAL DATA:  Chest pain. Current history of hypertension, diabetes and chronic kidney disease. Prior MI.  EXAM: PORTABLE CHEST - 1 VIEW  COMPARISON:  Portable examinations 12/12/2012. Two-view chest x-ray 09/14/2012, 03/11/2012.  FINDINGS: Cardiac silhouette mildly to moderately enlarged for the AP portable technique, increased in size since the July, 2014 examination. Moderate diffuse interstitial pulmonary edema. Asymmetric airspace pulmonary edema in the right perihilar region. No visible pleural effusions.  IMPRESSION: Mild CHF, with mild to moderate cardiomegaly and moderate diffuse interstitial pulmonary edema. Asymmetric airspace edema in the right perihilar region. Interval increase in heart size since July, 2014.   Electronically Signed   By: Evangeline Dakin M.D.   On: 06/08/2013 21:55   Labs: BMET  Recent Labs Lab 06/08/13 2156 06/09/13 0720 06/10/13 0420  NA 143 145 144  K 4.1 4.2 4.2  CL 108 112 112  CO2 20 19 19   GLUCOSE 185* 93 103*  BUN 32* 31* 35*  CREATININE 2.74* 2.63* 2.68*  CALCIUM 8.9 8.6 8.5  PHOS  --   --  3.4   CBC  Recent Labs Lab 06/08/13 2156  06/09/13 0720 06/10/13 0420  WBC 8.4 7.6 7.2  NEUTROABS 6.8  --   --   HGB 11.7* 10.4* 10.0*  HCT 33.9* 30.9* 30.8*  MCV 92.4 90.9 93.1  PLT 202 197 189    Medications:    Infusions: . heparin 1,500 Units/hr (06/10/13 0600)  . nitroGLYCERIN 30 mcg/min (06/10/13 0600)   Scheduled Medications: . amLODipine  10 mg Oral Daily  . aspirin  81 mg Oral Daily  . atorvastatin  20 mg Oral q1800  . ezetimibe  10 mg Oral q morning - 10a  . insulin aspart  0-15 Units Subcutaneous TID WC  . insulin glargine  30 Units Subcutaneous QHS  . metoprolol tartrate  50 mg Oral BID  . pantoprazole  40 mg Oral Daily  . tamsulosin  0.4 mg Oral Daily  . Ticagrelor  90 mg Oral BID   Assessment:    Pt is a 80 y.o. yo male with a PMHX of CAD s/p PCI (remotely), HTN, HLD, Stroke, CKD stage III-IV, hx of bladder cancer was admitted to [facility] on 06/08/2013 from [facility] with chest pain and concern for NSTEMI.  Nephrology consulted due to CKD and to follow after Cardiac cath (for possible contrast induced nephropathy).   1) NSTEMI 2) CAD s/p PCI (1997) 3) CKD stage IV  4) HTN  5) DM-2  6) Hx of Bladder Cancer  7) CHF exacerbation   Plan:    1) NSTEMI in  setting of CKD IV - s/p Cardiac cath on 06/09/13 which revealed diffuse RCA disease and proximal stenosis of the LCx.  Only 40 mL of contrast used during procedure. - Creatinine stable this am at 2.68.  No indication for dialysis currently.  - Will continue daily Renal panel to monitor renal function closely.  - Patient to undergo PCI on Monday - We will see again on Monday.  2) HTN  - Mildly elevated this am. - Continue Amlodipine, metoprolol per Cardiology - Avoid ACEI/ARB  3) CHF exacerbation  - Per Cardiology  4) Remainder per primary team.  Please see also attending cosign for edits / additions.  Coral Spikes, DO PGY-2, [facility] 06/10/2013, 6:56 AM I have seen and examined this patient and agree with the  plan of care . Will see patient again on Monday.  Latosha Gaylord W 06/10/2013, 11:06 AM

## 2013-06-10 NOTE — Progress Notes (Signed)
CARDIAC REHAB PHASE I   PRE:  Rate/Rhythm: 77 sinus rhythm  BP:  Supine:   Sitting: 149/66  Standing:    SaO2: 97% ra  MODE:  Ambulation: 200 ft   POST:  Rate/Rhythem: 97 sinus rhythm, occ PVC  BP:  Supine:   Sitting: 155/70  Standing:    SaO2: 98% ra  [phone] Pt ambulated in hallway x1 assist, very  Slow yet  steady gait.  Pt returned to chair, call light in reach.  Pt given MI booklet and information about watching MI video.    [place], [place]

## 2013-06-10 NOTE — Progress Notes (Signed)
Subjective: No complaints  Objective: Vital signs in last 24 hours: Temp:  [97.4 F (36.3 C)-98.5 F (36.9 C)] 98.5 F (36.9 C) (01/17 0510) Pulse Rate:  [62-95] 78 (01/16 2100) Resp:  [17-18] 18 (01/17 0510) BP: (100-172)/(55-87) 152/81 mmHg (01/17 0625) SpO2:  [95 %-98 %] 97 % (01/17 0625) Weight:  [218 lb 14.7 oz (99.3 kg)-219 lb 12.8 oz (99.7 kg)] 219 lb 12.8 oz (99.7 kg) (01/17 0510) Last BM Date: 06/08/13  Intake/Output from previous day: 01/16 0701 - 01/17 0700 In: 1423.9 [I.V.:1423.9] Out: 1875 [Urine:1875] Intake/Output this shift:    Medications Current Facility-Administered Medications  Medication Dose Route Frequency Provider Last Rate Last Dose  . amLODipine (NORVASC) tablet 10 mg  10 mg Oral Daily Cletus Gash, MD   10 mg at 06/09/13 1419  . aspirin chewable tablet 81 mg  81 mg Oral Daily Peter M Martinique, MD      . atorvastatin (LIPITOR) tablet 20 mg  20 mg Oral q1800 Cletus Gash, MD   20 mg at 06/09/13 2220  . ezetimibe (ZETIA) tablet 10 mg  10 mg Oral q morning - 10a Cletus Gash, MD   10 mg at 06/09/13 1419  . heparin ADULT infusion 100 units/mL (25000 units/250 mL)  1,500 Units/hr Intravenous Continuous Arnoldo Lenis, MD 15 mL/hr at 06/10/13 0600 1,500 Units/hr at 06/10/13 0600  . insulin aspart (novoLOG) injection 0-15 Units  0-15 Units Subcutaneous TID WC Cletus Gash, MD      . insulin glargine (LANTUS) injection 30 Units  30 Units Subcutaneous QHS Cletus Gash, MD   30 Units at 06/09/13 2220  . metoprolol (LOPRESSOR) tablet 50 mg  50 mg Oral BID Cletus Gash, MD   50 mg at 06/09/13 2218  . nitroGLYCERIN 0.2 mg/mL in dextrose 5 % infusion  10-200 mcg/min Intravenous Titrated [name], MD 9 mL/hr at 06/10/13 0600 30 mcg/min at 06/10/13 0600  . pantoprazole (PROTONIX) EC tablet 40 mg  40 mg Oral Daily Cletus Gash, MD   40 mg at 06/09/13 1426  . tamsulosin (FLOMAX) capsule 0.4 mg  0.4 mg Oral Daily Cletus Gash, MD   0.4 mg at 06/09/13 1419  . Ticagrelor (BRILINTA) tablet 90 mg  90 mg Oral BID Peter M Martinique, MD   90 mg at 06/09/13 2219    PE: General appearance: alert, cooperative and no distress Lungs: clear to auscultation bilaterally Heart: regular rate and rhythm, S1, S2 normal, no murmur, click, rub or gallop Extremities: No LEE Pulses: 2+ and symmetric Skin:  Warm and dry.  Right groin:  Nontender, no ecchymosis. Neurologic: Grossly normal  Lab Results:   Recent Labs  06/08/13 2156 06/09/13 0720 06/10/13 0420  WBC 8.4 7.6 7.2  HGB 11.7* 10.4* 10.0*  HCT 33.9* 30.9* 30.8*  PLT 202 197 189   BMET  Recent Labs  06/08/13 2156 06/09/13 0720 06/10/13 0420  NA 143 145 144  K 4.1 4.2 4.2  CL 108 112 112  CO2 20 19 19   GLUCOSE 185* 93 103*  BUN 32* 31* 35*  CREATININE 2.74* 2.63* 2.68*  CALCIUM 8.9 8.6 8.5   PT/INR  Recent Labs  06/08/13 2156  LABPROT 12.5  INR 0.95      Assessment/Plan  Principal Problem:   NSTEMI (non-ST elevated myocardial infarction) Active Problems:   CARCINOMA, BLADDER   DYSLIPIDEMIA   HYPERTENSION   ATHEROSCLEROTIC CARDIOVASCULAR DISEASE   DM (diabetes mellitus), type 2 with renal complications   Abnormal nuclear  stress test, 06/06/13, mod inf. lat ischemia  Plan: 80 yo WM with history of CAD s/p remote stents in 1997 present with NSTEMI. He has CKD stage 4.  SP LHC revealing 2 vessel obstructive CAD.  Culprit in the LCx.  The RCA has extensive stenting with moderate diffuse in stent disease and a focal area of 90% stenosis at the crux.   PCI for Monday with rotational atherectomy. Loaded with Brilinta.   BP labile-100/81 - 172/71.  Continue to monitor.  Transfer to telemetry.    LOS: 2 days    HAGER, BRYAN 06/10/2013 7:31 AM  History and all data above reviewed.  Patient examined.  I agree with the findings as above. He had slight SOB last night.  No chest pain The patient exam reveals COR:RRR  ,  Lungs: Clear  ,  Abd:  Positive bowel sounds, no rebound no guarding, Ext Right groin without bruising or bleeding.   .  All available labs, radiology testing, previous records reviewed. Agree with documented assessment and plan. CAD as above.  For PCI on Monday.  CKD:  Creat is stable.  Continue to follow.    Zyann Mabry  8:00 AM  06/10/2013

## 2013-06-10 NOTE — Progress Notes (Signed)
ANTICOAGULATION CONSULT NOTE - Follow-up  Pharmacy Consult for Heparin Indication: chest pain/ACS   Patient Measurements: Height: 5\' 8"  (172.7 cm) Weight: 219 lb 12.8 oz (99.7 kg) IBW/kg (Calculated) : 68.4 Heparin Dosing Weight: 91 kg  Vital Signs: Temp: 98.5 F (36.9 C) (01/17 1423) Temp src: Oral (01/17 1423) BP: 118/57 mmHg (01/17 1423) Pulse Rate: 68 (01/17 1423)  Labs:  Recent Labs  06/08/13 2156 06/09/13 0720 06/09/13 1419 06/09/13 1935 06/10/13 0420 06/10/13 0830 06/10/13 1740  HGB 11.7* 10.4*  --   --  10.0*  --   --   HCT 33.9* 30.9*  --   --  30.8*  --   --   PLT 202 197  --   --  189  --   --   APTT 30  --   --   --   --   --   --   LABPROT 12.5  --   --   --   --   --   --   INR 0.95  --   --   --   --   --   --   HEPARINUNFRC  --  0.10*  --   --   --  0.22* 0.38  CREATININE 2.74* 2.63*  --   --  2.68*  --   --   TROPONINI <0.30 >20.00* >20.00* 14.82*  --   --   --     Estimated Creatinine Clearance: 26 ml/min (by C-G formula based on Cr of 2.68).  Assessment: 48 yom with chest pain on IV heparin. Had cardiac cath on 1/16 showing 2 vessel CAD. Will need PCI 1/19 of LCx and probably medical therapy for RCA disease.  Heparin level now therapeutic on 1800 units/hr. No bleeding noted.  Goal of Therapy:  Heparin level 0.3-0.7 units/ml Monitor platelets by anticoagulation protocol: Yes   Plan:  1. Continue heparin at 1800 units/hr 2. Next heparin level with AM labs 3. Daily heparin level and CBC  Amonie Wisser D. Jeovany Huitron, PharmD, BCPS Clinical Pharmacist Pager: [phone] 06/10/2013 6:41 PM

## 2013-06-10 NOTE — Progress Notes (Signed)
ANTICOAGULATION CONSULT NOTE - Follow-up  Pharmacy Consult for Heparin Indication: chest pain/ACS   Patient Measurements: Height: 5\' 8"  (172.7 cm) Weight: 219 lb 12.8 oz (99.7 kg) IBW/kg (Calculated) : 68.4 Heparin Dosing Weight: 91 kg  Vital Signs: Temp: 98 F (36.7 C) (01/17 0746) Temp src: Oral (01/17 0746) BP: 171/72 mmHg (01/17 0800) Pulse Rate: 70 (01/17 0746)  Labs:  Recent Labs  06/08/13 2156 06/09/13 0720 06/09/13 1419 06/09/13 1935 06/10/13 0420 06/10/13 0830  HGB 11.7* 10.4*  --   --  10.0*  --   HCT 33.9* 30.9*  --   --  30.8*  --   PLT 202 197  --   --  189  --   APTT 30  --   --   --   --   --   LABPROT 12.5  --   --   --   --   --   INR 0.95  --   --   --   --   --   HEPARINUNFRC  --  0.10*  --   --   --  0.22*  CREATININE 2.74* 2.63*  --   --  2.68*  --   TROPONINI <0.30 >20.00* >20.00* 14.82*  --   --     Estimated Creatinine Clearance: 26 ml/min (by C-G formula based on Cr of 2.68).  Assessment: 52 yom presented to the hospital with chest pain and was started on IV heparin. Initial heparin level is low at 0.1. H/H is down slightly but plts remain WNL. Troponin is >20 today. Had cardiac cath on 1/16 showing 2 vessel CAD.  Will need PCI 1/19 of LCx and probably medical therapy for RCA disease.  Heparin level still subtherapeutic and this AM is 0.22.  Will increase by ~3 units/kg/hr and recheck HL.   Goal of Therapy:  Heparin level 0.3-0.7 units/ml Monitor platelets by anticoagulation protocol: Yes   Plan:   Inc heparin to 1800 units/hr  Heparin level 8 hours later and adjust per goal  Daily CBC while on heparin  Jeronimo Norma, PharmD Clinical Pharmacist Resident Pager: [phone]    06/10/2013 9:48 AM

## 2013-06-11 LAB — RENAL FUNCTION PANEL
Albumin: 2.9 g/dL — ABNORMAL LOW (ref 3.5–5.2)
BUN: 40 mg/dL — ABNORMAL HIGH (ref 6–23)
CALCIUM: 8.6 mg/dL (ref 8.4–10.5)
CHLORIDE: 109 meq/L (ref 96–112)
CO2: 19 mEq/L (ref 19–32)
Creatinine, Ser: 3.01 mg/dL — ABNORMAL HIGH (ref 0.50–1.35)
GFR calc Af Amer: 21 mL/min — ABNORMAL LOW (ref >90)
GFR calc non Af Amer: 18 mL/min — ABNORMAL LOW (ref >90)
GLUCOSE: 52 mg/dL — AB (ref 70–99)
Phosphorus: 4 mg/dL (ref 2.3–4.6)
Potassium: 4.1 mEq/L (ref 3.7–5.3)
Sodium: 144 mEq/L (ref 137–147)

## 2013-06-11 LAB — CBC
HCT: 28.8 % — ABNORMAL LOW (ref 39.0–52.0)
HEMOGLOBIN: 9.7 g/dL — AB (ref 13.0–17.0)
MCH: 30.5 pg (ref 26.0–34.0)
MCHC: 33.7 g/dL (ref 30.0–36.0)
MCV: 90.6 fL (ref 78.0–100.0)
Platelets: 181 10*3/uL (ref 150–400)
RBC: 3.18 MIL/uL — ABNORMAL LOW (ref 4.22–5.81)
RDW: 14.2 % (ref 11.5–15.5)
WBC: 7.1 10*3/uL (ref 4.0–10.5)

## 2013-06-11 LAB — GLUCOSE, CAPILLARY
GLUCOSE-CAPILLARY: 138 mg/dL — AB (ref 70–99)
Glucose-Capillary: 47 mg/dL — ABNORMAL LOW (ref 70–99)
Glucose-Capillary: 147 mg/dL — ABNORMAL HIGH (ref 70–99)
Glucose-Capillary: 117 mg/dL — ABNORMAL HIGH (ref 70–99)
Glucose-Capillary: 151 mg/dL — ABNORMAL HIGH (ref 70–99)

## 2013-06-11 LAB — HEPARIN LEVEL (UNFRACTIONATED): Heparin Unfractionated: 0.44 IU/mL (ref 0.30–0.70)

## 2013-06-11 MED ORDER — SODIUM CHLORIDE 0.9 % IV SOLN: 250.0000 mL | INTRAVENOUS | Status: DC | PRN

## 2013-06-11 MED ORDER — SODIUM CHLORIDE 0.9 % IJ SOLN
3.0000 mL | Freq: Two times a day (BID) | INTRAMUSCULAR | Status: DC
  Administered 2013-06-11: 3 mL via INTRAVENOUS

## 2013-06-11 MED ORDER — SODIUM CHLORIDE 0.9 % IV SOLN
INTRAVENOUS | Status: DC
  Administered 2013-06-11: 22:00:00 via INTRAVENOUS

## 2013-06-11 MED ORDER — SODIUM CHLORIDE 0.9 % IJ SOLN: 3.0000 mL | INTRAMUSCULAR | Status: DC | PRN

## 2013-06-11 NOTE — Progress Notes (Signed)
SUBJECTIVE:  No chest pain.  No SOB.   PHYSICAL EXAM Filed Vitals:   06/10/13 2120 06/10/13 2136 06/11/13 0549 06/11/13 0819  BP: 136/65 124/76 122/43   Pulse: 76 80 70   Temp: 98.6 F (37 C)  98.6 F (37 C)   TempSrc: Oral  Oral   Resp: 15  15   Height:      Weight:    220 lb 0.3 oz (99.8 kg)  SpO2: 97%  96%    General:  No distress Lungs:  Clear Heart:  RRR Abdomen:  Positive bowel sounds, no rebound no guarding Extremities:  Mild edema  LABS:  Results for orders placed during the hospital encounter of 06/08/13 (from the past 24 hour(s))  GLUCOSE, CAPILLARY     Status: Abnormal   Collection Time    06/10/13 11:42 AM      Result Value Range   Glucose-Capillary 124 (*) 70 - 99 mg/dL   Comment 1 Notify RN    HEPARIN LEVEL (UNFRACTIONATED)     Status: None   Collection Time    06/10/13  5:40 PM      Result Value Range   Heparin Unfractionated 0.38  0.30 - 0.70 IU/mL  GLUCOSE, CAPILLARY     Status: Abnormal   Collection Time    06/10/13  6:03 PM      Result Value Range   Glucose-Capillary 119 (*) 70 - 99 mg/dL   Comment 1 Notify RN    GLUCOSE, CAPILLARY     Status: Abnormal   Collection Time    06/10/13  8:54 PM      Result Value Range   Glucose-Capillary 126 (*) 70 - 99 mg/dL  CBC     Status: Abnormal   Collection Time    06/11/13  5:00 AM      Result Value Range   WBC 7.1  4.0 - 10.5 K/uL   RBC 3.18 (*) 4.22 - 5.81 MIL/uL   Hemoglobin 9.7 (*) 13.0 - 17.0 g/dL   HCT 28.8 (*) 39.0 - 52.0 %   MCV 90.6  78.0 - 100.0 fL   MCH 30.5  26.0 - 34.0 pg   MCHC 33.7  30.0 - 36.0 g/dL   RDW 14.2  11.5 - 15.5 %   Platelets 181  150 - 400 K/uL  HEPARIN LEVEL (UNFRACTIONATED)     Status: None   Collection Time    06/11/13  5:00 AM      Result Value Range   Heparin Unfractionated 0.44  0.30 - 0.70 IU/mL  RENAL FUNCTION PANEL     Status: Abnormal   Collection Time    06/11/13  5:42 AM      Result Value Range   Sodium 144  137 - 147 mEq/L   Potassium 4.1  3.7 -  5.3 mEq/L   Chloride 109  96 - 112 mEq/L   CO2 19  19 - 32 mEq/L   Glucose, Bld 52 (*) 70 - 99 mg/dL   BUN 40 (*) 6 - 23 mg/dL   Creatinine, Ser 3.01 (*) 0.50 - 1.35 mg/dL   Calcium 8.6  8.4 - 10.5 mg/dL   Phosphorus 4.0  2.3 - 4.6 mg/dL   Albumin 2.9 (*) 3.5 - 5.2 g/dL   GFR calc non Af Amer 18 (*) >90 mL/min   GFR calc Af Amer 21 (*) >90 mL/min  GLUCOSE, CAPILLARY     Status: Abnormal   Collection Time    06/11/13  8:05 AM      Result Value Range   Glucose-Capillary 47 (*) 70 - 99 mg/dL   Comment 1 Notify RN    GLUCOSE, CAPILLARY     Status: Abnormal   Collection Time    06/11/13 10:00 AM      Result Value Range   Glucose-Capillary 138 (*) 70 - 99 mg/dL   Comment 1 Notify RN      Intake/Output Summary (Last 24 hours) at 06/11/13 1108 Last data filed at 06/11/13 0800  Gross per 24 hour  Intake    540 ml  Output    700 ml  Net   -160 ml    ASSESSMENT AND PLAN:  NSTEMI:  On for PCI in the AM pending the creat.    HTN:  BP OK.    CKD:  Creat is elevated.  Need to assess in AM before possible PCI.  DM:  Continue current therapy.     Jeneen Rinks [facility] 06/11/2013 11:08 AM

## 2013-06-11 NOTE — Progress Notes (Signed)
ANTICOAGULATION CONSULT NOTE - Follow-up  Pharmacy Consult for Heparin Indication: chest pain/ACS   Patient Measurements: Height: 5\' 8"  (172.7 cm) Weight: 220 lb 0.3 oz (99.8 kg) IBW/kg (Calculated) : 68.4 Heparin Dosing Weight: 91 kg  Vital Signs: Temp: 98.6 F (37 C) (01/18 0549) Temp src: Oral (01/18 0549) BP: 122/43 mmHg (01/18 0549) Pulse Rate: 70 (01/18 0549)  Labs:  Recent Labs  06/08/13 2156  06/09/13 0720 06/09/13 1419 06/09/13 1935 06/10/13 0420 06/10/13 0830 06/10/13 1740 06/11/13 0500 06/11/13 0542  HGB 11.7*  --  10.4*  --   --  10.0*  --   --  9.7*  --   HCT 33.9*  --  30.9*  --   --  30.8*  --   --  28.8*  --   PLT 202  --  197  --   --  189  --   --  181  --   APTT 30  --   --   --   --   --   --   --   --   --   LABPROT 12.5  --   --   --   --   --   --   --   --   --   INR 0.95  --   --   --   --   --   --   --   --   --   HEPARINUNFRC  --   < > 0.10*  --   --   --  0.22* 0.38 0.44  --   CREATININE 2.74*  --  2.63*  --   --  2.68*  --   --   --  3.01*  TROPONINI <0.30  --  >20.00* >20.00* 14.82*  --   --   --   --   --   < > = values in this interval not displayed.  Estimated Creatinine Clearance: 23.2 ml/min (by C-G formula based on Cr of 3.01).  Assessment: 89 yom with chest pain on IV heparin. Had cardiac cath on 1/16 showing 2 vessel CAD. Will need PCI 1/19 of LCx and probably medical therapy for RCA disease.  Heparin level therapeutic.   Goal of Therapy:  Heparin level 0.3-0.7 units/ml Monitor platelets by anticoagulation protocol: Yes   Plan:   Continue heparin at 1800 units/hr Daily heparin level and CBC  Onnie Boer, PharmD Pager: [phone] 06/11/2013 12:27 PM

## 2013-06-12 ENCOUNTER — Encounter (HOSPITAL_COMMUNITY)
Admission: EM | Disposition: A | Discharge status: Home / Self Care | Payer: Self-pay | Source: Home / Self Care | Attending: Cardiology

## 2013-06-12 DIAGNOSIS — I10 Essential (primary) hypertension: Secondary | ICD-10-CM

## 2013-06-12 DIAGNOSIS — N184 Chronic kidney disease, stage 4 (severe): Secondary | ICD-10-CM

## 2013-06-12 DIAGNOSIS — I2510 Atherosclerotic heart disease of native coronary artery without angina pectoris: Secondary | ICD-10-CM

## 2013-06-12 DIAGNOSIS — E1129 Type 2 diabetes mellitus with other diabetic kidney complication: Secondary | ICD-10-CM

## 2013-06-12 HISTORY — PX: PERCUTANEOUS CORONARY ROTOBLATOR INTERVENTION (PCI-R): SHX5484

## 2013-06-12 LAB — IRON AND TIBC
IRON: 38 ug/dL — AB (ref 42–135)
Saturation Ratios: 14 % — ABNORMAL LOW (ref 20–55)
TIBC: 273 ug/dL (ref 215–435)
UIBC: 235 ug/dL (ref 125–400)

## 2013-06-12 LAB — RENAL FUNCTION PANEL
ALBUMIN: 2.9 g/dL — AB (ref 3.5–5.2)
BUN: 43 mg/dL — AB (ref 6–23)
CHLORIDE: 110 meq/L (ref 96–112)
CO2: 18 mEq/L — ABNORMAL LOW (ref 19–32)
Calcium: 8.2 mg/dL — ABNORMAL LOW (ref 8.4–10.5)
Creatinine, Ser: 3.05 mg/dL — ABNORMAL HIGH (ref 0.50–1.35)
GFR calc Af Amer: 21 mL/min — ABNORMAL LOW (ref >90)
GFR calc non Af Amer: 18 mL/min — ABNORMAL LOW (ref >90)
GLUCOSE: 117 mg/dL — AB (ref 70–99)
Phosphorus: 4.3 mg/dL (ref 2.3–4.6)
Potassium: 4 mEq/L (ref 3.7–5.3)
Sodium: 143 mEq/L (ref 137–147)

## 2013-06-12 LAB — POCT ACTIVATED CLOTTING TIME
ACTIVATED CLOTTING TIME: 249 s
Activated Clotting Time: 249 seconds
Activated Clotting Time: 160 seconds

## 2013-06-12 LAB — CBC
HEMATOCRIT: 25.8 % — AB (ref 39.0–52.0)
HEMOGLOBIN: 8.9 g/dL — AB (ref 13.0–17.0)
MCH: 31 pg (ref 26.0–34.0)
MCHC: 34.5 g/dL (ref 30.0–36.0)
MCV: 89.9 fL (ref 78.0–100.0)
Platelets: 166 10*3/uL (ref 150–400)
RBC: 2.87 MIL/uL — ABNORMAL LOW (ref 4.22–5.81)
RDW: 14 % (ref 11.5–15.5)
WBC: 6.1 10*3/uL (ref 4.0–10.5)

## 2013-06-12 LAB — HEPARIN LEVEL (UNFRACTIONATED): Heparin Unfractionated: 0.27 IU/mL — ABNORMAL LOW (ref 0.30–0.70)

## 2013-06-12 LAB — URINE CULTURE: Colony Count: 85000

## 2013-06-12 LAB — GLUCOSE, CAPILLARY
Glucose-Capillary: 104 mg/dL — ABNORMAL HIGH (ref 70–99)
Glucose-Capillary: 99 mg/dL (ref 70–99)
Glucose-Capillary: 91 mg/dL (ref 70–99)
Glucose-Capillary: 108 mg/dL — ABNORMAL HIGH (ref 70–99)

## 2013-06-12 SURGERY — PERCUTANEOUS CORONARY ROTOBLATOR INTERVENTION (PCI-R)
Anesthesia: LOCAL

## 2013-06-12 MED ORDER — SODIUM CHLORIDE 0.9 % IJ SOLN: 3.0000 mL | INTRAMUSCULAR | Status: DC | PRN

## 2013-06-12 MED ORDER — SODIUM CHLORIDE 0.9 % IJ SOLN: 3.0000 mL | Freq: Two times a day (BID) | INTRAMUSCULAR | Status: DC

## 2013-06-12 MED ORDER — HEPARIN SODIUM (PORCINE) 1000 UNIT/ML IJ SOLN
INTRAMUSCULAR | Status: AC
  Filled 2013-06-12: qty 1

## 2013-06-12 MED ORDER — ONDANSETRON HCL 4 MG/2ML IJ SOLN: 4.0000 mg | Freq: Four times a day (QID) | INTRAMUSCULAR | Status: DC | PRN

## 2013-06-12 MED ORDER — FENTANYL CITRATE 0.05 MG/ML IJ SOLN
INTRAMUSCULAR | Status: AC
  Filled 2013-06-12: qty 2

## 2013-06-12 MED ORDER — SODIUM CHLORIDE 0.9 % IV SOLN
1.0000 mL/kg/h | INTRAVENOUS | Status: AC
  Administered 2013-06-12: 1 mL/kg/h via INTRAVENOUS

## 2013-06-12 MED ORDER — MIDAZOLAM HCL 2 MG/2ML IJ SOLN
INTRAMUSCULAR | Status: AC
  Filled 2013-06-12: qty 2

## 2013-06-12 MED ORDER — ACETAMINOPHEN 325 MG PO TABS: 650.0000 mg | ORAL_TABLET | ORAL | Status: DC | PRN

## 2013-06-12 MED ORDER — SODIUM CHLORIDE 0.9 % IJ SOLN
3.0000 mL | Freq: Two times a day (BID) | INTRAMUSCULAR | Status: DC
  Administered 2013-06-13 (×2): 3 mL via INTRAVENOUS

## 2013-06-12 MED ORDER — HEPARIN (PORCINE) IN NACL 2-0.9 UNIT/ML-% IJ SOLN
INTRAMUSCULAR | Status: AC
  Filled 2013-06-12: qty 1000

## 2013-06-12 MED ORDER — NITROGLYCERIN 0.2 MG/ML ON CALL CATH LAB
INTRAVENOUS | Status: AC
  Filled 2013-06-12: qty 1

## 2013-06-12 MED ORDER — LIDOCAINE HCL (PF) 1 % IJ SOLN
INTRAMUSCULAR | Status: AC
  Filled 2013-06-12: qty 30

## 2013-06-12 MED ORDER — SODIUM CHLORIDE 0.9 % IV SOLN
250.0000 mL | INTRAVENOUS | Status: DC | PRN
  Administered 2013-06-12: 21:00:00 via INTRAVENOUS

## 2013-06-12 MED ORDER — METOPROLOL TARTRATE 12.5 MG HALF TABLET
12.5000 mg | ORAL_TABLET | Freq: Two times a day (BID) | ORAL | Status: DC
  Administered 2013-06-12 – 2013-06-13 (×3): 12.5 mg via ORAL
  Filled 2013-06-12 (×8): qty 1

## 2013-06-12 MED ORDER — VERAPAMIL HCL 2.5 MG/ML IV SOLN
INTRAVENOUS | Status: AC
  Filled 2013-06-12: qty 2

## 2013-06-12 MED ORDER — SODIUM CHLORIDE 0.9 % IV SOLN: 1.0000 mL/kg/h | INTRAVENOUS | Status: DC

## 2013-06-12 MED ORDER — SODIUM CHLORIDE 0.9 % IV SOLN: 250.0000 mL | INTRAVENOUS | Status: DC | PRN

## 2013-06-12 NOTE — H&P (View-Only) (Signed)
Subjective: No complaints, denies any chest pain or SOB over the weekend. No lightheadedness or dizzyness.   Objective: Vital signs in last 24 hours: Temp:  [97.8 F (36.6 C)-98.3 F (36.8 C)] 97.9 F (36.6 C) (01/19 0543) Pulse Rate:  [64-77] 71 (01/19 0543) Resp:  [15-18] 15 (01/19 0543) BP: (120-133)/(45-57) 133/57 mmHg (01/19 0543) SpO2:  [97 %-98 %] 97 % (01/19 0543) Weight:  [220 lb 0.3 oz (99.8 kg)] 220 lb 0.3 oz (99.8 kg) (01/18 0819) Last BM Date: 06/11/13  Intake/Output from previous day: 01/18 0701 - 01/19 0700 In: 960 [P.O.:960] Out: 1450 [Urine:1450] Intake/Output this shift:   Telemetry: NSR, 6.36 sec pause noted on monitor at 5:37 am today.  Medications Current Facility-Administered Medications  Medication Dose Route Frequency Provider Last Rate Last Dose  . 0.9 %  sodium chloride infusion  250 mL Intravenous PRN Minus Breeding, MD      . 0.9 %  sodium chloride infusion   Intravenous Continuous Minus Breeding, MD 50 mL/hr at 06/11/13 2207    . amLODipine (NORVASC) tablet 10 mg  10 mg Oral Daily Cletus Gash, MD   10 mg at 06/11/13 9767  . aspirin chewable tablet 81 mg  81 mg Oral Daily Peter M Martinique, MD   81 mg at 06/11/13 3419  . atorvastatin (LIPITOR) tablet 20 mg  20 mg Oral q1800 Cletus Gash, MD   20 mg at 06/11/13 3790  . ezetimibe (ZETIA) tablet 10 mg  10 mg Oral q morning - 10a Cletus Gash, MD   10 mg at 06/11/13 2409  . heparin ADULT infusion 100 units/mL (25000 units/250 mL)  1,900 Units/hr Intravenous Continuous Rogue Bussing, [facility] 19 mL/hr at 06/12/13 0715 1,900 Units/hr at 06/12/13 0715  . insulin aspart (novoLOG) injection 0-15 Units  0-15 Units Subcutaneous TID WC Cletus Gash, MD   2 Units at 06/10/13 1323  . insulin glargine (LANTUS) injection 30 Units  30 Units Subcutaneous QHS Cletus Gash, MD   30 Units at 06/11/13 2148  . metoprolol tartrate (LOPRESSOR) tablet 12.5 mg  12.5 mg Oral BID Peter M Martinique, MD      .  nitroGLYCERIN 0.2 mg/mL in dextrose 5 % infusion  10-200 mcg/min Intravenous Titrated [name], MD 10.5 mL/hr at 06/12/13 0306 35 mcg/min at 06/12/13 0306  . pantoprazole (PROTONIX) EC tablet 40 mg  40 mg Oral Daily Cletus Gash, MD   40 mg at 06/11/13 7353  . sodium chloride 0.9 % injection 3 mL  3 mL Intravenous Q12H Minus Breeding, MD   3 mL at 06/11/13 2149  . sodium chloride 0.9 % injection 3 mL  3 mL Intravenous PRN Minus Breeding, MD      . tamsulosin [facility]) capsule 0.4 mg  0.4 mg Oral Daily Cletus Gash, MD   0.4 mg at 06/11/13 2992  . Ticagrelor (BRILINTA) tablet 90 mg  90 mg Oral BID Peter M Martinique, MD   90 mg at 06/11/13 2149    PE: General appearance: alert, cooperative and no distress Lungs: clear to auscultation bilaterally Heart: regular rate and rhythm, S1, S2 normal, no murmur, click, rub or gallop Extremities: 1+ lower extremity edema. Pulses: 2+ and symmetric Skin:  Warm and dry.  Right groin:  Nontender, no ecchymosis or hematoma. Neurologic: Grossly normal  Lab Results:   Recent Labs  06/10/13 0420 06/11/13 0500 06/12/13 0434  WBC 7.2 7.1 6.1  HGB 10.0* 9.7* 8.9*  HCT 30.8* 28.8* 25.8*  PLT 189  181 166   BMET  Recent Labs  06/10/13 0420 06/11/13 0542 06/12/13 0434  NA 144 144 143  K 4.2 4.1 4.0  CL 112 109 110  CO2 19 19 18*  GLUCOSE 103* 52* 117*  BUN 35* 40* 43*  CREATININE 2.68* 3.01* 3.05*  CALCIUM 8.5 8.6 8.2*   PT/INR No results found for this basename: LABPROT, INR,  in the last 72 hours    Assessment/Plan  Principal Problem:   NSTEMI (non-ST elevated myocardial infarction) Active Problems:   CARCINOMA, BLADDER   DYSLIPIDEMIA   HYPERTENSION   ATHEROSCLEROTIC CARDIOVASCULAR DISEASE   DM (diabetes mellitus), type 2 with renal complications   Abnormal nuclear stress test, 06/06/13, mod inf. lat ischemia  Plan: 80 yo WM with history of CAD s/p remote stents in 1997 present with NSTEMI. He has CKD stage 4.    SP LHC revealing 2 vessel obstructive CAD.  Culprit in the LCx.  The RCA has extensive stenting with moderate diffuse in stent disease and a focal area of 90% stenosis at the crux.   PCI for Monday of the LCx lesion with rotational atherectomy. Loaded with Brilinta.   BP stable.  Renal function stable. 6.36 sec pause noted. Will reduce metoprolol to 12.5 mg bid and monitor.  1. NSTEMI 2. CKD stage 4 3. Sinus pause 4. CAD s/p stenting of RCA 5. HTN 6. Hyperlipidemia. 7. History of bladder CA 8. DM type 2.    LOS: 4 days    Peter Martinique 06/12/2013 7:31 AM

## 2013-06-12 NOTE — Progress Notes (Signed)
Left groin is noted to be a level 1. Oozing noted from arterial site. Left femoral venous sheath removed at this time.  Pressure was held for 30 minutes. Hemostasis was achieved.  Oozing has stopped. A dry sterile dressing was applied. Peripheral pulses at baseline.  Patient tolerated the procedure well.

## 2013-06-12 NOTE — CV Procedure (Signed)
    CARDIAC CATH NOTE  Name: Craig Castillo MRN: [id number] DOB: [birth year]  Procedure: Temporary transvenous pacemaker placement under fluoroscopic guidance, rotational atherectomy, PTCA, and stenting of the left circumflex, Perclose of the right femoral artery.  Indication: Non-ST elevation infarction. This 80 year old gentleman with stage IV chronic kidney disease presented with non-ST segment elevation MI. He underwent cardiac catheterization last week demonstrating critical heavily calcified stenosis of the left circumflex and severe diffuse stenosis of the RCA. The RCA has been previously stented and there is long segment diffuse in-stent restenosis. I reviewed his films with Dr. Martinique. We agreed the circumflex is probably his culprit vessel. Considering the patient's chronic kidney disease, we have elected to treat his left circumflex with rotational atherectomy and stenting. Will otherwise treat him medically unless he has recurrent anginal symptoms. He has been pretreated with aspirin and brilinta.  Procedural Details: The right groin was prepped, draped, and anesthetized with 1% lidocaine. Using the modified Seldinger technique, a 6 Fr sheath was introduced into the right femoral vein.  Using a micropuncture needle, the right femoral artery was accessed. The micropuncture sheath was used to perform a femoral angiogram. This demonstrated appropriate access in the common femoral artery. The vessel was Perclosed, then a 7 [place] sheath was advanced over a 0.035 inch wire. Weight-based heparin was given for anticoagulation. Once a therapeutic ACT was achieved, a 7 [place] XB 3.5 cm guide catheter was inserted. A transvenous pacing wire was advanced into the RV apex and appropriate capture was achieved. The pacemaker was set with a backup rate of 50 beats per minute. A 1.5 mm rotational atherectomy bur was prepped on the table. It was then platformed at 165,000 rpm's outside the body. A rota-floppy  coronary guidewire was used to cross the lesion. Rotational atherectomy was then performed with a 1.5 mm burr on multiple passes. The wire was then changed out for a cougar wire. The lesion was predilated with a 2.5 mm balloon.  The lesion was then stented with a 3.0 x 15 mm Xience Alpine drug-eluting stent.  The stent was postdilated with a 3.25 mm noncompliant balloon.  Following PCI, there was 0% residual stenosis and TIMI-3 flow. Final angiography confirmed an excellent result. The patient tolerated the procedure well. There were no immediate procedural complications. Femoral hemostasis was achieved with a Perclose. The patient was transferred to the post catheterization recovery area for further monitoring.  Lesion Data: Vessel: LCx/Prox Percent stenosis (pre): 95 TIMI-flow (pre):  3 Stent:  3.0x15 mm Xience DES Percent stenosis (post): 0 TIMI-flow (post): 3  Conclusions: Successful PCI of the LCx with a DES using adjunctive rotational atherectomy  Recommendations: ASA, Brilinta at least 12 months, aggressive med Rx for residual CAD.  Sherren Mocha 06/12/2013, 11:26 AM

## 2013-06-12 NOTE — Progress Notes (Signed)
ANTICOAGULATION CONSULT NOTE - Follow Up Consult  Pharmacy Consult for heparin Indication: CAD  Labs:  Recent Labs  06/09/13 0720 06/09/13 1419 06/09/13 1935 06/10/13 0420  06/10/13 1740 06/11/13 0500 06/11/13 0542 06/12/13 0434  HGB 10.4*  --   --  10.0*  --   --  9.7*  --  8.9*  HCT 30.9*  --   --  30.8*  --   --  28.8*  --  25.8*  PLT 197  --   --  189  --   --  181  --  166  HEPARINUNFRC 0.10*  --   --   --   < > 0.38 0.44  --  0.27*  CREATININE 2.63*  --   --  2.68*  --   --   --  3.01* 3.05*  TROPONINI >20.00* >20.00* 14.82*  --   --   --   --   --   --   < > = values in this interval not displayed.   Assessment: 80yo male now subthepeutic on heparin after two levels at goal with current rate, scheduled for PCI today.  Goal of Therapy:  Heparin level 0.3-0.7 units/ml   Plan:  Will increase heparin gtt by 1 unit/kg/hr to 1900 units/hr until off for cath lab and f/u after.  Wynona Neat, PharmD, BCPS  06/12/2013,6:24 AM

## 2013-06-12 NOTE — Progress Notes (Signed)
  Subjective: No complaints, denies any chest pain or SOB over the weekend. No lightheadedness or dizzyness.   Objective: Vital signs in last 24 hours: Temp:  [97.8 F (36.6 C)-98.3 F (36.8 C)] 97.9 F (36.6 C) (01/19 0543) Pulse Rate:  [64-77] 71 (01/19 0543) Resp:  [15-18] 15 (01/19 0543) BP: (120-133)/(45-57) 133/57 mmHg (01/19 0543) SpO2:  [97 %-98 %] 97 % (01/19 0543) Weight:  [220 lb 0.3 oz (99.8 kg)] 220 lb 0.3 oz (99.8 kg) (01/18 0819) Last BM Date: 06/11/13  Intake/Output from previous day: 01/18 0701 - 01/19 0700 In: 960 [P.O.:960] Out: 1450 [Urine:1450] Intake/Output this shift:   Telemetry: NSR, 6.36 sec pause noted on monitor at 5:37 am today.  Medications Current Facility-Administered Medications  Medication Dose Route Frequency Provider Last Rate Last Dose  . 0.9 %  sodium chloride infusion  250 mL Intravenous PRN James Hochrein, MD      . 0.9 %  sodium chloride infusion   Intravenous Continuous James Hochrein, MD 50 mL/hr at 06/11/13 2207    . amLODipine (NORVASC) tablet 10 mg  10 mg Oral Daily Matthew Whitlock, MD   10 mg at 06/11/13 0937  . aspirin chewable tablet 81 mg  81 mg Oral Daily Angeleen Horney M Iantha Titsworth, MD   81 mg at 06/11/13 0937  . atorvastatin (LIPITOR) tablet 20 mg  20 mg Oral q1800 Matthew Whitlock, MD   20 mg at 06/11/13 0937  . ezetimibe (ZETIA) tablet 10 mg  10 mg Oral q morning - 10a Matthew Whitlock, MD   10 mg at 06/11/13 0937  . heparin ADULT infusion 100 units/mL (25000 units/250 mL)  1,900 Units/hr Intravenous Continuous Veronda Pauline Bryk, RPH 19 mL/hr at 06/12/13 0715 1,900 Units/hr at 06/12/13 0715  . insulin aspart (novoLOG) injection 0-15 Units  0-15 Units Subcutaneous TID WC Matthew Whitlock, MD   2 Units at 06/10/13 1323  . insulin glargine (LANTUS) injection 30 Units  30 Units Subcutaneous QHS Matthew Whitlock, MD   30 Units at 06/11/13 2148  . metoprolol tartrate (LOPRESSOR) tablet 12.5 mg  12.5 mg Oral BID Dishon Kehoe M Smera Guyette, MD      .  nitroGLYCERIN 0.2 mg/mL in dextrose 5 % infusion  10-200 mcg/min Intravenous Titrated Christopher J. Pollina, MD 10.5 mL/hr at 06/12/13 0306 35 mcg/min at 06/12/13 0306  . pantoprazole (PROTONIX) EC tablet 40 mg  40 mg Oral Daily Matthew Whitlock, MD   40 mg at 06/11/13 0937  . sodium chloride 0.9 % injection 3 mL  3 mL Intravenous Q12H James Hochrein, MD   3 mL at 06/11/13 2149  . sodium chloride 0.9 % injection 3 mL  3 mL Intravenous PRN James Hochrein, MD      . tamsulosin (FLOMAX) capsule 0.4 mg  0.4 mg Oral Daily Matthew Whitlock, MD   0.4 mg at 06/11/13 0937  . Ticagrelor (BRILINTA) tablet 90 mg  90 mg Oral BID Dedra Matsuo M Gustav Knueppel, MD   90 mg at 06/11/13 2149    PE: General appearance: alert, cooperative and no distress Lungs: clear to auscultation bilaterally Heart: regular rate and rhythm, S1, S2 normal, no murmur, click, rub or gallop Extremities: 1+ lower extremity edema. Pulses: 2+ and symmetric Skin:  Warm and dry.  Right groin:  Nontender, no ecchymosis or hematoma. Neurologic: Grossly normal  Lab Results:   Recent Labs  06/10/13 0420 06/11/13 0500 06/12/13 0434  WBC 7.2 7.1 6.1  HGB 10.0* 9.7* 8.9*  HCT 30.8* 28.8* 25.8*  PLT 189   181 166   BMET  Recent Labs  06/10/13 0420 06/11/13 0542 06/12/13 0434  NA 144 144 143  K 4.2 4.1 4.0  CL 112 109 110  CO2 19 19 18*  GLUCOSE 103* 52* 117*  BUN 35* 40* 43*  CREATININE 2.68* 3.01* 3.05*  CALCIUM 8.5 8.6 8.2*   PT/INR No results found for this basename: LABPROT, INR,  in the last 72 hours    Assessment/Plan  Principal Problem:   NSTEMI (non-ST elevated myocardial infarction) Active Problems:   CARCINOMA, BLADDER   DYSLIPIDEMIA   HYPERTENSION   ATHEROSCLEROTIC CARDIOVASCULAR DISEASE   DM (diabetes mellitus), type 2 with renal complications   Abnormal nuclear stress test, 06/06/13, mod inf. lat ischemia  Plan: 80 yo WM with history of CAD s/p remote stents in 1997 present with NSTEMI. He has CKD stage 4.    SP LHC revealing 2 vessel obstructive CAD.  Culprit in the LCx.  The RCA has extensive stenting with moderate diffuse in stent disease and a focal area of 90% stenosis at the crux.   PCI for Monday of the LCx lesion with rotational atherectomy. Loaded with Brilinta.   BP stable.  Renal function stable. 6.36 sec pause noted. Will reduce metoprolol to 12.5 mg bid and monitor.  1. NSTEMI 2. CKD stage 4 3. Sinus pause 4. CAD s/p stenting of RCA 5. HTN 6. Hyperlipidemia. 7. History of bladder CA 8. DM type 2.    LOS: 4 days    Decarla Siemen Martinique 06/12/2013 7:31 AM

## 2013-06-12 NOTE — Interval H&P Note (Signed)
History and Physical Interval Note:  06/12/2013 9:45 AM  Craig Castillo  has presented today for surgery, with the diagnosis of CAD  The various methods of treatment have been discussed with the patient and family. After consideration of risks, benefits and other options for treatment, the patient has consented to  Procedure(s): PERCUTANEOUS CORONARY ROTOBLATOR INTERVENTION (PCI-R) (N/A) as a surgical intervention .  The patient's history has been reviewed, patient examined, no change in status, stable for surgery.  I have reviewed the patient's chart and labs.  Questions were answered to the patient's satisfaction.    Films reviewed. Discussed case with Dr Martinique. Plan rotational atherectomy with PTCA/stenting of the left circumflex which appears to be his culprit lesion. Med Rx initially for his residual CAD involving the RCA. He is on Brilinta and ASA.  Cath Lab Visit (complete for each Cath Lab visit)  Clinical Evaluation Leading to the Procedure:   ACS: yes  Non-ACS:    Anginal Classification: CCS IV  Anti-ischemic medical therapy: Maximal Therapy (2 or more classes of medications)  Non-Invasive Test Results: No non-invasive testing performed  Prior CABG: No previous CABG       Craig Castillo

## 2013-06-12 NOTE — Progress Notes (Signed)
[facility] Progress Note   Subjective:   Feeling well this am. No complaints of chest pain or SOB. No acute overnight events.    Objective:   BP 133/57  Pulse 71  Temp(Src) 97.9 F (36.6 C) (Oral)  Resp 15  Ht 5\' 8"  (1.727 m)  Wt 220 lb 0.3 oz (99.8 kg)  BMI 33.46 kg/m2  SpO2 97%  Intake/Output Summary (Last 24 hours) at 06/12/13 0727 Last data filed at 06/12/13 0307  Gross per 24 hour  Intake    960 ml  Output   1450 ml  Net   -490 ml   Physical Exam: General: well appearing, NAD.  Cardiovascular: RRR. No murmurs, rubs, or gallops.  Decreased HS, Gr2/6 M Respiratory: CTAB. No rales, rhonchi, or wheeze.  Abdomen: soft, nontender, nondistended.  Extremities: 1+ LE edema. Groin hematoma Skin:  Warm, dry, intact.  Neuro: No focal deficits.  Imaging: Dg Chest Port 1 View  06/08/2013   CLINICAL DATA:  Chest pain. Current history of hypertension, diabetes and chronic kidney disease. Prior MI.  EXAM: PORTABLE CHEST - 1 VIEW  COMPARISON:  Portable examinations 12/12/2012. Two-view chest x-ray 09/14/2012, 03/11/2012.  FINDINGS: Cardiac silhouette mildly to moderately enlarged for the AP portable technique, increased in size since the July, 2014 examination. Moderate diffuse interstitial pulmonary edema. Asymmetric airspace pulmonary edema in the right perihilar region. No visible pleural effusions.  IMPRESSION: Mild CHF, with mild to moderate cardiomegaly and moderate diffuse interstitial pulmonary edema. Asymmetric airspace edema in the right perihilar region. Interval increase in heart size since July, 2014.  Labs: BMET  Recent Labs Lab 06/08/13 2156 06/09/13 0720 06/10/13 0420 06/11/13 0542 06/12/13 0434  NA 143 145 144 144 143  K 4.1 4.2 4.2 4.1 4.0  CL 108 112 112 109 110  CO2 20 19 19 19  18*  GLUCOSE 185* 93 103* 52* 117*  BUN 32* 31* 35* 40* 43*  CREATININE 2.74* 2.63* 2.68* 3.01* 3.05*  CALCIUM 8.9 8.6 8.5 8.6 8.2*  PHOS  --   --  3.4 4.0 4.3    CBC  Recent Labs Lab 06/08/13 2156 06/09/13 0720 06/10/13 0420 06/11/13 0500 06/12/13 0434  WBC 8.4 7.6 7.2 7.1 6.1  NEUTROABS 6.8  --   --   --   --   HGB 11.7* 10.4* 10.0* 9.7* 8.9*  HCT 33.9* 30.9* 30.8* 28.8* 25.8*  MCV 92.4 90.9 93.1 90.6 89.9  PLT 202 197 189 181 166    Medications:    . sodium chloride 50 mL/hr at 06/11/13 2207  . heparin 1,900 Units/hr (06/12/13 0715)  . nitroGLYCERIN 35 mcg/min (06/12/13 0306)    . amLODipine  10 mg Oral Daily  . aspirin  81 mg Oral Daily  . atorvastatin  20 mg Oral q1800  . ezetimibe  10 mg Oral q morning - 10a  . insulin aspart  0-15 Units Subcutaneous TID WC  . insulin glargine  30 Units Subcutaneous QHS  . metoprolol tartrate  50 mg Oral BID  . pantoprazole  40 mg Oral Daily  . sodium chloride  3 mL Intravenous Q12H  . tamsulosin  0.4 mg Oral Daily  . Ticagrelor  90 mg Oral BID   Assessment/ Plan:    Pt is a 80 y.o. yo male with a PMHX of CAD s/p PCI (remotely), HTN, HLD, Stroke, CKD stage III-IV, hx of bladder cancer was admitted to [facility] on 06/08/2013 from [facility] with chest pain and concern for NSTEMI.  Nephrology consulted due to CKD and to follow after Cardiac cath (for possible contrast induced nephropathy). Very high risk for worsening AKI and CKD (over 40%) and may need HD acutely.  Will not get back to baseline 1) NSTEMI  2) CAD s/p PCI  3) CKD stage IV  4) HTN  5) DM-2  6) Hx of Bladder Cancer  7) CHF exacerbation   Plan:  1) NSTEMI in setting of CKD IV - Cath done on 1/16 and revealed 2 vessel obstructive CAD; Culprit lesion - LCX; diffuse disease of the RCA. - Creatinine has risen over the weekend but is stable from yesterday (Cr 3.05) - Patient to go for Cath today.  Patient is at high risk of contrast-induced injury and having to be placed on HD> - Agree with fluids prior to cath. - Will following along closely  2) HTN Vol, will diuresis and meds -  3) CHF exacerbation  - Per  Cardiology  4) Remainder per primary team.  Coral Spikes, DO PGY-2, [facility] 06/12/2013, 7:27 AM I have seen and examined this patient and agree with the plan of care seen, eval, examined and counseled.  Very concerned about status. .  Timmothy Baranowski L 06/12/2013, 1:22 PM

## 2013-06-13 DIAGNOSIS — I214 Non-ST elevation (NSTEMI) myocardial infarction: Secondary | ICD-10-CM

## 2013-06-13 DIAGNOSIS — I2510 Atherosclerotic heart disease of native coronary artery without angina pectoris: Secondary | ICD-10-CM

## 2013-06-13 LAB — GLUCOSE, CAPILLARY
GLUCOSE-CAPILLARY: 99 mg/dL (ref 70–99)
Glucose-Capillary: 83 mg/dL (ref 70–99)
Glucose-Capillary: 96 mg/dL (ref 70–99)
Glucose-Capillary: 92 mg/dL (ref 70–99)

## 2013-06-13 LAB — RENAL FUNCTION PANEL
Albumin: 2.9 g/dL — ABNORMAL LOW (ref 3.5–5.2)
BUN: 37 mg/dL — ABNORMAL HIGH (ref 6–23)
CALCIUM: 8.5 mg/dL (ref 8.4–10.5)
CO2: 17 meq/L — AB (ref 19–32)
Chloride: 111 mEq/L (ref 96–112)
Creatinine, Ser: 2.72 mg/dL — ABNORMAL HIGH (ref 0.50–1.35)
GFR calc Af Amer: 24 mL/min — ABNORMAL LOW (ref >90)
GFR, EST NON AFRICAN AMERICAN: 21 mL/min — AB (ref >90)
Glucose, Bld: 98 mg/dL (ref 70–99)
POTASSIUM: 4 meq/L (ref 3.7–5.3)
Phosphorus: 4.1 mg/dL (ref 2.3–4.6)
SODIUM: 144 meq/L (ref 137–147)

## 2013-06-13 LAB — CBC
HCT: 27 % — ABNORMAL LOW (ref 39.0–52.0)
Hemoglobin: 9.1 g/dL — ABNORMAL LOW (ref 13.0–17.0)
MCH: 30.5 pg (ref 26.0–34.0)
MCHC: 33.7 g/dL (ref 30.0–36.0)
MCV: 90.6 fL (ref 78.0–100.0)
Platelets: 192 10*3/uL (ref 150–400)
RBC: 2.98 MIL/uL — ABNORMAL LOW (ref 4.22–5.81)
RDW: 14.1 % (ref 11.5–15.5)
WBC: 6 10*3/uL (ref 4.0–10.5)

## 2013-06-13 LAB — PARATHYROID HORMONE, INTACT (NO CA): PTH: 132.7 pg/mL — AB (ref 14.0–72.0)

## 2013-06-13 MED ORDER — FUROSEMIDE 80 MG PO TABS
160.0000 mg | ORAL_TABLET | Freq: Three times a day (TID) | ORAL | Status: DC
  Administered 2013-06-13 (×2): 160 mg via ORAL
  Filled 2013-06-13 (×5): qty 2

## 2013-06-13 MED ORDER — TICAGRELOR 90 MG PO TABS: 90.0000 mg | ORAL_TABLET | Freq: Two times a day (BID) | ORAL | Status: DC

## 2013-06-13 MED ORDER — SODIUM BICARBONATE 650 MG PO TABS
1300.0000 mg | ORAL_TABLET | Freq: Three times a day (TID) | ORAL | Status: DC
  Administered 2013-06-13 (×3): 1300 mg via ORAL
  Filled 2013-06-13 (×6): qty 2

## 2013-06-13 MED ORDER — METOPROLOL TARTRATE 25 MG PO TABS: 12.5000 mg | ORAL_TABLET | Freq: Two times a day (BID) | ORAL | Status: DC

## 2013-06-13 NOTE — Progress Notes (Signed)
[facility] Progress Note   Subjective:   Feeling well this am. No reports of SOB or chest pain.  States that he is ready to go home.  Objective:   BP 178/59  Pulse 85  Temp(Src) 98.3 F (36.8 C) (Oral)  Resp 18  Ht 5\' 8"  (1.727 m)  Wt 220 lb 10.9 oz (100.1 kg)  BMI 33.56 kg/m2  SpO2 99%  Intake/Output Summary (Last 24 hours) at 06/13/13 0739 Last data filed at 06/13/13 0739  Gross per 24 hour  Intake 1156.1 ml  Output   1200 ml  Net  -43.9 ml   Physical Exam: General: well appearing, NAD.  Cardiovascular: RRR. 2/6 systolic murmur.  Respiratory: Diffuse wheezing noted on exam. Abdomen: soft, nontender, nondistended.  Extremities: 2+ LE edema.  Neuro: No focal deficits.  Imaging: Dg Chest Port 1 View  06/08/2013   CLINICAL DATA:  Chest pain. Current history of hypertension, diabetes and chronic kidney disease. Prior MI.  EXAM: PORTABLE CHEST - 1 VIEW  COMPARISON:  Portable examinations 12/12/2012. Two-view chest x-ray 09/14/2012, 03/11/2012.  FINDINGS: Cardiac silhouette mildly to moderately enlarged for the AP portable technique, increased in size since the July, 2014 examination. Moderate diffuse interstitial pulmonary edema. Asymmetric airspace pulmonary edema in the right perihilar region. No visible pleural effusions.  IMPRESSION: Mild CHF, with mild to moderate cardiomegaly and moderate diffuse interstitial pulmonary edema. Asymmetric airspace edema in the right perihilar region. Interval increase in heart size since July, 2014.  Labs: BMET  Recent Labs Lab 06/08/13 2156 06/09/13 0720 06/10/13 0420 06/11/13 0542 06/12/13 0434 06/13/13 0440  NA 143 145 144 144 143 144  K 4.1 4.2 4.2 4.1 4.0 4.0  CL 108 112 112 109 110 111  CO2 20 19 19 19  18* 17*  GLUCOSE 185* 93 103* 52* 117* 98  BUN 32* 31* 35* 40* 43* 37*  CREATININE 2.74* 2.63* 2.68* 3.01* 3.05* 2.72*  CALCIUM 8.9 8.6 8.5 8.6 8.2* 8.5  PHOS  --   --  3.4 4.0 4.3 4.1   CBC  Recent  Labs Lab 06/08/13 2156  06/10/13 0420 06/11/13 0500 06/12/13 0434 06/13/13 0440  WBC 8.4  < > 7.2 7.1 6.1 6.0  NEUTROABS 6.8  --   --   --   --   --   HGB 11.7*  < > 10.0* 9.7* 8.9* 9.1*  HCT 33.9*  < > 30.8* 28.8* 25.8* 27.0*  MCV 92.4  < > 93.1 90.6 89.9 90.6  PLT 202  < > 189 181 166 192  < > = values in this interval not displayed.  Iron/TIBC/Ferritin    Component Value Date/Time   IRON 38* 06/12/2013 1350   TIBC 273 06/12/2013 1350   FERRITIN 758* 08/29/2009 0515    Medications:    . nitroGLYCERIN Stopped (06/13/13 0530)    . amLODipine  10 mg Oral Daily  . aspirin  81 mg Oral Daily  . atorvastatin  20 mg Oral q1800  . ezetimibe  10 mg Oral q morning - 10a  . insulin aspart  0-15 Units Subcutaneous TID WC  . insulin glargine  30 Units Subcutaneous QHS  . metoprolol tartrate  12.5 mg Oral BID  . pantoprazole  40 mg Oral Daily  . sodium chloride  3 mL Intravenous Q12H  . tamsulosin  0.4 mg Oral Daily  . Ticagrelor  90 mg Oral BID   Assessment/ Plan:    Pt is a 80 y.o. yo male with a PMHX  of CAD s/p PCI (remotely), HTN, HLD, Stroke, CKD stage III-IV, hx of bladder cancer was admitted to [facility] on 06/08/2013 from [facility] with chest pain and concern for NSTEMI.  Nephrology consulted due to CKD and to follow after Cardiac cath (for possible contrast induced nephropathy). Patient is a very high risk for worsening AKI and CKD (over 40%) and may need HD acutely.  1) NSTEMI  2) CAD s/p PCI  3) CKD stage IV Vol xs, needs diuresis acutely by exam.  High risk for D/C to home. And not sure what Cr will do yet. 4) HTN  5) DM-2  6) Hx of Bladder Cancer  7) CHF exacerbation  8) Anemia  Plan:  1) NSTEMI in setting of CKD IV -  Cath done on 1/16 and revealed 2 vessel obstructive CAD; Culprit lesion - LCX; diffuse disease of the RCA. - Patient underwent successful PCI (DES) of LCx yesterday (1/19); No mention of amount of contrast given. - Creatinine improved this am -  2.72.  - Will continue to follow closely.  Diuresis as below.  Patient not amendable for discharge today.  2) Anemia - Iron sat 14%, Iron 38 - Will order Feraheme and Aranesp today  3) HTN/Volume status - Patient volume overloaded on exam. - PO Lasix 160 TID ordered. - Strict I/O's  4) HPTH - Awaiting PTH  Coral Spikes, DO PGY-2, [facility] 06/13/2013, 7:39 AM I have seen and examined this patient and agree with the plan of care seen, examined, eval, discussed with residents and primary service. .  Jeree Delcid L 06/13/2013, 12:13 PM

## 2013-06-13 NOTE — Discharge Instructions (Signed)
**  PLEASE REMEMBER TO BRING ALL OF YOUR MEDICATIONS TO EACH OF YOUR FOLLOW-UP OFFICE VISITS. ° °NO HEAVY LIFTING OR SEXUAL ACTIVITY X 7 DAYS. °NO DRIVING X 3-5 DAYS. °NO SOAKING BATHS, HOT TUBS, POOLS, ETC., X 7 DAYS. ° °Groin Site Care °Refer to this sheet in the next few weeks. These instructions provide you with information on caring for yourself after your procedure. Your caregiver may also give you more specific instructions. Your treatment has been planned according to current medical practices, but problems sometimes occur. Call your caregiver if you have any problems or questions after your procedure. °HOME CARE INSTRUCTIONS °· You may shower 24 hours after the procedure. Remove the bandage (dressing) and gently wash the site with plain soap and water. Gently pat the site dry.  °· Do not apply powder or lotion to the site.  °· Do not sit in a bathtub, swimming pool, or whirlpool for 5 to 7 days.  °· No bending, squatting, or lifting anything over 10 pounds (4.5 kg) as directed by your caregiver.  °· Inspect the site at least twice daily.  ° °What to expect: °· Any bruising will usually fade within 1 to 2 weeks.  °· Blood that collects in the tissue (hematoma) may be painful to the touch. It should usually decrease in size and tenderness within 1 to 2 weeks.  °SEEK IMMEDIATE MEDICAL CARE IF: °· You have unusual pain at the groin site or down the affected leg.  °· You have redness, warmth, swelling, or pain at the groin site.  °· You have drainage (other than a small amount of blood on the dressing).  °· You have chills.  °· You have a fever or persistent symptoms for more than 72 hours.  °· You have a fever and your symptoms suddenly get worse.  °· Your leg becomes pale, cool, tingly, or numb.  °You have heavy bleeding from the site. Hold pressure on the site. . ° °

## 2013-06-13 NOTE — Progress Notes (Signed)
CARDIAC REHAB PHASE I   PRE:  Rate/Rhythm: 77SR  BP:  Supine:   Sitting: 178/59  Standing:    SaO2:   MODE:  Ambulation: 450 ft   POST:  Rate/Rhythm: 107 ST  BP:  Supine:   Sitting: 195/90, 188/71  Standing:    SaO2:  0810-0908 Pt walked 450 ft with slow, steady gait holding to side rail in hall. I managed the IV pole. Denied CP. BP elevated prior to and after walk. Notified cardliology of BP. Education completed with pt. He voiced understanding. Left diet sheets for wife to review as pt stated she watches his meals. Brief ed on diet with him as he seems to rely on her for direction. Gave brilinta book and reviewed importance with stent. Discussed CRP 2 and pt gave permission for referral to [place]. To recliner after walk.   Graylon Good, RN BSN  06/13/2013 9:03 AM

## 2013-06-13 NOTE — Progress Notes (Addendum)
    Subjective:  Feels good. No chest pain or dyspnea. No complaints this am. Groin site had some oozing yesterday afternoon, but none since that time.  Objective:  Vital Signs in the last 24 hours: Temp:  [98.1 F (36.7 C)-98.6 F (37 C)] 98.6 F (37 C) (01/20 0530) Pulse Rate:  [69-91] 69 (01/20 0000) Resp:  [16-18] 18 (01/19 2331) BP: (117-172)/(46-79) 165/72 mmHg (01/20 0530) SpO2:  [96 %-100 %] 100 % (01/20 0000) Weight:  [220 lb 0.3 oz (99.8 kg)-220 lb 10.9 oz (100.1 kg)] 220 lb 10.9 oz (100.1 kg) (01/20 0530)  Intake/Output from previous day: 01/19 0701 - 01/20 0700 In: 1156.1 [P.O.:200; I.V.:956.1] Out: 1000 [Urine:1000]  Physical Exam: Pt is alert and oriented, NAD HEENT: normal Neck: JVP - normal Lungs: CTA bilaterally CV: RRR without murmur or gallop Abd: soft, NT, Positive BS, no hepatomegaly Ext: trace edema bilaterally, distal pulses intact and equal, right groin site clear at arterial and venous puncture sites Skin: warm/dry no rash   Lab Results:  Recent Labs  06/12/13 0434 06/13/13 0440  WBC 6.1 6.0  HGB 8.9* 9.1*  PLT 166 192    Recent Labs  06/12/13 0434 06/13/13 0440  NA 143 144  K 4.0 4.0  CL 110 111  CO2 18* 17*  GLUCOSE 117* 98  BUN 43* 37*  CREATININE 3.05* 2.72*   No results found for this basename: TROPONINI, CK, MB,  in the last 72 hours  Cardiac Studies: 2D Echo: Study Conclusions  - Left ventricle: The cavity size was mildly dilated. Wall thickness was normal. Systolic function was normal. The estimated ejection fraction was in the range of 60% to 65%. - Left atrium: The atrium was mildly dilated.  Assessment/Plan:  1. NSTEMI  2. CKD stage 4 - stable 3. Sinus pause - no further on tele review 4. CAD s/p remote stenting of RCA and rotational atherectomy/PCI of the LCx yesterday 5. HTN  6. Hyperlipidemia.  7. History of bladder CA  8. DM type 2.   Tele, labs, meds reviewed. No further pauses. Groin site stable  after PCI of the LCx yesterday. Treated with drug-eluting stent so will need ASA/brilinta at least 12 months without interruption. No ACE-I secondary to advanced CKD. Continue low dose beta blocker, statin, amlodipine. Follow-up with Dr Harl Bowie within 2 weeks.  Sherren Mocha, M.D. 06/13/2013, 7:38 AM

## 2013-06-13 NOTE — Discharge Summary (Signed)
Discharge Summary   Patient ID: Craig Castillo,  MRN: [id number], DOB/AGE: 01/18/1933 80 y.o.  Admit date: 06/08/2013 Discharge date: 06/14/2013  Primary Care Provider: Glo Herring Primary Cardiologist: Zandra Abts, MD [facility])  Discharge Diagnoses Principal Problem:   NSTEMI (non-ST elevated myocardial infarction)  **S/p PCI and DES to the left circumflex this admission.  Active Problems:   CAD (coronary artery disease)   DYSLIPIDEMIA   HYPERTENSION   DM (diabetes mellitus), type 2 with renal complications   CARCINOMA, BLADDER   CKD (chronic kidney disease), stage IV  Allergies Allergies  Allergen Reactions  . Aspirin     Due to swelling with celecoxib  . Celecoxib Swelling  . Hydrocodone-Acetaminophen Itching  . Morphine Itching  . Niacin Itching  . Piroxicam Itching   Procedures  Cardiac Catheterization 1.16.2015  Procedural Findings: Hemodynamics:  AO 143/74 mean 106 mm Hg LV 144/24 mm Hg              Coronary angiography: Coronary dominance: right  Left mainstem: Mild tapering distal to 10-20%. Left anterior descending (LAD): The proximal LAD is heavily calcified. There is diffuse 30-40% disease in the proximal vessel. There are multiple small diagonals without significant disease. Left circumflex (LCx): The LCx gives off 2 OM branches that are moderate in size. The proximal LCx is heavily calcified with segmental 95% stenosis. The mid LCx and both OMs have mild disease diffusely to 30%.   Right coronary artery (RCA): The RCA appears to have stents from the proximal vessel to past the crux. There is diffuse calcification. There is diffuse 50% disease throughout the stented segment. There is a focal 90% stenosis at the crux that appears to be within the stent.   Left ventriculography: Not done.  Final Conclusions:   1. 2 vessel obstructive CAD. The culprit lesion appears to be the proximal LCx. The RCA has extensive stenting with moderate diffuse in  stent disease and a focal area of severe stenosis at the crux.  Recommendations: Recommend hydration over the weekend with close monitoring of renal function. Given complexity of disease I would recommend staging PCI on Monday. Will load with Brilinta. I anticipate treating the LCx will require rotational atherectomy given the heavy calcification and I would recommend DES since he has complex lesion and CKD given high risk of restenosis. It may be best to treat the culprit vessel only then follow on medical therapy for the RCA disease. _____________   Percutaneous Coronary Intervention 1.19.2015  Conclusions: Successful PCI of the LCx with a 3.0 x 15 mm Xience DES using adjunctive rotational atherectomy  Recommendations: ASA, Brilinta at least 12 months, aggressive med Rx for residual CAD. _____________   History of Present Illness  80 year old male with prior history of coronary artery disease and remote myocardial infarction in 1997. He was recently seen in the emergency department at [facility] secondary to chest pain and after ruling out was sent home and an outpatient stress test was scheduled. He underwent stress testing in late December and this was abnormal revealing a moderate sized area of inferolateral ischemia with an EF of 54%. Due to history of chronic stage III kidney disease as well as recurrent bladder tumor and hematuria issues, a trial of aggressive medical therapy was instituted and long-acting nitrate therapy was added. Unfortunately, on the evening of January 15, patient developed recurrent chest pain and presented to [facility] on January 15, where initial troponin was normal. Patient continued to have chest pain despite  initiation of IV nitroglycerin and heparin and was transferred to [facility] cone for further evaluation.  Hospital Course  Upon arrival to [facility], patient was pain-free. It was felt that he would require diagnostic cardiac catheterization  and as such, intravenous hydration was started in the setting of chronic kidney disease. Creatinine was stable at 2.63 on January 16, and he underwent diagnostic cardiac catheterization revealing severe proximal left circumflex stenosis and also diffuse right coronary artery in-stent restenosis with a 90% distal right coronary artery stenosis as well. Intervention was not immediately performed in an effort to allow his renal function to recover from contrast load. He was maintained on heparin over the weekend and had no recurrent chest pain. Because of his chronic kidney disease and mild rise of creatinine to 3.01 on January 18, nephrology was consulted and recommended ongoing IV hydration in an effort to prevent contrast-induced nephropathy.  Patient was taken back to the cardiac catheterization laboratory on January 19 and underwent successful PCI, rotational atherectomy, and drug-eluting stent placement to the left circumflex. Films were reviewed it was felt that the right coronary artery would best be managed medically. Post catheterization, patient had no further chest discomfort but did complain of mild dyspnea and was noted to have volume excess on exam. Nephrology recommended oral Lasix therapy and volume status did improve. They have recommended ongoing oral lasix at 160mg  bid.  We will arrange for f/u bmet in 1 week and nephrology has arranged for follow-up as well.  He will be discharged home today in good condition.  Discharge Vitals Blood pressure 133/55, pulse 63, temperature 97.9 F (36.6 C), temperature source Oral, resp. rate 20, height 5\' 8"  (1.727 m), weight 220 lb 10.9 oz (100.1 kg), SpO2 100.00%.  Filed Weights   06/11/13 0819 06/12/13 1200 06/13/13 0530  Weight: 220 lb 0.3 oz (99.8 kg) 220 lb 0.3 oz (99.8 kg) 220 lb 10.9 oz (100.1 kg)    Labs  CBC  Recent Labs  06/12/13 0434 06/13/13 0440  WBC 6.1 6.0  HGB 8.9* 9.1*  HCT 25.8* 27.0*  MCV 89.9 90.6  PLT 166 299   Basic  Metabolic Panel  Recent Labs  06/13/13 0440 06/14/13 0428  NA 144 146  K 4.0 3.7  CL 111 108  CO2 17* 21  GLUCOSE 98 99  BUN 37* 41*  CREATININE 2.72* 2.97*  CALCIUM 8.5 9.2  PHOS 4.1 4.3   Liver Function Tests  Recent Labs  06/13/13 0440 06/14/13 0428  ALBUMIN 2.9* 3.2*   Cardiac Enzymes Lab Results  Component Value Date   TROPONINI 14.82* 06/09/2013   Disposition  Pt is being discharged home today in good condition.  Follow-up Plans & Appointments      Follow-up Information   Follow up with Arnoldo Lenis, MD On 06/29/2013. (10:20 AM)    Specialty:  Cardiology   Contact information:   [address] [place] [address] [phone]       Follow up with Glo Herring., MD. (as scheduled.)    Specialty:  Internal Medicine   Contact information:   [address] [place] [address] [phone]       Follow up with Sherril Croon, MD. (2:30 PM)    Specialty:  Nephrology   Contact information:   [place] [address] [phone]      Discharge Medications    Medication List         amLODipine 10 MG  tablet  Commonly known as:  NORVASC  Take 10 mg by mouth daily.     aspirin EC 81 MG tablet  Take 81 mg by mouth daily.     calcitRIOL 0.25 MCG capsule  Commonly known as:  ROCALTROL  Take 1 capsule (0.25 mcg total) by mouth daily.     ezetimibe 10 MG tablet  Commonly known as:  ZETIA  Take 10 mg by mouth every morning.     furosemide 80 MG tablet  Commonly known as:  LASIX  Take 2 tablets (160 mg total) by mouth 2 (two) times daily.     gemfibrozil 600 MG tablet  Commonly known as:  LOPID  Take 600 mg by mouth 2 (two) times daily.     insulin NPH Human 100 UNIT/ML injection  Commonly known as:  HUMULIN N,NOVOLIN N  Inject 15-20 Units into the skin 2 (two) times daily. Take 20 units SQ QAM and 15 units SQ QPM     isosorbide mononitrate 30 MG 24 hr tablet  Commonly known as:  IMDUR    Take 1 tablet (30 mg total) by mouth daily.     metoprolol tartrate 25 MG tablet  Commonly known as:  LOPRESSOR  Take 0.5 tablets (12.5 mg total) by mouth 2 (two) times daily.     nitroGLYCERIN 0.4 MG SL tablet  Commonly known as:  NITROSTAT  Place 1 tablet (0.4 mg total) under the tongue as directed. Chest Pain     omeprazole 20 MG capsule  Commonly known as:  PRILOSEC  Take 20 mg by mouth every morning.     tamsulosin 0.4 MG Caps capsule  Commonly known as:  FLOMAX  Take 0.4 mg by mouth daily.     Ticagrelor 90 MG Tabs tablet  Commonly known as:  BRILINTA  Take 1 tablet (90 mg total) by mouth 2 (two) times daily.       Outstanding Labs/Studies  BMET in 1 wk.  Duration of Discharge Encounter   Greater than 30 minutes including physician time.  Signed, Murray Hodgkins NP 06/14/2013, 11:07 AM

## 2013-06-14 ENCOUNTER — Encounter (HOSPITAL_COMMUNITY): Payer: Self-pay | Admitting: Nurse Practitioner

## 2013-06-14 DIAGNOSIS — I2510 Atherosclerotic heart disease of native coronary artery without angina pectoris: Secondary | ICD-10-CM

## 2013-06-14 LAB — RENAL FUNCTION PANEL
Albumin: 3.2 g/dL — ABNORMAL LOW (ref 3.5–5.2)
BUN: 41 mg/dL — AB (ref 6–23)
CHLORIDE: 108 meq/L (ref 96–112)
CO2: 21 meq/L (ref 19–32)
Calcium: 9.2 mg/dL (ref 8.4–10.5)
Creatinine, Ser: 2.97 mg/dL — ABNORMAL HIGH (ref 0.50–1.35)
GFR calc Af Amer: 22 mL/min — ABNORMAL LOW (ref >90)
GFR calc non Af Amer: 19 mL/min — ABNORMAL LOW (ref >90)
GLUCOSE: 99 mg/dL (ref 70–99)
POTASSIUM: 3.7 meq/L (ref 3.7–5.3)
Phosphorus: 4.3 mg/dL (ref 2.3–4.6)
Sodium: 146 mEq/L (ref 137–147)

## 2013-06-14 LAB — GLUCOSE, CAPILLARY: Glucose-Capillary: 93 mg/dL (ref 70–99)

## 2013-06-14 MED ORDER — FUROSEMIDE 80 MG PO TABS: 160.0000 mg | ORAL_TABLET | Freq: Two times a day (BID) | ORAL | Status: DC

## 2013-06-14 MED ORDER — CALCITRIOL 0.25 MCG PO CAPS: 0.2500 ug | ORAL_CAPSULE | Freq: Every day | ORAL | Status: DC

## 2013-06-14 MED ORDER — CALCITRIOL 0.25 MCG PO CAPS
0.2500 ug | ORAL_CAPSULE | Freq: Every day | ORAL | Status: DC
  Administered 2013-06-14: 11:00:00 0.25 ug via ORAL
  Filled 2013-06-14: qty 1

## 2013-06-14 MED ORDER — HEART ATTACK BOUNCING BOOK
Freq: Once | Status: DC
  Filled 2013-06-14: qty 1

## 2013-06-14 NOTE — Discharge Summary (Signed)
See note this am. cdm

## 2013-06-14 NOTE — Progress Notes (Signed)
Craig Castillo Progress Note   Subjective:   Feeling well this am. No reports of SOB or chest pain.  Objective:   BP 133/55  Pulse 63  Temp(Src) 97.9 F (36.6 C) (Oral)  Resp 20  Ht 5\' 8"  (1.727 m)  Wt 220 lb 10.9 oz (100.1 kg)  BMI 33.56 kg/m2  SpO2 100%  Intake/Output Summary (Last 24 hours) at 06/14/13 0754 Last data filed at 06/14/13 0600  Gross per 24 hour  Intake   1400 ml  Output   4550 ml  Net  -3150 ml   Physical Exam: General: well appearing, NAD.  Cardiovascular: RRR. 2/6 systolic murmur.  Respiratory: Bibasilar rales noted.  Abdomen: soft, nontender, nondistended.  Extremities: 2+ LE edema.  Sacral edema also present. Neuro: No focal deficits.  Imaging: Dg Chest Port 1 View  06/08/2013   CLINICAL DATA:  Chest pain. Current history of hypertension, diabetes and chronic kidney disease. Prior MI.  EXAM: PORTABLE CHEST - 1 VIEW  COMPARISON:  Portable examinations 12/12/2012. Two-view chest x-ray 09/14/2012, 03/11/2012.  FINDINGS: Cardiac silhouette mildly to moderately enlarged for the AP portable technique, increased in size since the July, 2014 examination. Moderate diffuse interstitial pulmonary edema. Asymmetric airspace pulmonary edema in the right perihilar region. No visible pleural effusions.  IMPRESSION: Mild CHF, with mild to moderate cardiomegaly and moderate diffuse interstitial pulmonary edema. Asymmetric airspace edema in the right perihilar region. Interval increase in heart size since July, 2014.  Labs: BMET  Recent Labs Lab 06/08/13 2156 06/09/13 0720 06/10/13 0420 06/11/13 0542 06/12/13 0434 06/13/13 0440 06/14/13 0428  NA 143 145 144 144 143 144 146  K 4.1 4.2 4.2 4.1 4.0 4.0 3.7  CL 108 112 112 109 110 111 108  CO2 20 19 19 19  18* 17* 21  GLUCOSE 185* 93 103* 52* 117* 98 99  BUN 32* 31* 35* 40* 43* 37* 41*  CREATININE 2.74* 2.63* 2.68* 3.01* 3.05* 2.72* 2.97*  CALCIUM 8.9 8.6 8.5 8.6 8.2* 8.5 9.2  PHOS  --   --  3.4  4.0 4.3 4.1 4.3   CBC  Recent Labs Lab 06/08/13 2156  06/10/13 0420 06/11/13 0500 06/12/13 0434 06/13/13 0440  WBC 8.4  < > 7.2 7.1 6.1 6.0  NEUTROABS 6.8  --   --   --   --   --   HGB 11.7*  < > 10.0* 9.7* 8.9* 9.1*  HCT 33.9*  < > 30.8* 28.8* 25.8* 27.0*  MCV 92.4  < > 93.1 90.6 89.9 90.6  PLT 202  < > 189 181 166 192  < > = values in this interval not displayed.  Iron/TIBC/Ferritin    Component Value Date/Time   IRON 38* 06/12/2013 1350   TIBC 273 06/12/2013 1350   FERRITIN 758* 08/29/2009 0515    Medications:    . nitroGLYCERIN Stopped (06/13/13 0530)    . amLODipine  10 mg Oral Daily  . aspirin  81 mg Oral Daily  . atorvastatin  20 mg Oral q1800  . ezetimibe  10 mg Oral q morning - 10a  . furosemide  160 mg Oral TID  . heart attack bouncing book   Does not apply Once  . insulin aspart  0-15 Units Subcutaneous TID WC  . insulin glargine  30 Units Subcutaneous QHS  . metoprolol tartrate  12.5 mg Oral BID  . pantoprazole  40 mg Oral Daily  . sodium bicarbonate  1,300 mg Oral TID  . sodium chloride  3  mL Intravenous Q12H  . tamsulosin  0.4 mg Oral Daily  . Ticagrelor  90 mg Oral BID   Assessment/ Plan:    Pt is a 80 y.o. yo male with a PMHX of CAD s/p PCI (remotely), HTN, HLD, Stroke, CKD stage III-IV, hx of bladder cancer was admitted to [facility] on 06/08/2013 from [facility] with chest pain and concern for NSTEMI.  Nephrology consulted due to CKD and to follow after Cardiac cath (for possible contrast induced nephropathy).   1) NSTEMI  2) CAD s/p PCI  3) CKD stage IV Very concerned with xs vol and risk at home .will f/u promptly. 4) HTN  5) DM-2  6) Hx of Bladder Cancer  7) CHF exacerbation  8) Anemia  Plan:  1) NSTEMI in setting of CKD IV -  Cath done on 1/16 and revealed 2 vessel obstructive CAD; Culprit lesion - LCX; diffuse disease of the RCA.  Patient underwent successful PCI (DES) of LCx (1/19) - Patient volume overloaded yesterday on exam  prompting aggressive diuresis and cancel of planned D/C.  - Creatinine increased today in setting of diuresis; Creatinine 2.97 today.  - Patient still clinically overloaded. - Will plan to D/C home with aggressive diuretic therapy (Lasix 160 mg BID).    2) Anemia- Iron sat 14%, Iron 38 - Feraheme and Aranesp given 1/20 - Hb stable during admission.  3) HTN/Volume status - Patient volume overloaded on exam. - PO Lasix 160 TID ordered. - Strict I/O's  4) HPTH - PTH 132.7 - Starting calcitriol today.   Patient okay for discharge home with close follow up.  Follow up Renal panel in 1 week.  Close outpatient follow up.  Coral Spikes, DO PGY-2, [facility] 06/14/2013, 7:54 AM I have seen and examined this patient and agree with the plan of care seen, eval, examined, and counseled, set up for outptnt f/u. Marland Kitchen  Monserath Neff L 06/14/2013, 5:12 PM

## 2013-06-14 NOTE — Progress Notes (Signed)
CARDIAC REHAB PHASE I   PRE:  Rate/Rhythm: 74SR  BP:  Supine:   Sitting: 133/55  Standing:    SaO2:   MODE:  Ambulation: 500 ft   POST:  Rate/Rhythm: 104 ST  BP:  Supine:   Sitting: 179/58  Standing:    SaO2:  1191-4782 Pt walked 500 ft on RA with minimal asst and holding to siderail in hall. Put glove on pt's hand during walk as he is on contact isolation. Pt seemed steadier and stronger this a.m.  To recliner after walk. Denied CP. BP still elevated after walk.   Graylon Good, RN BSN  06/14/2013 8:43 AM

## 2013-06-14 NOTE — Progress Notes (Signed)
     SUBJECTIVE: No chest pain or SOB.   BP 166/73  Pulse 74  Temp(Src) 97.7 F (36.5 C) (Oral)  Resp 20  Ht 5\' 8"  (1.727 m)  Wt 220 lb 10.9 oz (100.1 kg)  BMI 33.56 kg/m2  SpO2 96%  Intake/Output Summary (Last 24 hours) at 06/14/13 0704 Last data filed at 06/14/13 0600  Gross per 24 hour  Intake   1400 ml  Output   4750 ml  Net  -3350 ml    PHYSICAL EXAM General: Well developed, well nourished, in no acute distress. Alert and oriented x 3.  Psych:  Good affect, responds appropriately Neck: + JVD. No masses noted.  Lungs: Clear bilaterally with no wheezes or rhonci noted.  Heart: RRR with no murmurs noted. Abdomen: Bowel sounds are present. Soft, non-tender.  Extremities: Trace to 1+ bilateral lower extremity edema.   LABS: Basic Metabolic Panel:  Recent Labs  06/13/13 0440 06/14/13 0428  NA 144 146  K 4.0 3.7  CL 111 108  CO2 17* 21  GLUCOSE 98 99  BUN 37* 41*  CREATININE 2.72* 2.97*  CALCIUM 8.5 9.2  PHOS 4.1 4.3   CBC:  Recent Labs  06/12/13 0434 06/13/13 0440  WBC 6.1 6.0  HGB 8.9* 9.1*  HCT 25.8* 27.0*  MCV 89.9 90.6  PLT 166 192   Current Meds: . amLODipine  10 mg Oral Daily  . aspirin  81 mg Oral Daily  . atorvastatin  20 mg Oral q1800  . ezetimibe  10 mg Oral q morning - 10a  . furosemide  160 mg Oral TID  . heart attack bouncing book   Does not apply Once  . insulin aspart  0-15 Units Subcutaneous TID WC  . insulin glargine  30 Units Subcutaneous QHS  . metoprolol tartrate  12.5 mg Oral BID  . pantoprazole  40 mg Oral Daily  . sodium bicarbonate  1,300 mg Oral TID  . sodium chloride  3 mL Intravenous Q12H  . tamsulosin  0.4 mg Oral Daily  . Ticagrelor  90 mg Oral BID   ASSESSMENT AND PLAN:  1. NSTEMI: Pt admitted with NSTEMI. Severe Circumflex stenosis treated with rotational atherectomy and DES on 06/12/13. He will need ASA/Brilinta for 12 months, longer if he tolerates. Continue low dose beta blocker, statin, amlodipine. No  Ace-inh secondary to advanced chronic kidney disease.   2. CKD, stage 4: stable. If ok with Nephrology, will d/c home today. Pt diuresed 3.5 liters yesterday with addition of Lasix. He still has mild volume overload and will likely need Lasix at discharge with close follow up. ? How much Lasix to be discharged home on per Nephrology.     3. CAD: stable s/p remote stenting of RCA and rotational atherectomy/PCI of the LCx 06/12/13  4. HTN: Continue current meds.   5. Hyperlipidemia: continue statin   6. History of bladder CA   7. DM type 2   8. Dispo: D/C home today if ok with Nephrology and f/u in 2 weeks with Dr. Harl Bowie.  Tele, labs, meds reviewed. No further pauses. Groin site stable after PCI of the LCx yesterday. Treated with drug-eluting stent so will need ASA/brilinta at least 12 months without interruption. No ACE-I secondary to advanced CKD.  Follow-up with Dr Harl Bowie within 2 weeks.    MCALHANY,CHRISTOPHER  1/21/20157:04 AM

## 2013-06-14 NOTE — Progress Notes (Signed)
Pt looks and feels much better tonight than last night.  Pt has incontinence which is worse when sleeping.  Condom cath has stayed on 4 hours before coming loose and was replaced.  Much I&O count lost due to urine drainage bag had a hole and was leaking onto floor. Skin remains dry.  Pt pleasant and pain free.

## 2013-06-29 ENCOUNTER — Ambulatory Visit (INDEPENDENT_AMBULATORY_CARE_PROVIDER_SITE_OTHER): Payer: PRIVATE HEALTH INSURANCE | Admitting: Cardiology

## 2013-06-29 ENCOUNTER — Encounter: Payer: Self-pay | Admitting: Cardiology

## 2013-06-29 VITALS — BP 144/78 | HR 76 | Ht 68.0 in | Wt 208.0 lb

## 2013-06-29 DIAGNOSIS — E785 Hyperlipidemia, unspecified: Secondary | ICD-10-CM

## 2013-06-29 DIAGNOSIS — I2510 Atherosclerotic heart disease of native coronary artery without angina pectoris: Secondary | ICD-10-CM

## 2013-06-29 DIAGNOSIS — I10 Essential (primary) hypertension: Secondary | ICD-10-CM

## 2013-06-29 NOTE — Patient Instructions (Signed)
   Stop Plavix Continue all other medications.   Follow up in  3 months

## 2013-06-29 NOTE — Progress Notes (Signed)
Clinical Summary Craig Castillo is a 80 y.o.male  1. Chest pain/CAD  - Prior lexiscan MPI that showed moderate area of ischemia in the inferolateral wall, overall intermediate risk. Had been medically managed due to advanced kidney disease and history of bladder tumor - recent admit to [facility] with NSTEMI Jan 2015, cath as described below,received DES to LCX. Noted RCA disease with recs for medical management at this time to conserve dye load and renal function.  - since discharge no chest pain. No SOB or DOE - compliant with meds, has accidentally been taking asa, plavix, and brillinta. - Echo Jan 2015 LVEF 60-65%  2. CKD stage IV  - AKI following cath with Cr from 2.7 up to 3.1, hydrated aggressively.  - Cr at discharge 2.68 (at admission 2.74) - saw Dr. Lowanda Foster Jan 28th, checked blood work however we do not have these results - patient reports he is being considered for dialysis but has not been committed to HD yet.   3. HTN  - does not check regularly  - compliant with meds   4. DM  - followed by Dr Gerarda Fraction   5. Bladder tumor  - followed by urology, hematuria has resolved. Reports multiple prior surgeries, with most recent Dec 2014. Per notes scope showed suspicious area on right bladder that was biopsided and fulgurated. He has not had any hematuria since.   6. Hyperlipidemia  - on gemfibrozil and zetia, followed Dr Gerarda Fraction.  - his statin history is unclear, appears he was on zocor a few years ago but from our records I cannot determine why it was stopped. The patient does not recall either.   Past Medical History  Diagnosis Date  . Dyslipidemia   . DJD (degenerative joint disease)   . Hypertension   . Diabetes mellitus   . BPH (benign prostatic hyperplasia)   . CKD (chronic kidney disease) stage 3, GFR 30-59 ml/min   . Coronary artery disease     a. 1997 s/p MI and prior RCA stenting;  b. 04/2013 MV: inflat ischemia;  c. 05/2013 Cath/PCI: LM 10-20, LAD 30-40p, LCX  95p (3.0x15 Xience DES), 47m, OM1/2 30, RCA 50p diff ISR, 90d (med Rx).  . Bladder cancer   . Stroke      Allergies  Allergen Reactions  . Aspirin     Due to swelling with celecoxib  . Celecoxib Swelling  . Hydrocodone-Acetaminophen Itching  . Morphine Itching  . Niacin Itching  . Piroxicam Itching     Current Outpatient Prescriptions  Medication Sig Dispense Refill  . amLODipine (NORVASC) 10 MG tablet Take 10 mg by mouth daily.      Marland Kitchen aspirin EC 81 MG tablet Take 81 mg by mouth daily.      . calcitRIOL (ROCALTROL) 0.25 MCG capsule Take 1 capsule (0.25 mcg total) by mouth daily.  30 capsule  6  . ezetimibe (ZETIA) 10 MG tablet Take 10 mg by mouth every morning.       . furosemide (LASIX) 80 MG tablet Take 2 tablets (160 mg total) by mouth 2 (two) times daily.  40 tablet  0  . gemfibrozil (LOPID) 600 MG tablet Take 600 mg by mouth 2 (two) times daily.       . insulin NPH (HUMULIN N,NOVOLIN N) 100 UNIT/ML injection Inject 15-20 Units into the skin 2 (two) times daily. Take 20 units SQ QAM and 15 units SQ QPM  1 vial  12  . isosorbide mononitrate (IMDUR)  30 MG 24 hr tablet Take 1 tablet (30 mg total) by mouth daily.  30 tablet  6  . metoprolol (LOPRESSOR) 25 MG tablet Take 0.5 tablets (12.5 mg total) by mouth 2 (two) times daily.  30 tablet  6  . nitroGLYCERIN (NITROSTAT) 0.4 MG SL tablet Place 1 tablet (0.4 mg total) under the tongue as directed. Chest Pain  25 tablet  3  . omeprazole (PRILOSEC) 20 MG capsule Take 20 mg by mouth every morning.       . tamsulosin (FLOMAX) 0.4 MG CAPS capsule Take 0.4 mg by mouth daily.      . Ticagrelor (BRILINTA) 90 MG TABS tablet Take 1 tablet (90 mg total) by mouth 2 (two) times daily.  60 tablet  6   No current facility-administered medications for this visit.     Past Surgical History  Procedure Laterality Date  . Cholecystectomy    . Esophagogastroduodenoscopy  06/08/2002    Soft stricture of the GE  junction with erosive esophagitis. The  stricture was dilated to 82 [place] using a Maloney dilator. Swollen fold at GE junction on the gastric site which was biopsied for histology and was suspicious for sentinel folds.  . Colonoscopy  06/08/2002    Small polyp was oblated via cold biopsy from the cecum and two were snared, one was at the rectosigmoid junction measuring about a centimeter, another one at the rectum which was smaller.  . Transurethral resection of bladder tumor      x7  . Coronary angioplasty  1997    3 stents  . Transurethral resection of bladder tumor  03/15/2012    Procedure: TRANSURETHRAL RESECTION OF BLADDER TUMOR (TURBT);  Surgeon: Marissa Nestle, MD;  Location: [facility];  Service: Urology;  Laterality: N/A;  . Cataract extraction w/phaco  06/20/2012    Procedure: CATARACT EXTRACTION PHACO AND INTRAOCULAR LENS PLACEMENT (IOC);  Surgeon: Tonny Sharda Keddy, MD;  Location: [facility];  Service: Ophthalmology;  Laterality: Right;  CDE=16.59  . Cataract extraction w/phaco Left 07/04/2012    Procedure: CATARACT EXTRACTION PHACO AND INTRAOCULAR LENS PLACEMENT (IOC);  Surgeon: Tonny Aylah Yeary, MD;  Location: [facility];  Service: Ophthalmology;  Laterality: Left;  CDE: 24.33  . Transurethral resection of bladder tumor N/A 09/29/2012    Procedure: TRANSURETHRAL RESECTION OF BLADDER TUMOR (TURBT);  Surgeon: Marissa Nestle, MD;  Location: [facility];  Service: Urology;  Laterality: N/A;  . Multiple tooth extractions    . Cystoscopy with biopsy N/A 04/27/2013    Procedure: CYSTOSCOPY WITH BLADDER BIOPSY & FULGURATION;  Surgeon: Marissa Nestle, MD;  Location: [facility];  Service: Urology;  Laterality: N/A;     Allergies  Allergen Reactions  . Aspirin     Due to swelling with celecoxib  . Celecoxib Swelling  . Hydrocodone-Acetaminophen Itching  . Morphine Itching  . Niacin Itching  . Piroxicam Itching      Family History  Problem Relation Age of Onset  . Stroke Mother 39  . Diabetes Brother 65     Social History Craig Castillo  reports that he quit smoking about 32 years ago. His smoking use included Cigarettes. He has a 10 pack-year smoking history. He has never used smokeless tobacco. Craig Castillo reports that he does not drink alcohol.   Review of Systems CONSTITUTIONAL: No weight loss, fever, chills, weakness or fatigue.  HEENT: Eyes: No visual loss, blurred vision, double vision or yellow sclerae.No hearing loss, sneezing, congestion, runny nose or sore throat.  SKIN: No  rash or itching.  CARDIOVASCULAR: per HPI RESPIRATORY: No shortness of breath, cough or sputum.  GASTROINTESTINAL: No anorexia, nausea, vomiting or diarrhea. No abdominal pain or blood.  GENITOURINARY: No burning on urination, no polyuria NEUROLOGICAL: No headache, dizziness, syncope, paralysis, ataxia, numbness or tingling in the extremities. No change in bowel or bladder control.  MUSCULOSKELETAL: No muscle, back pain, joint pain or stiffness.  LYMPHATICS: No enlarged nodes. No history of splenectomy.  PSYCHIATRIC: No history of depression or anxiety.  ENDOCRINOLOGIC: No reports of sweating, cold or heat intolerance. No polyuria or polydipsia.  Marland Kitchen   Physical Examination p 76 bp 130/70 Wt 208 lbs BMI 32 Gen: resting comfortably, no acute distress HEENT: no scleral icterus, pupils equal round and reactive, no palptable cervical adenopathy,  CV: RRR, no m/r/g, no JVD, right carotid bruit Resp: Clear to auscultation bilaterally GI: abdomen is soft, non-tender, non-distended, normal bowel sounds, no hepatosplenomegaly MSK: extremities are warm, no edema.  Skin: warm, no rash Neuro:  no focal deficits Psych: appropriate affect   Diagnostic Studies 11/2012 Echo: LVEF 55-60%, mild LVH, no WMAs,   11/2012 Carotid US:  Summary: Right: mild to moderate mixed plaque origin and proximal ICA.Left: moderate mixed plaque origin ICA and moderate soft plaque proximal ICA. Bilateral: 0-39% ICA stenosis. Vertebral artery flow is  antegrade.  05/01/13 EKG  Sinus rhythm, incomplete RBBB   05/22/13 Lexiscan MPI  Intermediate risk Lexiscan Cardiolite as outlined. There were no clearly diagnostic ST segment changes, no arrhythmias were noted. Moderate-sized inferolateral ischemic defect noted consistent with potential circumflex or RCA stenosis. LVEF 54% with normal LV volumes.  Cardiac Catheterization 1.16.2015  Procedural Findings:  Hemodynamics:  AO 143/74 mean 106 mm Hg  LV 144/24 mm Hg  Coronary angiography:  Coronary dominance: right  Left mainstem: Mild tapering distal to 10-20%.  Left anterior descending (LAD): The proximal LAD is heavily calcified. There is diffuse 30-40% disease in the proximal vessel. There are multiple small diagonals without significant disease.  Left circumflex (LCx): The LCx gives off 2 OM branches that are moderate in size. The proximal LCx is heavily calcified with segmental 95% stenosis. The mid LCx and both OMs have mild disease diffusely to 30%.  Right coronary artery (RCA): The RCA appears to have stents from the proximal vessel to past the crux. There is diffuse calcification. There is diffuse 50% disease throughout the stented segment. There is a focal 90% stenosis at the crux that appears to be within the stent.  Left ventriculography: Not done.  Final Conclusions:  1. 2 vessel obstructive CAD. The culprit lesion appears to be the proximal LCx. The RCA has extensive stenting with moderate diffuse in stent disease and a focal area of severe stenosis at the crux.  Recommendations: Recommend hydration over the weekend with close monitoring of renal function. Given complexity of disease I would recommend staging PCI on Monday. Will load with Brilinta. I anticipate treating the LCx will require rotational atherectomy given the heavy calcification and I would recommend DES since he has complex lesion and CKD given high risk of restenosis. It may be best to treat the culprit vessel only  then follow on medical therapy for the RCA disease.  _____________  Percutaneous Coronary Intervention 1.19.2015  Conclusions: Successful PCI of the LCx with a 3.0 x 15 mm Xience DES using adjunctive rotational atherectomy  Recommendations: ASA, Brilinta at least 12 months, aggressive med Rx for residual CAD.     Assessment and Plan   1. Chest pain/CAD  -  recent NSTEMI, s/p DES to LCX. Medically managing RCA disease. - he has has accidentally continued his plavix while being on ASA and brillinta, will stop plavix  2. HTN - at goal, continue current meds  3. Hyperlipidemia  - unclear statin history, he is on zetia and gemfibrozil so I presume he had some form of adverse reaction to statin previously. I do not have these records. I have requested the patient discuss with his PCP at there visit tomorrow. If no strong contraindication recommend high dose statin therapy.        Arnoldo Lenis, M.D., F.A.C.C.

## 2013-08-17 ENCOUNTER — Telehealth: Payer: Self-pay | Admitting: Cardiology

## 2013-08-17 MED ORDER — NITROGLYCERIN 0.4 MG SL SUBL: 0.4000 mg | SUBLINGUAL_TABLET | SUBLINGUAL | Status: DC

## 2013-08-17 NOTE — Telephone Encounter (Signed)
Refill request complete 

## 2013-08-17 NOTE — Telephone Encounter (Signed)
Received fax refill request  Rx # [id number] Medication:  Nitrostat 0.4mg  4x25 tab Qty 25 Sig:  Place 1 tab under tongue every 5 min if needed for chest pain.  May use 3 times. No relief call 911 Physician:  Harl Bowie

## 2013-08-23 ENCOUNTER — Ambulatory Visit (HOSPITAL_COMMUNITY)
Admission: RE | Admit: 2013-08-23 | Discharge: 2013-08-23 | Disposition: A | Discharge status: Home / Self Care | Payer: PRIVATE HEALTH INSURANCE | Source: Ambulatory Visit | Attending: Nephrology | Admitting: Nephrology

## 2013-08-23 ENCOUNTER — Other Ambulatory Visit (HOSPITAL_COMMUNITY): Payer: Self-pay | Admitting: Nephrology

## 2013-08-23 DIAGNOSIS — N1330 Unspecified hydronephrosis: Secondary | ICD-10-CM | POA: Insufficient documentation

## 2013-08-23 DIAGNOSIS — N289 Disorder of kidney and ureter, unspecified: Secondary | ICD-10-CM

## 2013-08-23 DIAGNOSIS — R319 Hematuria, unspecified: Secondary | ICD-10-CM | POA: Insufficient documentation

## 2013-08-23 DIAGNOSIS — Q619 Cystic kidney disease, unspecified: Secondary | ICD-10-CM | POA: Insufficient documentation

## 2013-08-24 ENCOUNTER — Other Ambulatory Visit (HOSPITAL_COMMUNITY): Payer: Self-pay | Admitting: Urology

## 2013-08-24 DIAGNOSIS — N23 Unspecified renal colic: Secondary | ICD-10-CM

## 2013-08-24 DIAGNOSIS — C679 Malignant neoplasm of bladder, unspecified: Secondary | ICD-10-CM

## 2013-08-28 ENCOUNTER — Other Ambulatory Visit (INDEPENDENT_AMBULATORY_CARE_PROVIDER_SITE_OTHER): Payer: Self-pay | Admitting: Otolaryngology

## 2013-08-29 ENCOUNTER — Ambulatory Visit (HOSPITAL_COMMUNITY)
Admission: RE | Admit: 2013-08-29 | Discharge: 2013-08-29 | Disposition: A | Discharge status: Home / Self Care | Payer: PRIVATE HEALTH INSURANCE | Source: Ambulatory Visit | Attending: Urology | Admitting: Urology

## 2013-08-29 DIAGNOSIS — N289 Disorder of kidney and ureter, unspecified: Secondary | ICD-10-CM | POA: Insufficient documentation

## 2013-08-29 DIAGNOSIS — C679 Malignant neoplasm of bladder, unspecified: Secondary | ICD-10-CM

## 2013-08-29 DIAGNOSIS — N23 Unspecified renal colic: Secondary | ICD-10-CM

## 2013-08-29 DIAGNOSIS — Z8551 Personal history of malignant neoplasm of bladder: Secondary | ICD-10-CM | POA: Insufficient documentation

## 2013-08-29 DIAGNOSIS — N329 Bladder disorder, unspecified: Secondary | ICD-10-CM | POA: Insufficient documentation

## 2013-09-26 ENCOUNTER — Ambulatory Visit (INDEPENDENT_AMBULATORY_CARE_PROVIDER_SITE_OTHER): Payer: PRIVATE HEALTH INSURANCE | Admitting: Cardiology

## 2013-09-26 ENCOUNTER — Encounter: Payer: Self-pay | Admitting: Cardiology

## 2013-09-26 VITALS — BP 165/67 | HR 70 | Ht 68.0 in | Wt 219.0 lb

## 2013-09-26 DIAGNOSIS — E785 Hyperlipidemia, unspecified: Secondary | ICD-10-CM

## 2013-09-26 DIAGNOSIS — I2510 Atherosclerotic heart disease of native coronary artery without angina pectoris: Secondary | ICD-10-CM

## 2013-09-26 DIAGNOSIS — I10 Essential (primary) hypertension: Secondary | ICD-10-CM

## 2013-09-26 MED ORDER — METOPROLOL TARTRATE 25 MG PO TABS: 12.5000 mg | ORAL_TABLET | Freq: Two times a day (BID) | ORAL | Status: DC

## 2013-09-26 NOTE — Patient Instructions (Signed)
   Begin Metoprolol Tart 12.5mg  twice a day  - new sent to pharm Continue all other medications.   Follow up in  3 months

## 2013-09-26 NOTE — Progress Notes (Signed)
Clinical Summary Mr. Mcduffee is a 80 y.o.male seen today for follow up for the following medical problems.   1. Chest pain/CAD  - Prior lexiscan MPI that showed moderate area of ischemia in the inferolateral wall, overall intermediate risk. Had been medically managed due to advanced kidney disease and history of bladder tumor  - recent admit to [facility] with NSTEMI Jan 2015, cath as described below,received DES to LCX. Noted RCA disease with recs for medical management at this time to conserve dye load and renal function.  - Echo Jan 2015 LVEF 60-65%   - since discharge no chest pain. No SOB or DOE  - compliant with meds. We talked to him about stopping plavix as he was already on ASA and brillinta, but he has accidentally continued to take all 3.  - he is being considered for upcoming uro surgery   .  2. HTN  - does not check regularly  - compliant with meds, he accidentally stopped taking his metoprolol  3. DM  - followed by Dr Gerarda Fraction   5. Bladder tumor  - followed by urology. Reports multiple prior surgeries.  - he is being considered for a repeat uro procedure for obstructive bladder tumor  6. Hyperlipidemia  - on gemfibrozil and zetia, followed Dr Gerarda Fraction.  - his statin history is unclear, appears he was on zocor a few years ago but from our records I cannot determine why it was stopped. The patient does not recall either.  - reports he has appt with pcp tomorrow Past Medical History  Diagnosis Date  . Dyslipidemia   . DJD (degenerative joint disease)   . Hypertension   . Diabetes mellitus   . BPH (benign prostatic hyperplasia)   . CKD (chronic kidney disease) stage 3, GFR 30-59 ml/min   . Coronary artery disease     a. 1997 s/p MI and prior RCA stenting;  b. 04/2013 MV: inflat ischemia;  c. 05/2013 Cath/PCI: LM 10-20, LAD 30-40p, LCX 95p (3.0x15 Xience DES), 46m, OM1/2 30, RCA 50p diff ISR, 90d (med Rx).  . Bladder cancer   . Stroke      Allergies  Allergen  Reactions  . Aspirin     Due to swelling with celecoxib  . Celecoxib Swelling  . Hydrocodone-Acetaminophen Itching  . Morphine Itching  . Niacin Itching  . Piroxicam Itching     Current Outpatient Prescriptions  Medication Sig Dispense Refill  . amLODipine (NORVASC) 10 MG tablet Take 10 mg by mouth daily.      Marland Kitchen aspirin EC 81 MG tablet Take 81 mg by mouth daily.      . calcitRIOL (ROCALTROL) 0.25 MCG capsule Take 1 capsule (0.25 mcg total) by mouth daily.  30 capsule  6  . cholecalciferol (VITAMIN D) 1000 UNITS tablet Take 1,000 Units by mouth daily.      Marland Kitchen Dextromethorphan-Guaifenesin (MUCINEX DM PO) Take by mouth as directed.      . ezetimibe (ZETIA) 10 MG tablet Take 10 mg by mouth every morning.       . furosemide (LASIX) 80 MG tablet Take 2 tablets (160 mg total) by mouth 2 (two) times daily.  40 tablet  0  . gemfibrozil (LOPID) 600 MG tablet Take 600 mg by mouth 2 (two) times daily.       . insulin NPH (HUMULIN N,NOVOLIN N) 100 UNIT/ML injection Inject 15-20 Units into the skin 2 (two) times daily. Take 20 units SQ QAM and 15 units  SQ QPM  1 vial  12  . isosorbide mononitrate (IMDUR) 30 MG 24 hr tablet Take 1 tablet (30 mg total) by mouth daily.  30 tablet  6  . metoprolol (LOPRESSOR) 25 MG tablet Take 0.5 tablets (12.5 mg total) by mouth 2 (two) times daily.  30 tablet  6  . nitroGLYCERIN (NITROSTAT) 0.4 MG SL tablet Place 1 tablet (0.4 mg total) under the tongue as directed. Chest Pain  25 tablet  1  . omeprazole (PRILOSEC) 20 MG capsule Take 20 mg by mouth every morning.       . tamsulosin (FLOMAX) 0.4 MG CAPS capsule Take 0.4 mg by mouth daily.      . Ticagrelor (BRILINTA) 90 MG TABS tablet Take 1 tablet (90 mg total) by mouth 2 (two) times daily.  60 tablet  6   No current facility-administered medications for this visit.     Past Surgical History  Procedure Laterality Date  . Cholecystectomy    . Esophagogastroduodenoscopy  06/08/2002    Soft stricture of the GE   junction with erosive esophagitis. The stricture was dilated to 71 [place] using a Maloney dilator. Swollen fold at GE junction on the gastric site which was biopsied for histology and was suspicious for sentinel folds.  . Colonoscopy  06/08/2002    Small polyp was oblated via cold biopsy from the cecum and two were snared, one was at the rectosigmoid junction measuring about a centimeter, another one at the rectum which was smaller.  . Transurethral resection of bladder tumor      x7  . Coronary angioplasty  1997    3 stents  . Transurethral resection of bladder tumor  03/15/2012    Procedure: TRANSURETHRAL RESECTION OF BLADDER TUMOR (TURBT);  Surgeon: Marissa Nestle, MD;  Location: [facility];  Service: Urology;  Laterality: N/A;  . Cataract extraction w/phaco  06/20/2012    Procedure: CATARACT EXTRACTION PHACO AND INTRAOCULAR LENS PLACEMENT (IOC);  Surgeon: Tonny Branch, MD;  Location: [facility];  Service: Ophthalmology;  Laterality: Right;  CDE=16.59  . Cataract extraction w/phaco Left 07/04/2012    Procedure: CATARACT EXTRACTION PHACO AND INTRAOCULAR LENS PLACEMENT (IOC);  Surgeon: Tonny Branch, MD;  Location: [facility];  Service: Ophthalmology;  Laterality: Left;  CDE: 24.33  . Transurethral resection of bladder tumor N/A 09/29/2012    Procedure: TRANSURETHRAL RESECTION OF BLADDER TUMOR (TURBT);  Surgeon: Marissa Nestle, MD;  Location: [facility];  Service: Urology;  Laterality: N/A;  . Multiple tooth extractions    . Cystoscopy with biopsy N/A 04/27/2013    Procedure: CYSTOSCOPY WITH BLADDER BIOPSY & FULGURATION;  Surgeon: Marissa Nestle, MD;  Location: [facility];  Service: Urology;  Laterality: N/A;     Allergies  Allergen Reactions  . Aspirin     Due to swelling with celecoxib  . Celecoxib Swelling  . Hydrocodone-Acetaminophen Itching  . Morphine Itching  . Niacin Itching  . Piroxicam Itching      Family History  Problem Relation Age of Onset  . Stroke Mother 30  . Diabetes Brother 59      Social History Mr. Fetch reports that he quit smoking about 32 years ago. His smoking use included Cigarettes. He has a 10 pack-year smoking history. He has never used smokeless tobacco. Mr. Flitter reports that he does not drink alcohol.   Review of Systems CONSTITUTIONAL: No weight loss, fever, chills, weakness or fatigue.  HEENT: Eyes: No visual loss, blurred vision, double vision or yellow sclerae.No  hearing loss, sneezing, congestion, runny nose or sore throat.  SKIN: No rash or itching.  CARDIOVASCULAR: per HPI RESPIRATORY: No shortness of breath, cough or sputum.  GASTROINTESTINAL: No anorexia, nausea, vomiting or diarrhea. No abdominal pain or blood.  GENITOURINARY: No burning on urination, no polyuria NEUROLOGICAL: No headache, dizziness, syncope, paralysis, ataxia, numbness or tingling in the extremities. No change in bowel or bladder control.  MUSCULOSKELETAL: No muscle, back pain, joint pain or stiffness.  LYMPHATICS: No enlarged nodes. No history of splenectomy.  PSYCHIATRIC: No history of depression or anxiety.  ENDOCRINOLOGIC: No reports of sweating, cold or heat intolerance. No polyuria or polydipsia.  Marland Kitchen   Physical Examination p 70 bp 140/70 Wt 219 lbs BMI 33 Gen: resting comfortably, no acute distress HEENT: no scleral icterus, pupils equal round and reactive, no palptable cervical adenopathy,  CV: RRR, no m/r/g, no JVD, no carotid bruits Resp: Clear to auscultation bilaterally GI: abdomen is soft, non-tender, non-distended, normal bowel sounds, no hepatosplenomegaly MSK: extremities are warm, no edema.  Skin: warm, no rash Neuro:  no focal deficits Psych: appropriate affect   Diagnostic Studies 11/2012 Echo: LVEF 55-60%, mild LVH, no WMAs,  11/2012 Carotid US:  Summary: Right: mild to moderate mixed plaque origin and proximal ICA.Left: moderate mixed plaque origin ICA and moderate soft plaque proximal ICA. Bilateral: 0-39% ICA stenosis. Vertebral  artery flow is antegrade.  05/01/13 EKG  Sinus rhythm, incomplete RBBB  05/22/13 Lexiscan MPI  Intermediate risk Lexiscan Cardiolite as outlined. There were no clearly diagnostic ST segment changes, no arrhythmias were noted. Moderate-sized inferolateral ischemic defect noted consistent with potential circumflex or RCA stenosis. LVEF 54% with normal LV volumes.  Cardiac Catheterization 1.16.2015  Procedural Findings:  Hemodynamics:  AO 143/74 mean 106 mm Hg  LV 144/24 mm Hg  Coronary angiography:  Coronary dominance: right  Left mainstem: Mild tapering distal to 10-20%.  Left anterior descending (LAD): The proximal LAD is heavily calcified. There is diffuse 30-40% disease in the proximal vessel. There are multiple small diagonals without significant disease.  Left circumflex (LCx): The LCx gives off 2 OM branches that are moderate in size. The proximal LCx is heavily calcified with segmental 95% stenosis. The mid LCx and both OMs have mild disease diffusely to 30%.  Right coronary artery (RCA): The RCA appears to have stents from the proximal vessel to past the crux. There is diffuse calcification. There is diffuse 50% disease throughout the stented segment. There is a focal 90% stenosis at the crux that appears to be within the stent.  Left ventriculography: Not done.  Final Conclusions:  1. 2 vessel obstructive CAD. The culprit lesion appears to be the proximal LCx. The RCA has extensive stenting with moderate diffuse in stent disease and a focal area of severe stenosis at the crux.  Recommendations: Recommend hydration over the weekend with close monitoring of renal function. Given complexity of disease I would recommend staging PCI on Monday. Will load with Brilinta. I anticipate treating the LCx will require rotational atherectomy given the heavy calcification and I would recommend DES since he has complex lesion and CKD given high risk of restenosis. It may be best to treat the culprit  vessel only then follow on medical therapy for the RCA disease.  _____________  Percutaneous Coronary Intervention 1.19.2015  Conclusions: Successful PCI of the LCx with a 3.0 x 15 mm Xience DES using adjunctive rotational atherectomy  Recommendations: ASA, Brilinta at least 12 months, aggressive med Rx for residual CAD.  Assessment and Plan  1. Chest pain/CAD  - recent NSTEMI, s/p DES to LCX Jan 2015. Medically managing RCA disease.  - he has has accidentally continued his plavix while being on ASA and brillinta, will stop plavix  - he is being considered for possible uro procedure, he is very high risk 4 months out to stop his dual antiplatelet therapy. Recommend waiting minimum of 6 months.   2. HTN - elevated, will restart his metoprolol   3. Hyperlipidemia  - unclear statin history, he is on zetia and gemfibrozil so I presume he had some form of adverse reaction to statin previously. I do not have these records. I have requested the patient discuss with his PCP at there visit tomorrow. If no strong contraindication recommend high dose statin therapy.    4. Bladder tumor - cardiac concerns as described above with recent drug eluting stent. Strongly recommend at least 6 months of therapy prior to stopping dual antiplatelet agent. If procedure can be done on this therapy ok from cardiac standpoint to proceed.    F/u 3 months   Arnoldo Lenis, M.D., F.A.C.C.

## 2013-10-11 ENCOUNTER — Other Ambulatory Visit: Payer: Self-pay | Admitting: Cardiology

## 2013-10-13 ENCOUNTER — Telehealth: Payer: Self-pay | Admitting: *Deleted

## 2013-10-13 NOTE — Telephone Encounter (Signed)
Message copied by Laurine Blazer on Fri Oct 13, 2013  3:33 PM ------      Message from: [place] F      Created: Wed Oct 11, 2013  4:07 PM       Urology surgery in July is ok (that is 6 months after last stent). If they want to stop his brillinta for the procedure he will need to be off at least 5 days. It is preferred that he continue his ASA through the procedure. Resume brillinta one day after procedure. May forward this correspondence to his urologist.            Carlyle Dolly MD ------

## 2013-10-13 NOTE — Telephone Encounter (Signed)
Patient notified.  Will forward this note to Azucena Kuba, CNA (surgery coordinator) in the Urology Dept at [facility].

## 2013-10-18 ENCOUNTER — Telehealth: Payer: Self-pay | Admitting: Cardiology

## 2013-10-18 MED ORDER — NITROGLYCERIN 0.4 MG SL SUBL: 0.4000 mg | SUBLINGUAL_TABLET | SUBLINGUAL | Status: DC

## 2013-10-18 NOTE — Telephone Encounter (Signed)
Received fax refill request  Rx # [id number] Medication:  Nitrostat 0.4mg  4X25 tab Qty 25 tab Sig:  Place 1 tab under tongue every 5 min if needed for chest pain. May use 3 times. No relief call 911. Physician:  Harl Bowie

## 2013-10-18 NOTE — Telephone Encounter (Signed)
Done

## 2013-12-09 ENCOUNTER — Emergency Department (HOSPITAL_COMMUNITY)
Admission: EM | Admit: 2013-12-09 | Discharge: 2013-12-09 | Disposition: A | Discharge status: Home / Self Care | Payer: PRIVATE HEALTH INSURANCE | Attending: Emergency Medicine | Admitting: Emergency Medicine

## 2013-12-09 ENCOUNTER — Encounter (HOSPITAL_COMMUNITY): Payer: Self-pay | Admitting: Emergency Medicine

## 2013-12-09 DIAGNOSIS — T83098A Other mechanical complication of other indwelling urethral catheter, initial encounter: Secondary | ICD-10-CM

## 2013-12-09 DIAGNOSIS — IMO0002 Reserved for concepts with insufficient information to code with codable children: Secondary | ICD-10-CM | POA: Insufficient documentation

## 2013-12-09 DIAGNOSIS — M1990 Unspecified osteoarthritis, unspecified site: Secondary | ICD-10-CM | POA: Diagnosis not present

## 2013-12-09 DIAGNOSIS — I129 Hypertensive chronic kidney disease with stage 1 through stage 4 chronic kidney disease, or unspecified chronic kidney disease: Secondary | ICD-10-CM | POA: Diagnosis not present

## 2013-12-09 DIAGNOSIS — N400 Enlarged prostate without lower urinary tract symptoms: Secondary | ICD-10-CM | POA: Insufficient documentation

## 2013-12-09 DIAGNOSIS — E119 Type 2 diabetes mellitus without complications: Secondary | ICD-10-CM | POA: Insufficient documentation

## 2013-12-09 DIAGNOSIS — Z87891 Personal history of nicotine dependence: Secondary | ICD-10-CM | POA: Diagnosis not present

## 2013-12-09 DIAGNOSIS — Z8551 Personal history of malignant neoplasm of bladder: Secondary | ICD-10-CM | POA: Insufficient documentation

## 2013-12-09 DIAGNOSIS — Z794 Long term (current) use of insulin: Secondary | ICD-10-CM | POA: Diagnosis not present

## 2013-12-09 DIAGNOSIS — N183 Chronic kidney disease, stage 3 unspecified: Secondary | ICD-10-CM | POA: Insufficient documentation

## 2013-12-09 DIAGNOSIS — Z9889 Other specified postprocedural states: Secondary | ICD-10-CM | POA: Diagnosis not present

## 2013-12-09 DIAGNOSIS — E785 Hyperlipidemia, unspecified: Secondary | ICD-10-CM | POA: Diagnosis not present

## 2013-12-09 DIAGNOSIS — I2510 Atherosclerotic heart disease of native coronary artery without angina pectoris: Secondary | ICD-10-CM | POA: Diagnosis not present

## 2013-12-09 DIAGNOSIS — Z79899 Other long term (current) drug therapy: Secondary | ICD-10-CM | POA: Insufficient documentation

## 2013-12-09 DIAGNOSIS — Y833 Surgical operation with formation of external stoma as the cause of abnormal reaction of the patient, or of later complication, without mention of misadventure at the time of the procedure: Secondary | ICD-10-CM | POA: Insufficient documentation

## 2013-12-09 DIAGNOSIS — Z7982 Long term (current) use of aspirin: Secondary | ICD-10-CM | POA: Insufficient documentation

## 2013-12-09 DIAGNOSIS — Z7902 Long term (current) use of antithrombotics/antiplatelets: Secondary | ICD-10-CM | POA: Insufficient documentation

## 2013-12-09 DIAGNOSIS — Z8673 Personal history of transient ischemic attack (TIA), and cerebral infarction without residual deficits: Secondary | ICD-10-CM | POA: Diagnosis not present

## 2013-12-09 DIAGNOSIS — N9989 Other postprocedural complications and disorders of genitourinary system: Secondary | ICD-10-CM | POA: Insufficient documentation

## 2013-12-09 NOTE — Discharge Instructions (Signed)
Please keep the dressing intact and dry. Assess for ongoing output of tubes. If they appear blocked, please call your urologist. Please recheck with your urologist as scheduled.

## 2013-12-09 NOTE — ED Notes (Signed)
Pt had kidney surgery on Monday. Pt states tube is not working correct to left kidney. Surgery was performed at [facility].

## 2013-12-09 NOTE — ED Notes (Signed)
Slow draining noted where distal cath meets proximal indwelling cath bag. Area flushed and is better. Urine clear and amber. EDP aware. No pain. Areas redressed

## 2013-12-09 NOTE — ED Provider Notes (Signed)
CSN: [id number]     Arrival date & time 12/09/13  6270 History   First MD Initiated Contact with Patient 12/09/13 [phone]     Chief Complaint  Patient presents with  . Post-op Problem     (Consider location/radiation/quality/duration/timing/severity/associated sxs/prior Treatment) HPI 80 year old male presents today after bilateral nephrostomy tube placement 6 days ago at [facility]. He then had bladder biopsies done the next day. He was discharged home 2 days ago and has been doing well. He states that he thinks that the dressing came off of one of his tubes last night and he is not sure it is draining normally today. He thinks he may have pulled it out. He states this occurred was in the shower and the dressing got wet. He has not noted any fever or abdominal discomfort. He states that the biopsies were negative for revealing any further bladder cancer. He has been followed by Dr. Geraldo Pitter locally. Past Medical History  Diagnosis Date  . Dyslipidemia   . DJD (degenerative joint disease)   . Hypertension   . Diabetes mellitus   . BPH (benign prostatic hyperplasia)   . CKD (chronic kidney disease) stage 3, GFR 30-59 ml/min   . Coronary artery disease     a. 1997 s/p MI and prior RCA stenting;  b. 04/2013 MV: inflat ischemia;  c. 05/2013 Cath/PCI: LM 10-20, LAD 30-40p, LCX 95p (3.0x15 Xience DES), 67m, OM1/2 30, RCA 50p diff ISR, 90d (med Rx).  . Bladder cancer   . Stroke    Past Surgical History  Procedure Laterality Date  . Cholecystectomy    . Esophagogastroduodenoscopy  06/08/2002    Soft stricture of the GE  junction with erosive esophagitis. The stricture was dilated to 34 [place] using a Maloney dilator. Swollen fold at GE junction on the gastric site which was biopsied for histology and was suspicious for sentinel folds.  . Colonoscopy  06/08/2002    Small polyp was oblated via cold biopsy from the cecum and two were snared, one was at the rectosigmoid junction measuring about a  centimeter, another one at the rectum which was smaller.  . Transurethral resection of bladder tumor      x7  . Coronary angioplasty  1997    3 stents  . Transurethral resection of bladder tumor  03/15/2012    Procedure: TRANSURETHRAL RESECTION OF BLADDER TUMOR (TURBT);  Surgeon: Marissa Nestle, MD;  Location: [facility];  Service: Urology;  Laterality: N/A;  . Cataract extraction w/phaco  06/20/2012    Procedure: CATARACT EXTRACTION PHACO AND INTRAOCULAR LENS PLACEMENT (IOC);  Surgeon: Tonny Branch, MD;  Location: [facility];  Service: Ophthalmology;  Laterality: Right;  CDE=16.59  . Cataract extraction w/phaco Left 07/04/2012    Procedure: CATARACT EXTRACTION PHACO AND INTRAOCULAR LENS PLACEMENT (IOC);  Surgeon: Tonny Branch, MD;  Location: [facility];  Service: Ophthalmology;  Laterality: Left;  CDE: 24.33  . Transurethral resection of bladder tumor N/A 09/29/2012    Procedure: TRANSURETHRAL RESECTION OF BLADDER TUMOR (TURBT);  Surgeon: Marissa Nestle, MD;  Location: [facility];  Service: Urology;  Laterality: N/A;  . Multiple tooth extractions    . Cystoscopy with biopsy N/A 04/27/2013    Procedure: CYSTOSCOPY WITH BLADDER BIOPSY & FULGURATION;  Surgeon: Marissa Nestle, MD;  Location: [facility];  Service: Urology;  Laterality: N/A;   Family History  Problem Relation Age of Onset  . Stroke Mother 64  . Diabetes Brother 65   History  Substance Use Topics  .  Smoking status: Former Smoker -- 0.50 packs/day for 20 years    Types: Cigarettes    Quit date: 03/08/1981  . Smokeless tobacco: Never Used  . Alcohol Use: No    Review of Systems  All other systems reviewed and are negative.     Allergies  Aspirin; Celecoxib; Hydrocodone-acetaminophen; Morphine; Niacin; and Piroxicam  Home Medications   Prior to Admission medications   Medication Sig Start Date End Date Taking? Authorizing Provider  amLODipine (NORVASC) 10 MG tablet Take 10 mg by mouth daily.    Historical Provider, MD  aspirin EC 81  MG tablet Take 81 mg by mouth daily.    Historical Provider, MD  calcitRIOL (ROCALTROL) 0.25 MCG capsule Take 1 capsule (0.25 mcg total) by mouth daily. 06/14/13   Coral Spikes, DO  ezetimibe (ZETIA) 10 MG tablet Take 10 mg by mouth every morning.     Historical Provider, MD  gemfibrozil (LOPID) 600 MG tablet Take 600 mg by mouth 2 (two) times daily.     Historical Provider, MD  insulin NPH (HUMULIN N,NOVOLIN N) 100 UNIT/ML injection Inject 15-20 Units into the skin 2 (two) times daily. Take 20 units SQ QAM and 15 units SQ QPM 04/28/13   Kathie Dike, MD  isosorbide mononitrate (IMDUR) 30 MG 24 hr tablet Take 1 tablet (30 mg total) by mouth daily. 06/06/13   Arnoldo Lenis, MD  metoprolol tartrate (LOPRESSOR) 25 MG tablet Take 0.5 tablets (12.5 mg total) by mouth 2 (two) times daily. 09/26/13   Arnoldo Lenis, MD  nitroGLYCERIN (NITROSTAT) 0.4 MG SL tablet Place 1 tablet (0.4 mg total) under the tongue as directed. Chest Pain 10/18/13   Arnoldo Lenis, MD  omeprazole (PRILOSEC) 20 MG capsule Take 20 mg by mouth every morning.     Historical Provider, MD  tamsulosin (FLOMAX) 0.4 MG CAPS capsule Take 0.4 mg by mouth daily.    Historical Provider, MD  Ticagrelor (BRILINTA) 90 MG TABS tablet Take 1 tablet (90 mg total) by mouth 2 (two) times daily. 06/13/13   Rogelia Mire, NP   BP 136/56  Pulse 75  Temp(Src) 97.5 F (36.4 C) (Oral)  Resp 20  SpO2 100% Physical Exam  Nursing note and vitals reviewed. Constitutional: He is oriented to person, place, and time. He appears well-developed and well-nourished. No distress.  HENT:  Head: Normocephalic and atraumatic.  Right Ear: External ear normal.  Left Ear: External ear normal.  Nose: Nose normal.  Mouth/Throat: Oropharynx is clear and moist.  Eyes: Conjunctivae are normal. Pupils are equal, round, and reactive to light.  Neck: Normal range of motion. Neck supple.  Cardiovascular: Normal rate, regular rhythm and normal heart sounds.    Pulmonary/Chest: Effort normal and breath sounds normal.  Abdominal: Soft. Bowel sounds are normal.  Genitourinary:  Bilateral nephrostomy placement sites examined. Both reveal tube sutured into position. Bilateral tubes appear to be draining with more urine noted in the bag on the right than on the left.. Tubular erythema or tenderness, or discharge noted  Musculoskeletal: Normal range of motion. He exhibits no edema and no tenderness.  Neurological: He is alert and oriented to person, place, and time. He has normal reflexes. No cranial nerve deficit. Coordination normal.  Skin: Skin is warm and dry. No rash noted. No erythema.  Psychiatric: He has a normal mood and affect. His behavior is normal. Thought content normal.    ED Course  Procedures (including critical care time) Labs Review Labs Reviewed -  No data to display  Imaging Review No results found.   EKG Interpretation None      MDM   Final diagnoses:  Malfunction of nephrostomy tube   80 year old man presents status post nephrostomy tube placement 6 days ago with concerns that he has dislodged his nephrostomy tube. Tubes appear to be  be in place and draining well bilaterally. Dressings placed. The patient encouraged to return if he has any further problems and to followup with his urologist who placed the tubes on Monday.  RN flushed tubes and both appear to be draining well. I have discussed return precautions and need for followup with the patient and wife and they voice understanding  Shaune Pollack, MD 12/09/13 [phone]

## 2013-12-12 ENCOUNTER — Ambulatory Visit (INDEPENDENT_AMBULATORY_CARE_PROVIDER_SITE_OTHER): Payer: PRIVATE HEALTH INSURANCE | Admitting: Cardiology

## 2013-12-12 ENCOUNTER — Encounter: Payer: Self-pay | Admitting: Cardiology

## 2013-12-12 VITALS — BP 160/83 | HR 79 | Ht 68.0 in | Wt 213.0 lb

## 2013-12-12 DIAGNOSIS — I10 Essential (primary) hypertension: Secondary | ICD-10-CM

## 2013-12-12 DIAGNOSIS — I2510 Atherosclerotic heart disease of native coronary artery without angina pectoris: Secondary | ICD-10-CM

## 2013-12-12 MED ORDER — ISOSORBIDE MONONITRATE ER 60 MG PO TB24: 60.0000 mg | ORAL_TABLET | Freq: Every day | ORAL | Status: DC

## 2013-12-12 NOTE — Progress Notes (Signed)
Clinical Summary Craig Castillo is a 80 y.o.male  1. Chest pain/CAD  - Prior lexiscan MPI that showed moderate area of ischemia in the inferolateral wall, overall intermediate risk. Had been medically managed due to advanced kidney disease and history of bladder tumor  - recent admit to [facility] with NSTEMI Jan 2015, cath as described below,received DES to LCX. Noted RCA disease with recs for medical management at this time to conserve dye load and renal function.  - Echo Jan 2015 LVEF 60-65%  - since discharge no chest pain. No SOB or DOE  - compliant with meds.  - he had a recent urological procedure with nephrostomy tube placement, he had been on brillinta at least 6 months prior to procedure so we ok'd holding for procedure. He is back on.   2. HTN  - does not check regularly  - compliant with meds  3. DM  - followed by Dr Gerarda Fraction    4 . Hyperlipidemia  - on gemfibrozil and zetia, followed Dr Gerarda Fraction.  - his statin history is unclear, appears he was on zocor a few years ago but from our records I cannot determine why it was stopped. The patient does not recall either.  - reports he has appt with pcp tomorrow      Past Medical History  Diagnosis Date  . Dyslipidemia   . DJD (degenerative joint disease)   . Hypertension   . Diabetes mellitus   . BPH (benign prostatic hyperplasia)   . CKD (chronic kidney disease) stage 3, GFR 30-59 ml/min   . Coronary artery disease     a. 1997 s/p MI and prior RCA stenting;  b. 04/2013 MV: inflat ischemia;  c. 05/2013 Cath/PCI: LM 10-20, LAD 30-40p, LCX 95p (3.0x15 Xience DES), 61m, OM1/2 30, RCA 50p diff ISR, 90d (med Rx).  . Bladder cancer   . Stroke      Allergies  Allergen Reactions  . Aspirin     Due to swelling with celecoxib  . Celecoxib Swelling  . Hydrocodone-Acetaminophen Itching  . Morphine Itching  . Niacin Itching  . Piroxicam Itching     Current Outpatient Prescriptions  Medication Sig Dispense Refill  .  amLODipine (NORVASC) 10 MG tablet Take 10 mg by mouth daily.      Marland Kitchen aspirin EC 81 MG tablet Take 81 mg by mouth daily.      . calcitRIOL (ROCALTROL) 0.25 MCG capsule Take 1 capsule (0.25 mcg total) by mouth daily.  30 capsule  6  . ezetimibe (ZETIA) 10 MG tablet Take 10 mg by mouth every morning.       Marland Kitchen gemfibrozil (LOPID) 600 MG tablet Take 600 mg by mouth 2 (two) times daily.       . insulin NPH (HUMULIN N,NOVOLIN N) 100 UNIT/ML injection Inject 15-20 Units into the skin 2 (two) times daily. Take 20 units SQ QAM and 15 units SQ QPM  1 vial  12  . isosorbide mononitrate (IMDUR) 30 MG 24 hr tablet Take 1 tablet (30 mg total) by mouth daily.  30 tablet  6  . metoprolol tartrate (LOPRESSOR) 25 MG tablet Take 0.5 tablets (12.5 mg total) by mouth 2 (two) times daily.  30 tablet  6  . nitroGLYCERIN (NITROSTAT) 0.4 MG SL tablet Place 1 tablet (0.4 mg total) under the tongue as directed. Chest Pain  25 tablet  3  . omeprazole (PRILOSEC) 20 MG capsule Take 20 mg by mouth every morning.       Marland Kitchen  tamsulosin (FLOMAX) 0.4 MG CAPS capsule Take 0.4 mg by mouth daily.      . Ticagrelor (BRILINTA) 90 MG TABS tablet Take 1 tablet (90 mg total) by mouth 2 (two) times daily.  60 tablet  6   No current facility-administered medications for this visit.     Past Surgical History  Procedure Laterality Date  . Cholecystectomy    . Esophagogastroduodenoscopy  06/08/2002    Soft stricture of the GE  junction with erosive esophagitis. The stricture was dilated to 17 [place] using a Maloney dilator. Swollen fold at GE junction on the gastric site which was biopsied for histology and was suspicious for sentinel folds.  . Colonoscopy  06/08/2002    Small polyp was oblated via cold biopsy from the cecum and two were snared, one was at the rectosigmoid junction measuring about a centimeter, another one at the rectum which was smaller.  . Transurethral resection of bladder tumor      x7  . Coronary angioplasty  1997    3  stents  . Transurethral resection of bladder tumor  03/15/2012    Procedure: TRANSURETHRAL RESECTION OF BLADDER TUMOR (TURBT);  Surgeon: Marissa Nestle, MD;  Location: [facility];  Service: Urology;  Laterality: N/A;  . Cataract extraction w/phaco  06/20/2012    Procedure: CATARACT EXTRACTION PHACO AND INTRAOCULAR LENS PLACEMENT (IOC);  Surgeon: Tonny Othar Curto, MD;  Location: [facility];  Service: Ophthalmology;  Laterality: Right;  CDE=16.59  . Cataract extraction w/phaco Left 07/04/2012    Procedure: CATARACT EXTRACTION PHACO AND INTRAOCULAR LENS PLACEMENT (IOC);  Surgeon: Tonny Clydell Sposito, MD;  Location: [facility];  Service: Ophthalmology;  Laterality: Left;  CDE: 24.33  . Transurethral resection of bladder tumor N/A 09/29/2012    Procedure: TRANSURETHRAL RESECTION OF BLADDER TUMOR (TURBT);  Surgeon: Marissa Nestle, MD;  Location: [facility];  Service: Urology;  Laterality: N/A;  . Multiple tooth extractions    . Cystoscopy with biopsy N/A 04/27/2013    Procedure: CYSTOSCOPY WITH BLADDER BIOPSY & FULGURATION;  Surgeon: Marissa Nestle, MD;  Location: [facility];  Service: Urology;  Laterality: N/A;     Allergies  Allergen Reactions  . Aspirin     Due to swelling with celecoxib  . Celecoxib Swelling  . Hydrocodone-Acetaminophen Itching  . Morphine Itching  . Niacin Itching  . Piroxicam Itching      Family History  Problem Relation Age of Onset  . Stroke Mother 22  . Diabetes Brother 20     Social History Craig Castillo reports that he quit smoking about 32 years ago. His smoking use included Cigarettes. He has a 10 pack-year smoking history. He has never used smokeless tobacco. Craig Castillo reports that he does not drink alcohol.   Review of Systems CONSTITUTIONAL: No weight loss, fever, chills, weakness or fatigue.  HEENT: Eyes: No visual loss, blurred vision, double vision or yellow sclerae.No hearing loss, sneezing, congestion, runny nose or sore throat.  SKIN: No rash or itching.    CARDIOVASCULAR: per HPI RESPIRATORY: No shortness of breath, cough or sputum.  GASTROINTESTINAL: No anorexia, nausea, vomiting or diarrhea. No abdominal pain or blood.  GENITOURINARY: No burning on urination, no polyuria NEUROLOGICAL: No headache, dizziness, syncope, paralysis, ataxia, numbness or tingling in the extremities. No change in bowel or bladder control.  MUSCULOSKELETAL: No muscle, back pain, joint pain or stiffness.  LYMPHATICS: No enlarged nodes. No history of splenectomy.  PSYCHIATRIC: No history of depression or anxiety.  ENDOCRINOLOGIC: No reports of sweating,  cold or heat intolerance. No polyuria or polydipsia.  Marland Kitchen   Physical Examination p 79 bp 150/70 Wt 213 lbs BMI 32 Gen: resting comfortably, no acute distress HEENT: no scleral icterus, pupils equal round and reactive, no palptable cervical adenopathy,  CV: RRR, no m/r/g, no JVD Resp: Clear to auscultation bilaterally GI: abdomen is soft, non-tender, non-distended, normal bowel sounds, no hepatosplenomegaly MSK: extremities are warm, no edema.  Skin: warm, no rash Neuro:  no focal deficits Psych: appropriate affect   Diagnostic Studies  11/2012 Echo: LVEF 55-60%, mild LVH, no WMAs,   11/2012 Carotid US:  Summary: Right: mild to moderate mixed plaque origin and proximal ICA.Left: moderate mixed plaque origin ICA and moderate soft plaque proximal ICA. Bilateral: 0-39% ICA stenosis. Vertebral artery flow is antegrade.   05/22/13 Lexiscan MPI  Intermediate risk Lexiscan Cardiolite as outlined. There were no clearly diagnostic ST segment changes, no arrhythmias were noted. Moderate-sized inferolateral ischemic defect noted consistent with potential circumflex or RCA stenosis. LVEF 54% with normal LV volumes.  Cardiac Catheterization 1.16.2015  Procedural Findings:  Hemodynamics:  AO 143/74 mean 106 mm Hg  LV 144/24 mm Hg  Coronary angiography:  Coronary dominance: right  Left mainstem: Mild tapering  distal to 10-20%.  Left anterior descending (LAD): The proximal LAD is heavily calcified. There is diffuse 30-40% disease in the proximal vessel. There are multiple small diagonals without significant disease.  Left circumflex (LCx): The LCx gives off 2 OM branches that are moderate in size. The proximal LCx is heavily calcified with segmental 95% stenosis. The mid LCx and both OMs have mild disease diffusely to 30%.  Right coronary artery (RCA): The RCA appears to have stents from the proximal vessel to past the crux. There is diffuse calcification. There is diffuse 50% disease throughout the stented segment. There is a focal 90% stenosis at the crux that appears to be within the stent.  Left ventriculography: Not done.  Final Conclusions:  1. 2 vessel obstructive CAD. The culprit lesion appears to be the proximal LCx. The RCA has extensive stenting with moderate diffuse in stent disease and a focal area of severe stenosis at the crux.  Recommendations: Recommend hydration over the weekend with close monitoring of renal function. Given complexity of disease I would recommend staging PCI on Monday. Will load with Brilinta. I anticipate treating the LCx will require rotational atherectomy given the heavy calcification and I would recommend DES since he has complex lesion and CKD given high risk of restenosis. It may be best to treat the culprit vessel only then follow on medical therapy for the RCA disease.  _____________  Percutaneous Coronary Intervention 1.19.2015  Conclusions: Successful PCI of the LCx with a 3.0 x 15 mm Xience DES using adjunctive rotational atherectomy  Recommendations: ASA, Brilinta at least 12 months, aggressive med Rx for residual CAD.    Assessment and Plan  1. Chest pain/CAD  - recent NSTEMI, s/p DES to LCX Jan 2015. Medically managing RCA disease.  - we held his brillinta recently for urology procedure, he had been on 6 months, now back on - notes some mild bleeding in  nephrostomy tubes, I have asked him to watch for next few days and contact us. If needed we may have to hold his brillinta for additional time period.   2. HTN - elevated, will increase imdur  3. Hyperlipidemia  - unclear statin history, he is on zetia and gemfibrozil so I presume he had some form of adverse reaction to  statin previously. I do not have these records. I have requested the patient discuss with his PCP at there visit tomorrow. If no strong contraindication recommend high dose statin therapy.   4. Bladder obstruction - bilateral nephrostomy tubes followed by urology.        Arnoldo Lenis, M.D., F.A.C.C.

## 2013-12-12 NOTE — Patient Instructions (Addendum)
   Increase Imdur to 60mg  daily - new sent to pharm (may take 2 of your 30mg  tabs daily till finish current supply) Continue all other medications.   Your physician wants you to follow up in:  3 months.  You will receive a reminder letter in the mail one-two months in advance.  If you don't receive a letter, please call our office to schedule the follow up appointment

## 2013-12-27 ENCOUNTER — Ambulatory Visit: Payer: PRIVATE HEALTH INSURANCE | Admitting: Cardiology

## 2014-01-19 ENCOUNTER — Other Ambulatory Visit: Payer: Self-pay

## 2014-01-19 MED ORDER — TICAGRELOR 90 MG PO TABS: 90.0000 mg | ORAL_TABLET | Freq: Two times a day (BID) | ORAL | Status: DC

## 2014-02-08 ENCOUNTER — Emergency Department (HOSPITAL_COMMUNITY)
Admission: EM | Admit: 2014-02-08 | Discharge: 2014-02-09 | Disposition: A | Discharge status: Other Acute Inpatient Hospital | Payer: PRIVATE HEALTH INSURANCE | Attending: Emergency Medicine | Admitting: Emergency Medicine

## 2014-02-08 ENCOUNTER — Encounter (HOSPITAL_COMMUNITY): Payer: Self-pay | Admitting: Emergency Medicine

## 2014-02-08 DIAGNOSIS — Z9861 Coronary angioplasty status: Secondary | ICD-10-CM | POA: Diagnosis not present

## 2014-02-08 DIAGNOSIS — E119 Type 2 diabetes mellitus without complications: Secondary | ICD-10-CM | POA: Insufficient documentation

## 2014-02-08 DIAGNOSIS — Z7982 Long term (current) use of aspirin: Secondary | ICD-10-CM | POA: Insufficient documentation

## 2014-02-08 DIAGNOSIS — T83022A Displacement of nephrostomy catheter, initial encounter: Secondary | ICD-10-CM

## 2014-02-08 DIAGNOSIS — Y833 Surgical operation with formation of external stoma as the cause of abnormal reaction of the patient, or of later complication, without mention of misadventure at the time of the procedure: Secondary | ICD-10-CM | POA: Diagnosis not present

## 2014-02-08 DIAGNOSIS — D649 Anemia, unspecified: Secondary | ICD-10-CM | POA: Diagnosis not present

## 2014-02-08 DIAGNOSIS — Z8551 Personal history of malignant neoplasm of bladder: Secondary | ICD-10-CM | POA: Insufficient documentation

## 2014-02-08 DIAGNOSIS — E785 Hyperlipidemia, unspecified: Secondary | ICD-10-CM | POA: Insufficient documentation

## 2014-02-08 DIAGNOSIS — Z7902 Long term (current) use of antithrombotics/antiplatelets: Secondary | ICD-10-CM | POA: Diagnosis not present

## 2014-02-08 DIAGNOSIS — I129 Hypertensive chronic kidney disease with stage 1 through stage 4 chronic kidney disease, or unspecified chronic kidney disease: Secondary | ICD-10-CM | POA: Diagnosis not present

## 2014-02-08 DIAGNOSIS — N184 Chronic kidney disease, stage 4 (severe): Secondary | ICD-10-CM | POA: Insufficient documentation

## 2014-02-08 DIAGNOSIS — N400 Enlarged prostate without lower urinary tract symptoms: Secondary | ICD-10-CM | POA: Diagnosis not present

## 2014-02-08 DIAGNOSIS — Z8673 Personal history of transient ischemic attack (TIA), and cerebral infarction without residual deficits: Secondary | ICD-10-CM | POA: Diagnosis not present

## 2014-02-08 DIAGNOSIS — M1990 Unspecified osteoarthritis, unspecified site: Secondary | ICD-10-CM | POA: Insufficient documentation

## 2014-02-08 DIAGNOSIS — T8389XA Other specified complication of genitourinary prosthetic devices, implants and grafts, initial encounter: Secondary | ICD-10-CM | POA: Insufficient documentation

## 2014-02-08 DIAGNOSIS — Z87891 Personal history of nicotine dependence: Secondary | ICD-10-CM | POA: Insufficient documentation

## 2014-02-08 DIAGNOSIS — Z794 Long term (current) use of insulin: Secondary | ICD-10-CM | POA: Diagnosis not present

## 2014-02-08 DIAGNOSIS — Z79899 Other long term (current) drug therapy: Secondary | ICD-10-CM | POA: Insufficient documentation

## 2014-02-08 DIAGNOSIS — I2510 Atherosclerotic heart disease of native coronary artery without angina pectoris: Secondary | ICD-10-CM | POA: Diagnosis not present

## 2014-02-08 LAB — BASIC METABOLIC PANEL
ANION GAP: 19 — AB (ref 5–15)
BUN: 73 mg/dL — ABNORMAL HIGH (ref 6–23)
CHLORIDE: 96 meq/L (ref 96–112)
CO2: 24 meq/L (ref 19–32)
CREATININE: 4.77 mg/dL — AB (ref 0.50–1.35)
Calcium: 8.8 mg/dL (ref 8.4–10.5)
GFR calc Af Amer: 12 mL/min — ABNORMAL LOW (ref >90)
GFR calc non Af Amer: 11 mL/min — ABNORMAL LOW (ref >90)
GLUCOSE: 178 mg/dL — AB (ref 70–99)
POTASSIUM: 3.1 meq/L — AB (ref 3.7–5.3)
Sodium: 139 mEq/L (ref 137–147)

## 2014-02-08 LAB — CBC WITH DIFFERENTIAL/PLATELET
Basophils Absolute: 0 10*3/uL (ref 0.0–0.1)
Basophils Relative: 0 % (ref 0–1)
EOS PCT: 2 % (ref 0–5)
Eosinophils Absolute: 0.2 10*3/uL (ref 0.0–0.7)
HEMATOCRIT: 21.4 % — AB (ref 39.0–52.0)
Hemoglobin: 6.9 g/dL — CL (ref 13.0–17.0)
LYMPHS ABS: 0.8 10*3/uL (ref 0.7–4.0)
Lymphocytes Relative: 8 % — ABNORMAL LOW (ref 12–46)
MCH: 28 pg (ref 26.0–34.0)
MCHC: 32.2 g/dL (ref 30.0–36.0)
MCV: 87 fL (ref 78.0–100.0)
MONOS PCT: 7 % (ref 3–12)
Monocytes Absolute: 0.7 10*3/uL (ref 0.1–1.0)
NEUTROS ABS: 7.8 10*3/uL — AB (ref 1.7–7.7)
Neutrophils Relative %: 83 % — ABNORMAL HIGH (ref 43–77)
Platelets: 399 10*3/uL (ref 150–400)
RBC: 2.46 MIL/uL — AB (ref 4.22–5.81)
RDW: 14.9 % (ref 11.5–15.5)
WBC: 9.5 10*3/uL (ref 4.0–10.5)

## 2014-02-08 NOTE — ED Notes (Signed)
Drainage tube on the right flank has been pulled out. No urine noted in drainage bag on that side.

## 2014-02-08 NOTE — ED Provider Notes (Signed)
CSN: [id number]     Arrival date & time 02/08/14  2133 History  This chart was scribed for Tanna Furry, MD by Edison Simon, ED Scribe. This patient was seen in room APA14/APA14 and the patient's care was started at 9:59 PM.    Chief Complaint  Patient presents with  . Post-op Problem   The history is provided by the patient. No language interpreter was used.   HPI Comments: Craig Castillo is a 80 y.o. male who presents to the Emergency Department complaining of a nephrostomy tube on his right side dislocating, noticed at 9PM today. He states his wife checks it every day. He states they were put in at [facility] because his bladder has been removed after extensive operations. He states he is no longer having urine collecting in the bag on his left side and has not for a few days but states he has been having urine collecting in his left bag. Urine in the left nephrostomy tube has been bloody for over a month. Per wife, they will have difficulty arranging transportation to [facility] in less than 5 days.    Past Medical History  Diagnosis Date  . Dyslipidemia   . DJD (degenerative joint disease)   . Hypertension   . Diabetes mellitus   . BPH (benign prostatic hyperplasia)   . CKD (chronic kidney disease) stage 3, GFR 30-59 ml/min   . Coronary artery disease     a. 1997 s/p MI and prior RCA stenting;  b. 04/2013 MV: inflat ischemia;  c. 05/2013 Cath/PCI: LM 10-20, LAD 30-40p, LCX 95p (3.0x15 Xience DES), 56m, OM1/2 30, RCA 50p diff ISR, 90d (med Rx).  . Bladder cancer   . Stroke    Past Surgical History  Procedure Laterality Date  . Cholecystectomy    . Esophagogastroduodenoscopy  06/08/2002    Soft stricture of the GE  junction with erosive esophagitis. The stricture was dilated to 4 [place] using a Maloney dilator. Swollen fold at GE junction on the gastric site which was biopsied for histology and was suspicious for sentinel folds.  . Colonoscopy  06/08/2002    Small polyp was oblated via  cold biopsy from the cecum and two were snared, one was at the rectosigmoid junction measuring about a centimeter, another one at the rectum which was smaller.  . Transurethral resection of bladder tumor      x7  . Coronary angioplasty  1997    3 stents  . Transurethral resection of bladder tumor  03/15/2012    Procedure: TRANSURETHRAL RESECTION OF BLADDER TUMOR (TURBT);  Surgeon: Marissa Nestle, MD;  Location: [facility];  Service: Urology;  Laterality: N/A;  . Cataract extraction w/phaco  06/20/2012    Procedure: CATARACT EXTRACTION PHACO AND INTRAOCULAR LENS PLACEMENT (IOC);  Surgeon: Tonny Branch, MD;  Location: [facility];  Service: Ophthalmology;  Laterality: Right;  CDE=16.59  . Cataract extraction w/phaco Left 07/04/2012    Procedure: CATARACT EXTRACTION PHACO AND INTRAOCULAR LENS PLACEMENT (IOC);  Surgeon: Tonny Branch, MD;  Location: [facility];  Service: Ophthalmology;  Laterality: Left;  CDE: 24.33  . Transurethral resection of bladder tumor N/A 09/29/2012    Procedure: TRANSURETHRAL RESECTION OF BLADDER TUMOR (TURBT);  Surgeon: Marissa Nestle, MD;  Location: [facility];  Service: Urology;  Laterality: N/A;  . Multiple tooth extractions    . Cystoscopy with biopsy N/A 04/27/2013    Procedure: CYSTOSCOPY WITH BLADDER BIOPSY & FULGURATION;  Surgeon: Marissa Nestle, MD;  Location: [facility];  Service: Urology;  Laterality: N/A;   Family History  Problem Relation Age of Onset  . Stroke Mother 72  . Diabetes Brother 59   History  Substance Use Topics  . Smoking status: Former Smoker -- 0.50 packs/day for 20 years    Types: Cigarettes    Quit date: 03/08/1981  . Smokeless tobacco: Never Used  . Alcohol Use: No    Review of Systems  Constitutional: Negative for fever, chills, diaphoresis, appetite change and fatigue.  HENT: Negative for mouth sores, sore throat and trouble swallowing.   Eyes: Negative for visual disturbance.  Respiratory: Negative for cough, chest tightness, shortness of breath  and wheezing.   Cardiovascular: Negative for chest pain.  Gastrointestinal: Negative for nausea, vomiting, abdominal pain, diarrhea and abdominal distention.  Endocrine: Negative for polydipsia, polyphagia and polyuria.  Genitourinary: Negative for dysuria, frequency and hematuria.  Musculoskeletal: Negative for gait problem.  Skin: Negative for color change, pallor and rash.  Neurological: Negative for dizziness, syncope, light-headedness and headaches.  Hematological: Does not bruise/bleed easily.  Psychiatric/Behavioral: Negative for behavioral problems and confusion.      Allergies  Aspirin; Celecoxib; Hydrocodone-acetaminophen; Morphine; Niacin; and Piroxicam  Home Medications   Prior to Admission medications   Medication Sig Start Date End Date Taking? Authorizing Provider  amLODipine (NORVASC) 10 MG tablet Take 10 mg by mouth daily.   Yes Historical Provider, MD  aspirin EC 81 MG tablet Take 81 mg by mouth daily.   Yes Historical Provider, MD  atorvastatin (LIPITOR) 20 MG tablet Take 1 tablet by mouth daily. 01/17/14  Yes Historical Provider, MD  calcitRIOL (ROCALTROL) 0.25 MCG capsule Take 1 capsule (0.25 mcg total) by mouth daily. 06/14/13  Yes Coral Spikes, DO  clopidogrel (PLAVIX) 75 MG tablet Take 1 tablet by mouth daily. 01/17/14  Yes Historical Provider, MD  ezetimibe (ZETIA) 10 MG tablet Take 10 mg by mouth every morning.    Yes Historical Provider, MD  furosemide (LASIX) 40 MG tablet Take 1 tablet by mouth daily. 01/17/14  Yes Historical Provider, MD  gemfibrozil (LOPID) 600 MG tablet Take 600 mg by mouth 2 (two) times daily.    Yes Historical Provider, MD  hydrochlorothiazide (HYDRODIURIL) 25 MG tablet Take 1 tablet by mouth daily. 01/23/14  Yes Historical Provider, MD  insulin NPH (HUMULIN N,NOVOLIN N) 100 UNIT/ML injection Inject 15-20 Units into the skin 2 (two) times daily. Take 20 units SQ QAM and 15 units SQ QPM 04/28/13  Yes Kathie Dike, MD  isosorbide mononitrate  (IMDUR) 60 MG 24 hr tablet Take 1 tablet (60 mg total) by mouth daily. 12/12/13  Yes Arnoldo Lenis, MD  metoprolol tartrate (LOPRESSOR) 25 MG tablet Take 0.5 tablets (12.5 mg total) by mouth 2 (two) times daily. 09/26/13  Yes Arnoldo Lenis, MD  nitroGLYCERIN (NITROSTAT) 0.4 MG SL tablet Place 1 tablet (0.4 mg total) under the tongue as directed. Chest Pain 10/18/13  Yes Arnoldo Lenis, MD  omeprazole (PRILOSEC) 20 MG capsule Take 20 mg by mouth every morning.    Yes Historical Provider, MD  tamsulosin (FLOMAX) 0.4 MG CAPS capsule Take 0.4 mg by mouth daily.   Yes Historical Provider, MD  ticagrelor (BRILINTA) 90 MG TABS tablet Take 1 tablet (90 mg total) by mouth 2 (two) times daily. 01/19/14  Yes Arnoldo Lenis, MD   BP 136/93  Pulse 82  Temp(Src) 99 F (37.2 C) (Oral)  Resp 20  Ht 5\' 8"  (1.727 m)  Wt 215 lb (97.523  kg)  BMI 32.70 kg/m2  SpO2 99% Physical Exam  Nursing note and vitals reviewed. Constitutional: He is oriented to person, place, and time. He appears well-developed and well-nourished. No distress.  HENT:  Head: Normocephalic.  Eyes: Conjunctivae are normal. Pupils are equal, round, and reactive to light. No scleral icterus.  Neck: Normal range of motion. Neck supple. No thyromegaly present.  Cardiovascular: Normal rate and regular rhythm.  Exam reveals no gallop and no friction rub.   No murmur heard. Pulmonary/Chest: Effort normal and breath sounds normal. No respiratory distress. He has no wheezes. He has no rales.  Abdominal: Soft. Bowel sounds are normal. He exhibits no distension. There is no tenderness. There is no rebound.  Musculoskeletal: Normal range of motion.  Right nephrostomy tube pigtail completely removed from insertion site, Left nephrostomy tube appears in normal position  Neurological: He is alert and oriented to person, place, and time.  Skin: Skin is warm and dry. No rash noted.  Psychiatric: He has a normal mood and affect. His behavior is  normal.    ED Course  Procedures (including critical care time) Labs Review Labs Reviewed  CBC WITH DIFFERENTIAL - Abnormal; Notable for the following:    RBC 2.46 (*)    Hemoglobin 6.9 (*)    HCT 21.4 (*)    Neutrophils Relative % 83 (*)    Lymphocytes Relative 8 (*)    Neutro Abs 7.8 (*)    All other components within normal limits  BASIC METABOLIC PANEL - Abnormal; Notable for the following:    Potassium 3.1 (*)    Glucose, Bld 178 (*)    BUN 73 (*)    Creatinine, Ser 4.77 (*)    GFR calc non Af Amer 11 (*)    GFR calc Af Amer 12 (*)    Anion gap 19 (*)    All other components within normal limits    Imaging Review No results found.   EKG Interpretation None     DIAGNOSTIC STUDIES: Oxygen Saturation is 99% on room air, normal by my interpretation.    COORDINATION OF CARE: 10:08 PM-Discussed with patient at beside the treatment plan which includes blood tests to check on kidney function and contacting a radiologist to put the tube back in place. The patient reports he would have trouble arranging transportation to [place], so I will explore our options. The patient agrees with the plan and has no further questions at this time.     MDM   Final diagnoses:  Displacement of nephrostomy tube  Chronic renal failure, stage 4 (severe)  Anemia, unspecified anemia type   Patient's baseline creatinine is around 3. Increased at 4.77. Baseline hemoglobin of 9. Hemoglobin tonight 6.9. Blood in his nephrostomy. No blood on rectal exam. I discussed the case with Dr.Peyton, on call for the pts Urologist, Dr. Lauris Chroman Hemal.  Actually a green was made to send the patient to the emergency room at [facility] for internal medicine consultation regarding his diabetes, anemia, and other medical problems. Dr. Ria Clock will see the patient in consultation and facilitate percutaneous nephrostomy tube placement interventional radiology.  I personally performed the services described in this  documentation, which was scribed in my presence. The recorded information has been reviewed and is accurate.    Tanna Furry, MD 02/08/14 2354  Tanna Furry, MD 02/08/14 [phone]

## 2014-02-08 NOTE — ED Notes (Signed)
CRITICAL VALUE ALERT  Critical value received:  hgb  Date of notification:  02/08/14  Time of notification:  2245  Critical value read back:Yes.    Nurse who received alert:  Holli Humbles  MD notified (1st page):  Dr. Jeneen Rinks  Time of first page:  2248  MD notified (2nd page):  Time of second page:  Responding MD:  Dr.James  Time MD responded:  2248

## 2014-02-08 NOTE — ED Notes (Signed)
Pt states his right drainage tube came out tonight. Tubes placed in 3 months ago in [place] [facility]. Pt states the tube has come out in the past & was put back in the ED.

## 2014-02-08 NOTE — ED Provider Notes (Signed)
CSN: [id number]     Arrival date & time 02/08/14  2133 History  This chart was scribed for Tanna Furry, MD by Edison Simon, ED Scribe. This patient was seen in room APA14/APA14 and the patient's care was started at 9:59 PM.    Chief Complaint  Patient presents with  . Post-op Problem   The history is provided by the patient. No language interpreter was used.   HPI Comments: Craig Castillo is a 80 y.o. male who presents to the Emergency Department complaining of a nephrostomy tube on his right side dislocating, noticed at 9PM today. He states his wife checks it every day. He states they were put in at [facility] because his bladder has been removed after extensive operations. He states he is no longer having urine collecting in the bag on his left side and has not for a few days but states he has been having urine collecting in his left bag. Urine in the left nephrostomy tube has been bloody for over a month. Per wife, they will have difficulty arranging transportation to [facility] in less than 5 days.    Past Medical History  Diagnosis Date  . Dyslipidemia   . DJD (degenerative joint disease)   . Hypertension   . Diabetes mellitus   . BPH (benign prostatic hyperplasia)   . CKD (chronic kidney disease) stage 3, GFR 30-59 ml/min   . Coronary artery disease     a. 1997 s/p MI and prior RCA stenting;  b. 04/2013 MV: inflat ischemia;  c. 05/2013 Cath/PCI: LM 10-20, LAD 30-40p, LCX 95p (3.0x15 Xience DES), 64m, OM1/2 30, RCA 50p diff ISR, 90d (med Rx).  . Bladder cancer   . Stroke    Past Surgical History  Procedure Laterality Date  . Cholecystectomy    . Esophagogastroduodenoscopy  06/08/2002    Soft stricture of the GE  junction with erosive esophagitis. The stricture was dilated to 13 [place] using a Maloney dilator. Swollen fold at GE junction on the gastric site which was biopsied for histology and was suspicious for sentinel folds.  . Colonoscopy  06/08/2002    Small polyp was oblated via  cold biopsy from the cecum and two were snared, one was at the rectosigmoid junction measuring about a centimeter, another one at the rectum which was smaller.  . Transurethral resection of bladder tumor      x7  . Coronary angioplasty  1997    3 stents  . Transurethral resection of bladder tumor  03/15/2012    Procedure: TRANSURETHRAL RESECTION OF BLADDER TUMOR (TURBT);  Surgeon: Marissa Nestle, MD;  Location: [facility];  Service: Urology;  Laterality: N/A;  . Cataract extraction w/phaco  06/20/2012    Procedure: CATARACT EXTRACTION PHACO AND INTRAOCULAR LENS PLACEMENT (IOC);  Surgeon: Tonny Branch, MD;  Location: [facility];  Service: Ophthalmology;  Laterality: Right;  CDE=16.59  . Cataract extraction w/phaco Left 07/04/2012    Procedure: CATARACT EXTRACTION PHACO AND INTRAOCULAR LENS PLACEMENT (IOC);  Surgeon: Tonny Branch, MD;  Location: [facility];  Service: Ophthalmology;  Laterality: Left;  CDE: 24.33  . Transurethral resection of bladder tumor N/A 09/29/2012    Procedure: TRANSURETHRAL RESECTION OF BLADDER TUMOR (TURBT);  Surgeon: Marissa Nestle, MD;  Location: [facility];  Service: Urology;  Laterality: N/A;  . Multiple tooth extractions    . Cystoscopy with biopsy N/A 04/27/2013    Procedure: CYSTOSCOPY WITH BLADDER BIOPSY & FULGURATION;  Surgeon: Marissa Nestle, MD;  Location: [facility];  Service: Urology;  Laterality: N/A;   Family History  Problem Relation Age of Onset  . Stroke Mother 23  . Diabetes Brother 45   History  Substance Use Topics  . Smoking status: Former Smoker -- 0.50 packs/day for 20 years    Types: Cigarettes    Quit date: 03/08/1981  . Smokeless tobacco: Never Used  . Alcohol Use: No    Review of Systems  Constitutional: Negative for fever, chills, diaphoresis, appetite change and fatigue.  HENT: Negative for mouth sores, sore throat and trouble swallowing.   Eyes: Negative for visual disturbance.  Respiratory: Negative for cough, chest tightness, shortness of breath  and wheezing.   Cardiovascular: Negative for chest pain.  Gastrointestinal: Negative for nausea, vomiting, abdominal pain, diarrhea and abdominal distention.  Endocrine: Negative for polydipsia, polyphagia and polyuria.  Genitourinary: Negative for dysuria, frequency and hematuria.  Musculoskeletal: Negative for gait problem.  Skin: Negative for color change, pallor and rash.  Neurological: Negative for dizziness, syncope, light-headedness and headaches.  Hematological: Does not bruise/bleed easily.  Psychiatric/Behavioral: Negative for behavioral problems and confusion.      Allergies  Aspirin; Celecoxib; Hydrocodone-acetaminophen; Morphine; Niacin; and Piroxicam  Home Medications   Prior to Admission medications   Medication Sig Start Date End Date Taking? Authorizing Provider  amLODipine (NORVASC) 10 MG tablet Take 10 mg by mouth daily.   Yes Historical Provider, MD  aspirin EC 81 MG tablet Take 81 mg by mouth daily.   Yes Historical Provider, MD  atorvastatin (LIPITOR) 20 MG tablet Take 1 tablet by mouth daily. 01/17/14  Yes Historical Provider, MD  calcitRIOL (ROCALTROL) 0.25 MCG capsule Take 1 capsule (0.25 mcg total) by mouth daily. 06/14/13  Yes Coral Spikes, DO  clopidogrel (PLAVIX) 75 MG tablet Take 1 tablet by mouth daily. 01/17/14  Yes Historical Provider, MD  ezetimibe (ZETIA) 10 MG tablet Take 10 mg by mouth every morning.    Yes Historical Provider, MD  furosemide (LASIX) 40 MG tablet Take 1 tablet by mouth daily. 01/17/14  Yes Historical Provider, MD  gemfibrozil (LOPID) 600 MG tablet Take 600 mg by mouth 2 (two) times daily.    Yes Historical Provider, MD  hydrochlorothiazide (HYDRODIURIL) 25 MG tablet Take 1 tablet by mouth daily. 01/23/14  Yes Historical Provider, MD  insulin NPH (HUMULIN N,NOVOLIN N) 100 UNIT/ML injection Inject 15-20 Units into the skin 2 (two) times daily. Take 20 units SQ QAM and 15 units SQ QPM 04/28/13  Yes Kathie Dike, MD  isosorbide mononitrate  (IMDUR) 60 MG 24 hr tablet Take 1 tablet (60 mg total) by mouth daily. 12/12/13  Yes Arnoldo Lenis, MD  metoprolol tartrate (LOPRESSOR) 25 MG tablet Take 0.5 tablets (12.5 mg total) by mouth 2 (two) times daily. 09/26/13  Yes Arnoldo Lenis, MD  nitroGLYCERIN (NITROSTAT) 0.4 MG SL tablet Place 1 tablet (0.4 mg total) under the tongue as directed. Chest Pain 10/18/13  Yes Arnoldo Lenis, MD  omeprazole (PRILOSEC) 20 MG capsule Take 20 mg by mouth every morning.    Yes Historical Provider, MD  tamsulosin (FLOMAX) 0.4 MG CAPS capsule Take 0.4 mg by mouth daily.   Yes Historical Provider, MD  ticagrelor (BRILINTA) 90 MG TABS tablet Take 1 tablet (90 mg total) by mouth 2 (two) times daily. 01/19/14  Yes Arnoldo Lenis, MD   BP 136/93  Pulse 82  Temp(Src) 99 F (37.2 C) (Oral)  Resp 20  Ht 5\' 8"  (1.727 m)  Wt 215 lb (97.523  kg)  BMI 32.70 kg/m2  SpO2 99% Physical Exam  Nursing note and vitals reviewed. Constitutional: He is oriented to person, place, and time. He appears well-developed and well-nourished. No distress.  HENT:  Head: Normocephalic.  Eyes: Conjunctivae are normal. Pupils are equal, round, and reactive to light. No scleral icterus.  Neck: Normal range of motion. Neck supple. No thyromegaly present.  Cardiovascular: Normal rate and regular rhythm.  Exam reveals no gallop and no friction rub.   No murmur heard. Pulmonary/Chest: Effort normal and breath sounds normal. No respiratory distress. He has no wheezes. He has no rales.  Abdominal: Soft. Bowel sounds are normal. He exhibits no distension. There is no tenderness. There is no rebound.  Musculoskeletal: Normal range of motion.  Right nephrostomy tube pigtail completely removed from insertion site, Left nephrostomy tube appears in normal position  Neurological: He is alert and oriented to person, place, and time.  Skin: Skin is warm and dry. No rash noted.  Psychiatric: He has a normal mood and affect. His behavior is  normal.    ED Course  Procedures (including critical care time) Labs Review Labs Reviewed  CBC WITH DIFFERENTIAL - Abnormal; Notable for the following:    RBC 2.46 (*)    Hemoglobin 6.9 (*)    HCT 21.4 (*)    Neutrophils Relative % 83 (*)    Lymphocytes Relative 8 (*)    Neutro Abs 7.8 (*)    All other components within normal limits  BASIC METABOLIC PANEL - Abnormal; Notable for the following:    Potassium 3.1 (*)    Glucose, Bld 178 (*)    BUN 73 (*)    Creatinine, Ser 4.77 (*)    GFR calc non Af Amer 11 (*)    GFR calc Af Amer 12 (*)    Anion gap 19 (*)    All other components within normal limits    Imaging Review No results found.   EKG Interpretation None     DIAGNOSTIC STUDIES: Oxygen Saturation is 99% on room air, normal by my interpretation.    COORDINATION OF CARE: 10:08 PM-Discussed with patient at beside the treatment plan which includes blood tests to check on kidney function and contacting a radiologist to put the tube back in place. The patient reports he would have trouble arranging transportation to [place], so I will explore our options. The patient agrees with the plan and has no further questions at this time.     MDM   Final diagnoses:  Displacement of nephrostomy tube  Chronic renal failure, stage 4 (severe)  Anemia, unspecified anemia type   Patient's baseline creatinine is around 3. Increased at 4.77. Baseline hemoglobin of 9. Hemoglobin tonight 6.9. Blood in his nephrostomy. No blood on rectal exam. I discussed the case with Dr.Peyton, on call for the pts Urologist, Dr. Lauris Chroman Hemal.  Actually a green was made to send the patient to the emergency room at [facility] for internal medicine consultation regarding his diabetes, anemia, and other medical problems. Dr. Ria Clock will see the patient in consultation and facilitate percutaneous nephrostomy tube placement interventional radiology.  I personally performed the services described in this  documentation, which was scribed in my presence. The recorded information has been reviewed and is accurate.    Tanna Furry, MD 02/08/14 [phone]

## 2014-02-09 DIAGNOSIS — Z794 Long term (current) use of insulin: Secondary | ICD-10-CM | POA: Diagnosis not present

## 2014-02-09 DIAGNOSIS — E785 Hyperlipidemia, unspecified: Secondary | ICD-10-CM | POA: Diagnosis not present

## 2014-02-09 DIAGNOSIS — Z87891 Personal history of nicotine dependence: Secondary | ICD-10-CM | POA: Diagnosis not present

## 2014-02-09 DIAGNOSIS — T8389XA Other specified complication of genitourinary prosthetic devices, implants and grafts, initial encounter: Secondary | ICD-10-CM | POA: Diagnosis present

## 2014-02-09 DIAGNOSIS — Z7902 Long term (current) use of antithrombotics/antiplatelets: Secondary | ICD-10-CM | POA: Diagnosis not present

## 2014-02-09 DIAGNOSIS — Z9861 Coronary angioplasty status: Secondary | ICD-10-CM | POA: Diagnosis not present

## 2014-02-09 DIAGNOSIS — Z8673 Personal history of transient ischemic attack (TIA), and cerebral infarction without residual deficits: Secondary | ICD-10-CM | POA: Diagnosis not present

## 2014-02-09 DIAGNOSIS — I2510 Atherosclerotic heart disease of native coronary artery without angina pectoris: Secondary | ICD-10-CM | POA: Diagnosis not present

## 2014-02-09 DIAGNOSIS — Y833 Surgical operation with formation of external stoma as the cause of abnormal reaction of the patient, or of later complication, without mention of misadventure at the time of the procedure: Secondary | ICD-10-CM | POA: Diagnosis not present

## 2014-02-09 DIAGNOSIS — Z8551 Personal history of malignant neoplasm of bladder: Secondary | ICD-10-CM | POA: Diagnosis not present

## 2014-02-09 DIAGNOSIS — Z79899 Other long term (current) drug therapy: Secondary | ICD-10-CM | POA: Diagnosis not present

## 2014-02-09 DIAGNOSIS — M1990 Unspecified osteoarthritis, unspecified site: Secondary | ICD-10-CM | POA: Diagnosis not present

## 2014-02-09 DIAGNOSIS — Z7982 Long term (current) use of aspirin: Secondary | ICD-10-CM | POA: Diagnosis not present

## 2014-02-09 DIAGNOSIS — E119 Type 2 diabetes mellitus without complications: Secondary | ICD-10-CM | POA: Diagnosis not present

## 2014-02-09 DIAGNOSIS — N400 Enlarged prostate without lower urinary tract symptoms: Secondary | ICD-10-CM | POA: Diagnosis not present

## 2014-02-09 DIAGNOSIS — I129 Hypertensive chronic kidney disease with stage 1 through stage 4 chronic kidney disease, or unspecified chronic kidney disease: Secondary | ICD-10-CM | POA: Diagnosis not present

## 2014-02-09 DIAGNOSIS — D649 Anemia, unspecified: Secondary | ICD-10-CM | POA: Diagnosis not present

## 2014-02-09 DIAGNOSIS — N184 Chronic kidney disease, stage 4 (severe): Secondary | ICD-10-CM | POA: Diagnosis not present

## 2014-02-09 NOTE — ED Notes (Signed)
323ml drained from the left leg bag. No urine noted in the right bag.

## 2014-02-09 NOTE — ED Notes (Signed)
Report given to [facility]. Truck in route & pt aware of current timeline.

## 2014-02-09 NOTE — ED Notes (Signed)
Called [facility] to set up transport. Will be 1 hour to 1.5 made sure with Ron that it was ok she stated that it is not emergent so we can wait.

## 2014-02-19 ENCOUNTER — Other Ambulatory Visit: Payer: Self-pay | Admitting: *Deleted

## 2014-02-19 ENCOUNTER — Other Ambulatory Visit: Payer: Self-pay | Admitting: Family Medicine

## 2014-02-19 MED ORDER — NITROGLYCERIN 0.4 MG SL SUBL: 0.4000 mg | SUBLINGUAL_TABLET | SUBLINGUAL | Status: DC | PRN

## 2014-03-02 ENCOUNTER — Encounter: Payer: Self-pay | Admitting: Cardiology

## 2014-03-02 ENCOUNTER — Emergency Department (HOSPITAL_COMMUNITY): Payer: PRIVATE HEALTH INSURANCE

## 2014-03-02 ENCOUNTER — Encounter (HOSPITAL_COMMUNITY): Payer: Self-pay | Admitting: Emergency Medicine

## 2014-03-02 ENCOUNTER — Emergency Department (HOSPITAL_COMMUNITY)
Admission: EM | Admit: 2014-03-02 | Discharge: 2014-03-02 | Disposition: A | Discharge status: Other Acute Inpatient Hospital | Payer: PRIVATE HEALTH INSURANCE | Attending: Emergency Medicine | Admitting: Emergency Medicine

## 2014-03-02 ENCOUNTER — Ambulatory Visit (INDEPENDENT_AMBULATORY_CARE_PROVIDER_SITE_OTHER): Payer: PRIVATE HEALTH INSURANCE | Admitting: Cardiology

## 2014-03-02 VITALS — BP 159/78 | HR 84 | Ht 68.0 in | Wt 213.8 lb

## 2014-03-02 DIAGNOSIS — D649 Anemia, unspecified: Secondary | ICD-10-CM | POA: Diagnosis not present

## 2014-03-02 DIAGNOSIS — E785 Hyperlipidemia, unspecified: Secondary | ICD-10-CM

## 2014-03-02 DIAGNOSIS — M1990 Unspecified osteoarthritis, unspecified site: Secondary | ICD-10-CM | POA: Diagnosis not present

## 2014-03-02 DIAGNOSIS — Y9389 Activity, other specified: Secondary | ICD-10-CM | POA: Diagnosis not present

## 2014-03-02 DIAGNOSIS — Z936 Other artificial openings of urinary tract status: Secondary | ICD-10-CM | POA: Diagnosis not present

## 2014-03-02 DIAGNOSIS — I10 Essential (primary) hypertension: Secondary | ICD-10-CM

## 2014-03-02 DIAGNOSIS — Z8551 Personal history of malignant neoplasm of bladder: Secondary | ICD-10-CM | POA: Diagnosis not present

## 2014-03-02 DIAGNOSIS — Z8673 Personal history of transient ischemic attack (TIA), and cerebral infarction without residual deficits: Secondary | ICD-10-CM | POA: Diagnosis not present

## 2014-03-02 DIAGNOSIS — S065X0A Traumatic subdural hemorrhage without loss of consciousness, initial encounter: Secondary | ICD-10-CM | POA: Diagnosis not present

## 2014-03-02 DIAGNOSIS — N184 Chronic kidney disease, stage 4 (severe): Secondary | ICD-10-CM | POA: Insufficient documentation

## 2014-03-02 DIAGNOSIS — Z79899 Other long term (current) drug therapy: Secondary | ICD-10-CM | POA: Diagnosis not present

## 2014-03-02 DIAGNOSIS — Y9289 Other specified places as the place of occurrence of the external cause: Secondary | ICD-10-CM | POA: Diagnosis not present

## 2014-03-02 DIAGNOSIS — Z9849 Cataract extraction status, unspecified eye: Secondary | ICD-10-CM | POA: Insufficient documentation

## 2014-03-02 DIAGNOSIS — W1830XA Fall on same level, unspecified, initial encounter: Secondary | ICD-10-CM | POA: Insufficient documentation

## 2014-03-02 DIAGNOSIS — S4991XA Unspecified injury of right shoulder and upper arm, initial encounter: Secondary | ICD-10-CM | POA: Diagnosis not present

## 2014-03-02 DIAGNOSIS — I129 Hypertensive chronic kidney disease with stage 1 through stage 4 chronic kidney disease, or unspecified chronic kidney disease: Secondary | ICD-10-CM | POA: Diagnosis not present

## 2014-03-02 DIAGNOSIS — N400 Enlarged prostate without lower urinary tract symptoms: Secondary | ICD-10-CM | POA: Insufficient documentation

## 2014-03-02 DIAGNOSIS — S0990XA Unspecified injury of head, initial encounter: Secondary | ICD-10-CM | POA: Diagnosis present

## 2014-03-02 DIAGNOSIS — R55 Syncope and collapse: Secondary | ICD-10-CM | POA: Diagnosis not present

## 2014-03-02 DIAGNOSIS — E119 Type 2 diabetes mellitus without complications: Secondary | ICD-10-CM | POA: Diagnosis not present

## 2014-03-02 DIAGNOSIS — S199XXA Unspecified injury of neck, initial encounter: Secondary | ICD-10-CM | POA: Diagnosis not present

## 2014-03-02 DIAGNOSIS — S299XXA Unspecified injury of thorax, initial encounter: Secondary | ICD-10-CM | POA: Insufficient documentation

## 2014-03-02 DIAGNOSIS — S065X9A Traumatic subdural hemorrhage with loss of consciousness of unspecified duration, initial encounter: Secondary | ICD-10-CM

## 2014-03-02 DIAGNOSIS — Z7982 Long term (current) use of aspirin: Secondary | ICD-10-CM | POA: Insufficient documentation

## 2014-03-02 DIAGNOSIS — I2510 Atherosclerotic heart disease of native coronary artery without angina pectoris: Secondary | ICD-10-CM | POA: Insufficient documentation

## 2014-03-02 DIAGNOSIS — Z87891 Personal history of nicotine dependence: Secondary | ICD-10-CM | POA: Insufficient documentation

## 2014-03-02 DIAGNOSIS — S065XAA Traumatic subdural hemorrhage with loss of consciousness status unknown, initial encounter: Secondary | ICD-10-CM

## 2014-03-02 DIAGNOSIS — Z9861 Coronary angioplasty status: Secondary | ICD-10-CM | POA: Diagnosis not present

## 2014-03-02 LAB — COMPREHENSIVE METABOLIC PANEL
ALBUMIN: 3.3 g/dL — AB (ref 3.5–5.2)
ALT: 7 U/L (ref 0–53)
AST: 10 U/L (ref 0–37)
Alkaline Phosphatase: 71 U/L (ref 39–117)
Anion gap: 20 — ABNORMAL HIGH (ref 5–15)
BUN: 53 mg/dL — ABNORMAL HIGH (ref 6–23)
CALCIUM: 8.5 mg/dL (ref 8.4–10.5)
CHLORIDE: 101 meq/L (ref 96–112)
CO2: 23 meq/L (ref 19–32)
Creatinine, Ser: 4.17 mg/dL — ABNORMAL HIGH (ref 0.50–1.35)
GFR calc Af Amer: 14 mL/min — ABNORMAL LOW (ref >90)
GFR, EST NON AFRICAN AMERICAN: 12 mL/min — AB (ref >90)
Glucose, Bld: 130 mg/dL — ABNORMAL HIGH (ref 70–99)
Potassium: 3.3 mEq/L — ABNORMAL LOW (ref 3.7–5.3)
SODIUM: 144 meq/L (ref 137–147)
Total Bilirubin: 0.2 mg/dL — ABNORMAL LOW (ref 0.3–1.2)
Total Protein: 7.5 g/dL (ref 6.0–8.3)

## 2014-03-02 LAB — CBC WITH DIFFERENTIAL/PLATELET
BASOS ABS: 0 10*3/uL (ref 0.0–0.1)
Basophils Relative: 0 % (ref 0–1)
Eosinophils Absolute: 0.4 10*3/uL (ref 0.0–0.7)
Eosinophils Relative: 4 % (ref 0–5)
HCT: 25.5 % — ABNORMAL LOW (ref 39.0–52.0)
Hemoglobin: 8.2 g/dL — ABNORMAL LOW (ref 13.0–17.0)
Lymphocytes Relative: 10 % — ABNORMAL LOW (ref 12–46)
Lymphs Abs: 0.9 10*3/uL (ref 0.7–4.0)
MCH: 28.3 pg (ref 26.0–34.0)
MCHC: 32.2 g/dL (ref 30.0–36.0)
MCV: 87.9 fL (ref 78.0–100.0)
Monocytes Absolute: 0.6 10*3/uL (ref 0.1–1.0)
Monocytes Relative: 7 % (ref 3–12)
NEUTROS ABS: 7.1 10*3/uL (ref 1.7–7.7)
Neutrophils Relative %: 79 % — ABNORMAL HIGH (ref 43–77)
PLATELETS: 283 10*3/uL (ref 150–400)
RBC: 2.9 MIL/uL — ABNORMAL LOW (ref 4.22–5.81)
RDW: 15.6 % — AB (ref 11.5–15.5)
WBC: 9.1 10*3/uL (ref 4.0–10.5)

## 2014-03-02 LAB — TROPONIN I: Troponin I: <0.3 ng/mL (ref <0.30)

## 2014-03-02 MED ORDER — FENTANYL CITRATE 0.05 MG/ML IJ SOLN
50.0000 ug | Freq: Once | INTRAMUSCULAR | Status: AC
  Administered 2014-03-02: 50 ug via INTRAVENOUS
  Filled 2014-03-02: qty 2

## 2014-03-02 NOTE — ED Notes (Signed)
Cervical spine cleared by Dr. Sabra Heck, who removed C-collar.

## 2014-03-02 NOTE — ED Notes (Signed)
Have attempted IV x 3. Will get someone else to try.

## 2014-03-02 NOTE — Patient Instructions (Signed)
There were no changes to your medications. Continue as directed. Your physician wants you to follow up in:  4 months.  You will receive a reminder letter in the mail one-two months in advance.  If you don't receive a letter, please call our office to schedule the follow up appointment

## 2014-03-02 NOTE — ED Provider Notes (Signed)
CSN: [id number]     Arrival date & time 03/02/14  1653 History   First MD Initiated Contact with Patient 03/02/14 1655     Chief Complaint  Patient presents with  . 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 He presents with a complaint of syncope after he fell outside in a carport, his family member called for EMS transport. The patient has no recollection of these events, he denies having prodromal symptoms including no headache, no lightheadedness, no palpitations, no chest pain, no difficulty breathing. He states he was seen at his cardiologist office this morning for cardiology followup at which time he was not having any symptoms. He does endorse having a normal lunch prior to this happening.  At this time the patient complains of neck pain as well as upper shoulder pain especially on the right. He denies headache, change in vision, chest pain, abdominal pain, numbness or weakness. The patient was immobilized in a cervical collar prior to transport. Cardiac monitoring by paramedic showed no signs of arrhythmia or significant abnormal vital signs.  Patient is a 80 y.o. male presenting with fall. The history is provided by the patient, the EMS personnel and medical records.  Fall    Past Medical History  Diagnosis Date  . Dyslipidemia   . DJD (degenerative joint disease)   . Hypertension   . Diabetes mellitus   . BPH (benign prostatic hyperplasia)   . CKD (chronic kidney disease) stage 3, GFR 30-59 ml/min   . Coronary artery disease     a. 1997 s/p MI and prior RCA stenting;  b. 04/2013 MV: inflat ischemia;  c. 05/2013 Cath/PCI: LM 10-20, LAD 30-40p,  LCX 95p (3.0x15 Xience DES), 75m, OM1/2 30, RCA 50p diff ISR, 90d (med Rx).  . Bladder cancer   . Stroke    Past Surgical History  Procedure Laterality Date  . Cholecystectomy    . Esophagogastroduodenoscopy  06/08/2002    Soft stricture of the GE  junction with erosive esophagitis. The stricture was dilated to 41 [place] using a Maloney dilator. Swollen fold at GE junction on the gastric site which was biopsied for histology and was suspicious for sentinel folds.  . Colonoscopy  06/08/2002    Small polyp was oblated via cold biopsy from the cecum and two were snared, one was at the rectosigmoid junction measuring about a centimeter, another one at the rectum which was smaller.  . Transurethral resection of bladder tumor      x7  . Coronary angioplasty  1997    3 stents  . Transurethral resection of bladder tumor  03/15/2012    Procedure: TRANSURETHRAL RESECTION OF BLADDER TUMOR (TURBT);  Surgeon: Marissa Nestle, MD;  Location: [facility];  Service: Urology;  Laterality: N/A;  . Cataract extraction w/phaco  06/20/2012    Procedure: CATARACT EXTRACTION PHACO AND INTRAOCULAR LENS PLACEMENT (IOC);  Surgeon: Tonny Branch, MD;  Location: [facility];  Service: Ophthalmology;  Laterality: Right;  CDE=16.59  . Cataract extraction w/phaco Left 07/04/2012    Procedure: CATARACT EXTRACTION PHACO AND INTRAOCULAR LENS PLACEMENT (IOC);  Surgeon: Tonny Branch, MD;  Location: [facility];  Service: Ophthalmology;  Laterality: Left;  CDE: 24.33  . Transurethral resection of bladder tumor N/A 09/29/2012  Procedure: TRANSURETHRAL RESECTION OF BLADDER TUMOR (TURBT);  Surgeon: Marissa Nestle, MD;  Location: [facility];  Service: Urology;  Laterality: N/A;  . Multiple tooth extractions    . Cystoscopy with biopsy N/A 04/27/2013    Procedure: CYSTOSCOPY WITH BLADDER BIOPSY & FULGURATION;  Surgeon: Marissa Nestle, MD;  Location: [facility];  Service: Urology;  Laterality: N/A;   Family History  Problem Relation Age of Onset  .  Stroke Mother 48  . Diabetes Brother 47   History  Substance Use Topics  . Smoking status: Former Smoker -- 0.50 packs/day for 20 years    Types: Cigarettes    Quit date: 03/08/1981  . Smokeless tobacco: Never Used  . Alcohol Use: No    Review of Systems  All other systems reviewed and are negative.     Allergies  Aspirin; Celecoxib; Hydrocodone-acetaminophen; Morphine; Niacin; and Piroxicam  Home Medications   Prior to Admission medications   Medication Sig Start Date End Date Taking? Authorizing Provider  amLODipine (NORVASC) 10 MG tablet Take 10 mg by mouth daily.    Historical Provider, MD  aspirin EC 81 MG tablet Take 81 mg by mouth daily.    Historical Provider, MD  calcitRIOL (ROCALTROL) 0.25 MCG capsule Take 1 capsule (0.25 mcg total) by mouth daily. 06/14/13   Coral Spikes, DO  ezetimibe (ZETIA) 10 MG tablet Take 10 mg by mouth every morning.     Historical Provider, MD  furosemide (LASIX) 40 MG tablet Take 1 tablet by mouth daily. 01/17/14   Historical Provider, MD  gemfibrozil (LOPID) 600 MG tablet Take 600 mg by mouth 2 (two) times daily.     Historical Provider, MD  hydrochlorothiazide (HYDRODIURIL) 25 MG tablet Take 1 tablet by mouth daily. 01/23/14   Historical Provider, MD  insulin NPH (HUMULIN N,NOVOLIN N) 100 UNIT/ML injection Inject 15-20 Units into the skin 2 (two) times daily. Take 20 units SQ QAM and 15 units SQ QPM 04/28/13   Kathie Dike, MD  isosorbide mononitrate (IMDUR) 60 MG 24 hr tablet Take 1 tablet (60 mg total) by mouth daily. 12/12/13   Arnoldo Lenis, MD  metoprolol tartrate (LOPRESSOR) 25 MG tablet Take 0.5 tablets (12.5 mg total) by mouth 2 (two) times daily. 09/26/13   Arnoldo Lenis, MD  nitroGLYCERIN (NITROSTAT) 0.4 MG SL tablet Place 1 tablet (0.4 mg total) under the tongue every 5 (five) minutes x 3 doses as needed for chest pain. Chest Pain 02/19/14   Arnoldo Lenis, MD  omeprazole (PRILOSEC) 20 MG capsule Take 20 mg by mouth every  morning.     Historical Provider, MD  tamsulosin (FLOMAX) 0.4 MG CAPS capsule Take 0.4 mg by mouth daily.    Historical Provider, MD   BP 170/93  Pulse 102  Temp(Src) 98.4 F (36.9 C) (Oral)  Resp 20  Ht 5\' 8"  (1.727 m)  Wt 213 lb (96.616 kg)  BMI 32.39 kg/m2  SpO2 96% Physical Exam  Nursing note and vitals reviewed. Constitutional: He appears well-developed and well-nourished. No distress.  HENT:  Head: Normocephalic.  Mouth/Throat: Oropharynx is clear and moist. No oropharyngeal exudate.  Hematoma to the right temporoparietal scalp. No malocclusion, no hemotympanum, no raccoon eyes, no battle sign  Eyes: Conjunctivae and EOM are normal. Pupils are equal, round, and reactive to light. Right eye exhibits no discharge. Left eye exhibits no discharge. No scleral icterus.  Neck: No JVD present. No thyromegaly present.  Cardiovascular: Normal rate, regular rhythm, normal heart sounds  and intact distal pulses.  Exam reveals no gallop and no friction rub.   No murmur heard. Pulmonary/Chest: Effort normal and breath sounds normal. No respiratory distress. He has no wheezes. He has no rales.  Abdominal: Soft. Bowel sounds are normal. He exhibits no distension and no mass. There is no tenderness.  Musculoskeletal: Normal range of motion. He exhibits tenderness (tenderness over the right shoulder, right clavicle, cervical spine). He exhibits no edema.  Moves all extremities x4 with minimal difficulty, the right shoulder in its right upper extremity movement somewhat, old joints are supple, compartment are soft  Lymphadenopathy:    He has no cervical adenopathy.  Neurological: He is alert. Coordination normal.  Normal level of consciousness, memory deficit to the event, follows commands, normal strength, normal cranial nerves III through XII  Skin: Skin is warm and dry. No rash noted. No erythema.  Psychiatric: He has a normal mood and affect. His behavior is normal.    ED Course  Procedures  (including critical care time) Labs Review Labs Reviewed  CBC WITH DIFFERENTIAL - Abnormal; Notable for the following:    RBC 2.90 (*)    Hemoglobin 8.2 (*)    HCT 25.5 (*)    RDW 15.6 (*)    Neutrophils Relative % 79 (*)    Lymphocytes Relative 10 (*)    All other components within normal limits  COMPREHENSIVE METABOLIC PANEL - Abnormal; Notable for the following:    Potassium 3.3 (*)    Glucose, Bld 130 (*)    BUN 53 (*)    Creatinine, Ser 4.17 (*)    Albumin 3.3 (*)    Total Bilirubin 0.2 (*)    GFR calc non Af Amer 12 (*)    GFR calc Af Amer 14 (*)    Anion gap 20 (*)    All other components within normal limits  TROPONIN I    Imaging Review Dg Shoulder Right  03/02/2014   CLINICAL DATA:  Trauma, fall, syncope anterior left shoulder pain  EXAM: RIGHT SHOULDER - 2+ VIEW  COMPARISON:  None.  FINDINGS: Three views of the right shoulder submitted. No acute fracture or subluxation. Mild degenerative changes AC joint. Mild narrowing of glenohumeral joint space.  IMPRESSION: No acute fracture or subluxation.  Mild degenerative changes.   Electronically Signed   By: Lahoma Crocker M.D.   On: 03/02/2014 17:48   Ct Head Wo Contrast  03/02/2014   CLINICAL DATA:  Trauma, neck pain, fall, syncope  EXAM: CT HEAD WITHOUT CONTRAST  CT CERVICAL SPINE WITHOUT CONTRAST  TECHNIQUE: Multidetector CT imaging of the head and cervical spine was performed following the standard protocol without intravenous contrast. Multiplanar CT image reconstructions of the cervical spine were also generated.  COMPARISON:  12/13/2012  FINDINGS: CT HEAD FINDINGS  No skull fracture is noted. Mild scalp swelling and subcutaneous stranding in right posterior parietal region.  No mass effect or midline shift. There is small focus of hemorrhage along the interhemispheric fissure axial image 23 measures 5 x 9 mm. Additional tiny hemorrhage along the interhemispheric fissure measures 3 mm axial image 24.  No intraventricular  hemorrhage. Stable atrophy and chronic white matter disease.  CT CERVICAL SPINE FINDINGS  Axial images of the cervical spine shows no acute fracture or subluxation. Atherosclerotic calcifications of vertebral arteries are noted. Atherosclerotic calcifications of carotid bifurcation bilaterally. There is no pneumothorax in visualized lung apices.  Computer processed images shows degenerative changes C1-C2 articulation. There is disc space flattening with  mild anterior and mild posterior spurring at C4-C5 level. Disc space flattening with mild anterior and mild posterior spurring at C5-C6 and C6-C7 level. Mild disc space flattening with posterior spurring at C7-T1 level. No prevertebral soft tissue swelling. Cervical airway is patent.  IMPRESSION: 1. No skull fracture is noted. There are tiny foci of acute hemorrhage along the interhemispheric fissure measures 9 x 5 mm axial image 22 and 3 mm axial image 24. No mass effect or midline shift. Stable atrophy and chronic white matter disease. No intraventricular hemorrhage. 2. No cervical spine acute fracture or subluxation. Degenerative changes as described above. No prevertebral soft tissue swelling. Cervical airway is patent. These results were called by telephone at the time of interpretation on 03/02/2014 at 6:05 pm to Dr. Noemi Chapel , who verbally acknowledged these results.   Electronically Signed   By: Lahoma Crocker M.D.   On: 03/02/2014 18:06   Ct Cervical Spine Wo Contrast  03/02/2014   CLINICAL DATA:  Trauma, neck pain, fall, syncope  EXAM: CT HEAD WITHOUT CONTRAST  CT CERVICAL SPINE WITHOUT CONTRAST  TECHNIQUE: Multidetector CT imaging of the head and cervical spine was performed following the standard protocol without intravenous contrast. Multiplanar CT image reconstructions of the cervical spine were also generated.  COMPARISON:  12/13/2012  FINDINGS: CT HEAD FINDINGS  No skull fracture is noted. Mild scalp swelling and subcutaneous stranding in right  posterior parietal region.  No mass effect or midline shift. There is small focus of hemorrhage along the interhemispheric fissure axial image 23 measures 5 x 9 mm. Additional tiny hemorrhage along the interhemispheric fissure measures 3 mm axial image 24.  No intraventricular hemorrhage. Stable atrophy and chronic white matter disease.  CT CERVICAL SPINE FINDINGS  Axial images of the cervical spine shows no acute fracture or subluxation. Atherosclerotic calcifications of vertebral arteries are noted. Atherosclerotic calcifications of carotid bifurcation bilaterally. There is no pneumothorax in visualized lung apices.  Computer processed images shows degenerative changes C1-C2 articulation. There is disc space flattening with mild anterior and mild posterior spurring at C4-C5 level. Disc space flattening with mild anterior and mild posterior spurring at C5-C6 and C6-C7 level. Mild disc space flattening with posterior spurring at C7-T1 level. No prevertebral soft tissue swelling. Cervical airway is patent.  IMPRESSION: 1. No skull fracture is noted. There are tiny foci of acute hemorrhage along the interhemispheric fissure measures 9 x 5 mm axial image 22 and 3 mm axial image 24. No mass effect or midline shift. Stable atrophy and chronic white matter disease. No intraventricular hemorrhage. 2. No cervical spine acute fracture or subluxation. Degenerative changes as described above. No prevertebral soft tissue swelling. Cervical airway is patent. These results were called by telephone at the time of interpretation on 03/02/2014 at 6:05 pm to Dr. Noemi Chapel , who verbally acknowledged these results.   Electronically Signed   By: Lahoma Crocker M.D.   On: 03/02/2014 18:06     EKG Interpretation   Date/Time:  Friday March 02 2014 16:58:47 EDT Ventricular Rate:  98 PR Interval:  210 QRS Duration: 146 QT Interval:  403 QTC Calculation: 515 R Axis:   -10 Text Interpretation:  Sinus rhythm Atrial premature  complex Borderline  prolonged PR interval Right bundle branch block Abnormal ekg since last  tracing no significant change Confirmed by Krisalyn Yankowski  MD, Kyndall Amero ([id number]) on  03/02/2014 5:32:16 PM      MDM   Final diagnoses:  Subdural hematoma  Syncope, unspecified syncope type  Anemia, unspecified anemia type  Chronic renal failure, stage 4 (severe)    The patient has neck pain, shoulder pain and head injury, etiology of his syncopal event is unknown as the patient had no prodromal symptoms. Cardiac monitoring, left thumb EKG, imaging.  The patient had no further cardiac arrhythmias in the emergency department, there was no signs of abnormal mental status changes, no seizure activity however his CT scan of the head did show a small amount of subdural hematoma, this was discussed with the accepting physician at [facility] Dr. Tamala Julian who has accepted the patient in transfer. The patient will need to be any center with neurosurgery and has requested this hospital as he has a history of requiring urologic intervention at this hospital in the past.  The patient does have anemia though this is improved since last checked, recent blood transfusion. Renal function is at baseline, creatinine over 4.  The patient is not currently anticoagulated with novel oral anticoagulants or Coumadin, no indication for FFP or vitamin K, monitoring of neurologic exam with frequent exams and repeat exam shows no signs of decline.  CRITICAL CARE Performed by: Johnna Acosta Total critical care time: 35 Critical care time was exclusive of separately billable procedures and treating other patients. Critical care was necessary to treat or prevent imminent or life-threatening deterioration. Critical care was time spent personally by me on the following activities: development of treatment plan with patient and/or surrogate as well as nursing, discussions with consultants, evaluation of patient's  response to treatment, examination of patient, obtaining history from patient or surrogate, ordering and performing treatments and interventions, ordering and review of laboratory studies, ordering and review of radiographic studies, pulse oximetry and re-evaluation of patient's condition.   Johnna Acosta, MD 03/02/14 [phone]

## 2014-03-02 NOTE — ED Notes (Addendum)
[address] outside after syncopal episode. Patient does not recall what happened.  Wife witnessed event and states "he just went out".  Fell onto [employer].  C/o R shoulder and clavicle pain. Patient now A/O x 3.

## 2014-03-02 NOTE — Progress Notes (Signed)
Clinical Summary Mr. Delarosa is a 80 y.o.male seen today for follow up of the following medical problems.   1. Chest pain/CAD  - Prior lexiscan MPI that showed moderate area of ischemia in the inferolateral wall, overall intermediate risk. Had been medically managed due to advanced kidney disease and history of bladder tumor  - admit to [facility] with NSTEMI Jan 2015, cath as described below,received DES to LCX. Noted RCA disease with recs for medical management at this time to conserve dye load and renal function.  - Echo Jan 2015 LVEF 60-65% .  - recent admit to [facility] with displaced nephrostomy tube, anemia, and chest pain.  Hgb was 6.8 at that time due to hematuria, transfused 2 units of pRBCs.Seen by inpatient cardiology, brillinta stopped at that time, remains on ASA. No chest pain since discharge.   2. HTN  - does not check regularly  - compliant with meds   3. DM  - followed by Dr Gerarda Fraction   4 . Hyperlipidemia  - on gemfibrozil and zetia, followed Dr Gerarda Fraction.  - his statin history is unclear, appears he was on zocor a few years ago but from our records I cannot determine why it was stopped. The patient does not recall either.  - reports he has appt with pcp tomorrow  5. Urinary obstruction - manged at [facility], has nephrostomy tubes  Past Medical History  Diagnosis Date  . Dyslipidemia   . DJD (degenerative joint disease)   . Hypertension   . Diabetes mellitus   . BPH (benign prostatic hyperplasia)   . CKD (chronic kidney disease) stage 3, GFR 30-59 ml/min   . Coronary artery disease     a. 1997 s/p MI and prior RCA stenting;  b. 04/2013 MV: inflat ischemia;  c. 05/2013 Cath/PCI: LM 10-20, LAD 30-40p, LCX 95p (3.0x15 Xience DES), 64m, OM1/2 30, RCA 50p diff ISR, 90d (med Rx).  . Bladder cancer   . Stroke      Allergies  Allergen Reactions  . Aspirin     Due to swelling with celecoxib  . Celecoxib Swelling  . Hydrocodone-Acetaminophen Itching  . Morphine  Itching  . Niacin Itching  . Piroxicam Itching     Current Outpatient Prescriptions  Medication Sig Dispense Refill  . amLODipine (NORVASC) 10 MG tablet Take 10 mg by mouth daily.      Marland Kitchen aspirin EC 81 MG tablet Take 81 mg by mouth daily.      Marland Kitchen atorvastatin (LIPITOR) 20 MG tablet Take 1 tablet by mouth daily.      . calcitRIOL (ROCALTROL) 0.25 MCG capsule Take 1 capsule (0.25 mcg total) by mouth daily.  30 capsule  6  . clopidogrel (PLAVIX) 75 MG tablet Take 1 tablet by mouth daily.      Marland Kitchen ezetimibe (ZETIA) 10 MG tablet Take 10 mg by mouth every morning.       . furosemide (LASIX) 40 MG tablet Take 1 tablet by mouth daily.      Marland Kitchen gemfibrozil (LOPID) 600 MG tablet Take 600 mg by mouth 2 (two) times daily.       . hydrochlorothiazide (HYDRODIURIL) 25 MG tablet Take 1 tablet by mouth daily.      . insulin NPH (HUMULIN N,NOVOLIN N) 100 UNIT/ML injection Inject 15-20 Units into the skin 2 (two) times daily. Take 20 units SQ QAM and 15 units SQ QPM  1 vial  12  . isosorbide mononitrate (IMDUR) 60 MG 24 hr tablet  Take 1 tablet (60 mg total) by mouth daily.  30 tablet  6  . metoprolol tartrate (LOPRESSOR) 25 MG tablet Take 0.5 tablets (12.5 mg total) by mouth 2 (two) times daily.  30 tablet  6  . nitroGLYCERIN (NITROSTAT) 0.4 MG SL tablet Place 1 tablet (0.4 mg total) under the tongue every 5 (five) minutes x 3 doses as needed for chest pain. Chest Pain  25 tablet  3  . omeprazole (PRILOSEC) 20 MG capsule Take 20 mg by mouth every morning.       . tamsulosin (FLOMAX) 0.4 MG CAPS capsule Take 0.4 mg by mouth daily.      . ticagrelor (BRILINTA) 90 MG TABS tablet Take 1 tablet (90 mg total) by mouth 2 (two) times daily.  60 tablet  6   No current facility-administered medications for this visit.     Past Surgical History  Procedure Laterality Date  . Cholecystectomy    . Esophagogastroduodenoscopy  06/08/2002    Soft stricture of the GE  junction with erosive esophagitis. The stricture was  dilated to 43 [place] using a Maloney dilator. Swollen fold at GE junction on the gastric site which was biopsied for histology and was suspicious for sentinel folds.  . Colonoscopy  06/08/2002    Small polyp was oblated via cold biopsy from the cecum and two were snared, one was at the rectosigmoid junction measuring about a centimeter, another one at the rectum which was smaller.  . Transurethral resection of bladder tumor      x7  . Coronary angioplasty  1997    3 stents  . Transurethral resection of bladder tumor  03/15/2012    Procedure: TRANSURETHRAL RESECTION OF BLADDER TUMOR (TURBT);  Surgeon: Marissa Nestle, MD;  Location: [facility];  Service: Urology;  Laterality: N/A;  . Cataract extraction w/phaco  06/20/2012    Procedure: CATARACT EXTRACTION PHACO AND INTRAOCULAR LENS PLACEMENT (IOC);  Surgeon: Tonny Dannetta Lekas, MD;  Location: [facility];  Service: Ophthalmology;  Laterality: Right;  CDE=16.59  . Cataract extraction w/phaco Left 07/04/2012    Procedure: CATARACT EXTRACTION PHACO AND INTRAOCULAR LENS PLACEMENT (IOC);  Surgeon: Tonny Lenardo Westwood, MD;  Location: [facility];  Service: Ophthalmology;  Laterality: Left;  CDE: 24.33  . Transurethral resection of bladder tumor N/A 09/29/2012    Procedure: TRANSURETHRAL RESECTION OF BLADDER TUMOR (TURBT);  Surgeon: Marissa Nestle, MD;  Location: [facility];  Service: Urology;  Laterality: N/A;  . Multiple tooth extractions    . Cystoscopy with biopsy N/A 04/27/2013    Procedure: CYSTOSCOPY WITH BLADDER BIOPSY & FULGURATION;  Surgeon: Marissa Nestle, MD;  Location: [facility];  Service: Urology;  Laterality: N/A;     Allergies  Allergen Reactions  . Aspirin     Due to swelling with celecoxib  . Celecoxib Swelling  . Hydrocodone-Acetaminophen Itching  . Morphine Itching  . Niacin Itching  . Piroxicam Itching      Family History  Problem Relation Age of Onset  . Stroke Mother 94  . Diabetes Brother 75     Social History Mr. Coury reports that he  quit smoking about 33 years ago. His smoking use included Cigarettes. He has a 10 pack-year smoking history. He has never used smokeless tobacco. Mr. Heggs reports that he does not drink alcohol.   Review of Systems CONSTITUTIONAL: No weight loss, fever, chills, weakness or fatigue.  HEENT: Eyes: No visual loss, blurred vision, double vision or yellow sclerae.No hearing loss, sneezing, congestion, runny nose  or sore throat.  SKIN: No rash or itching.  CARDIOVASCULAR: per HPI RESPIRATORY: No shortness of breath, cough or sputum.  GASTROINTESTINAL: No anorexia, nausea, vomiting or diarrhea. No abdominal pain or blood.  GENITOURINARY: No burning on urination, no polyuria NEUROLOGICAL: No headache, dizziness, syncope, paralysis, ataxia, numbness or tingling in the extremities. No change in bowel or bladder control.  MUSCULOSKELETAL: No muscle, back pain, joint pain or stiffness.  LYMPHATICS: No enlarged nodes. No history of splenectomy.  PSYCHIATRIC: No history of depression or anxiety.  ENDOCRINOLOGIC: No reports of sweating, cold or heat intolerance. No polyuria or polydipsia.  Marland Kitchen   Physical Examination p 84 bp 140/70 Wt 213 lbs BMI 33 Gen: resting comfortably, no acute distress HEENT: no scleral icterus, pupils equal round and reactive, no palptable cervical adenopathy,  CV: RRR, no m/r/g, no JVD. + bilateral carotis bruits Resp: Clear to auscultation bilaterally GI: abdomen is soft, non-tender, non-distended, normal bowel sounds, no hepatosplenomegaly MSK: extremities are warm, no edema.  Skin: warm, no rash Neuro:  no focal deficits Psych: appropriate affect   Diagnostic Studies 11/2012 Echo: LVEF 55-60%, mild LVH, no WMAs,   11/2012 Carotid US:  Summary: Right: mild to moderate mixed plaque origin and proximal ICA.Left: moderate mixed plaque origin ICA and moderate soft plaque proximal ICA. Bilateral: 0-39% ICA stenosis. Vertebral artery flow is antegrade.  05/22/13  Lexiscan MPI  Intermediate risk Lexiscan Cardiolite as outlined. There were no clearly diagnostic ST segment changes, no arrhythmias were noted. Moderate-sized inferolateral ischemic defect noted consistent with potential circumflex or RCA stenosis. LVEF 54% with normal LV volumes.  Cardiac Catheterization 1.16.2015  Procedural Findings:  Hemodynamics:  AO 143/74 mean 106 mm Hg  LV 144/24 mm Hg  Coronary angiography:  Coronary dominance: right  Left mainstem: Mild tapering distal to 10-20%.  Left anterior descending (LAD): The proximal LAD is heavily calcified. There is diffuse 30-40% disease in the proximal vessel. There are multiple small diagonals without significant disease.  Left circumflex (LCx): The LCx gives off 2 OM branches that are moderate in size. The proximal LCx is heavily calcified with segmental 95% stenosis. The mid LCx and both OMs have mild disease diffusely to 30%.  Right coronary artery (RCA): The RCA appears to have stents from the proximal vessel to past the crux. There is diffuse calcification. There is diffuse 50% disease throughout the stented segment. There is a focal 90% stenosis at the crux that appears to be within the stent.  Left ventriculography: Not done.  Final Conclusions:  1. 2 vessel obstructive CAD. The culprit lesion appears to be the proximal LCx. The RCA has extensive stenting with moderate diffuse in stent disease and a focal area of severe stenosis at the crux.  Recommendations: Recommend hydration over the weekend with close monitoring of renal function. Given complexity of disease I would recommend staging PCI on Monday. Will load with Brilinta. I anticipate treating the LCx will require rotational atherectomy given the heavy calcification and I would recommend DES since he has complex lesion and CKD given high risk of restenosis. It may be best to treat the culprit vessel only then follow on medical therapy for the RCA disease.  _____________    Percutaneous Coronary Intervention 1.19.2015  Conclusions: Successful PCI of the LCx with a 3.0 x 15 mm Xience DES using adjunctive rotational atherectomy  Recommendations: ASA, Brilinta at least 12 months, aggressive med Rx for residual CAD.        Assessment and Plan   1.  CAD  - s/p DES to LCX Jan 2015. Medically managing RCA disease. Completed more than 6 months of brillinta, now off after recent admission with hematuria and severe anemia. Will not restart, continue ASA - no recent chest pain  2. HTN - at goal, continue current meds  3. Hyperlipidemia  - unclear statin history, he is on zetia and gemfibrozil so I presume he had some form of adverse reaction to statin previously. I do not have these records. I have requested the patient discuss with his PCP at there visit tomorrow. If no strong contraindication recommend high dose statin therapy.   4. Bladder obstruction  - bilateral nephrostomy tubes followed by urology.    F/u 4 months    Arnoldo Lenis, M.D.

## 2014-03-14 ENCOUNTER — Other Ambulatory Visit: Payer: Self-pay | Admitting: Family Medicine

## 2014-03-14 ENCOUNTER — Telehealth: Payer: Self-pay | Admitting: Cardiology

## 2014-03-14 NOTE — Telephone Encounter (Signed)
Received fax refill request  Rx # [id number] Medication:  Calcitriol 0.25 mcg caps Qty 30 Sig:  Take one capsule by mouth every day  Physician:  Harl Bowie

## 2014-03-19 ENCOUNTER — Other Ambulatory Visit: Payer: Self-pay | Admitting: Family Medicine

## 2014-05-03 ENCOUNTER — Encounter (HOSPITAL_COMMUNITY): Payer: Self-pay | Admitting: Cardiology

## 2014-05-21 ENCOUNTER — Other Ambulatory Visit: Payer: Self-pay | Admitting: Cardiology

## 2014-05-22 MED ORDER — METOPROLOL TARTRATE 25 MG PO TABS: ORAL_TABLET | ORAL | Status: DC

## 2014-05-22 NOTE — Addendum Note (Signed)
Addended by: Julian Hy T on: 05/22/2014 08:12 AM   Modules accepted: Orders

## 2014-05-26 DIAGNOSIS — Z992 Dependence on renal dialysis: Secondary | ICD-10-CM | POA: Diagnosis not present

## 2014-05-26 DIAGNOSIS — D509 Iron deficiency anemia, unspecified: Secondary | ICD-10-CM | POA: Diagnosis not present

## 2014-05-26 DIAGNOSIS — N186 End stage renal disease: Secondary | ICD-10-CM | POA: Diagnosis not present

## 2014-05-26 DIAGNOSIS — D631 Anemia in chronic kidney disease: Secondary | ICD-10-CM | POA: Diagnosis not present

## 2014-05-26 DIAGNOSIS — N2581 Secondary hyperparathyroidism of renal origin: Secondary | ICD-10-CM | POA: Diagnosis not present

## 2014-05-29 DIAGNOSIS — N2581 Secondary hyperparathyroidism of renal origin: Secondary | ICD-10-CM | POA: Diagnosis not present

## 2014-05-29 DIAGNOSIS — N186 End stage renal disease: Secondary | ICD-10-CM | POA: Diagnosis not present

## 2014-05-29 DIAGNOSIS — Z992 Dependence on renal dialysis: Secondary | ICD-10-CM | POA: Diagnosis not present

## 2014-05-29 DIAGNOSIS — D509 Iron deficiency anemia, unspecified: Secondary | ICD-10-CM | POA: Diagnosis not present

## 2014-05-29 DIAGNOSIS — D631 Anemia in chronic kidney disease: Secondary | ICD-10-CM | POA: Diagnosis not present

## 2014-05-31 DIAGNOSIS — D631 Anemia in chronic kidney disease: Secondary | ICD-10-CM | POA: Diagnosis not present

## 2014-05-31 DIAGNOSIS — D509 Iron deficiency anemia, unspecified: Secondary | ICD-10-CM | POA: Diagnosis not present

## 2014-05-31 DIAGNOSIS — Z992 Dependence on renal dialysis: Secondary | ICD-10-CM | POA: Diagnosis not present

## 2014-05-31 DIAGNOSIS — N2581 Secondary hyperparathyroidism of renal origin: Secondary | ICD-10-CM | POA: Diagnosis not present

## 2014-05-31 DIAGNOSIS — N186 End stage renal disease: Secondary | ICD-10-CM | POA: Diagnosis not present

## 2014-06-01 ENCOUNTER — Encounter: Payer: Self-pay | Admitting: Vascular Surgery

## 2014-06-01 ENCOUNTER — Other Ambulatory Visit: Payer: Self-pay | Admitting: *Deleted

## 2014-06-01 DIAGNOSIS — N185 Chronic kidney disease, stage 5: Secondary | ICD-10-CM

## 2014-06-01 DIAGNOSIS — Z01810 Encounter for preprocedural cardiovascular examination: Secondary | ICD-10-CM

## 2014-06-02 DIAGNOSIS — Z992 Dependence on renal dialysis: Secondary | ICD-10-CM | POA: Diagnosis not present

## 2014-06-02 DIAGNOSIS — N186 End stage renal disease: Secondary | ICD-10-CM | POA: Diagnosis not present

## 2014-06-02 DIAGNOSIS — D631 Anemia in chronic kidney disease: Secondary | ICD-10-CM | POA: Diagnosis not present

## 2014-06-02 DIAGNOSIS — N2581 Secondary hyperparathyroidism of renal origin: Secondary | ICD-10-CM | POA: Diagnosis not present

## 2014-06-02 DIAGNOSIS — D509 Iron deficiency anemia, unspecified: Secondary | ICD-10-CM | POA: Diagnosis not present

## 2014-06-05 DIAGNOSIS — D631 Anemia in chronic kidney disease: Secondary | ICD-10-CM | POA: Diagnosis not present

## 2014-06-05 DIAGNOSIS — E119 Type 2 diabetes mellitus without complications: Secondary | ICD-10-CM | POA: Diagnosis not present

## 2014-06-05 DIAGNOSIS — N2581 Secondary hyperparathyroidism of renal origin: Secondary | ICD-10-CM | POA: Diagnosis not present

## 2014-06-05 DIAGNOSIS — Z992 Dependence on renal dialysis: Secondary | ICD-10-CM | POA: Diagnosis not present

## 2014-06-05 DIAGNOSIS — D509 Iron deficiency anemia, unspecified: Secondary | ICD-10-CM | POA: Diagnosis not present

## 2014-06-05 DIAGNOSIS — N186 End stage renal disease: Secondary | ICD-10-CM | POA: Diagnosis not present

## 2014-06-07 DIAGNOSIS — D631 Anemia in chronic kidney disease: Secondary | ICD-10-CM | POA: Diagnosis not present

## 2014-06-07 DIAGNOSIS — Z992 Dependence on renal dialysis: Secondary | ICD-10-CM | POA: Diagnosis not present

## 2014-06-07 DIAGNOSIS — N186 End stage renal disease: Secondary | ICD-10-CM | POA: Diagnosis not present

## 2014-06-07 DIAGNOSIS — D509 Iron deficiency anemia, unspecified: Secondary | ICD-10-CM | POA: Diagnosis not present

## 2014-06-07 DIAGNOSIS — N2581 Secondary hyperparathyroidism of renal origin: Secondary | ICD-10-CM | POA: Diagnosis not present

## 2014-06-09 DIAGNOSIS — D631 Anemia in chronic kidney disease: Secondary | ICD-10-CM | POA: Diagnosis not present

## 2014-06-09 DIAGNOSIS — N2581 Secondary hyperparathyroidism of renal origin: Secondary | ICD-10-CM | POA: Diagnosis not present

## 2014-06-09 DIAGNOSIS — N186 End stage renal disease: Secondary | ICD-10-CM | POA: Diagnosis not present

## 2014-06-09 DIAGNOSIS — Z992 Dependence on renal dialysis: Secondary | ICD-10-CM | POA: Diagnosis not present

## 2014-06-09 DIAGNOSIS — D509 Iron deficiency anemia, unspecified: Secondary | ICD-10-CM | POA: Diagnosis not present

## 2014-06-12 DIAGNOSIS — N186 End stage renal disease: Secondary | ICD-10-CM | POA: Diagnosis not present

## 2014-06-12 DIAGNOSIS — Z992 Dependence on renal dialysis: Secondary | ICD-10-CM | POA: Diagnosis not present

## 2014-06-12 DIAGNOSIS — N2581 Secondary hyperparathyroidism of renal origin: Secondary | ICD-10-CM | POA: Diagnosis not present

## 2014-06-12 DIAGNOSIS — D631 Anemia in chronic kidney disease: Secondary | ICD-10-CM | POA: Diagnosis not present

## 2014-06-12 DIAGNOSIS — D509 Iron deficiency anemia, unspecified: Secondary | ICD-10-CM | POA: Diagnosis not present

## 2014-06-14 DIAGNOSIS — D631 Anemia in chronic kidney disease: Secondary | ICD-10-CM | POA: Diagnosis not present

## 2014-06-14 DIAGNOSIS — N186 End stage renal disease: Secondary | ICD-10-CM | POA: Diagnosis not present

## 2014-06-14 DIAGNOSIS — Z992 Dependence on renal dialysis: Secondary | ICD-10-CM | POA: Diagnosis not present

## 2014-06-14 DIAGNOSIS — D509 Iron deficiency anemia, unspecified: Secondary | ICD-10-CM | POA: Diagnosis not present

## 2014-06-14 DIAGNOSIS — N2581 Secondary hyperparathyroidism of renal origin: Secondary | ICD-10-CM | POA: Diagnosis not present

## 2014-06-15 ENCOUNTER — Other Ambulatory Visit (HOSPITAL_COMMUNITY): Payer: Medicaid Other

## 2014-06-15 ENCOUNTER — Encounter (HOSPITAL_COMMUNITY): Payer: Medicaid Other

## 2014-06-15 ENCOUNTER — Ambulatory Visit: Payer: Medicaid Other | Admitting: Vascular Surgery

## 2014-06-21 ENCOUNTER — Encounter: Payer: Self-pay | Admitting: Vascular Surgery

## 2014-06-21 DIAGNOSIS — N186 End stage renal disease: Secondary | ICD-10-CM | POA: Diagnosis not present

## 2014-06-21 DIAGNOSIS — D509 Iron deficiency anemia, unspecified: Secondary | ICD-10-CM | POA: Diagnosis not present

## 2014-06-21 DIAGNOSIS — Z992 Dependence on renal dialysis: Secondary | ICD-10-CM | POA: Diagnosis not present

## 2014-06-21 DIAGNOSIS — N2581 Secondary hyperparathyroidism of renal origin: Secondary | ICD-10-CM | POA: Diagnosis not present

## 2014-06-21 DIAGNOSIS — D631 Anemia in chronic kidney disease: Secondary | ICD-10-CM | POA: Diagnosis not present

## 2014-06-22 ENCOUNTER — Other Ambulatory Visit (HOSPITAL_COMMUNITY): Payer: Medicaid Other

## 2014-06-22 ENCOUNTER — Ambulatory Visit: Payer: Medicaid Other | Admitting: Vascular Surgery

## 2014-06-22 ENCOUNTER — Encounter (HOSPITAL_COMMUNITY): Payer: Medicaid Other

## 2014-06-23 DIAGNOSIS — D631 Anemia in chronic kidney disease: Secondary | ICD-10-CM | POA: Diagnosis not present

## 2014-06-23 DIAGNOSIS — N2581 Secondary hyperparathyroidism of renal origin: Secondary | ICD-10-CM | POA: Diagnosis not present

## 2014-06-23 DIAGNOSIS — N186 End stage renal disease: Secondary | ICD-10-CM | POA: Diagnosis not present

## 2014-06-23 DIAGNOSIS — Z992 Dependence on renal dialysis: Secondary | ICD-10-CM | POA: Diagnosis not present

## 2014-06-23 DIAGNOSIS — D509 Iron deficiency anemia, unspecified: Secondary | ICD-10-CM | POA: Diagnosis not present

## 2014-06-24 DIAGNOSIS — Z992 Dependence on renal dialysis: Secondary | ICD-10-CM | POA: Diagnosis not present

## 2014-06-24 DIAGNOSIS — N186 End stage renal disease: Secondary | ICD-10-CM | POA: Diagnosis not present

## 2014-06-26 DIAGNOSIS — Z992 Dependence on renal dialysis: Secondary | ICD-10-CM | POA: Diagnosis not present

## 2014-06-26 DIAGNOSIS — D509 Iron deficiency anemia, unspecified: Secondary | ICD-10-CM | POA: Diagnosis not present

## 2014-06-26 DIAGNOSIS — N186 End stage renal disease: Secondary | ICD-10-CM | POA: Diagnosis not present

## 2014-06-26 DIAGNOSIS — D631 Anemia in chronic kidney disease: Secondary | ICD-10-CM | POA: Diagnosis not present

## 2014-06-26 DIAGNOSIS — N2581 Secondary hyperparathyroidism of renal origin: Secondary | ICD-10-CM | POA: Diagnosis not present

## 2014-06-26 DIAGNOSIS — Z23 Encounter for immunization: Secondary | ICD-10-CM | POA: Diagnosis not present

## 2014-06-26 DIAGNOSIS — R6883 Chills (without fever): Secondary | ICD-10-CM | POA: Diagnosis not present

## 2014-06-27 DIAGNOSIS — N1330 Unspecified hydronephrosis: Secondary | ICD-10-CM | POA: Diagnosis not present

## 2014-06-27 DIAGNOSIS — N1339 Other hydronephrosis: Secondary | ICD-10-CM | POA: Diagnosis not present

## 2014-06-27 DIAGNOSIS — Z436 Encounter for attention to other artificial openings of urinary tract: Secondary | ICD-10-CM | POA: Diagnosis not present

## 2014-06-27 DIAGNOSIS — Z936 Other artificial openings of urinary tract status: Secondary | ICD-10-CM | POA: Diagnosis not present

## 2014-06-27 DIAGNOSIS — Z4803 Encounter for change or removal of drains: Secondary | ICD-10-CM | POA: Diagnosis not present

## 2014-06-28 DIAGNOSIS — R6883 Chills (without fever): Secondary | ICD-10-CM | POA: Diagnosis not present

## 2014-06-28 DIAGNOSIS — N2581 Secondary hyperparathyroidism of renal origin: Secondary | ICD-10-CM | POA: Diagnosis not present

## 2014-06-28 DIAGNOSIS — D509 Iron deficiency anemia, unspecified: Secondary | ICD-10-CM | POA: Diagnosis not present

## 2014-06-28 DIAGNOSIS — Z992 Dependence on renal dialysis: Secondary | ICD-10-CM | POA: Diagnosis not present

## 2014-06-28 DIAGNOSIS — D631 Anemia in chronic kidney disease: Secondary | ICD-10-CM | POA: Diagnosis not present

## 2014-06-28 DIAGNOSIS — N186 End stage renal disease: Secondary | ICD-10-CM | POA: Diagnosis not present

## 2014-06-28 DIAGNOSIS — Z23 Encounter for immunization: Secondary | ICD-10-CM | POA: Diagnosis not present

## 2014-06-30 DIAGNOSIS — D509 Iron deficiency anemia, unspecified: Secondary | ICD-10-CM | POA: Diagnosis not present

## 2014-06-30 DIAGNOSIS — Z23 Encounter for immunization: Secondary | ICD-10-CM | POA: Diagnosis not present

## 2014-06-30 DIAGNOSIS — D631 Anemia in chronic kidney disease: Secondary | ICD-10-CM | POA: Diagnosis not present

## 2014-06-30 DIAGNOSIS — N186 End stage renal disease: Secondary | ICD-10-CM | POA: Diagnosis not present

## 2014-06-30 DIAGNOSIS — Z992 Dependence on renal dialysis: Secondary | ICD-10-CM | POA: Diagnosis not present

## 2014-06-30 DIAGNOSIS — N2581 Secondary hyperparathyroidism of renal origin: Secondary | ICD-10-CM | POA: Diagnosis not present

## 2014-06-30 DIAGNOSIS — R6883 Chills (without fever): Secondary | ICD-10-CM | POA: Diagnosis not present

## 2014-07-03 DIAGNOSIS — N2581 Secondary hyperparathyroidism of renal origin: Secondary | ICD-10-CM | POA: Diagnosis not present

## 2014-07-03 DIAGNOSIS — D509 Iron deficiency anemia, unspecified: Secondary | ICD-10-CM | POA: Diagnosis not present

## 2014-07-03 DIAGNOSIS — R6883 Chills (without fever): Secondary | ICD-10-CM | POA: Diagnosis not present

## 2014-07-03 DIAGNOSIS — D631 Anemia in chronic kidney disease: Secondary | ICD-10-CM | POA: Diagnosis not present

## 2014-07-03 DIAGNOSIS — Z992 Dependence on renal dialysis: Secondary | ICD-10-CM | POA: Diagnosis not present

## 2014-07-03 DIAGNOSIS — N186 End stage renal disease: Secondary | ICD-10-CM | POA: Diagnosis not present

## 2014-07-03 DIAGNOSIS — Z23 Encounter for immunization: Secondary | ICD-10-CM | POA: Diagnosis not present

## 2014-07-05 DIAGNOSIS — D509 Iron deficiency anemia, unspecified: Secondary | ICD-10-CM | POA: Diagnosis not present

## 2014-07-05 DIAGNOSIS — D631 Anemia in chronic kidney disease: Secondary | ICD-10-CM | POA: Diagnosis not present

## 2014-07-05 DIAGNOSIS — N2581 Secondary hyperparathyroidism of renal origin: Secondary | ICD-10-CM | POA: Diagnosis not present

## 2014-07-05 DIAGNOSIS — Z992 Dependence on renal dialysis: Secondary | ICD-10-CM | POA: Diagnosis not present

## 2014-07-05 DIAGNOSIS — R6883 Chills (without fever): Secondary | ICD-10-CM | POA: Diagnosis not present

## 2014-07-05 DIAGNOSIS — Z23 Encounter for immunization: Secondary | ICD-10-CM | POA: Diagnosis not present

## 2014-07-05 DIAGNOSIS — N186 End stage renal disease: Secondary | ICD-10-CM | POA: Diagnosis not present

## 2014-07-07 DIAGNOSIS — Z992 Dependence on renal dialysis: Secondary | ICD-10-CM | POA: Diagnosis not present

## 2014-07-07 DIAGNOSIS — N186 End stage renal disease: Secondary | ICD-10-CM | POA: Diagnosis not present

## 2014-07-07 DIAGNOSIS — Z23 Encounter for immunization: Secondary | ICD-10-CM | POA: Diagnosis not present

## 2014-07-07 DIAGNOSIS — R6883 Chills (without fever): Secondary | ICD-10-CM | POA: Diagnosis not present

## 2014-07-07 DIAGNOSIS — D509 Iron deficiency anemia, unspecified: Secondary | ICD-10-CM | POA: Diagnosis not present

## 2014-07-07 DIAGNOSIS — N2581 Secondary hyperparathyroidism of renal origin: Secondary | ICD-10-CM | POA: Diagnosis not present

## 2014-07-07 DIAGNOSIS — D631 Anemia in chronic kidney disease: Secondary | ICD-10-CM | POA: Diagnosis not present

## 2014-07-10 DIAGNOSIS — D631 Anemia in chronic kidney disease: Secondary | ICD-10-CM | POA: Diagnosis not present

## 2014-07-10 DIAGNOSIS — R531 Weakness: Secondary | ICD-10-CM | POA: Diagnosis not present

## 2014-07-10 DIAGNOSIS — R6883 Chills (without fever): Secondary | ICD-10-CM | POA: Diagnosis not present

## 2014-07-10 DIAGNOSIS — Z992 Dependence on renal dialysis: Secondary | ICD-10-CM | POA: Diagnosis not present

## 2014-07-10 DIAGNOSIS — D509 Iron deficiency anemia, unspecified: Secondary | ICD-10-CM | POA: Diagnosis not present

## 2014-07-10 DIAGNOSIS — N2581 Secondary hyperparathyroidism of renal origin: Secondary | ICD-10-CM | POA: Diagnosis not present

## 2014-07-10 DIAGNOSIS — Z23 Encounter for immunization: Secondary | ICD-10-CM | POA: Diagnosis not present

## 2014-07-10 DIAGNOSIS — N186 End stage renal disease: Secondary | ICD-10-CM | POA: Diagnosis not present

## 2014-07-10 DIAGNOSIS — R404 Transient alteration of awareness: Secondary | ICD-10-CM | POA: Diagnosis not present

## 2014-07-12 ENCOUNTER — Emergency Department (HOSPITAL_COMMUNITY)
Admission: EM | Admit: 2014-07-12 | Discharge: 2014-07-12 | Disposition: A | Discharge status: Home / Self Care | Payer: Medicare Other | Attending: Emergency Medicine | Admitting: Emergency Medicine

## 2014-07-12 ENCOUNTER — Encounter (HOSPITAL_COMMUNITY): Payer: Self-pay

## 2014-07-12 DIAGNOSIS — Z79899 Other long term (current) drug therapy: Secondary | ICD-10-CM | POA: Insufficient documentation

## 2014-07-12 DIAGNOSIS — Z8673 Personal history of transient ischemic attack (TIA), and cerebral infarction without residual deficits: Secondary | ICD-10-CM | POA: Diagnosis not present

## 2014-07-12 DIAGNOSIS — N400 Enlarged prostate without lower urinary tract symptoms: Secondary | ICD-10-CM | POA: Diagnosis not present

## 2014-07-12 DIAGNOSIS — R42 Dizziness and giddiness: Secondary | ICD-10-CM | POA: Insufficient documentation

## 2014-07-12 DIAGNOSIS — Z7982 Long term (current) use of aspirin: Secondary | ICD-10-CM | POA: Diagnosis not present

## 2014-07-12 DIAGNOSIS — Z87891 Personal history of nicotine dependence: Secondary | ICD-10-CM | POA: Diagnosis not present

## 2014-07-12 DIAGNOSIS — Z23 Encounter for immunization: Secondary | ICD-10-CM | POA: Diagnosis not present

## 2014-07-12 DIAGNOSIS — R05 Cough: Secondary | ICD-10-CM | POA: Diagnosis not present

## 2014-07-12 DIAGNOSIS — E119 Type 2 diabetes mellitus without complications: Secondary | ICD-10-CM | POA: Insufficient documentation

## 2014-07-12 DIAGNOSIS — Z8739 Personal history of other diseases of the musculoskeletal system and connective tissue: Secondary | ICD-10-CM | POA: Insufficient documentation

## 2014-07-12 DIAGNOSIS — I2510 Atherosclerotic heart disease of native coronary artery without angina pectoris: Secondary | ICD-10-CM | POA: Insufficient documentation

## 2014-07-12 DIAGNOSIS — Z794 Long term (current) use of insulin: Secondary | ICD-10-CM | POA: Insufficient documentation

## 2014-07-12 DIAGNOSIS — I129 Hypertensive chronic kidney disease with stage 1 through stage 4 chronic kidney disease, or unspecified chronic kidney disease: Secondary | ICD-10-CM | POA: Diagnosis not present

## 2014-07-12 DIAGNOSIS — Z8551 Personal history of malignant neoplasm of bladder: Secondary | ICD-10-CM | POA: Insufficient documentation

## 2014-07-12 DIAGNOSIS — R6883 Chills (without fever): Secondary | ICD-10-CM | POA: Insufficient documentation

## 2014-07-12 DIAGNOSIS — R55 Syncope and collapse: Secondary | ICD-10-CM | POA: Diagnosis not present

## 2014-07-12 DIAGNOSIS — D509 Iron deficiency anemia, unspecified: Secondary | ICD-10-CM | POA: Diagnosis not present

## 2014-07-12 DIAGNOSIS — Z9889 Other specified postprocedural states: Secondary | ICD-10-CM | POA: Insufficient documentation

## 2014-07-12 DIAGNOSIS — N2581 Secondary hyperparathyroidism of renal origin: Secondary | ICD-10-CM | POA: Diagnosis not present

## 2014-07-12 DIAGNOSIS — N183 Chronic kidney disease, stage 3 (moderate): Secondary | ICD-10-CM | POA: Insufficient documentation

## 2014-07-12 DIAGNOSIS — N186 End stage renal disease: Secondary | ICD-10-CM | POA: Diagnosis not present

## 2014-07-12 DIAGNOSIS — Z992 Dependence on renal dialysis: Secondary | ICD-10-CM | POA: Diagnosis not present

## 2014-07-12 DIAGNOSIS — D631 Anemia in chronic kidney disease: Secondary | ICD-10-CM | POA: Diagnosis not present

## 2014-07-12 LAB — BASIC METABOLIC PANEL
ANION GAP: 15 (ref 5–15)
BUN: 57 mg/dL — ABNORMAL HIGH (ref 6–23)
CHLORIDE: 98 mmol/L (ref 96–112)
CO2: 24 mmol/L (ref 19–32)
CREATININE: 6.78 mg/dL — AB (ref 0.50–1.35)
Calcium: 8.5 mg/dL (ref 8.4–10.5)
GFR calc Af Amer: 8 mL/min — ABNORMAL LOW (ref >90)
GFR calc non Af Amer: 7 mL/min — ABNORMAL LOW (ref >90)
Glucose, Bld: 198 mg/dL — ABNORMAL HIGH (ref 70–99)
Potassium: 3.7 mmol/L (ref 3.5–5.1)
Sodium: 137 mmol/L (ref 135–145)

## 2014-07-12 LAB — CBC WITH DIFFERENTIAL/PLATELET
Basophils Absolute: 0 10*3/uL (ref 0.0–0.1)
Basophils Relative: 0 % (ref 0–1)
EOS ABS: 0.3 10*3/uL (ref 0.0–0.7)
Eosinophils Relative: 4 % (ref 0–5)
HCT: 35.9 % — ABNORMAL LOW (ref 39.0–52.0)
Hemoglobin: 11.6 g/dL — ABNORMAL LOW (ref 13.0–17.0)
Lymphocytes Relative: 14 % (ref 12–46)
Lymphs Abs: 1.1 10*3/uL (ref 0.7–4.0)
MCH: 30.1 pg (ref 26.0–34.0)
MCHC: 32.3 g/dL (ref 30.0–36.0)
MCV: 93 fL (ref 78.0–100.0)
MONOS PCT: 8 % (ref 3–12)
Monocytes Absolute: 0.6 10*3/uL (ref 0.1–1.0)
Neutro Abs: 5.7 10*3/uL (ref 1.7–7.7)
Neutrophils Relative %: 74 % (ref 43–77)
PLATELETS: 233 10*3/uL (ref 150–400)
RBC: 3.86 MIL/uL — ABNORMAL LOW (ref 4.22–5.81)
RDW: 15.1 % (ref 11.5–15.5)
WBC: 7.8 10*3/uL (ref 4.0–10.5)

## 2014-07-12 NOTE — ED Notes (Signed)
Pt reports had dialysis sat and tues and passed out both times after coming home.  Pt says went for dialysis today and was told to come here for eval before they would do dialysis.  Pt says feels "fine" right now.

## 2014-07-12 NOTE — ED Provider Notes (Signed)
CSN: [id number]     Arrival date & time 07/12/14  1049 History  This chart was scribed for Ephraim Hamburger, MD by Edison Simon, ED Scribe. This patient was seen in room [facility]08/[facility]08 and the patient's care was started at 11:30 AM.    Chief Complaint  Patient presents with  . Loss of Consciousness    The history is provided by the patient and the spouse. No language interpreter was used.    HPI Comments: Craig Castillo is a 81 y.o. male dialysis patient who presents to the Emergency Department complaining of syncope. He states that 5 days ago, about 1 hour before finishing dialysis, he began having tremors and chills which persisted for 2 hours, then passed out after returning home. 2 days ago, he had dialysis again and began shaking during dialysis and passed out again on returning home. He states that he feels dizzy before passing out but denies headache. He went for dialysis today and was referred to ED for evaluation before receiving dialysis. He denies other syncopal episodes . He states he feels normal right now besides a minor cough that he has had for a few days. He denies fever, chest pain, or SOB.  Past Medical History  Diagnosis Date  . Dyslipidemia   . DJD (degenerative joint disease)   . Hypertension   . Diabetes mellitus   . BPH (benign prostatic hyperplasia)   . CKD (chronic kidney disease) stage 3, GFR 30-59 ml/min   . Coronary artery disease     a. 1997 s/p MI and prior RCA stenting;  b. 04/2013 MV: inflat ischemia;  c. 05/2013 Cath/PCI: LM 10-20, LAD 30-40p, LCX 95p (3.0x15 Xience DES), 57m, OM1/2 30, RCA 50p diff ISR, 90d (med Rx).  . Bladder cancer   . Stroke    Past Surgical History  Procedure Laterality Date  . Cholecystectomy    . Esophagogastroduodenoscopy  06/08/2002    Soft stricture of the GE  junction with erosive esophagitis. The stricture was dilated to 43 [place] using a Maloney dilator. Swollen fold at GE junction on the gastric site which was biopsied for  histology and was suspicious for sentinel folds.  . Colonoscopy  06/08/2002    Small polyp was oblated via cold biopsy from the cecum and two were snared, one was at the rectosigmoid junction measuring about a centimeter, another one at the rectum which was smaller.  . Transurethral resection of bladder tumor      x7  . Coronary angioplasty  1997    3 stents  . Transurethral resection of bladder tumor  03/15/2012    Procedure: TRANSURETHRAL RESECTION OF BLADDER TUMOR (TURBT);  Surgeon: Marissa Nestle, MD;  Location: [facility];  Service: Urology;  Laterality: N/A;  . Cataract extraction w/phaco  06/20/2012    Procedure: CATARACT EXTRACTION PHACO AND INTRAOCULAR LENS PLACEMENT (IOC);  Surgeon: Tonny Branch, MD;  Location: [facility];  Service: Ophthalmology;  Laterality: Right;  CDE=16.59  . Cataract extraction w/phaco Left 07/04/2012    Procedure: CATARACT EXTRACTION PHACO AND INTRAOCULAR LENS PLACEMENT (IOC);  Surgeon: Tonny Branch, MD;  Location: [facility];  Service: Ophthalmology;  Laterality: Left;  CDE: 24.33  . Transurethral resection of bladder tumor N/A 09/29/2012    Procedure: TRANSURETHRAL RESECTION OF BLADDER TUMOR (TURBT);  Surgeon: Marissa Nestle, MD;  Location: [facility];  Service: Urology;  Laterality: N/A;  . Multiple tooth extractions    . Cystoscopy with biopsy N/A 04/27/2013    Procedure: CYSTOSCOPY WITH  BLADDER BIOPSY & FULGURATION;  Surgeon: Marissa Nestle, MD;  Location: [facility];  Service: Urology;  Laterality: N/A;  . Left heart catheterization with coronary angiogram N/A 06/09/2013    Procedure: LEFT HEART CATHETERIZATION WITH CORONARY ANGIOGRAM;  Surgeon: Peter M Martinique, MD;  Location[facility] C[facility] LAB;  Service: Cardiovascular;  Laterality: N/A;  . Percutaneous coronary rotoblator intervention (pci-r) N/A 06/12/2013    Procedure: PERCUTANEOUS CORONARY ROTOBLATOR INTERVENTION (PCI-R);  Surgeon: Blane Ohara, MD;  Location: [facility] CATH LAB;  Service: Cardiovascular;  Laterality: N/A;    Family History  Problem Relation Age of Onset  . Stroke Mother 60  . Diabetes Brother 24   History  Substance Use Topics  . Smoking status: Former Smoker -- 0.50 packs/day for 20 years    Types: Cigarettes    Quit date: 03/08/1981  . Smokeless tobacco: Never Used  . Alcohol Use: No    Review of Systems  Constitutional: Positive for chills. Negative for fever.  Respiratory: Positive for cough. Negative for shortness of breath.   Cardiovascular: Negative for chest pain.  Gastrointestinal: Negative for vomiting, abdominal pain and diarrhea.  Neurological: Positive for dizziness and syncope. Negative for weakness and headaches.  All other systems reviewed and are negative.     Allergies  Aspirin; Celecoxib; Hydrocodone-acetaminophen; Morphine; Niacin; and Piroxicam  Home Medications   Prior to Admission medications   Medication Sig Start Date End Date Taking? Authorizing Provider  amLODipine (NORVASC) 10 MG tablet Take 10 mg by mouth daily.    Historical Provider, MD  aspirin EC 81 MG tablet Take 81 mg by mouth daily.    Historical Provider, MD  calcitRIOL (ROCALTROL) 0.25 MCG capsule Take 1 capsule (0.25 mcg total) by mouth daily. 06/14/13   Coral Spikes, DO  ezetimibe (ZETIA) 10 MG tablet Take 10 mg by mouth every morning.     Historical Provider, MD  furosemide (LASIX) 40 MG tablet Take 1 tablet by mouth daily. 01/17/14   Historical Provider, MD  gemfibrozil (LOPID) 600 MG tablet Take 600 mg by mouth 2 (two) times daily.     Historical Provider, MD  hydrochlorothiazide (HYDRODIURIL) 25 MG tablet Take 1 tablet by mouth daily. 01/23/14   Historical Provider, MD  insulin NPH (HUMULIN N,NOVOLIN N) 100 UNIT/ML injection Inject 15-20 Units into the skin 2 (two) times daily. Take 20 units SQ QAM and 15 units SQ QPM 04/28/13   Kathie Dike, MD  isosorbide mononitrate (IMDUR) 60 MG 24 hr tablet Take 1 tablet (60 mg total) by mouth daily. 12/12/13   Arnoldo Lenis, MD  metoprolol  tartrate (LOPRESSOR) 25 MG tablet TAKE 1/2 TABLET BY MOUTH TWICE DAILY. 05/22/14   Arnoldo Lenis, MD  nitroGLYCERIN (NITROSTAT) 0.4 MG SL tablet Place 1 tablet (0.4 mg total) under the tongue every 5 (five) minutes x 3 doses as needed for chest pain. Chest Pain 02/19/14   Arnoldo Lenis, MD  omeprazole (PRILOSEC) 20 MG capsule Take 20 mg by mouth every morning.     Historical Provider, MD  tamsulosin (FLOMAX) 0.4 MG CAPS capsule Take 0.4 mg by mouth daily.    Historical Provider, MD   BP 157/79 mmHg  Pulse 82  Temp(Src) 97.9 F (36.6 C) (Oral)  Resp 18  Ht 5\' 8"  (1.727 m)  Wt 210 lb (95.255 kg)  BMI 31.94 kg/m2  SpO2 100% Physical Exam  Constitutional: He is oriented to person, place, and time. He appears well-developed and well-nourished.  HENT:  Head: Normocephalic  and atraumatic.  Right Ear: External ear normal.  Left Ear: External ear normal.  Nose: Nose normal.  Mouth/Throat: Oropharynx is clear and moist.  Eyes: Pupils are equal, round, and reactive to light.  Neck: Normal range of motion. Neck supple.  Cardiovascular: Normal rate, regular rhythm and normal heart sounds.   No murmur heard. Pulmonary/Chest: Effort normal and breath sounds normal. No respiratory distress. He has no wheezes. He has no rales.  Abdominal: He exhibits no distension. There is no tenderness.  Musculoskeletal: He exhibits no edema.  Neurological: He is alert and oriented to person, place, and time.  Cranial nerves 2-12 grossly intact 5/5 strength in all 4 extremities  Skin: Skin is warm and dry.  Psychiatric: He has a normal mood and affect.  Nursing note and vitals reviewed.   ED Course  Procedures (including critical care time)  DIAGNOSTIC STUDIES: Oxygen Saturation is 100% on room air, normal by my interpretation.    COORDINATION OF CARE: 11:30 AM Discussed treatment plan with patient at beside, the patient agrees with the plan and has no further questions at this time.   Labs  Review Labs Reviewed  CBC WITH DIFFERENTIAL/PLATELET - Abnormal; Notable for the following:    RBC 3.86 (*)    Hemoglobin 11.6 (*)    HCT 35.9 (*)    All other components within normal limits  BASIC METABOLIC PANEL - Abnormal; Notable for the following:    Glucose, Bld 198 (*)    BUN 57 (*)    Creatinine, Ser 6.78 (*)    GFR calc non Af Amer 7 (*)    GFR calc Af Amer 8 (*)    All other components within normal limits    Imaging Review No results found.   EKG Interpretation   Date/Time:  Thursday July 12 2014 11:00:35 EST Ventricular Rate:  80 PR Interval:  196 QRS Duration: 140 QT Interval:  420 QTC Calculation: 484 R Axis:   4 Text Interpretation:  Normal sinus rhythm Right bundle branch block T wave  abnormality, consider inferior ischemia Abnormal ECG no significant change  since Oct 2015 Confirmed by Regenia Skeeter  MD, Ata Pecha ([id number]) on 07/12/2014  11:11:05 AM      MDM   Final diagnoses:  Syncope, unspecified syncope type    Patient's syncope/chills seems to be dialysis related. In between dialysis sessions he has no symptoms. Likely related to hypoglycemia vs too much fluid being taken off. He does not know his dry weight so difficult to interpret if they're taking off too much. Workup here shows no acute other reason for syncope/lightheadedness, thus will refer back to dialysis and suggest careful watching while in dialysis and CBG when symptoms occur.  I personally performed the services described in this documentation, which was scribed in my presence. The recorded information has been reviewed and is accurate.   Ephraim Hamburger, MD 07/12/14 [phone]

## 2014-07-14 DIAGNOSIS — N2581 Secondary hyperparathyroidism of renal origin: Secondary | ICD-10-CM | POA: Diagnosis not present

## 2014-07-14 DIAGNOSIS — Z992 Dependence on renal dialysis: Secondary | ICD-10-CM | POA: Diagnosis not present

## 2014-07-14 DIAGNOSIS — Z23 Encounter for immunization: Secondary | ICD-10-CM | POA: Diagnosis not present

## 2014-07-14 DIAGNOSIS — N186 End stage renal disease: Secondary | ICD-10-CM | POA: Diagnosis not present

## 2014-07-14 DIAGNOSIS — D631 Anemia in chronic kidney disease: Secondary | ICD-10-CM | POA: Diagnosis not present

## 2014-07-14 DIAGNOSIS — R6883 Chills (without fever): Secondary | ICD-10-CM | POA: Diagnosis not present

## 2014-07-14 DIAGNOSIS — D509 Iron deficiency anemia, unspecified: Secondary | ICD-10-CM | POA: Diagnosis not present

## 2014-07-17 DIAGNOSIS — D631 Anemia in chronic kidney disease: Secondary | ICD-10-CM | POA: Diagnosis not present

## 2014-07-17 DIAGNOSIS — D509 Iron deficiency anemia, unspecified: Secondary | ICD-10-CM | POA: Diagnosis not present

## 2014-07-17 DIAGNOSIS — Z23 Encounter for immunization: Secondary | ICD-10-CM | POA: Diagnosis not present

## 2014-07-17 DIAGNOSIS — N2581 Secondary hyperparathyroidism of renal origin: Secondary | ICD-10-CM | POA: Diagnosis not present

## 2014-07-17 DIAGNOSIS — N186 End stage renal disease: Secondary | ICD-10-CM | POA: Diagnosis not present

## 2014-07-17 DIAGNOSIS — Z992 Dependence on renal dialysis: Secondary | ICD-10-CM | POA: Diagnosis not present

## 2014-07-17 DIAGNOSIS — R6883 Chills (without fever): Secondary | ICD-10-CM | POA: Diagnosis not present

## 2014-07-19 ENCOUNTER — Encounter: Payer: Self-pay | Admitting: Surgery

## 2014-07-19 DIAGNOSIS — D509 Iron deficiency anemia, unspecified: Secondary | ICD-10-CM | POA: Diagnosis not present

## 2014-07-19 DIAGNOSIS — Z992 Dependence on renal dialysis: Secondary | ICD-10-CM | POA: Diagnosis not present

## 2014-07-19 DIAGNOSIS — D631 Anemia in chronic kidney disease: Secondary | ICD-10-CM | POA: Diagnosis not present

## 2014-07-19 DIAGNOSIS — Z23 Encounter for immunization: Secondary | ICD-10-CM | POA: Diagnosis not present

## 2014-07-19 DIAGNOSIS — R6883 Chills (without fever): Secondary | ICD-10-CM | POA: Diagnosis not present

## 2014-07-19 DIAGNOSIS — N186 End stage renal disease: Secondary | ICD-10-CM | POA: Diagnosis not present

## 2014-07-19 DIAGNOSIS — N2581 Secondary hyperparathyroidism of renal origin: Secondary | ICD-10-CM | POA: Diagnosis not present

## 2014-07-20 ENCOUNTER — Other Ambulatory Visit (HOSPITAL_COMMUNITY): Payer: Medicaid Other

## 2014-07-20 ENCOUNTER — Ambulatory Visit: Payer: Medicaid Other | Admitting: Surgery

## 2014-07-20 ENCOUNTER — Encounter (HOSPITAL_COMMUNITY): Payer: Medicaid Other

## 2014-07-21 ENCOUNTER — Inpatient Hospital Stay (HOSPITAL_COMMUNITY)
Admission: EM | Admit: 2014-07-21 | Discharge: 2014-07-23 | DRG: 698 | Disposition: A | Discharge status: Home / Self Care | Payer: Medicare Other | Attending: Internal Medicine | Admitting: Internal Medicine

## 2014-07-21 ENCOUNTER — Other Ambulatory Visit (HOSPITAL_COMMUNITY): Payer: Self-pay

## 2014-07-21 ENCOUNTER — Encounter (HOSPITAL_COMMUNITY): Payer: Self-pay

## 2014-07-21 ENCOUNTER — Emergency Department (HOSPITAL_COMMUNITY): Payer: Medicare Other

## 2014-07-21 DIAGNOSIS — Z7902 Long term (current) use of antithrombotics/antiplatelets: Secondary | ICD-10-CM | POA: Diagnosis not present

## 2014-07-21 DIAGNOSIS — I10 Essential (primary) hypertension: Secondary | ICD-10-CM | POA: Diagnosis not present

## 2014-07-21 DIAGNOSIS — R069 Unspecified abnormalities of breathing: Secondary | ICD-10-CM | POA: Diagnosis not present

## 2014-07-21 DIAGNOSIS — E1122 Type 2 diabetes mellitus with diabetic chronic kidney disease: Secondary | ICD-10-CM | POA: Diagnosis present

## 2014-07-21 DIAGNOSIS — K219 Gastro-esophageal reflux disease without esophagitis: Secondary | ICD-10-CM | POA: Diagnosis not present

## 2014-07-21 DIAGNOSIS — Z833 Family history of diabetes mellitus: Secondary | ICD-10-CM

## 2014-07-21 DIAGNOSIS — T8351XA Infection and inflammatory reaction due to indwelling urinary catheter, initial encounter: Principal | ICD-10-CM | POA: Diagnosis present

## 2014-07-21 DIAGNOSIS — R000 Tachycardia, unspecified: Secondary | ICD-10-CM

## 2014-07-21 DIAGNOSIS — Z87891 Personal history of nicotine dependence: Secondary | ICD-10-CM | POA: Diagnosis not present

## 2014-07-21 DIAGNOSIS — I120 Hypertensive chronic kidney disease with stage 5 chronic kidney disease or end stage renal disease: Secondary | ICD-10-CM | POA: Diagnosis not present

## 2014-07-21 DIAGNOSIS — N2581 Secondary hyperparathyroidism of renal origin: Secondary | ICD-10-CM | POA: Diagnosis not present

## 2014-07-21 DIAGNOSIS — Z955 Presence of coronary angioplasty implant and graft: Secondary | ICD-10-CM

## 2014-07-21 DIAGNOSIS — Z23 Encounter for immunization: Secondary | ICD-10-CM | POA: Diagnosis not present

## 2014-07-21 DIAGNOSIS — Y846 Urinary catheterization as the cause of abnormal reaction of the patient, or of later complication, without mention of misadventure at the time of the procedure: Secondary | ICD-10-CM | POA: Diagnosis present

## 2014-07-21 DIAGNOSIS — Z8673 Personal history of transient ischemic attack (TIA), and cerebral infarction without residual deficits: Secondary | ICD-10-CM

## 2014-07-21 DIAGNOSIS — Z823 Family history of stroke: Secondary | ICD-10-CM

## 2014-07-21 DIAGNOSIS — I2510 Atherosclerotic heart disease of native coronary artery without angina pectoris: Secondary | ICD-10-CM | POA: Diagnosis not present

## 2014-07-21 DIAGNOSIS — Z8551 Personal history of malignant neoplasm of bladder: Secondary | ICD-10-CM

## 2014-07-21 DIAGNOSIS — I951 Orthostatic hypotension: Secondary | ICD-10-CM | POA: Diagnosis present

## 2014-07-21 DIAGNOSIS — N186 End stage renal disease: Secondary | ICD-10-CM | POA: Diagnosis present

## 2014-07-21 DIAGNOSIS — R531 Weakness: Secondary | ICD-10-CM

## 2014-07-21 DIAGNOSIS — D631 Anemia in chronic kidney disease: Secondary | ICD-10-CM | POA: Diagnosis not present

## 2014-07-21 DIAGNOSIS — R6883 Chills (without fever): Secondary | ICD-10-CM | POA: Diagnosis not present

## 2014-07-21 DIAGNOSIS — R55 Syncope and collapse: Secondary | ICD-10-CM

## 2014-07-21 DIAGNOSIS — I252 Old myocardial infarction: Secondary | ICD-10-CM

## 2014-07-21 DIAGNOSIS — D649 Anemia, unspecified: Secondary | ICD-10-CM | POA: Diagnosis present

## 2014-07-21 DIAGNOSIS — J9811 Atelectasis: Secondary | ICD-10-CM | POA: Diagnosis not present

## 2014-07-21 DIAGNOSIS — A419 Sepsis, unspecified organism: Secondary | ICD-10-CM | POA: Diagnosis not present

## 2014-07-21 DIAGNOSIS — Z7982 Long term (current) use of aspirin: Secondary | ICD-10-CM | POA: Diagnosis not present

## 2014-07-21 DIAGNOSIS — Z992 Dependence on renal dialysis: Secondary | ICD-10-CM | POA: Diagnosis not present

## 2014-07-21 DIAGNOSIS — E1129 Type 2 diabetes mellitus with other diabetic kidney complication: Secondary | ICD-10-CM | POA: Diagnosis present

## 2014-07-21 DIAGNOSIS — E785 Hyperlipidemia, unspecified: Secondary | ICD-10-CM | POA: Diagnosis not present

## 2014-07-21 DIAGNOSIS — N189 Chronic kidney disease, unspecified: Secondary | ICD-10-CM

## 2014-07-21 DIAGNOSIS — M1990 Unspecified osteoarthritis, unspecified site: Secondary | ICD-10-CM | POA: Diagnosis not present

## 2014-07-21 DIAGNOSIS — D509 Iron deficiency anemia, unspecified: Secondary | ICD-10-CM | POA: Diagnosis not present

## 2014-07-21 DIAGNOSIS — N390 Urinary tract infection, site not specified: Secondary | ICD-10-CM | POA: Diagnosis present

## 2014-07-21 LAB — COMPREHENSIVE METABOLIC PANEL
ALT: 5 U/L (ref 0–53)
AST: 13 U/L (ref 0–37)
Albumin: 3.1 g/dL — ABNORMAL LOW (ref 3.5–5.2)
Alkaline Phosphatase: 78 U/L (ref 39–117)
Anion gap: 13 (ref 5–15)
BUN: 39 mg/dL — ABNORMAL HIGH (ref 6–23)
CALCIUM: 8.7 mg/dL (ref 8.4–10.5)
CO2: 27 mmol/L (ref 19–32)
Chloride: 96 mmol/L (ref 96–112)
Creatinine, Ser: 5.88 mg/dL — ABNORMAL HIGH (ref 0.50–1.35)
GFR calc Af Amer: 9 mL/min — ABNORMAL LOW (ref >90)
GFR calc non Af Amer: 8 mL/min — ABNORMAL LOW (ref >90)
Glucose, Bld: 163 mg/dL — ABNORMAL HIGH (ref 70–99)
POTASSIUM: 3.3 mmol/L — AB (ref 3.5–5.1)
Sodium: 136 mmol/L (ref 135–145)
Total Bilirubin: 0.8 mg/dL (ref 0.3–1.2)
Total Protein: 7.4 g/dL (ref 6.0–8.3)

## 2014-07-21 LAB — APTT: aPTT: 44 seconds — ABNORMAL HIGH (ref 24–37)

## 2014-07-21 LAB — PROTIME-INR
INR: 1.18 (ref 0.00–1.49)
PROTHROMBIN TIME: 15.1 s (ref 11.6–15.2)

## 2014-07-21 LAB — CBC WITH DIFFERENTIAL/PLATELET
BASOS ABS: 0 10*3/uL (ref 0.0–0.1)
Basophils Relative: 0 % (ref 0–1)
Eosinophils Absolute: 0.1 10*3/uL (ref 0.0–0.7)
Eosinophils Relative: 1 % (ref 0–5)
HCT: 31.1 % — ABNORMAL LOW (ref 39.0–52.0)
HEMOGLOBIN: 10.1 g/dL — AB (ref 13.0–17.0)
LYMPHS ABS: 0.4 10*3/uL — AB (ref 0.7–4.0)
Lymphocytes Relative: 4 % — ABNORMAL LOW (ref 12–46)
MCH: 30.1 pg (ref 26.0–34.0)
MCHC: 32.5 g/dL (ref 30.0–36.0)
MCV: 92.8 fL (ref 78.0–100.0)
MONOS PCT: 2 % — AB (ref 3–12)
Monocytes Absolute: 0.2 10*3/uL (ref 0.1–1.0)
Neutro Abs: 10.9 10*3/uL — ABNORMAL HIGH (ref 1.7–7.7)
Neutrophils Relative %: 93 % — ABNORMAL HIGH (ref 43–77)
Platelets: 216 10*3/uL (ref 150–400)
RBC: 3.35 MIL/uL — ABNORMAL LOW (ref 4.22–5.81)
RDW: 14.4 % (ref 11.5–15.5)
WBC: 11.6 10*3/uL — ABNORMAL HIGH (ref 4.0–10.5)

## 2014-07-21 LAB — TROPONIN I
Troponin I: 0.03 ng/mL (ref <0.031)
Troponin I: 0.06 ng/mL — ABNORMAL HIGH (ref <0.031)

## 2014-07-21 LAB — BRAIN NATRIURETIC PEPTIDE: B Natriuretic Peptide: 142 pg/mL — ABNORMAL HIGH (ref 0.0–100.0)

## 2014-07-21 LAB — GLUCOSE, CAPILLARY
GLUCOSE-CAPILLARY: 264 mg/dL — AB (ref 70–99)
Glucose-Capillary: 155 mg/dL — ABNORMAL HIGH (ref 70–99)

## 2014-07-21 LAB — MRSA PCR SCREENING: MRSA BY PCR: NEGATIVE

## 2014-07-21 LAB — FIBRINOGEN: Fibrinogen: 740 mg/dL — ABNORMAL HIGH (ref 204–475)

## 2014-07-21 LAB — I-STAT CG4 LACTIC ACID, ED: Lactic Acid, Venous: 1.88 mmol/L (ref 0.5–2.0)

## 2014-07-21 MED ORDER — SODIUM CHLORIDE 0.9 % IV BOLUS (SEPSIS)
500.0000 mL | Freq: Once | INTRAVENOUS | Status: AC
  Administered 2014-07-21: 500 mL via INTRAVENOUS

## 2014-07-21 MED ORDER — ACETAMINOPHEN 325 MG PO TABS: 650.0000 mg | ORAL_TABLET | Freq: Four times a day (QID) | ORAL | Status: DC | PRN

## 2014-07-21 MED ORDER — TAMSULOSIN HCL 0.4 MG PO CAPS
0.4000 mg | ORAL_CAPSULE | Freq: Every day | ORAL | Status: DC
  Administered 2014-07-21 – 2014-07-23 (×3): 0.4 mg via ORAL
  Filled 2014-07-21 (×3): qty 1

## 2014-07-21 MED ORDER — VANCOMYCIN HCL 10 G IV SOLR
1500.0000 mg | Freq: Once | INTRAVENOUS | Status: AC
  Administered 2014-07-21: 1500 mg via INTRAVENOUS
  Filled 2014-07-21: qty 1500

## 2014-07-21 MED ORDER — VANCOMYCIN HCL IN DEXTROSE 1-5 GM/200ML-% IV SOLN: 1000.0000 mg | Freq: Once | INTRAVENOUS | Status: DC

## 2014-07-21 MED ORDER — ACETAMINOPHEN 650 MG RE SUPP: 650.0000 mg | Freq: Four times a day (QID) | RECTAL | Status: DC | PRN

## 2014-07-21 MED ORDER — EZETIMIBE 10 MG PO TABS
10.0000 mg | ORAL_TABLET | Freq: Every morning | ORAL | Status: DC
  Administered 2014-07-22 – 2014-07-23 (×2): 10 mg via ORAL
  Filled 2014-07-21 (×4): qty 1

## 2014-07-21 MED ORDER — OXYCODONE HCL 5 MG PO TABS: 5.0000 mg | ORAL_TABLET | ORAL | Status: DC | PRN

## 2014-07-21 MED ORDER — ATORVASTATIN CALCIUM 20 MG PO TABS
20.0000 mg | ORAL_TABLET | Freq: Every day | ORAL | Status: DC
  Administered 2014-07-22 – 2014-07-23 (×2): 20 mg via ORAL
  Filled 2014-07-21 (×4): qty 1

## 2014-07-21 MED ORDER — ASPIRIN EC 81 MG PO TBEC
81.0000 mg | DELAYED_RELEASE_TABLET | Freq: Every day | ORAL | Status: DC
  Administered 2014-07-22 – 2014-07-23 (×2): 81 mg via ORAL
  Filled 2014-07-21 (×2): qty 1

## 2014-07-21 MED ORDER — PIPERACILLIN-TAZOBACTAM IN DEX 2-0.25 GM/50ML IV SOLN
INTRAVENOUS | Status: AC
  Filled 2014-07-21: qty 100

## 2014-07-21 MED ORDER — PANTOPRAZOLE SODIUM 40 MG PO TBEC
40.0000 mg | DELAYED_RELEASE_TABLET | Freq: Every day | ORAL | Status: DC
  Administered 2014-07-22 – 2014-07-23 (×2): 40 mg via ORAL
  Filled 2014-07-21 (×2): qty 1

## 2014-07-21 MED ORDER — INSULIN ASPART 100 UNIT/ML ~~LOC~~ SOLN
0.0000 [IU] | Freq: Three times a day (TID) | SUBCUTANEOUS | Status: DC
  Administered 2014-07-22 (×3): 2 [IU] via SUBCUTANEOUS
  Administered 2014-07-23: 1 [IU] via SUBCUTANEOUS

## 2014-07-21 MED ORDER — CLOPIDOGREL BISULFATE 75 MG PO TABS
75.0000 mg | ORAL_TABLET | Freq: Every day | ORAL | Status: DC
  Administered 2014-07-22 – 2014-07-23 (×2): 75 mg via ORAL
  Filled 2014-07-21 (×2): qty 1

## 2014-07-21 MED ORDER — PIPERACILLIN-TAZOBACTAM 3.375 G IVPB 30 MIN: 3.3750 g | Freq: Once | INTRAVENOUS | Status: DC

## 2014-07-21 MED ORDER — SODIUM CHLORIDE 0.9 % IV SOLN
INTRAVENOUS | Status: DC
Start: 2014-07-21 — End: 2014-07-22
  Administered 2014-07-21: 19:00:00 via INTRAVENOUS

## 2014-07-21 MED ORDER — SODIUM CHLORIDE 0.9 % IJ SOLN
3.0000 mL | Freq: Two times a day (BID) | INTRAMUSCULAR | Status: DC
  Administered 2014-07-21 – 2014-07-23 (×3): 3 mL via INTRAVENOUS

## 2014-07-21 MED ORDER — HYDRALAZINE HCL 20 MG/ML IJ SOLN: 5.0000 mg | INTRAMUSCULAR | Status: DC | PRN

## 2014-07-21 MED ORDER — PIPERACILLIN-TAZOBACTAM IN DEX 2-0.25 GM/50ML IV SOLN
2.2500 g | Freq: Three times a day (TID) | INTRAVENOUS | Status: DC
  Administered 2014-07-21 – 2014-07-22 (×3): 2.25 g via INTRAVENOUS
  Filled 2014-07-21 (×6): qty 50

## 2014-07-21 MED ORDER — HEPARIN SODIUM (PORCINE) 5000 UNIT/ML IJ SOLN
5000.0000 [IU] | Freq: Three times a day (TID) | INTRAMUSCULAR | Status: DC
  Administered 2014-07-21 – 2014-07-23 (×5): 5000 [IU] via SUBCUTANEOUS
  Filled 2014-07-21 (×6): qty 1

## 2014-07-21 NOTE — Progress Notes (Signed)
Pt transferred to unit from ED. Pt is alert and oriented with no complaints at this time. VS are stable with HR of 109, BP 126/62, SPO2 96% on RA, and RR of 22. Will continue to monitor.

## 2014-07-21 NOTE — ED Notes (Signed)
Dialysis treatment was not finished per pt. , completed 1.5 hours.

## 2014-07-21 NOTE — ED Notes (Signed)
Pt states he went to dialysis today. States he had a headache before going. States one hour into his treatment he started shaking. Denies other symptoms. Pt states the same thing happened to him about two weeks ago

## 2014-07-21 NOTE — ED Provider Notes (Signed)
CSN: [id number]     Arrival date & time 07/21/14  1248 History  This chart was scribed for Ezequiel Essex, MD by Molli Posey, ED Scribe. This patient was seen in room APA11/AP[facility]11 and the patient's care was started 1:32 PM.    Chief Complaint  Patient presents with  . Tremors   HPI HPI Comments: Craig CHAVARIN is a 81 y.o. male with a history of HTN, DM, CKD, CAD, bladder CA and stroke who presents to the Emergency Department complaining of tremors that occurred PTA when pt was receiving dialysis treatment. He states that he started shaking, felt dizzy and that the room started spinning during the onset of his symptoms. He states that he is not dizzy currently. Pt reports similar prior episodes with the most recent being 2 weeks ago. He was told that it was due to a drop in his blood sugar. Pt states he has not missed any sessions recently and started dialysis 2 months ago. Pt denies any history of COPD or asthma. Pt denies CP, syncope, abdominal pain, fevers and any pain.    Past Medical History  Diagnosis Date  . Dyslipidemia   . DJD (degenerative joint disease)   . Hypertension   . Diabetes mellitus   . BPH (benign prostatic hyperplasia)   . CKD (chronic kidney disease) stage 3, GFR 30-59 ml/min   . Coronary artery disease     a. 1997 s/p MI and prior RCA stenting;  b. 04/2013 MV: inflat ischemia;  c. 05/2013 Cath/PCI: LM 10-20, LAD 30-40p, LCX 95p (3.0x15 Xience DES), 58m, OM1/2 30, RCA 50p diff ISR, 90d (med Rx).  . Bladder cancer   . Stroke    Past Surgical History  Procedure Laterality Date  . Cholecystectomy    . Esophagogastroduodenoscopy  06/08/2002    Soft stricture of the GE  junction with erosive esophagitis. The stricture was dilated to 70 [place] using a Maloney dilator. Swollen fold at GE junction on the gastric site which was biopsied for histology and was suspicious for sentinel folds.  . Colonoscopy  06/08/2002    Small polyp was oblated via cold biopsy from the  cecum and two were snared, one was at the rectosigmoid junction measuring about a centimeter, another one at the rectum which was smaller.  . Transurethral resection of bladder tumor      x7  . Coronary angioplasty  1997    3 stents  . Transurethral resection of bladder tumor  03/15/2012    Procedure: TRANSURETHRAL RESECTION OF BLADDER TUMOR (TURBT);  Surgeon: Marissa Nestle, MD;  Location: [facility];  Service: Urology;  Laterality: N/A;  . Cataract extraction w/phaco  06/20/2012    Procedure: CATARACT EXTRACTION PHACO AND INTRAOCULAR LENS PLACEMENT (IOC);  Surgeon: Tonny Branch, MD;  Location: [facility];  Service: Ophthalmology;  Laterality: Right;  CDE=16.59  . Cataract extraction w/phaco Left 07/04/2012    Procedure: CATARACT EXTRACTION PHACO AND INTRAOCULAR LENS PLACEMENT (IOC);  Surgeon: Tonny Branch, MD;  Location: [facility];  Service: Ophthalmology;  Laterality: Left;  CDE: 24.33  . Transurethral resection of bladder tumor N/A 09/29/2012    Procedure: TRANSURETHRAL RESECTION OF BLADDER TUMOR (TURBT);  Surgeon: Marissa Nestle, MD;  Location: [facility];  Service: Urology;  Laterality: N/A;  . Multiple tooth extractions    . Cystoscopy with biopsy N/A 04/27/2013    Procedure: CYSTOSCOPY WITH BLADDER BIOPSY & FULGURATION;  Surgeon: Marissa Nestle, MD;  Location: [facility];  Service: Urology;  Laterality: N/A;  .  Left heart catheterization with coronary angiogram N/A 06/09/2013    Procedure: LEFT HEART CATHETERIZATION WITH CORONARY ANGIOGRAM;  Surgeon: Peter M Martinique, MD;  Location: [facility] CATH LAB;  Service: Cardiovascular;  Laterality: N/A;  . Percutaneous coronary rotoblator intervention (pci-r) N/A 06/12/2013    Procedure: PERCUTANEOUS CORONARY ROTOBLATOR INTERVENTION (PCI-R);  Surgeon: Blane Ohara, MD;  Location: [facility] CATH LAB;  Service: Cardiovascular;  Laterality: N/A;   Family History  Problem Relation Age of Onset  . Stroke Mother 26  . Diabetes Brother 58   History  Substance Use Topics  .  Smoking status: Former Smoker -- 0.50 packs/day for 20 years    Types: Cigarettes    Quit date: 03/08/1981  . Smokeless tobacco: Never Used  . Alcohol Use: No    Review of Systems  Constitutional: Negative for fever.  Cardiovascular: Negative for chest pain.  Gastrointestinal: Negative for abdominal pain.  Neurological: Positive for dizziness. Negative for syncope.  All other systems reviewed and are negative.  Allergies  Aspirin; Celecoxib; Hydrocodone-acetaminophen; Lasix; Morphine; Niacin; and Piroxicam  Home Medications   Prior to Admission medications   Medication Sig Start Date End Date Taking? Authorizing Provider  amLODipine (NORVASC) 5 MG tablet Take 5 mg by mouth daily.   Yes Historical Provider, MD  aspirin EC 81 MG tablet Take 81 mg by mouth daily.   Yes Historical Provider, MD  atorvastatin (LIPITOR) 20 MG tablet Take 20 mg by mouth daily.   Yes Historical Provider, MD  clopidogrel (PLAVIX) 75 MG tablet Take 75 mg by mouth daily.   Yes Historical Provider, MD  ezetimibe (ZETIA) 10 MG tablet Take 10 mg by mouth every morning.    Yes Historical Provider, MD  furosemide (LASIX) 40 MG tablet Take 40 mg by mouth 2 (two) times daily.   Yes Historical Provider, MD  gemfibrozil (LOPID) 600 MG tablet Take 600 mg by mouth 2 (two) times daily before a meal.   Yes Historical Provider, MD  hydrochlorothiazide (HYDRODIURIL) 25 MG tablet Take 1 tablet by mouth daily.  01/23/14  Yes Historical Provider, MD  isosorbide mononitrate (IMDUR) 60 MG 24 hr tablet Take 1 tablet (60 mg total) by mouth daily. 12/12/13  Yes Arnoldo Lenis, MD  metoprolol tartrate (LOPRESSOR) 25 MG tablet TAKE 1/2 TABLET BY MOUTH TWICE DAILY. Patient taking differently: Take 12.5 mg by mouth 2 (two) times daily. TAKE 1/2 TABLET BY MOUTH TWICE DAILY. 05/22/14  Yes Arnoldo Lenis, MD  omeprazole (PRILOSEC) 20 MG capsule Take 20 mg by mouth every morning.    Yes Historical Provider, MD  tamsulosin (FLOMAX) 0.4 MG  CAPS capsule Take 0.4 mg by mouth.    Yes Historical Provider, MD  calcitRIOL (ROCALTROL) 0.25 MCG capsule Take 1 capsule (0.25 mcg total) by mouth daily. Patient not taking: Reported on 07/12/2014 06/14/13   Coral Spikes, DO  insulin NPH (HUMULIN N,NOVOLIN N) 100 UNIT/ML injection Inject 15-20 Units into the skin 2 (two) times daily. Take 20 units SQ QAM and 15 units SQ QPM Patient not taking: Reported on 07/12/2014 04/28/13   Kathie Dike, MD  nitroGLYCERIN (NITROSTAT) 0.4 MG SL tablet Place 1 tablet (0.4 mg total) under the tongue every 5 (five) minutes x 3 doses as needed for chest pain. Chest Pain 02/19/14   Arnoldo Lenis, MD   BP 126/62 mmHg  Pulse 114  Temp(Src) 98.7 F (37.1 C) (Oral)  Resp 25  Ht 5\' 8"  (1.727 m)  Wt 210 lb (95.255 kg)  BMI 31.94 kg/m2  SpO2 95% Physical Exam  Constitutional: He is oriented to person, place, and time. He appears well-developed and well-nourished. No distress.  HENT:  Head: Normocephalic and atraumatic.  Mouth/Throat: Oropharynx is clear and moist. No oropharyngeal exudate.  Eyes: Conjunctivae and EOM are normal. Pupils are equal, round, and reactive to light.  Neck: Normal range of motion. Neck supple.  No meningismus.  Cardiovascular: Regular rhythm, normal heart sounds and intact distal pulses.  Tachycardia present.   No murmur heard. Pulmonary/Chest: Effort normal and breath sounds normal. No respiratory distress.  R chest dialysis catheter.  Abdominal: Soft. There is no tenderness. There is no rebound and no guarding.  Genitourinary:  Bilateral nephrostomy tubes in place.   Musculoskeletal: Normal range of motion. He exhibits no edema or tenderness.  Upper extremities tremors at rest.   Neurological: He is alert and oriented to person, place, and time. No cranial nerve deficit. He exhibits normal muscle tone. Coordination normal.  No ataxia on finger to nose bilaterally. No pronator drift. 5/5 strength throughout. CN 2-12 intact. Equal  grip strength. Sensation intact.  Skin: Skin is warm.  Psychiatric: He has a normal mood and affect. His behavior is normal.  Nursing note and vitals reviewed.   ED Course  Procedures   DIAGNOSTIC STUDIES: Oxygen Saturation is 96% on RA, normal by my interpretation.    COORDINATION OF CARE: 1:39 PM Discussed treatment plan with pt at bedside and pt agreed to plan.   Labs Review Labs Reviewed  CBC WITH DIFFERENTIAL/PLATELET - Abnormal; Notable for the following:    WBC 11.6 (*)    RBC 3.35 (*)    Hemoglobin 10.1 (*)    HCT 31.1 (*)    Neutrophils Relative % 93 (*)    Neutro Abs 10.9 (*)    Lymphocytes Relative 4 (*)    Lymphs Abs 0.4 (*)    Monocytes Relative 2 (*)    All other components within normal limits  COMPREHENSIVE METABOLIC PANEL - Abnormal; Notable for the following:    Potassium 3.3 (*)    Glucose, Bld 163 (*)    BUN 39 (*)    Creatinine, Ser 5.88 (*)    Albumin 3.1 (*)    GFR calc non Af Amer 8 (*)    GFR calc Af Amer 9 (*)    All other components within normal limits  BRAIN NATRIURETIC PEPTIDE - Abnormal; Notable for the following:    B Natriuretic Peptide 142.0 (*)    All other components within normal limits  TROPONIN I - Abnormal; Notable for the following:    Troponin I 0.06 (*)    All other components within normal limits  APTT - Abnormal; Notable for the following:    aPTT 44 (*)    All other components within normal limits  FIBRINOGEN - Abnormal; Notable for the following:    Fibrinogen 740 (*)    All other components within normal limits  GLUCOSE, CAPILLARY - Abnormal; Notable for the following:    Glucose-Capillary 155 (*)    All other components within normal limits  CULTURE, BLOOD (ROUTINE X 2)  CULTURE, BLOOD (ROUTINE X 2)  MRSA PCR SCREENING  TROPONIN I  PROTIME-INR  URINALYSIS, ROUTINE W REFLEX MICROSCOPIC  CORTISOL  HEMOGLOBIN A1C  CBC  COMPREHENSIVE METABOLIC PANEL  I-STAT CG4 LACTIC ACID, ED  I-STAT CG4 LACTIC ACID, ED     Imaging Review Dg Chest Portable 1 View  07/21/2014   CLINICAL DATA:  Tremors  EXAM: PORTABLE CHEST - 1 VIEW  COMPARISON:  06/08/2013  FINDINGS: Normal heart size. Low lung volumes. Bibasilar atelectasis. Upper lungs clear. Normal vascularity. Right internal jugular dialysis catheter in place with its tip at the cavoatrial junction.  IMPRESSION: Bibasilar atelectasis.   Electronically Signed   By: Marybelle Killings M.D.   On: 07/21/2014 14:32     EKG Interpretation   Date/Time:  Saturday July 21 2014 12:52:25 EST Ventricular Rate:  117 PR Interval:  141 QRS Duration: 140 QT Interval:  344 QTC Calculation: 480 R Axis:   7 Text Interpretation:  Sinus or ectopic atrial tachycardia Right bundle  branch block Borderline ST depression, lateral leads Baseline wander in  lead(s) V1 Rate faster Confirmed by Wyvonnia Dusky  MD, Adonias Demore [phone]) on  07/21/2014 12:55:45 PM      MDM   Final diagnoses:  Near syncope  ESRD (end stage renal disease)  Tachycardia   shaking episode with near syncope and dizziness that onset 1.5 hours into dialysis. History of similar episodes in the past seen here on February 18 and attributed to hypoglycemia or hypovolemia. No chest pain or shortness of breath. Given albuterol via EMS for apparent wheezing and now tachycardic.  Afebrile. Orthostatics positive, heart rate elevates to 124 with standing.  Patient persistently tachycardic. He is afebrile. White count 11.6. Concern for possible occult sepsis causing symptoms. Though likely due to dehydration and orthostasis.  Blood and urine cultures obtained. Lactate normal. Chest x-ray negative. Urinalysis pending, expected to be contaminated due to nephrostomies.  Patient given gentle IV hydration. Admission discussed with Dr. Marily Memos.  CRITICAL CARE Performed by: Ezequiel Essex Total critical care time: 30 Critical care time was exclusive of separately billable procedures and treating other patients. Critical  care was necessary to treat or prevent imminent or life-threatening deterioration. Critical care was time spent personally by me on the following activities: development of treatment plan with patient and/or surrogate as well as nursing, discussions with consultants, evaluation of patient's response to treatment, examination of patient, obtaining history from patient or surrogate, ordering and performing treatments and interventions, ordering and review of laboratory studies, ordering and review of radiographic studies, pulse oximetry and re-evaluation of patient's condition.    I personally performed the services described in this documentation, which was scribed in my presence. The recorded information has been reviewed and is accurate.   Ezequiel Essex, MD 07/21/14 2119

## 2014-07-21 NOTE — Progress Notes (Signed)
ANTIBIOTIC CONSULT NOTE - INITIAL  Pharmacy Consult for Vancomycin and Zosyn Indication: rule out sepsis  Allergies  Allergen Reactions  . Aspirin     Due to swelling with celecoxib  . Celecoxib Swelling  . Hydrocodone-Acetaminophen Itching  . Lasix [Furosemide] Other (See Comments)    Unknown   . Morphine Itching  . Niacin Itching  . Piroxicam Itching    Patient Measurements: Height: 5\' 8"  (172.7 cm) Weight: 210 lb (95.255 kg) IBW/kg (Calculated) : 68.4  Vital Signs: Temp: 98.7 F (37.1 C) (02/27 1251) Temp Source: Oral (02/27 1251) BP: 112/56 mmHg (02/27 1530) Pulse Rate: 117 (02/27 1530) Intake/Output from previous day:   Intake/Output from this shift:    Labs:  Recent Labs  07/21/14 1348  WBC 11.6*  HGB 10.1*  PLT 216  CREATININE 5.88*   Estimated Creatinine Clearance: 11.4 mL/min (by C-G formula based on Cr of 5.88). No results for input(s): VANCOTROUGH, VANCOPEAK, VANCORANDOM, GENTTROUGH, GENTPEAK, GENTRANDOM, TOBRATROUGH, TOBRAPEAK, TOBRARND, AMIKACINPEAK, AMIKACINTROU, AMIKACIN in the last 72 hours.   Microbiology: No results found for this or any previous visit (from the past 720 hour(s)).  Medical History: Past Medical History  Diagnosis Date  . Dyslipidemia   . DJD (degenerative joint disease)   . Hypertension   . Diabetes mellitus   . BPH (benign prostatic hyperplasia)   . CKD (chronic kidney disease) stage 3, GFR 30-59 ml/min   . Coronary artery disease     a. 1997 s/p MI and prior RCA stenting;  b. 04/2013 MV: inflat ischemia;  c. 05/2013 Cath/PCI: LM 10-20, LAD 30-40p, LCX 95p (3.0x15 Xience DES), 3m, OM1/2 30, RCA 50p diff ISR, 90d (med Rx).  . Bladder cancer   . Stroke    Assessment: 81yo male admitted with suspected sepsis.  SCr elevated on admission. Estimated Creatinine Clearance: 11.4 mL/min (by C-G formula based on Cr of 5.88).  Goal of Therapy:  Vancomycin trough level 15-20 mcg/ml  Plan:  Zosyn 2.25gm IV q8h (renally  adjusted) Vancomycin 1500mg  IV now x 1 F/U labs tomorrow for additional Vancomycin dosing. Monitor labs, renal fxn, and cultures  Hart Robinsons A 07/21/2014,5:52 PM

## 2014-07-21 NOTE — Progress Notes (Signed)
Pt's troponin came back positive at 0.06. MD paged, pt has is asymptomatic at this time. He denies SOB, CP, lightheadedness, nausea. Will continue to monitor.

## 2014-07-21 NOTE — ED Notes (Signed)
Pt reports headache also during "shaking episode. :"

## 2014-07-21 NOTE — ED Notes (Signed)
Per EMS, pt was wheezing when they picked him up. Pt was given albuterol 2.5 mg prior to arrival

## 2014-07-21 NOTE — H&P (Signed)
[facility] History and Physical  Craig Castillo QQP:[id number] DOB: 11-06-1932 DOA: 07/21/2014  Referring physician: Dr. Wyvonnia Dusky - [facility] PCP: Glo Herring., MD   Chief Complaint: Tremors  HPI: Craig Castillo is a 81 y.o. male  Tremors: Onset during dialysis today. Associated with feelings of dizziness described as room spinning. Associated with general "ill feeling "and weakness. Symptoms are constant and getting worse. Patient denies fevers, chest pain, palpitations, abdominal pain, nausea, vomiting, diarrhea. Dizziness resolved before presentation to emergency room. Patient had to stop dialysis early. Similar episodes over the last 2 weeks typically during dialysis. This episode significantly worse than all previous episodes. Told that previous episodes were due to low sugar. Patient denies missing any dialysis recently and started dialysis 2 months ago. Nephrostomy tubes changed 1 month ago per patient. No tenderness at ostomy sites.   Review of Systems:  Constitutional:  No weight loss, night sweats, Fevers, chills, HEENT:  No headaches, Difficulty swallowing,Tooth/dental problems,Sore throat,  No sneezing, itching, ear ache, nasal congestion, post nasal drip,  Cardio-vascular:  No chest pain, Orthopnea, PND, swelling in lower extremities, anasarca, palpitations  GI:  No heartburn, indigestion, abdominal pain, nausea, vomiting, diarrhea, change in bowel habits, loss of appetite  Resp:   No shortness of breath with exertion or at rest. No excess mucus, no productive cough, No non-productive cough, No coughing up of blood.No change in color of mucus.No wheezing.No chest wall deformity  Skin:  no rash or lesions.  GU:  no dysuria, change in color of urine, no urgency or frequency. No flank pain.  Musculoskeletal:   No joint pain or swelling. No decreased range of motion. No back pain.  Psych:  No change in mood or affect. No depression or anxiety. No memory loss.    Past Medical History  Diagnosis Date  . Dyslipidemia   . DJD (degenerative joint disease)   . Hypertension   . Diabetes mellitus   . BPH (benign prostatic hyperplasia)   . CKD (chronic kidney disease) stage 3, GFR 30-59 ml/min   . Coronary artery disease     a. 1997 s/p MI and prior RCA stenting;  b. 04/2013 MV: inflat ischemia;  c. 05/2013 Cath/PCI: LM 10-20, LAD 30-40p, LCX 95p (3.0x15 Xience DES), 83m, OM1/2 30, RCA 50p diff ISR, 90d (med Rx).  . Bladder cancer   . Stroke    Past Surgical History  Procedure Laterality Date  . Cholecystectomy    . Esophagogastroduodenoscopy  06/08/2002    Soft stricture of the GE  junction with erosive esophagitis. The stricture was dilated to 74 [place] using a Maloney dilator. Swollen fold at GE junction on the gastric site which was biopsied for histology and was suspicious for sentinel folds.  . Colonoscopy  06/08/2002    Small polyp was oblated via cold biopsy from the cecum and two were snared, one was at the rectosigmoid junction measuring about a centimeter, another one at the rectum which was smaller.  . Transurethral resection of bladder tumor      x7  . Coronary angioplasty  1997    3 stents  . Transurethral resection of bladder tumor  03/15/2012    Procedure: TRANSURETHRAL RESECTION OF BLADDER TUMOR (TURBT);  Surgeon: Marissa Nestle, MD;  Location: [facility];  Service: Urology;  Laterality: N/A;  . Cataract extraction w/phaco  06/20/2012    Procedure: CATARACT EXTRACTION PHACO AND INTRAOCULAR LENS PLACEMENT (IOC);  Surgeon: Tonny Branch, MD;  Location: [facility];  Service: Ophthalmology;  Laterality: Right;  CDE=16.59  . Cataract extraction w/phaco Left 07/04/2012    Procedure: CATARACT EXTRACTION PHACO AND INTRAOCULAR LENS PLACEMENT (IOC);  Surgeon: Tonny Branch, MD;  Location: [facility];  Service: Ophthalmology;  Laterality: Left;  CDE: 24.33  . Transurethral resection of bladder tumor N/A 09/29/2012    Procedure: TRANSURETHRAL RESECTION OF BLADDER  TUMOR (TURBT);  Surgeon: Marissa Nestle, MD;  Location: [facility];  Service: Urology;  Laterality: N/A;  . Multiple tooth extractions    . Cystoscopy with biopsy N/A 04/27/2013    Procedure: CYSTOSCOPY WITH BLADDER BIOPSY & FULGURATION;  Surgeon: Marissa Nestle, MD;  Location: [facility];  Service: Urology;  Laterality: N/A;  . Left heart catheterization with coronary angiogram N/A 06/09/2013    Procedure: LEFT HEART CATHETERIZATION WITH CORONARY ANGIOGRAM;  Surgeon: Peter M Martinique, MD;  Location: [facility] CATH LAB;  Service: Cardiovascular;  Laterality: N/A;  . Percutaneous coronary rotoblator intervention (pci-r) N/A 06/12/2013    Procedure: PERCUTANEOUS CORONARY ROTOBLATOR INTERVENTION (PCI-R);  Surgeon: Blane Ohara, MD;  Location: [facility] CATH LAB;  Service: Cardiovascular;  Laterality: N/A;   Social History:  reports that he quit smoking about 33 years ago. His smoking use included Cigarettes. He has a 10 pack-year smoking history. He has never used smokeless tobacco. He reports that he does not drink alcohol or use illicit drugs.  Allergies  Allergen Reactions  . Aspirin     Due to swelling with celecoxib  . Celecoxib Swelling  . Hydrocodone-Acetaminophen Itching  . Lasix [Furosemide] Other (See Comments)    Unknown   . Morphine Itching  . Niacin Itching  . Piroxicam Itching    Family History  Problem Relation Age of Onset  . Stroke Mother 10  . Diabetes Brother 18     Prior to Admission medications   Medication Sig Start Date End Date Taking? Authorizing Provider  aspirin EC 81 MG tablet Take 81 mg by mouth daily.   Yes Historical Provider, MD  atorvastatin (LIPITOR) 20 MG tablet Take 20 mg by mouth daily.   Yes Historical Provider, MD  clopidogrel (PLAVIX) 75 MG tablet Take 75 mg by mouth daily.   Yes Historical Provider, MD  ezetimibe (ZETIA) 10 MG tablet Take 10 mg by mouth every morning.    Yes Historical Provider, MD  furosemide (LASIX) 40 MG tablet Take 40 mg by mouth 2 (two)  times daily.   Yes Historical Provider, MD  gemfibrozil (LOPID) 600 MG tablet Take 600 mg by mouth 2 (two) times daily before a meal.   Yes Historical Provider, MD  hydrochlorothiazide (HYDRODIURIL) 25 MG tablet Take 1 tablet by mouth at bedtime.  01/23/14  Yes Historical Provider, MD  isosorbide mononitrate (IMDUR) 60 MG 24 hr tablet Take 1 tablet (60 mg total) by mouth daily. 12/12/13  Yes Arnoldo Lenis, MD  metoprolol tartrate (LOPRESSOR) 25 MG tablet TAKE 1/2 TABLET BY MOUTH TWICE DAILY. Patient taking differently: Take 25 mg by mouth 2 (two) times daily. TAKE 1/2 TABLET BY MOUTH TWICE DAILY. 05/22/14  Yes Arnoldo Lenis, MD  omeprazole (PRILOSEC) 20 MG capsule Take 20 mg by mouth every morning.    Yes Historical Provider, MD  tamsulosin (FLOMAX) 0.4 MG CAPS capsule Take 0.4 mg by mouth.    Yes Historical Provider, MD  amLODipine (NORVASC) 10 MG tablet Take 10 mg by mouth daily.    Historical Provider, MD  calcitRIOL (ROCALTROL) 0.25 MCG capsule Take 1 capsule (0.25 mcg total) by mouth daily. Patient  not taking: Reported on 07/12/2014 06/14/13   Coral Spikes, DO  insulin NPH (HUMULIN N,NOVOLIN N) 100 UNIT/ML injection Inject 15-20 Units into the skin 2 (two) times daily. Take 20 units SQ QAM and 15 units SQ QPM Patient not taking: Reported on 07/12/2014 04/28/13   Kathie Dike, MD  nitroGLYCERIN (NITROSTAT) 0.4 MG SL tablet Place 1 tablet (0.4 mg total) under the tongue every 5 (five) minutes x 3 doses as needed for chest pain. Chest Pain 02/19/14   Arnoldo Lenis, MD   Physical Exam: Filed Vitals:   07/21/14 1400 07/21/14 1434 07/21/14 1500 07/21/14 1530  BP: 125/64 118/58 124/57 112/56  Pulse: 115  118 117  Temp:      TempSrc:      Resp: 24  21 26   Height:      Weight:      SpO2: 96%  96% 96%    Wt Readings from Last 3 Encounters:  07/21/14 95.255 kg (210 lb)  07/12/14 95.255 kg (210 lb)  03/02/14 96.616 kg (213 lb)    General: Ill appearing Eyes:  PERRL, normal lids,  irises & conjunctiva ENT: Dry mucus membranes Neck:  no LAD, masses or thyromegaly Cardiovascular: faint heart sounds. II/VI systolic murmur. Trace LE edema Telemetry:  SR, no arrhythmias  Respiratory:  CTA bilaterally, no w/r/r. Normal respiratory effort. Abdomen:  soft, ntnd Skin: bandages covering nephrostomy tubes c/d/i (changed today).  no rash or induration seen on limited exam Musculoskeletal:  grossly normal tone BUE/BLE Psychiatric:  grossly normal mood and affect, speech fluent and appropriate Neurologic: AAOx3, grossly non-focal.          Labs on Admission:  Basic Metabolic Panel:  Recent Labs Lab 07/21/14 1348  NA 136  K 3.3*  CL 96  CO2 27  GLUCOSE 163*  BUN 39*  CREATININE 5.88*  CALCIUM 8.7   Liver Function Tests:  Recent Labs Lab 07/21/14 1348  AST 13  ALT 5  ALKPHOS 78  BILITOT 0.8  PROT 7.4  ALBUMIN 3.1*   No results for input(s): LIPASE, AMYLASE in the last 168 hours. No results for input(s): AMMONIA in the last 168 hours. CBC:  Recent Labs Lab 07/21/14 1348  WBC 11.6*  NEUTROABS 10.9*  HGB 10.1*  HCT 31.1*  MCV 92.8  PLT 216   Cardiac Enzymes:  Recent Labs Lab 07/21/14 1348  TROPONINI 0.03    BNP (last 3 results)  Recent Labs  07/21/14 1348  BNP 142.0*    ProBNP (last 3 results) No results for input(s): PROBNP in the last 8760 hours.  CBG: No results for input(s): GLUCAP in the last 168 hours.  Radiological Exams on Admission: Dg Chest Portable 1 View  07/21/2014   CLINICAL DATA:  Tremors  EXAM: PORTABLE CHEST - 1 VIEW  COMPARISON:  06/08/2013  FINDINGS: Normal heart size. Low lung volumes. Bibasilar atelectasis. Upper lungs clear. Normal vascularity. Right internal jugular dialysis catheter in place with its tip at the cavoatrial junction.  IMPRESSION: Bibasilar atelectasis.   Electronically Signed   By: Marybelle Killings M.D.   On: 07/21/2014 14:32    EKG: Independently reviewed. Sinus, tach, RBBB, no sign of ACS, no  significatn change from previous  Assessment/Plan Principal Problem:   Sepsis Active Problems:   HLD (hyperlipidemia)   Essential hypertension   DM (diabetes mellitus), type 2 with renal complications   Near syncope   Weakness   History of bladder cancer   History of stroke   ESRD  on dialysis   GERD (gastroesophageal reflux disease)  Early Sepsis: pt ill appearing, significant chronic medical conditions including bilateral nephrostomy tubes on dialysis, tachycardic, tachypneic, orthostatic, WBC 11.6. Lactic acid 1.88. Afebrile. Chest x-ray with atelectasis only. Patient's symptoms of tremor and weakness may be due to dialysis but favor more aggressive therapy at this time. Patient has received total of 1 L normal saline bolus. - [place] (anticipate contamination due to chronic indwelling nephrostomy tubes) - Vanc Zos - Procalcitonin - 581mL normal saline bolus - Gentle hydration w/ NS 91ml/hr due to ESRD/Dialysis  ESRD/dialysis: Patient only made it through part of his dialysis today. Dialysis on Tuesday Thursday Saturday. Nephrology care through [facility]. Started dialysis 2 months ago. ESRD secondary to hydronephrosis secondary to chronic nephrostomy tubes secondary to bladder cancer and bladder inactivity. - Consult nephrology - Continue Flomax -   HTN: Hypotensive/orthostatic - Hold home Norvasc, Lasix, HCTZ, metoprolol, Imdur - Hydralazine when necessary SBP> 180  DM: Recently taken off insulin. Last A1c 2014, 5.6 - A1c  - SSI   HLD: Continue Zetia, Lipitor  GERD: -Patient continue PPI  H/O Stroke: no deficits.  - monitor  CAD/MI: Status post cardiac cath and DES placement (05-2013) - Continue aspirin and Plavix   Code Status: FULL DVT Prophylaxis: Hep Family Communication: Wife Disposition Plan: Pending improvement  Craig Greeno Lenna Sciara, MD Family Medicine [facility] [email or web address] Password [email or web address]

## 2014-07-21 NOTE — ED Notes (Signed)
Order to admit changed to stepdown.

## 2014-07-22 LAB — GLUCOSE, CAPILLARY
GLUCOSE-CAPILLARY: 140 mg/dL — AB (ref 70–99)
Glucose-Capillary: 170 mg/dL — ABNORMAL HIGH (ref 70–99)
Glucose-Capillary: 185 mg/dL — ABNORMAL HIGH (ref 70–99)
Glucose-Capillary: 167 mg/dL — ABNORMAL HIGH (ref 70–99)

## 2014-07-22 LAB — COMPREHENSIVE METABOLIC PANEL
ALK PHOS: 61 U/L (ref 39–117)
ALT: 6 U/L (ref 0–53)
AST: 12 U/L (ref 0–37)
Albumin: 2.6 g/dL — ABNORMAL LOW (ref 3.5–5.2)
Anion gap: 11 (ref 5–15)
BUN: 49 mg/dL — AB (ref 6–23)
CHLORIDE: 100 mmol/L (ref 96–112)
CO2: 22 mmol/L (ref 19–32)
Calcium: 8.3 mg/dL — ABNORMAL LOW (ref 8.4–10.5)
Creatinine, Ser: 7.12 mg/dL — ABNORMAL HIGH (ref 0.50–1.35)
GFR calc Af Amer: 7 mL/min — ABNORMAL LOW (ref >90)
GFR, EST NON AFRICAN AMERICAN: 6 mL/min — AB (ref >90)
Glucose, Bld: 162 mg/dL — ABNORMAL HIGH (ref 70–99)
Potassium: 3.5 mmol/L (ref 3.5–5.1)
Sodium: 133 mmol/L — ABNORMAL LOW (ref 135–145)
Total Bilirubin: 0.6 mg/dL (ref 0.3–1.2)
Total Protein: 6.6 g/dL (ref 6.0–8.3)

## 2014-07-22 LAB — URINALYSIS, ROUTINE W REFLEX MICROSCOPIC
GLUCOSE, UA: NEGATIVE mg/dL
Ketones, ur: 15 mg/dL — AB
Nitrite: POSITIVE — AB
Protein, ur: >300 mg/dL — AB
Specific Gravity, Urine: 1.02 (ref 1.005–1.030)
UROBILINOGEN UA: 2 mg/dL — AB (ref 0.0–1.0)
pH: 7.5 (ref 5.0–8.0)

## 2014-07-22 LAB — CBC
HEMATOCRIT: 27.2 % — AB (ref 39.0–52.0)
HEMOGLOBIN: 8.9 g/dL — AB (ref 13.0–17.0)
MCH: 30.6 pg (ref 26.0–34.0)
MCHC: 32.7 g/dL (ref 30.0–36.0)
MCV: 93.5 fL (ref 78.0–100.0)
Platelets: 190 10*3/uL (ref 150–400)
RBC: 2.91 MIL/uL — ABNORMAL LOW (ref 4.22–5.81)
RDW: 14.6 % (ref 11.5–15.5)
WBC: 8.2 10*3/uL (ref 4.0–10.5)

## 2014-07-22 LAB — CORTISOL: CORTISOL PLASMA: 21.1 ug/dL

## 2014-07-22 LAB — URINE MICROSCOPIC-ADD ON

## 2014-07-22 MED ORDER — CEFTRIAXONE SODIUM IN DEXTROSE 20 MG/ML IV SOLN
1.0000 g | INTRAVENOUS | Status: DC
  Administered 2014-07-22: 1 g via INTRAVENOUS
  Filled 2014-07-22 (×3): qty 50

## 2014-07-22 MED ORDER — LIDOCAINE HCL (PF) 1 % IJ SOLN: 5.0000 mL | INTRAMUSCULAR | Status: DC | PRN

## 2014-07-22 MED ORDER — ALTEPLASE 2 MG IJ SOLR
2.0000 mg | Freq: Once | INTRAMUSCULAR | Status: AC | PRN
  Filled 2014-07-22: qty 2

## 2014-07-22 MED ORDER — PENTAFLUOROPROP-TETRAFLUOROETH EX AERO: 1.0000 "application " | INHALATION_SPRAY | CUTANEOUS | Status: DC | PRN

## 2014-07-22 MED ORDER — VANCOMYCIN HCL IN DEXTROSE 1-5 GM/200ML-% IV SOLN: 1000.0000 mg | INTRAVENOUS | Status: DC

## 2014-07-22 MED ORDER — VANCOMYCIN HCL IN DEXTROSE 1-5 GM/200ML-% IV SOLN
1000.0000 mg | INTRAVENOUS | Status: DC
  Filled 2014-07-22: qty 200

## 2014-07-22 MED ORDER — SODIUM CHLORIDE 0.9 % IV SOLN: 100.0000 mL | INTRAVENOUS | Status: DC | PRN

## 2014-07-22 MED ORDER — HEPARIN SODIUM (PORCINE) 1000 UNIT/ML DIALYSIS
1000.0000 [IU] | INTRAMUSCULAR | Status: DC | PRN
  Filled 2014-07-22: qty 1

## 2014-07-22 MED ORDER — LIDOCAINE-PRILOCAINE 2.5-2.5 % EX CREA: 1.0000 "application " | TOPICAL_CREAM | CUTANEOUS | Status: DC | PRN

## 2014-07-22 NOTE — Progress Notes (Signed)
[facility] PROGRESS NOTE  Craig Castillo OJJ:[id number] DOB: [birth year] DOA: 07/21/2014 PCP: Glo Herring., MD  Assessment/Plan: Tremors -Etiology unclear: ?uremia vs early sepsis/ -Resolved.  UTI -With indwelling suprapubic catheters. -Will narrow antibiotics to rocephin pending cx data.  ESRD -As per renal.  HTN -Well controlled.  DM -Fair control. -No medication changes.  HLD -Continue statin/zetia  GERD -Continue PPI.  CAD -Stable, no CP. -Continue ASA/Plavix  Code Status: Full Code Family Communication: patient only  Disposition Plan: Transfer to floor. -Hope for DC in 24-48 hours.   Consultants:  Renal, Dr. Lowanda Foster   Antibiotics:  Rocephin   Subjective: No Complaints.  Objective: Filed Vitals:   07/22/14 0700 07/22/14 0800 07/22/14 0821 07/22/14 1218  BP: 122/60 130/60    Pulse: 87 87    Temp:   98.5 F (36.9 C) 98.2 F (36.8 C)  TempSrc:   Oral Oral  Resp: 20 20    Height:      Weight:      SpO2: 96% 98%      Intake/Output Summary (Last 24 hours) at 07/22/14 1846 Last data filed at 07/22/14 1657  Gross per 24 hour  Intake 474.67 ml  Output    500 ml  Net -25.33 ml   Filed Weights   07/21/14 1252 07/22/14 0500  Weight: 95.255 kg (210 lb) 96.1 kg (211 lb 13.8 oz)    Exam:   General:  AA Ox3  Cardiovascular: RRR  Respiratory: CTA B  Abdomen: S/NT/ND/+BS  Extremities: 1-2+ edema bilaterally   Neurologic:  Non-focal  Data Reviewed: Basic Metabolic Panel:  Recent Labs Lab 07/21/14 1348 07/22/14 0556  NA 136 133*  K 3.3* 3.5  CL 96 100  CO2 27 22  GLUCOSE 163* 162*  BUN 39* 49*  CREATININE 5.88* 7.12*  CALCIUM 8.7 8.3*   Liver Function Tests:  Recent Labs Lab 07/21/14 1348 07/22/14 0556  AST 13 12  ALT 5 6  ALKPHOS 78 61  BILITOT 0.8 0.6  PROT 7.4 6.6  ALBUMIN 3.1* 2.6*   No results for input(s): LIPASE, AMYLASE in the last 168 hours. No results for input(s): AMMONIA in the  last 168 hours. CBC:  Recent Labs Lab 07/21/14 1348 07/22/14 0556  WBC 11.6* 8.2  NEUTROABS 10.9*  --   HGB 10.1* 8.9*  HCT 31.1* 27.2*  MCV 92.8 93.5  PLT 216 190   Cardiac Enzymes:  Recent Labs Lab 07/21/14 1348 07/21/14 1729  TROPONINI 0.03 0.06*   BNP (last 3 results)  Recent Labs  07/21/14 1348  BNP 142.0*    ProBNP (last 3 results) No results for input(s): PROBNP in the last 8760 hours.  CBG:  Recent Labs Lab 07/21/14 1803 07/21/14 2154 07/22/14 0811 07/22/14 1136 07/22/14 1656  GLUCAP 155* 264* 170* 185* 167*    Recent Results (from the past 240 hour(s))  Culture, blood (x 2)     Status: None (Preliminary result)   Collection Time: 07/21/14  5:29 PM  Result Value Ref Range Status   Specimen Description RIGHT ANTECUBITAL  Final   Special Requests BOTTLES DRAWN AEROBIC AND ANAEROBIC 6CC  Final   Culture NO GROWTH 1 DAY  Final   Report Status PENDING  Incomplete  Culture, blood (x 2)     Status: None (Preliminary result)   Collection Time: 07/21/14  5:29 PM  Result Value Ref Range Status   Specimen Description RIGHT ANTECUBITAL  Final   Special Requests BOTTLES DRAWN AEROBIC AND ANAEROBIC 6CC  Final   Culture NO GROWTH 1 DAY  Final   Report Status PENDING  Incomplete  MRSA PCR Screening     Status: None   Collection Time: 07/21/14  6:15 PM  Result Value Ref Range Status   MRSA by PCR NEGATIVE NEGATIVE Final    Comment:        The GeneXpert MRSA Assay (FDA approved for NASAL specimens only), is one component of a comprehensive MRSA colonization surveillance program. It is not intended to diagnose MRSA infection nor to guide or monitor treatment for MRSA infections.      Studies: Dg Chest Portable 1 View  07/21/2014   CLINICAL DATA:  Tremors  EXAM: PORTABLE CHEST - 1 VIEW  COMPARISON:  06/08/2013  FINDINGS: Normal heart size. Low lung volumes. Bibasilar atelectasis. Upper lungs clear. Normal vascularity. Right internal jugular dialysis  catheter in place with its tip at the cavoatrial junction.  IMPRESSION: Bibasilar atelectasis.   Electronically Signed   By: Marybelle Killings M.D.   On: 07/21/2014 14:32    Scheduled Meds: . aspirin EC  81 mg Oral Daily  . atorvastatin  20 mg Oral Daily  . cefTRIAXone (ROCEPHIN)  IV  1 g Intravenous Q24H  . clopidogrel  75 mg Oral Daily  . ezetimibe  10 mg Oral q morning - 10a  . heparin  5,000 Units Subcutaneous 3 times per day  . insulin aspart  0-9 Units Subcutaneous TID WC  . pantoprazole  40 mg Oral Daily  . sodium chloride  3 mL Intravenous Q12H  . tamsulosin  0.4 mg Oral Daily   Continuous Infusions:   Principal Problem:   Sepsis Active Problems:   HLD (hyperlipidemia)   Essential hypertension   DM (diabetes mellitus), type 2 with renal complications   Near syncope   Weakness   History of bladder cancer   History of stroke   ESRD on dialysis   GERD (gastroesophageal reflux disease)    Time spent: 35 minutes. Greater than 50% of this time was spent in direct contact with the patient coordinating care.    Lelon Frohlich  [facility] Pager [phone]  If 7PM-7AM, please contact night-coverage at [email or web address], password [facility] 07/22/2014, 6:46 PM  LOS: 1 day

## 2014-07-22 NOTE — Progress Notes (Addendum)
Dialysis orders have been clarified through dialysis nurse and Dr. Lowanda Foster. Levada Dy, dialysis nurse, will be here in the morning to complete dialysis with pt.

## 2014-07-22 NOTE — Progress Notes (Addendum)
ANTIBIOTIC CONSULT NOTE - follow up  Pharmacy Consult for Vancomycin and Zosyn Indication: rule out sepsis  Allergies  Allergen Reactions  . Aspirin     Due to swelling with celecoxib  . Celecoxib Swelling  . Hydrocodone-Acetaminophen Itching  . Lasix [Furosemide] Other (See Comments)    Unknown   . Morphine Itching  . Niacin Itching  . Piroxicam Itching   Patient Measurements: Height: 5\' 8"  (172.7 cm) Weight: 211 lb 13.8 oz (96.1 kg) IBW/kg (Calculated) : 68.4  Vital Signs: Temp: 98.5 F (36.9 C) (02/28 0821) Temp Source: Oral (02/28 0821) BP: 130/60 mmHg (02/28 0800) Pulse Rate: 87 (02/28 0800) Intake/Output from previous day: 02/27 0701 - 02/28 0700 In: 474.7 [I.V.:374.7; IV Piggyback:100] Out: 150 [Urine:150] Intake/Output from this shift:    Labs:  Recent Labs  07/21/14 1348 07/22/14 0556  WBC 11.6* 8.2  HGB 10.1* 8.9*  PLT 216 190  CREATININE 5.88* 7.12*   Estimated Creatinine Clearance: 9.5 mL/min (by C-G formula based on Cr of 7.12). No results for input(s): VANCOTROUGH, VANCOPEAK, VANCORANDOM, GENTTROUGH, GENTPEAK, GENTRANDOM, TOBRATROUGH, TOBRAPEAK, TOBRARND, AMIKACINPEAK, AMIKACINTROU, AMIKACIN in the last 72 hours.   Microbiology: Recent Results (from the past 720 hour(s))  Culture, blood (x 2)     Status: None (Preliminary result)   Collection Time: 07/21/14  5:29 PM  Result Value Ref Range Status   Specimen Description RIGHT ANTECUBITAL  Final   Special Requests BOTTLES DRAWN AEROBIC AND ANAEROBIC 6CC  Final   Culture NO GROWTH 1 DAY  Final   Report Status PENDING  Incomplete  Culture, blood (x 2)     Status: None (Preliminary result)   Collection Time: 07/21/14  5:29 PM  Result Value Ref Range Status   Specimen Description RIGHT ANTECUBITAL  Final   Special Requests BOTTLES DRAWN AEROBIC AND ANAEROBIC 6CC  Final   Culture NO GROWTH 1 DAY  Final   Report Status PENDING  Incomplete  MRSA PCR Screening     Status: None   Collection Time:  07/21/14  6:15 PM  Result Value Ref Range Status   MRSA by PCR NEGATIVE NEGATIVE Final    Comment:        The GeneXpert MRSA Assay (FDA approved for NASAL specimens only), is one component of a comprehensive MRSA colonization surveillance program. It is not intended to diagnose MRSA infection nor to guide or monitor treatment for MRSA infections.     Medical History: Past Medical History  Diagnosis Date  . Dyslipidemia   . DJD (degenerative joint disease)   . Hypertension   . Diabetes mellitus   . BPH (benign prostatic hyperplasia)   . CKD (chronic kidney disease) stage 3, GFR 30-59 ml/min   . Coronary artery disease     a. 1997 s/p MI and prior RCA stenting;  b. 04/2013 MV: inflat ischemia;  c. 05/2013 Cath/PCI: LM 10-20, LAD 30-40p, LCX 95p (3.0x15 Xience DES), 65m, OM1/2 30, RCA 50p diff ISR, 90d (med Rx).  . Bladder cancer   . Stroke    Assessment: 81yo male admitted with suspected sepsis.  SCr elevated on admission. Pt reportedly started dialysis 2 months ago. Estimated Creatinine Clearance: 9.5 mL/min (by C-G formula based on Cr of 7.12).  Goal of Therapy:  Pre-Hemodialysis Vancomycin level goal range =15-25 mcg/ml  Plan:  Zosyn 2.25gm IV q8h (dialysis dosing) Vancomycin 1500mg  IV x 1 then 1000mg  after each dialysis F/U dialysis days Monitor labs and cultures  Hart Robinsons A 07/22/2014,10:42 AM

## 2014-07-22 NOTE — Consult Note (Signed)
Reason for Consult: End-stage renal disease Referring Physician: Dr. Lanier Prude is an 81 y.o. male.  HPI: He is a patient who has history of diabetes, hypertension, history of bladder cancer status post nephrostomy tube placement and end-stage renal disease on maintenance hemodialysis presently came with complaints of shivering, difficulty breathing when he was on dialysis. Patient didn't have any fever but had some chills. After 1 hour and half of dialysis his dialysis was discontinued and seemed to the emergency room because of lack of improvement on dialysis. Patient had previous episode but that was thought to be secondary to hypoglycemia and hypotension. Presently patient seems to be much better. He denies any chills, fever, difficulty breathing. His appetite is good and no chest pain.  Past Medical History  Diagnosis Date  . Dyslipidemia   . DJD (degenerative joint disease)   . Hypertension   . Diabetes mellitus   . BPH (benign prostatic hyperplasia)   . CKD (chronic kidney disease) stage 3, GFR 30-59 ml/min   . Coronary artery disease     a. 1997 s/p MI and prior RCA stenting;  b. 04/2013 MV: inflat ischemia;  c. 05/2013 Cath/PCI: LM 10-20, LAD 30-40p, LCX 95p (3.0x15 Xience DES), 22m, OM1/2 30, RCA 50p diff ISR, 90d (med Rx).  . Bladder cancer   . Stroke     Past Surgical History  Procedure Laterality Date  . Cholecystectomy    . Esophagogastroduodenoscopy  06/08/2002    Soft stricture of the GE  junction with erosive esophagitis. The stricture was dilated to 16 [place] using a Maloney dilator. Swollen fold at GE junction on the gastric site which was biopsied for histology and was suspicious for sentinel folds.  . Colonoscopy  06/08/2002    Small polyp was oblated via cold biopsy from the cecum and two were snared, one was at the rectosigmoid junction measuring about a centimeter, another one at the rectum which was smaller.  . Transurethral resection of bladder  tumor      x7  . Coronary angioplasty  1997    3 stents  . Transurethral resection of bladder tumor  03/15/2012    Procedure: TRANSURETHRAL RESECTION OF BLADDER TUMOR (TURBT);  Surgeon: Marissa Nestle, MD;  Location: [facility];  Service: Urology;  Laterality: N/A;  . Cataract extraction w/phaco  06/20/2012    Procedure: CATARACT EXTRACTION PHACO AND INTRAOCULAR LENS PLACEMENT (IOC);  Surgeon: Tonny Branch, MD;  Location: [facility];  Service: Ophthalmology;  Laterality: Right;  CDE=16.59  . Cataract extraction w/phaco Left 07/04/2012    Procedure: CATARACT EXTRACTION PHACO AND INTRAOCULAR LENS PLACEMENT (IOC);  Surgeon: Tonny Branch, MD;  Location: [facility];  Service: Ophthalmology;  Laterality: Left;  CDE: 24.33  . Transurethral resection of bladder tumor N/A 09/29/2012    Procedure: TRANSURETHRAL RESECTION OF BLADDER TUMOR (TURBT);  Surgeon: Marissa Nestle, MD;  Location: [facility];  Service: Urology;  Laterality: N/A;  . Multiple tooth extractions    . Cystoscopy with biopsy N/A 04/27/2013    Procedure: CYSTOSCOPY WITH BLADDER BIOPSY & FULGURATION;  Surgeon: Marissa Nestle, MD;  Location: [facility];  Service: Urology;  Laterality: N/A;  . Left heart catheterization with coronary angiogram N/A 06/09/2013    Procedure: LEFT HEART CATHETERIZATION WITH CORONARY ANGIOGRAM;  Surgeon: Peter M Martinique, MD;  Location: [facility] CATH LAB;  Service: Cardiovascular;  Laterality: N/A;  . Percutaneous coronary rotoblator intervention (pci-r) N/A 06/12/2013    Procedure: PERCUTANEOUS CORONARY ROTOBLATOR INTERVENTION (PCI-R);  Surgeon:  Micheline Chapman, MD;  Location: [facility] CATH LAB;  Service: Cardiovascular;  Laterality: N/A;    Family History  Problem Relation Age of Onset  . Stroke Mother 26  . Diabetes Brother 93    Social History:  reports that he quit smoking about 33 years ago. His smoking use included Cigarettes. He has a 10 pack-year smoking history. He has never used smokeless tobacco. He reports that he does not drink  alcohol or use illicit drugs.  Allergies:  Allergies  Allergen Reactions  . Aspirin     Due to swelling with celecoxib  . Celecoxib Swelling  . Hydrocodone-Acetaminophen Itching  . Lasix [Furosemide] Other (See Comments)    Unknown   . Morphine Itching  . Niacin Itching  . Piroxicam Itching    Medications: I have reviewed the patient's current medications.  Results for orders placed or performed during the hospital encounter of 07/21/14 (from the past 48 hour(s))  Urinalysis, Routine w reflex microscopic     Status: Abnormal   Collection Time: 07/21/14  1:38 PM  Result Value Ref Range   Color, Urine RED (A) YELLOW    Comment: BIOCHEMICALS MAY BE AFFECTED BY COLOR   APPearance TURBID (A) CLEAR   Specific Gravity, Urine 1.020 1.005 - 1.030   pH 7.5 5.0 - 8.0   Glucose, UA NEGATIVE NEGATIVE mg/dL   Hgb urine dipstick LARGE (A) NEGATIVE   Bilirubin Urine MODERATE (A) NEGATIVE   Ketones, ur 15 (A) NEGATIVE mg/dL   Protein, ur >765 (A) NEGATIVE mg/dL   Urobilinogen, UA 2.0 (H) 0.0 - 1.0 mg/dL   Nitrite POSITIVE (A) NEGATIVE   Leukocytes, UA LARGE (A) NEGATIVE  Urine microscopic-add on     Status: Abnormal   Collection Time: 07/21/14  1:38 PM  Result Value Ref Range   Squamous Epithelial / LPF RARE RARE   WBC, UA TOO NUMEROUS TO COUNT <3 WBC/hpf   RBC / HPF TOO NUMEROUS TO COUNT <3 RBC/hpf   Bacteria, UA MANY (A) RARE  CBC with Differential/Platelet     Status: Abnormal   Collection Time: 07/21/14  1:48 PM  Result Value Ref Range   WBC 11.6 (H) 4.0 - 10.5 K/uL   RBC 3.35 (L) 4.22 - 5.81 MIL/uL   Hemoglobin 10.1 (L) 13.0 - 17.0 g/dL   HCT 86.8 (L) 97.7 - 48.1 %   MCV 92.8 78.0 - 100.0 fL   MCH 30.1 26.0 - 34.0 pg   MCHC 32.5 30.0 - 36.0 g/dL   RDW 36.3 82.3 - 65.4 %   Platelets 216 150 - 400 K/uL   Neutrophils Relative % 93 (H) 43 - 77 %   Neutro Abs 10.9 (H) 1.7 - 7.7 K/uL   Lymphocytes Relative 4 (L) 12 - 46 %   Lymphs Abs 0.4 (L) 0.7 - 4.0 K/uL   Monocytes  Relative 2 (L) 3 - 12 %   Monocytes Absolute 0.2 0.1 - 1.0 K/uL   Eosinophils Relative 1 0 - 5 %   Eosinophils Absolute 0.1 0.0 - 0.7 K/uL   Basophils Relative 0 0 - 1 %   Basophils Absolute 0.0 0.0 - 0.1 K/uL  Comprehensive metabolic panel     Status: Abnormal   Collection Time: 07/21/14  1:48 PM  Result Value Ref Range   Sodium 136 135 - 145 mmol/L   Potassium 3.3 (L) 3.5 - 5.1 mmol/L   Chloride 96 96 - 112 mmol/L   CO2 27 19 - 32 mmol/L  Glucose, Bld 163 (H) 70 - 99 mg/dL   BUN 39 (H) 6 - 23 mg/dL   Creatinine, Ser 5.88 (H) 0.50 - 1.35 mg/dL   Calcium 8.7 8.4 - 10.5 mg/dL   Total Protein 7.4 6.0 - 8.3 g/dL   Albumin 3.1 (L) 3.5 - 5.2 g/dL   AST 13 0 - 37 U/L   ALT 5 0 - 53 U/L   Alkaline Phosphatase 78 39 - 117 U/L   Total Bilirubin 0.8 0.3 - 1.2 mg/dL   GFR calc non Af Amer 8 (L) >90 mL/min   GFR calc Af Amer 9 (L) >90 mL/min    Comment: (NOTE) The eGFR has been calculated using the CKD EPI equation. This calculation has not been validated in all clinical situations. eGFR's persistently <90 mL/min signify possible Chronic Kidney Disease.    Anion gap 13 5 - 15  Troponin I     Status: None   Collection Time: 07/21/14  1:48 PM  Result Value Ref Range   Troponin I 0.03 <0.031 ng/mL    Comment:        NO INDICATION OF MYOCARDIAL INJURY.   Brain natriuretic peptide     Status: Abnormal   Collection Time: 07/21/14  1:48 PM  Result Value Ref Range   B Natriuretic Peptide 142.0 (H) 0.0 - 100.0 pg/mL  I-Stat CG4 Lactic Acid, ED     Status: None   Collection Time: 07/21/14  2:02 PM  Result Value Ref Range   Lactic Acid, Venous 1.88 0.5 - 2.0 mmol/L  Culture, blood (x 2)     Status: None (Preliminary result)   Collection Time: 07/21/14  5:29 PM  Result Value Ref Range   Specimen Description RIGHT ANTECUBITAL    Special Requests BOTTLES DRAWN AEROBIC AND ANAEROBIC 6CC    Culture NO GROWTH 1 DAY    Report Status PENDING   Culture, blood (x 2)     Status: None  (Preliminary result)   Collection Time: 07/21/14  5:29 PM  Result Value Ref Range   Specimen Description RIGHT ANTECUBITAL    Special Requests BOTTLES DRAWN AEROBIC AND ANAEROBIC 6CC    Culture NO GROWTH 1 DAY    Report Status PENDING   Troponin I     Status: Abnormal   Collection Time: 07/21/14  5:29 PM  Result Value Ref Range   Troponin I 0.06 (H) <0.031 ng/mL    Comment:        PERSISTENTLY INCREASED TROPONIN VALUES IN THE RANGE OF 0.04-0.49 ng/mL CAN BE SEEN IN:       -UNSTABLE ANGINA       -CONGESTIVE HEART FAILURE       -MYOCARDITIS       -CHEST TRAUMA       -ARRYHTHMIAS       -LATE PRESENTING MYOCARDIAL INFARCTION       -COPD   CLINICAL FOLLOW-UP RECOMMENDED.   Protime-INR     Status: None   Collection Time: 07/21/14  5:29 PM  Result Value Ref Range   Prothrombin Time 15.1 11.6 - 15.2 seconds   INR 1.18 0.00 - 1.49  APTT     Status: Abnormal   Collection Time: 07/21/14  5:29 PM  Result Value Ref Range   aPTT 44 (H) 24 - 37 seconds    Comment:        IF BASELINE aPTT IS ELEVATED, SUGGEST PATIENT RISK ASSESSMENT BE USED TO DETERMINE APPROPRIATE ANTICOAGULANT THERAPY.   Fibrinogen  Status: Abnormal   Collection Time: 07/21/14  5:29 PM  Result Value Ref Range   Fibrinogen 740 (H) 204 - 475 mg/dL  Glucose, capillary     Status: Abnormal   Collection Time: 07/21/14  6:03 PM  Result Value Ref Range   Glucose-Capillary 155 (H) 70 - 99 mg/dL   Comment 1 Notify RN    Comment 2 Document in Chart   MRSA PCR Screening     Status: None   Collection Time: 07/21/14  6:15 PM  Result Value Ref Range   MRSA by PCR NEGATIVE NEGATIVE    Comment:        The GeneXpert MRSA Assay (FDA approved for NASAL specimens only), is one component of a comprehensive MRSA colonization surveillance program. It is not intended to diagnose MRSA infection nor to guide or monitor treatment for MRSA infections.   Glucose, capillary     Status: Abnormal   Collection Time: 07/21/14   9:54 PM  Result Value Ref Range   Glucose-Capillary 264 (H) 70 - 99 mg/dL  CBC     Status: Abnormal   Collection Time: 07/22/14  5:56 AM  Result Value Ref Range   WBC 8.2 4.0 - 10.5 K/uL   RBC 2.91 (L) 4.22 - 5.81 MIL/uL   Hemoglobin 8.9 (L) 13.0 - 17.0 g/dL   HCT 27.2 (L) 39.0 - 52.0 %   MCV 93.5 78.0 - 100.0 fL   MCH 30.6 26.0 - 34.0 pg   MCHC 32.7 30.0 - 36.0 g/dL   RDW 14.6 11.5 - 15.5 %   Platelets 190 150 - 400 K/uL  Comprehensive metabolic panel     Status: Abnormal   Collection Time: 07/22/14  5:56 AM  Result Value Ref Range   Sodium 133 (L) 135 - 145 mmol/L   Potassium 3.5 3.5 - 5.1 mmol/L   Chloride 100 96 - 112 mmol/L   CO2 22 19 - 32 mmol/L   Glucose, Bld 162 (H) 70 - 99 mg/dL   BUN 49 (H) 6 - 23 mg/dL   Creatinine, Ser 7.12 (H) 0.50 - 1.35 mg/dL   Calcium 8.3 (L) 8.4 - 10.5 mg/dL   Total Protein 6.6 6.0 - 8.3 g/dL   Albumin 2.6 (L) 3.5 - 5.2 g/dL   AST 12 0 - 37 U/L   ALT 6 0 - 53 U/L   Alkaline Phosphatase 61 39 - 117 U/L   Total Bilirubin 0.6 0.3 - 1.2 mg/dL   GFR calc non Af Amer 6 (L) >90 mL/min   GFR calc Af Amer 7 (L) >90 mL/min    Comment: (NOTE) The eGFR has been calculated using the CKD EPI equation. This calculation has not been validated in all clinical situations. eGFR's persistently <90 mL/min signify possible Chronic Kidney Disease.    Anion gap 11 5 - 15  Glucose, capillary     Status: Abnormal   Collection Time: 07/22/14  8:11 AM  Result Value Ref Range   Glucose-Capillary 170 (H) 70 - 99 mg/dL   Comment 1 Notify RN    Comment 2 Document in Chart     Dg Chest Portable 1 View  07/21/2014   CLINICAL DATA:  Tremors  EXAM: PORTABLE CHEST - 1 VIEW  COMPARISON:  06/08/2013  FINDINGS: Normal heart size. Low lung volumes. Bibasilar atelectasis. Upper lungs clear. Normal vascularity. Right internal jugular dialysis catheter in place with its tip at the cavoatrial junction.  IMPRESSION: Bibasilar atelectasis.   Electronically Signed   By:  Marybelle Killings M.D.   On: 07/21/2014 14:32    Review of Systems  Constitutional: Positive for chills. Negative for fever.  Respiratory: Positive for shortness of breath. Negative for cough.   Cardiovascular: Negative for chest pain and orthopnea.  Gastrointestinal: Negative for nausea and vomiting.  Neurological: Positive for dizziness and tremors.   Blood pressure 130/60, pulse 87, temperature 98.5 F (36.9 C), temperature source Oral, resp. rate 20, height $RemoveBe'5\' 8"'IBMxvqFlh$  (1.727 m), weight 96.1 kg (211 lb 13.8 oz), SpO2 98 %. Physical Exam  Constitutional: He is oriented to person, place, and time. No distress.  Eyes: No scleral icterus.  Neck: No JVD present.  Cardiovascular: Normal rate and regular rhythm.   No murmur heard. Respiratory: No respiratory distress. He has no wheezes.  GI: He exhibits no distension. There is no tenderness.  Musculoskeletal: He exhibits no edema.  Neurological: He is alert and oriented to person, place, and time.    Assessment/Plan: Problem #1 shaking and chills. Presently thought to be from urinary tract infection as patient has nephrostomy tube with drainage. However catheter related infection at this moment cannot be ruled out. Patient on antibiotics and feels much better. His white blood cell count has normalized and blood culture still pending. Problem #2 end-stage renal disease: His status post hemodialysis yesterday for 1 hour and half. Presently patient does not have any uremic sinus symptoms and his potassium is good Problem #3 anemia: His hemoglobin and hematocrit has gone down below target goal. Problem #4 hypertension: Patient is not on any antihypertensive medication presently and his blood pressure seems to be within acceptable range. Problem #5 history of cancer of the bladder and colon being followed at [facility] Problem #6 history of diabetes Problem #7 history of CVA Plan: 1] Patient does not require dialysis today 2] We'll make arrangements for patient  to get dialysis tomorrow 3] Agree with holding his antihypertensive medications. 4] We'll DC the fluid 5] We'll check basic metabolic panel and CBC in the morning.  Sigmund Morera S 07/22/2014, 9:29 AM

## 2014-07-23 DIAGNOSIS — N186 End stage renal disease: Secondary | ICD-10-CM | POA: Diagnosis not present

## 2014-07-23 DIAGNOSIS — Z992 Dependence on renal dialysis: Secondary | ICD-10-CM | POA: Diagnosis not present

## 2014-07-23 LAB — CBC
HCT: 31.5 % — ABNORMAL LOW (ref 39.0–52.0)
Hemoglobin: 10.2 g/dL — ABNORMAL LOW (ref 13.0–17.0)
MCH: 30.4 pg (ref 26.0–34.0)
MCHC: 32.4 g/dL (ref 30.0–36.0)
MCV: 94 fL (ref 78.0–100.0)
Platelets: 262 10*3/uL (ref 150–400)
RBC: 3.35 MIL/uL — AB (ref 4.22–5.81)
RDW: 14.5 % (ref 11.5–15.5)
WBC: 10.9 10*3/uL — AB (ref 4.0–10.5)

## 2014-07-23 LAB — GLUCOSE, CAPILLARY
GLUCOSE-CAPILLARY: 143 mg/dL — AB (ref 70–99)
GLUCOSE-CAPILLARY: 166 mg/dL — AB (ref 70–99)
GLUCOSE-CAPILLARY: 103 mg/dL — AB (ref 70–99)

## 2014-07-23 LAB — BASIC METABOLIC PANEL
Anion gap: 19 — ABNORMAL HIGH (ref 5–15)
BUN: 65 mg/dL — ABNORMAL HIGH (ref 6–23)
CHLORIDE: 98 mmol/L (ref 96–112)
CO2: 20 mmol/L (ref 19–32)
CREATININE: 8.32 mg/dL — AB (ref 0.50–1.35)
Calcium: 9.1 mg/dL (ref 8.4–10.5)
GFR calc non Af Amer: 5 mL/min — ABNORMAL LOW (ref >90)
GFR, EST AFRICAN AMERICAN: 6 mL/min — AB (ref >90)
Glucose, Bld: 152 mg/dL — ABNORMAL HIGH (ref 70–99)
POTASSIUM: 4.1 mmol/L (ref 3.5–5.1)
SODIUM: 137 mmol/L (ref 135–145)

## 2014-07-23 LAB — HEMOGLOBIN A1C
HEMOGLOBIN A1C: 7.7 % — AB (ref 4.8–5.6)
MEAN PLASMA GLUCOSE: 174 mg/dL

## 2014-07-23 NOTE — Progress Notes (Signed)
Patient returned from dialysis. Alert and oriented. Tachycardic at 122 and temp 100.7. Dr. Jerilee Hoh notified. Will discharge at this time.

## 2014-07-23 NOTE — Progress Notes (Signed)
Subjective: Interval History: has no complaint of fever chills or sweating. Patient feels much better. He denies also any dizziness.  Objective: Vital signs in last 24 hours: Temp:  [98.2 F (36.8 C)-99.9 F (37.7 C)] 99.9 F (37.7 C) (02/29 0700) Pulse Rate:  [87-98] 98 (02/29 0700) Resp:  [16-20] 18 (02/29 0700) BP: (130-157)/(60-64) 151/60 mmHg (02/29 0700) SpO2:  [98 %-99 %] 99 % (02/29 0700) Weight:  [94.802 kg (209 lb)-95.2 kg (209 lb 14.1 oz)] 94.802 kg (209 lb) (02/29 0700) Weight change: -0.055 kg (-2 oz)  Intake/Output from previous day: 02/28 0701 - 02/29 0700 In: 3 [I.V.:3] Out: 850  Intake/Output this shift:    General appearance: alert, cooperative and no distress Resp: clear to auscultation bilaterally Cardio: regular rate and rhythm, S1, S2 normal, no murmur, click, rub or gallop GI: soft, non-tender; bowel sounds normal; no masses,  no organomegaly Extremities: extremities normal, atraumatic, no cyanosis or edema  Lab Results:  Recent Labs  07/22/14 0556 07/23/14 0555  WBC 8.2 10.9*  HGB 8.9* 10.2*  HCT 27.2* 31.5*  PLT 190 262   BMET:  Recent Labs  07/22/14 0556 07/23/14 0555  NA 133* 137  K 3.5 4.1  CL 100 98  CO2 22 20  GLUCOSE 162* 152*  BUN 49* 65*  CREATININE 7.12* 8.32*  CALCIUM 8.3* 9.1   No results for input(s): PTH in the last 72 hours. Iron Studies: No results for input(s): IRON, TIBC, TRANSFERRIN, FERRITIN in the last 72 hours.  Studies/Results: Dg Chest Portable 1 View  07/21/2014   CLINICAL DATA:  Tremors  EXAM: PORTABLE CHEST - 1 VIEW  COMPARISON:  06/08/2013  FINDINGS: Normal heart size. Low lung volumes. Bibasilar atelectasis. Upper lungs clear. Normal vascularity. Right internal jugular dialysis catheter in place with its tip at the cavoatrial junction.  IMPRESSION: Bibasilar atelectasis.   Electronically Signed   By: Marybelle Killings M.D.   On: 07/21/2014 14:32    I have reviewed the patient's current  medications.  Assessment/Plan: Problem #1 history of shaking chills. Possibly from infection. Presently patient is asymptomatic. Blood culture is pending. Patient is presently on antibiotics. Problem #2 end-stage renal disease: Status post short dialysis on Saturday. Presently patient does not have any uremic signs and symptoms. His potassium is good. Problem #3 anemia: His hemoglobin is within our target goal. Problem #4 history of hypertension: His blood pressure is reasonably controlled Problem #5 metabolic bone disease: His calcium is in range Problem #6 history of bladder cancer: Status post nephrostomy tube placement. Problem #7 diabetes Plan: We'll make arrangements for patient to get dialysis today. We'll continue his antibiotics and follow his blood culture.    LOS: 2 days   Torrey Ballinas S 07/23/2014,7:49 AM    and

## 2014-07-23 NOTE — Discharge Summary (Signed)
Physician Discharge Summary  [name]:[id number] DOB: 1932/10/30 DOA: 07/21/2014  PCP: Glo Herring., MD  Admit date: 07/21/2014 Discharge date: 07/23/2014  Time spent: 45 minutes  Recommendations for Outpatient Follow-up:  -Will be discharged home today.   Discharge Diagnoses:  Principal Problem:   Sepsis Active Problems:   HLD (hyperlipidemia)   Essential hypertension   DM (diabetes mellitus), type 2 with renal complications   Near syncope   Weakness   History of bladder cancer   History of stroke   ESRD on dialysis   GERD (gastroesophageal reflux disease)   Discharge Condition: Stable and improved  Filed Weights   07/22/14 2111 07/23/14 0700 07/23/14 1135  Weight: 95.2 kg (209 lb 14.1 oz) 94.802 kg (209 lb) 94.8 kg (208 lb 15.9 oz)    History of present illness:  Craig Castillo is a 81 y.o. male  Tremors: Onset during dialysis today. Associated with feelings of dizziness described as room spinning. Associated with general "ill feeling "and weakness. Symptoms are constant and getting worse. Patient denies fevers, chest pain, palpitations, abdominal pain, nausea, vomiting, diarrhea. Dizziness resolved before presentation to emergency room. Patient had to stop dialysis early. Similar episodes over the last 2 weeks typically during dialysis. This episode significantly worse than all previous episodes. Told that previous episodes were due to low sugar. Patient denies missing any dialysis recently and started dialysis 2 months ago. Nephrostomy tubes changed 1 month ago per patient. No tenderness at ostomy sites.   Hospital Course:   Tremors -Likely dialysis induced. -No sign of infection, not uremic on presentation. -Happens frequently while being dialyzed. -Resolved.  UTI/Chronic Colonization -With indwelling suprapubic catheters. -Cx data remains negative. -Will elect not to treat as he has chronic colonization due to his suprapubic  catheters.  ESRD -As per renal. -For HD today.  HTN -Well controlled.  DM -Fair control. -No medication changes.  HLD -Continue statin/zetia  GERD -Continue PPI.  CAD -Stable, no CP. -Continue ASA/Plavix  Procedures:  HD as scheduled   Consultations:  Renal, Dr. Lowanda Foster  Discharge Instructions  Discharge Instructions    Diet - low sodium heart healthy    Complete by:  As directed      Increase activity slowly    Complete by:  As directed             Medication List    STOP taking these medications        calcitRIOL 0.25 MCG capsule  Commonly known as:  ROCALTROL     furosemide 40 MG tablet  Commonly known as:  LASIX     hydrochlorothiazide 25 MG tablet  Commonly known as:  HYDRODIURIL     insulin NPH Human 100 UNIT/ML injection  Commonly known as:  HUMULIN N,NOVOLIN N      TAKE these medications        amLODipine 5 MG tablet  Commonly known as:  NORVASC  Take 5 mg by mouth daily.     aspirin EC 81 MG tablet  Take 81 mg by mouth daily.     atorvastatin 20 MG tablet  Commonly known as:  LIPITOR  Take 20 mg by mouth daily.     clopidogrel 75 MG tablet  Commonly known as:  PLAVIX  Take 75 mg by mouth daily.     ezetimibe 10 MG tablet  Commonly known as:  ZETIA  Take 10 mg by mouth every morning.     gemfibrozil 600 MG tablet  Commonly  known as:  LOPID  Take 600 mg by mouth 2 (two) times daily before a meal.     isosorbide mononitrate 60 MG 24 hr tablet  Commonly known as:  IMDUR  Take 1 tablet (60 mg total) by mouth daily.     metoprolol tartrate 25 MG tablet  Commonly known as:  LOPRESSOR  TAKE 1/2 TABLET BY MOUTH TWICE DAILY.     nitroGLYCERIN 0.4 MG SL tablet  Commonly known as:  NITROSTAT  Place 1 tablet (0.4 mg total) under the tongue every 5 (five) minutes x 3 doses as needed for chest pain. Chest Pain     omeprazole 20 MG capsule  Commonly known as:  PRILOSEC  Take 20 mg by mouth every morning.     tamsulosin 0.4  MG Caps capsule  Commonly known as:  FLOMAX  Take 0.4 mg by mouth.       Allergies  Allergen Reactions  . Aspirin     Due to swelling with celecoxib  . Celecoxib Swelling  . Hydrocodone-Acetaminophen Itching  . Lasix [Furosemide] Other (See Comments)    Unknown   . Morphine Itching  . Niacin Itching  . Piroxicam Itching      The results of significant diagnostics from this hospitalization (including imaging, microbiology, ancillary and laboratory) are listed below for reference.    Significant Diagnostic Studies: Dg Chest Portable 1 View  07/21/2014   CLINICAL DATA:  Tremors  EXAM: PORTABLE CHEST - 1 VIEW  COMPARISON:  06/08/2013  FINDINGS: Normal heart size. Low lung volumes. Bibasilar atelectasis. Upper lungs clear. Normal vascularity. Right internal jugular dialysis catheter in place with its tip at the cavoatrial junction.  IMPRESSION: Bibasilar atelectasis.   Electronically Signed   By: Marybelle Killings M.D.   On: 07/21/2014 14:32    Microbiology: Recent Results (from the past 240 hour(s))  Culture, blood (x 2)     Status: None (Preliminary result)   Collection Time: 07/21/14  5:29 PM  Result Value Ref Range Status   Specimen Description RIGHT ANTECUBITAL  Final   Special Requests BOTTLES DRAWN AEROBIC AND ANAEROBIC 6CC  Final   Culture NO GROWTH 2 DAYS  Final   Report Status PENDING  Incomplete  Culture, blood (x 2)     Status: None (Preliminary result)   Collection Time: 07/21/14  5:29 PM  Result Value Ref Range Status   Specimen Description RIGHT ANTECUBITAL  Final   Special Requests BOTTLES DRAWN AEROBIC AND ANAEROBIC 6CC  Final   Culture NO GROWTH 2 DAYS  Final   Report Status PENDING  Incomplete  MRSA PCR Screening     Status: None   Collection Time: 07/21/14  6:15 PM  Result Value Ref Range Status   MRSA by PCR NEGATIVE NEGATIVE Final    Comment:        The GeneXpert MRSA Assay (FDA approved for NASAL specimens only), is one component of a comprehensive  MRSA colonization surveillance program. It is not intended to diagnose MRSA infection nor to guide or monitor treatment for MRSA infections.      Labs: Basic Metabolic Panel:  Recent Labs Lab 07/21/14 1348 07/22/14 0556 07/23/14 0555  NA 136 133* 137  K 3.3* 3.5 4.1  CL 96 100 98  CO2 27 22 20   GLUCOSE 163* 162* 152*  BUN 39* 49* 65*  CREATININE 5.88* 7.12* 8.32*  CALCIUM 8.7 8.3* 9.1   Liver Function Tests:  Recent Labs Lab 07/21/14 1348 07/22/14 0556  AST 13  12  ALT 5 6  ALKPHOS 78 61  BILITOT 0.8 0.6  PROT 7.4 6.6  ALBUMIN 3.1* 2.6*   No results for input(s): LIPASE, AMYLASE in the last 168 hours. No results for input(s): AMMONIA in the last 168 hours. CBC:  Recent Labs Lab 07/21/14 1348 07/22/14 0556 07/23/14 0555  WBC 11.6* 8.2 10.9*  NEUTROABS 10.9*  --   --   HGB 10.1* 8.9* 10.2*  HCT 31.1* 27.2* 31.5*  MCV 92.8 93.5 94.0  PLT 216 190 262   Cardiac Enzymes:  Recent Labs Lab 07/21/14 1348 07/21/14 1729  TROPONINI 0.03 0.06*   BNP: BNP (last 3 results)  Recent Labs  07/21/14 1348  BNP 142.0*    ProBNP (last 3 results) No results for input(s): PROBNP in the last 8760 hours.  CBG:  Recent Labs Lab 07/22/14 1136 07/22/14 1656 07/22/14 2142 07/23/14 0729 07/23/14 1104  GLUCAP 185* 167* 140* 143* 166*       Signed:  HERNANDEZ ACOSTA,ESTELA  [facility] Pager: [phone] 07/23/2014, 3:47 PM

## 2014-07-23 NOTE — Care Management Note (Signed)
    Page 1 of 1   07/23/2014     12:50:45 PM CARE MANAGEMENT NOTE 07/23/2014  Patient:  Craig Castillo, Craig Castillo   Account Number:  [id number]  Date Initiated:  07/23/2014  Documentation initiated by:  Theophilus Kinds  Subjective/Objective Assessment:   Pt admitted from home with sepsis. Pt lives with his wife and will return home at discharge. Pt receives dialysis at [facility] in [place]. Pt uses a cane for prn use.     Action/Plan:   Pt discharged home today. No CM needs noted.   Anticipated DC Date:  07/23/2014   Anticipated DC Plan:  [place]  CM consult      Choice offered to / List presented to:             Status of service:  Completed, signed off Medicare Important Message given?  NA - LOS <3 / Initial given by admissions (If response is "NO", the following Medicare IM given date fields will be blank) Date Medicare IM given:   Medicare IM given by:   Date Additional Medicare IM given:   Additional Medicare IM given by:    Discharge Disposition:  HOME/SELF CARE  Per UR Regulation:    If discussed at Long Length of Stay Meetings, dates discussed:    Comments:  07/23/14 [place], RN BSN CM

## 2014-07-23 NOTE — Progress Notes (Signed)
Patient transported to dialysis via chair.

## 2014-07-23 NOTE — Progress Notes (Signed)
Patient up to chair. Tolerating well. No complaints voiced at this time. 

## 2014-07-23 NOTE — Progress Notes (Signed)
UR chart review completed.  

## 2014-07-24 DIAGNOSIS — Z992 Dependence on renal dialysis: Secondary | ICD-10-CM | POA: Diagnosis not present

## 2014-07-24 DIAGNOSIS — N2581 Secondary hyperparathyroidism of renal origin: Secondary | ICD-10-CM | POA: Diagnosis not present

## 2014-07-24 DIAGNOSIS — D631 Anemia in chronic kidney disease: Secondary | ICD-10-CM | POA: Diagnosis not present

## 2014-07-24 DIAGNOSIS — D509 Iron deficiency anemia, unspecified: Secondary | ICD-10-CM | POA: Diagnosis not present

## 2014-07-24 DIAGNOSIS — N186 End stage renal disease: Secondary | ICD-10-CM | POA: Diagnosis not present

## 2014-07-25 DIAGNOSIS — N1330 Unspecified hydronephrosis: Secondary | ICD-10-CM | POA: Diagnosis not present

## 2014-07-25 DIAGNOSIS — Z436 Encounter for attention to other artificial openings of urinary tract: Secondary | ICD-10-CM | POA: Diagnosis not present

## 2014-07-25 DIAGNOSIS — Z466 Encounter for fitting and adjustment of urinary device: Secondary | ICD-10-CM | POA: Diagnosis not present

## 2014-07-26 DIAGNOSIS — D631 Anemia in chronic kidney disease: Secondary | ICD-10-CM | POA: Diagnosis not present

## 2014-07-26 DIAGNOSIS — N2581 Secondary hyperparathyroidism of renal origin: Secondary | ICD-10-CM | POA: Diagnosis not present

## 2014-07-26 DIAGNOSIS — N186 End stage renal disease: Secondary | ICD-10-CM | POA: Diagnosis not present

## 2014-07-26 DIAGNOSIS — Z992 Dependence on renal dialysis: Secondary | ICD-10-CM | POA: Diagnosis not present

## 2014-07-26 DIAGNOSIS — D509 Iron deficiency anemia, unspecified: Secondary | ICD-10-CM | POA: Diagnosis not present

## 2014-07-26 LAB — CULTURE, BLOOD (ROUTINE X 2)
Culture: NO GROWTH
Culture: NO GROWTH

## 2014-07-28 DIAGNOSIS — N2581 Secondary hyperparathyroidism of renal origin: Secondary | ICD-10-CM | POA: Diagnosis not present

## 2014-07-28 DIAGNOSIS — N186 End stage renal disease: Secondary | ICD-10-CM | POA: Diagnosis not present

## 2014-07-28 DIAGNOSIS — Z992 Dependence on renal dialysis: Secondary | ICD-10-CM | POA: Diagnosis not present

## 2014-07-28 DIAGNOSIS — D509 Iron deficiency anemia, unspecified: Secondary | ICD-10-CM | POA: Diagnosis not present

## 2014-07-28 DIAGNOSIS — D631 Anemia in chronic kidney disease: Secondary | ICD-10-CM | POA: Diagnosis not present

## 2014-07-31 DIAGNOSIS — N186 End stage renal disease: Secondary | ICD-10-CM | POA: Diagnosis not present

## 2014-07-31 DIAGNOSIS — Z992 Dependence on renal dialysis: Secondary | ICD-10-CM | POA: Diagnosis not present

## 2014-07-31 DIAGNOSIS — D631 Anemia in chronic kidney disease: Secondary | ICD-10-CM | POA: Diagnosis not present

## 2014-07-31 DIAGNOSIS — D509 Iron deficiency anemia, unspecified: Secondary | ICD-10-CM | POA: Diagnosis not present

## 2014-07-31 DIAGNOSIS — N2581 Secondary hyperparathyroidism of renal origin: Secondary | ICD-10-CM | POA: Diagnosis not present

## 2014-08-02 DIAGNOSIS — N2581 Secondary hyperparathyroidism of renal origin: Secondary | ICD-10-CM | POA: Diagnosis not present

## 2014-08-02 DIAGNOSIS — Z992 Dependence on renal dialysis: Secondary | ICD-10-CM | POA: Diagnosis not present

## 2014-08-02 DIAGNOSIS — D631 Anemia in chronic kidney disease: Secondary | ICD-10-CM | POA: Diagnosis not present

## 2014-08-02 DIAGNOSIS — N186 End stage renal disease: Secondary | ICD-10-CM | POA: Diagnosis not present

## 2014-08-02 DIAGNOSIS — D509 Iron deficiency anemia, unspecified: Secondary | ICD-10-CM | POA: Diagnosis not present

## 2014-08-03 DIAGNOSIS — Z436 Encounter for attention to other artificial openings of urinary tract: Secondary | ICD-10-CM | POA: Diagnosis not present

## 2014-08-03 DIAGNOSIS — Z6831 Body mass index (BMI) 31.0-31.9, adult: Secondary | ICD-10-CM | POA: Diagnosis not present

## 2014-08-04 DIAGNOSIS — D631 Anemia in chronic kidney disease: Secondary | ICD-10-CM | POA: Diagnosis not present

## 2014-08-04 DIAGNOSIS — D509 Iron deficiency anemia, unspecified: Secondary | ICD-10-CM | POA: Diagnosis not present

## 2014-08-04 DIAGNOSIS — N2581 Secondary hyperparathyroidism of renal origin: Secondary | ICD-10-CM | POA: Diagnosis not present

## 2014-08-04 DIAGNOSIS — N186 End stage renal disease: Secondary | ICD-10-CM | POA: Diagnosis not present

## 2014-08-04 DIAGNOSIS — Z992 Dependence on renal dialysis: Secondary | ICD-10-CM | POA: Diagnosis not present

## 2014-08-06 DIAGNOSIS — N186 End stage renal disease: Secondary | ICD-10-CM | POA: Diagnosis not present

## 2014-08-06 DIAGNOSIS — Z992 Dependence on renal dialysis: Secondary | ICD-10-CM | POA: Diagnosis not present

## 2014-08-06 DIAGNOSIS — T82858D Stenosis of vascular prosthetic devices, implants and grafts, subsequent encounter: Secondary | ICD-10-CM | POA: Diagnosis not present

## 2014-08-06 DIAGNOSIS — I871 Compression of vein: Secondary | ICD-10-CM | POA: Diagnosis not present

## 2014-08-07 DIAGNOSIS — D509 Iron deficiency anemia, unspecified: Secondary | ICD-10-CM | POA: Diagnosis not present

## 2014-08-07 DIAGNOSIS — Z992 Dependence on renal dialysis: Secondary | ICD-10-CM | POA: Diagnosis not present

## 2014-08-07 DIAGNOSIS — N2581 Secondary hyperparathyroidism of renal origin: Secondary | ICD-10-CM | POA: Diagnosis not present

## 2014-08-07 DIAGNOSIS — D631 Anemia in chronic kidney disease: Secondary | ICD-10-CM | POA: Diagnosis not present

## 2014-08-07 DIAGNOSIS — N186 End stage renal disease: Secondary | ICD-10-CM | POA: Diagnosis not present

## 2014-08-09 DIAGNOSIS — D509 Iron deficiency anemia, unspecified: Secondary | ICD-10-CM | POA: Diagnosis not present

## 2014-08-09 DIAGNOSIS — N2581 Secondary hyperparathyroidism of renal origin: Secondary | ICD-10-CM | POA: Diagnosis not present

## 2014-08-09 DIAGNOSIS — N186 End stage renal disease: Secondary | ICD-10-CM | POA: Diagnosis not present

## 2014-08-09 DIAGNOSIS — Z992 Dependence on renal dialysis: Secondary | ICD-10-CM | POA: Diagnosis not present

## 2014-08-09 DIAGNOSIS — D631 Anemia in chronic kidney disease: Secondary | ICD-10-CM | POA: Diagnosis not present

## 2014-08-11 DIAGNOSIS — N186 End stage renal disease: Secondary | ICD-10-CM | POA: Diagnosis not present

## 2014-08-11 DIAGNOSIS — D631 Anemia in chronic kidney disease: Secondary | ICD-10-CM | POA: Diagnosis not present

## 2014-08-11 DIAGNOSIS — D509 Iron deficiency anemia, unspecified: Secondary | ICD-10-CM | POA: Diagnosis not present

## 2014-08-11 DIAGNOSIS — Z992 Dependence on renal dialysis: Secondary | ICD-10-CM | POA: Diagnosis not present

## 2014-08-11 DIAGNOSIS — N2581 Secondary hyperparathyroidism of renal origin: Secondary | ICD-10-CM | POA: Diagnosis not present

## 2014-08-14 DIAGNOSIS — N186 End stage renal disease: Secondary | ICD-10-CM | POA: Diagnosis not present

## 2014-08-14 DIAGNOSIS — D509 Iron deficiency anemia, unspecified: Secondary | ICD-10-CM | POA: Diagnosis not present

## 2014-08-14 DIAGNOSIS — Z992 Dependence on renal dialysis: Secondary | ICD-10-CM | POA: Diagnosis not present

## 2014-08-14 DIAGNOSIS — N2581 Secondary hyperparathyroidism of renal origin: Secondary | ICD-10-CM | POA: Diagnosis not present

## 2014-08-14 DIAGNOSIS — D631 Anemia in chronic kidney disease: Secondary | ICD-10-CM | POA: Diagnosis not present

## 2014-08-16 DIAGNOSIS — N186 End stage renal disease: Secondary | ICD-10-CM | POA: Diagnosis not present

## 2014-08-16 DIAGNOSIS — Z992 Dependence on renal dialysis: Secondary | ICD-10-CM | POA: Diagnosis not present

## 2014-08-16 DIAGNOSIS — N2581 Secondary hyperparathyroidism of renal origin: Secondary | ICD-10-CM | POA: Diagnosis not present

## 2014-08-16 DIAGNOSIS — D631 Anemia in chronic kidney disease: Secondary | ICD-10-CM | POA: Diagnosis not present

## 2014-08-16 DIAGNOSIS — D509 Iron deficiency anemia, unspecified: Secondary | ICD-10-CM | POA: Diagnosis not present

## 2014-08-18 DIAGNOSIS — D509 Iron deficiency anemia, unspecified: Secondary | ICD-10-CM | POA: Diagnosis not present

## 2014-08-18 DIAGNOSIS — Z992 Dependence on renal dialysis: Secondary | ICD-10-CM | POA: Diagnosis not present

## 2014-08-18 DIAGNOSIS — N186 End stage renal disease: Secondary | ICD-10-CM | POA: Diagnosis not present

## 2014-08-18 DIAGNOSIS — N2581 Secondary hyperparathyroidism of renal origin: Secondary | ICD-10-CM | POA: Diagnosis not present

## 2014-08-18 DIAGNOSIS — D631 Anemia in chronic kidney disease: Secondary | ICD-10-CM | POA: Diagnosis not present

## 2014-08-20 ENCOUNTER — Other Ambulatory Visit: Payer: Self-pay | Admitting: Cardiology

## 2014-08-20 MED ORDER — ISOSORBIDE MONONITRATE ER 60 MG PO TB24: 60.0000 mg | ORAL_TABLET | Freq: Every day | ORAL | Status: DC

## 2014-08-20 NOTE — Telephone Encounter (Signed)
esrcibed refill

## 2014-08-20 NOTE — Telephone Encounter (Signed)
Received fax refill request  Rx # [id number] Medication:  Isosobide MN 60mg  Tab SA Qty 30 tab Sig:  Take 1 tablet by mouth twice daily Physician:  Carlyle Dolly

## 2014-08-21 DIAGNOSIS — Z992 Dependence on renal dialysis: Secondary | ICD-10-CM | POA: Diagnosis not present

## 2014-08-21 DIAGNOSIS — N2581 Secondary hyperparathyroidism of renal origin: Secondary | ICD-10-CM | POA: Diagnosis not present

## 2014-08-21 DIAGNOSIS — D509 Iron deficiency anemia, unspecified: Secondary | ICD-10-CM | POA: Diagnosis not present

## 2014-08-21 DIAGNOSIS — D631 Anemia in chronic kidney disease: Secondary | ICD-10-CM | POA: Diagnosis not present

## 2014-08-21 DIAGNOSIS — N186 End stage renal disease: Secondary | ICD-10-CM | POA: Diagnosis not present

## 2014-08-22 DIAGNOSIS — Z466 Encounter for fitting and adjustment of urinary device: Secondary | ICD-10-CM | POA: Diagnosis not present

## 2014-08-22 DIAGNOSIS — Z436 Encounter for attention to other artificial openings of urinary tract: Secondary | ICD-10-CM | POA: Diagnosis not present

## 2014-08-22 DIAGNOSIS — Z936 Other artificial openings of urinary tract status: Secondary | ICD-10-CM | POA: Diagnosis not present

## 2014-08-23 DIAGNOSIS — D631 Anemia in chronic kidney disease: Secondary | ICD-10-CM | POA: Diagnosis not present

## 2014-08-23 DIAGNOSIS — N2581 Secondary hyperparathyroidism of renal origin: Secondary | ICD-10-CM | POA: Diagnosis not present

## 2014-08-23 DIAGNOSIS — N186 End stage renal disease: Secondary | ICD-10-CM | POA: Diagnosis not present

## 2014-08-23 DIAGNOSIS — Z992 Dependence on renal dialysis: Secondary | ICD-10-CM | POA: Diagnosis not present

## 2014-08-23 DIAGNOSIS — D509 Iron deficiency anemia, unspecified: Secondary | ICD-10-CM | POA: Diagnosis not present

## 2014-08-25 DIAGNOSIS — N186 End stage renal disease: Secondary | ICD-10-CM | POA: Diagnosis not present

## 2014-08-25 DIAGNOSIS — D509 Iron deficiency anemia, unspecified: Secondary | ICD-10-CM | POA: Diagnosis not present

## 2014-08-25 DIAGNOSIS — Z992 Dependence on renal dialysis: Secondary | ICD-10-CM | POA: Diagnosis not present

## 2014-08-25 DIAGNOSIS — N2581 Secondary hyperparathyroidism of renal origin: Secondary | ICD-10-CM | POA: Diagnosis not present

## 2014-08-25 DIAGNOSIS — D631 Anemia in chronic kidney disease: Secondary | ICD-10-CM | POA: Diagnosis not present

## 2014-08-28 ENCOUNTER — Encounter: Payer: Self-pay | Admitting: Vascular Surgery

## 2014-08-28 DIAGNOSIS — N186 End stage renal disease: Secondary | ICD-10-CM | POA: Diagnosis not present

## 2014-08-28 DIAGNOSIS — D509 Iron deficiency anemia, unspecified: Secondary | ICD-10-CM | POA: Diagnosis not present

## 2014-08-28 DIAGNOSIS — N2581 Secondary hyperparathyroidism of renal origin: Secondary | ICD-10-CM | POA: Diagnosis not present

## 2014-08-28 DIAGNOSIS — D631 Anemia in chronic kidney disease: Secondary | ICD-10-CM | POA: Diagnosis not present

## 2014-08-28 DIAGNOSIS — Z992 Dependence on renal dialysis: Secondary | ICD-10-CM | POA: Diagnosis not present

## 2014-08-29 ENCOUNTER — Ambulatory Visit (INDEPENDENT_AMBULATORY_CARE_PROVIDER_SITE_OTHER)
Admission: RE | Admit: 2014-08-29 | Discharge: 2014-08-29 | Disposition: A | Discharge status: Home / Self Care | Payer: Medicare Other | Source: Ambulatory Visit | Attending: Vascular Surgery | Admitting: Vascular Surgery

## 2014-08-29 ENCOUNTER — Ambulatory Visit (INDEPENDENT_AMBULATORY_CARE_PROVIDER_SITE_OTHER): Payer: Medicare Other | Admitting: Vascular Surgery

## 2014-08-29 ENCOUNTER — Ambulatory Visit (HOSPITAL_COMMUNITY)
Admission: RE | Admit: 2014-08-29 | Discharge: 2014-08-29 | Disposition: A | Discharge status: Home / Self Care | Payer: Medicare Other | Source: Ambulatory Visit | Attending: Vascular Surgery | Admitting: Vascular Surgery

## 2014-08-29 ENCOUNTER — Encounter: Payer: Self-pay | Admitting: Vascular Surgery

## 2014-08-29 ENCOUNTER — Other Ambulatory Visit: Payer: Self-pay

## 2014-08-29 VITALS — BP 128/79 | HR 88 | Ht 68.0 in | Wt 210.0 lb

## 2014-08-29 DIAGNOSIS — Z01810 Encounter for preprocedural cardiovascular examination: Secondary | ICD-10-CM

## 2014-08-29 DIAGNOSIS — N185 Chronic kidney disease, stage 5: Secondary | ICD-10-CM | POA: Insufficient documentation

## 2014-08-29 DIAGNOSIS — Z992 Dependence on renal dialysis: Secondary | ICD-10-CM

## 2014-08-29 DIAGNOSIS — N186 End stage renal disease: Secondary | ICD-10-CM | POA: Diagnosis not present

## 2014-08-29 NOTE — Progress Notes (Signed)
[facility] [place] HISTORY AND PHYSICAL   History of Present Illness:  Patient is a 81 y.o. year old male who presents for placement of a permanent hemodialysis access. The patient is right handed .  The patient is currently on hemodialysis.  The cause of renal failure is thought to be secondary to bladder cancer.  Other chronic medical problems include hypertension, hyperlipidemia, stroke and CAD.  He currently takes Plavix,metoprolol, Lipitor  And 81 mg Asprin daily.  Past Medical History  Diagnosis Date  . Dyslipidemia   . DJD (degenerative joint disease)   . Hypertension   . Diabetes mellitus   . BPH (benign prostatic hyperplasia)   . CKD (chronic kidney disease) stage 3, GFR 30-59 ml/min   . Coronary artery disease     a. 1997 s/p MI and prior RCA stenting;  b. 04/2013 MV: inflat ischemia;  c. 05/2013 Cath/PCI: LM 10-20, LAD 30-40p, LCX 95p (3.0x15 Xience DES), 36m, OM1/2 30, RCA 50p diff ISR, 90d (med Rx).  . Bladder cancer   . Stroke     Past Surgical History  Procedure Laterality Date  . Cholecystectomy    . Esophagogastroduodenoscopy  06/08/2002    Soft stricture of the GE  junction with erosive esophagitis. The stricture was dilated to 10 [place] using a Maloney dilator. Swollen fold at GE junction on the gastric site which was biopsied for histology and was suspicious for sentinel folds.  . Colonoscopy  06/08/2002    Small polyp was oblated via cold biopsy from the cecum and two were snared, one was at the rectosigmoid junction measuring about a centimeter, another one at the rectum which was smaller.  . Transurethral resection of bladder tumor      x7  . Coronary angioplasty  1997    3 stents  . Transurethral resection of bladder tumor  03/15/2012    Procedure: TRANSURETHRAL RESECTION OF BLADDER TUMOR (TURBT);  Surgeon: Marissa Nestle, MD;  Location: [facility];  Service: Urology;  Laterality: N/A;  . Cataract extraction w/phaco  06/20/2012   Procedure: CATARACT EXTRACTION PHACO AND INTRAOCULAR LENS PLACEMENT (IOC);  Surgeon: Tonny Branch, MD;  Location: [facility];  Service: Ophthalmology;  Laterality: Right;  CDE=16.59  . Cataract extraction w/phaco Left 07/04/2012    Procedure: CATARACT EXTRACTION PHACO AND INTRAOCULAR LENS PLACEMENT (IOC);  Surgeon: Tonny Branch, MD;  Location: [facility];  Service: Ophthalmology;  Laterality: Left;  CDE: 24.33  . Transurethral resection of bladder tumor N/A 09/29/2012    Procedure: TRANSURETHRAL RESECTION OF BLADDER TUMOR (TURBT);  Surgeon: Marissa Nestle, MD;  Location: [facility];  Service: Urology;  Laterality: N/A;  . Multiple tooth extractions    . Cystoscopy with biopsy N/A 04/27/2013    Procedure: CYSTOSCOPY WITH BLADDER BIOPSY & FULGURATION;  Surgeon: Marissa Nestle, MD;  Location: [facility];  Service: Urology;  Laterality: N/A;  . Left heart catheterization with coronary angiogram N/A 06/09/2013    Procedure: LEFT HEART CATHETERIZATION WITH CORONARY ANGIOGRAM;  Surgeon: Peter M Martinique, MD;  Location: [facility] CATH LAB;  Service: Cardiovascular;  Laterality: N/A;  . Percutaneous coronary rotoblator intervention (pci-r) N/A 06/12/2013    Procedure: PERCUTANEOUS CORONARY ROTOBLATOR INTERVENTION (PCI-R);  Surgeon: Blane Ohara, MD;  [facility] LAB;  Service: Cardiovascular;  Laterality: N/A;     Social History History  Substance Use Topics  . Smoking status: Former Smoker -- 0.50 packs/day for 20 years    Types: Cigarettes    Quit date:  03/08/1981  . Smokeless tobacco: Never Used  . Alcohol Use: No    Family History Family History  Problem Relation Age of Onset  . Stroke Mother 24  . Diabetes Brother 14  . Heart disease Father   . Heart attack Father     Allergies  Allergies  Allergen Reactions  . Aspirin     Due to swelling with celecoxib  . Celecoxib Swelling  . Hydrocodone-Acetaminophen Itching  . Lasix [Furosemide] Other (See Comments)    Unknown   . Morphine Itching  . Niacin  Itching  . Piroxicam Itching     Current Outpatient Prescriptions  Medication Sig Dispense Refill  . amLODipine (NORVASC) 5 MG tablet Take 5 mg by mouth daily.    Marland Kitchen aspirin EC 81 MG tablet Take 81 mg by mouth daily.    Marland Kitchen atorvastatin (LIPITOR) 20 MG tablet Take 20 mg by mouth daily.    . clopidogrel (PLAVIX) 75 MG tablet Take 75 mg by mouth daily.    Marland Kitchen ezetimibe (ZETIA) 10 MG tablet Take 10 mg by mouth every morning.     . furosemide (LASIX) 40 MG tablet Take 40 mg by mouth daily.     Marland Kitchen gemfibrozil (LOPID) 600 MG tablet Take 600 mg by mouth 2 (two) times daily before a meal.    . hydrochlorothiazide (HYDRODIURIL) 25 MG tablet Take 25 mg by mouth daily.     . isosorbide mononitrate (IMDUR) 60 MG 24 hr tablet Take 1 tablet (60 mg total) by mouth daily. 30 tablet 6  . metoprolol tartrate (LOPRESSOR) 25 MG tablet TAKE 1/2 TABLET BY MOUTH TWICE DAILY. (Patient taking differently: Take 12.5 mg by mouth 2 (two) times daily. TAKE 1/2 TABLET BY MOUTH TWICE DAILY.) 30 tablet 3  . nitroGLYCERIN (NITROSTAT) 0.4 MG SL tablet Place 1 tablet (0.4 mg total) under the tongue every 5 (five) minutes x 3 doses as needed for chest pain. Chest Pain 25 tablet 3  . omeprazole (PRILOSEC) 20 MG capsule Take 20 mg by mouth every morning.     . tamsulosin (FLOMAX) 0.4 MG CAPS capsule Take 0.4 mg by mouth.      No current facility-administered medications for this visit.    ROS:   General:  No weight loss, Fever, chills  HEENT: No recent headaches, no nasal bleeding, no visual changes, no sore throat  Neurologic: No dizziness, blackouts, seizures. No recent symptoms of stroke or mini- stroke. No recent episodes of slurred speech, or temporary blindness.  Cardiac: No recent episodes of chest pain/pressure, no shortness of breath at rest.  No shortness of breath with exertion.  Denies history of atrial fibrillation or irregular heartbeat  Vascular: No history of rest pain in feet.  No history of claudication.  No  history of non-healing ulcer, No history of DVT   Pulmonary: No home oxygen, no productive cough, no hemoptysis,  No asthma or wheezing  Musculoskeletal:  [x ] Arthritis, [x ] Low back pain,  [ ]  Joint pain  Hematologic:No history of hypercoagulable state.  No history of easy bleeding.  No history of anemia  Gastrointestinal: No hematochezia or melena,  No gastroesophageal reflux, no trouble swallowing  Urinary: [x ] chronic Kidney disease, [x ] on HD - [ ]  MWF or [x ] TTHS, [ ]  Burning with urination, [ ]  Frequent urination, [ ]  Difficulty urinating;   Skin: No rashes  Psychological: No history of anxiety,  No history of depression   Physical Examination  Filed Vitals:  08/29/14 1420 08/29/14 1422  BP: 139/80 128/79  Pulse: 88 88  Height: 5\' 8"  (1.727 m)   Weight: 210 lb (95.255 kg)   SpO2: 98%     Body mass index is 31.94 kg/(m^2).  General:  Alert and oriented, no acute distress HEENT: Normal Neck: Npositive bruit bilateral or JVD Pulmonary: Clear to auscultation bilaterally Cardiac: Regular Rate and Rhythm without murmur Gastrointestinal: Soft, non-tender, non-distended, no mass, no scars Skin: No rash Extremity Pulses:  2+ radial, brachial pulses bilaterally Musculoskeletal: No deformity or edema  Neurologic: Upper and lower extremity motor 5/5 and symmetric  DATA:  Vein mapping reveals acceptable cephalic vein on the reft with the smallest diameter of 0.27 in the upper arm  Left UE veins are small in size   ASSESSMENT:  ESRD on HD via right IJ tunneled catheter T-TH-SAT.  PLAN: We will plan a right BC fistula creation by one of Dr. Nicole Cella partners in the near future.  Dr. Scot Dock doesn't have OR time available until May 13 th.  Theda Sers, [facility] PA-C [facility] [place] Office: [phone]  The patient was seen in conjunction with Dr. Scot Dock today and the surgical procedures with risk were discussed.  Agree with above.  It appears that his best option for fistula is a right brachiocephalic AV fistula. He dialyzes on Tuesdays Thursdays and Saturdays. The first Friday I have available is May 13. Therefore, we will try to get him scheduled sooner on a Monday Wednesday or Friday.  Deitra Mayo, MD, [place] [phone]

## 2014-08-30 DIAGNOSIS — N2581 Secondary hyperparathyroidism of renal origin: Secondary | ICD-10-CM | POA: Diagnosis not present

## 2014-08-30 DIAGNOSIS — D509 Iron deficiency anemia, unspecified: Secondary | ICD-10-CM | POA: Diagnosis not present

## 2014-08-30 DIAGNOSIS — Z992 Dependence on renal dialysis: Secondary | ICD-10-CM | POA: Diagnosis not present

## 2014-08-30 DIAGNOSIS — D631 Anemia in chronic kidney disease: Secondary | ICD-10-CM | POA: Diagnosis not present

## 2014-08-30 DIAGNOSIS — N186 End stage renal disease: Secondary | ICD-10-CM | POA: Diagnosis not present

## 2014-09-01 DIAGNOSIS — D631 Anemia in chronic kidney disease: Secondary | ICD-10-CM | POA: Diagnosis not present

## 2014-09-01 DIAGNOSIS — N186 End stage renal disease: Secondary | ICD-10-CM | POA: Diagnosis not present

## 2014-09-01 DIAGNOSIS — D509 Iron deficiency anemia, unspecified: Secondary | ICD-10-CM | POA: Diagnosis not present

## 2014-09-01 DIAGNOSIS — Z992 Dependence on renal dialysis: Secondary | ICD-10-CM | POA: Diagnosis not present

## 2014-09-01 DIAGNOSIS — N2581 Secondary hyperparathyroidism of renal origin: Secondary | ICD-10-CM | POA: Diagnosis not present

## 2014-09-04 ENCOUNTER — Encounter (HOSPITAL_COMMUNITY): Payer: Self-pay | Admitting: *Deleted

## 2014-09-04 DIAGNOSIS — E119 Type 2 diabetes mellitus without complications: Secondary | ICD-10-CM | POA: Diagnosis not present

## 2014-09-04 DIAGNOSIS — N2581 Secondary hyperparathyroidism of renal origin: Secondary | ICD-10-CM | POA: Diagnosis not present

## 2014-09-04 DIAGNOSIS — Z992 Dependence on renal dialysis: Secondary | ICD-10-CM | POA: Diagnosis not present

## 2014-09-04 DIAGNOSIS — D509 Iron deficiency anemia, unspecified: Secondary | ICD-10-CM | POA: Diagnosis not present

## 2014-09-04 DIAGNOSIS — D631 Anemia in chronic kidney disease: Secondary | ICD-10-CM | POA: Diagnosis not present

## 2014-09-04 DIAGNOSIS — N186 End stage renal disease: Secondary | ICD-10-CM | POA: Diagnosis not present

## 2014-09-04 MED ORDER — DEXTROSE 5 % IV SOLN
1.5000 g | INTRAVENOUS | Status: AC
  Administered 2014-09-05: 1.5 g via INTRAVENOUS
  Filled 2014-09-04: qty 1.5

## 2014-09-04 MED ORDER — CHLORHEXIDINE GLUCONATE CLOTH 2 % EX PADS: 6.0000 | MEDICATED_PAD | Freq: Once | CUTANEOUS | Status: DC

## 2014-09-04 MED ORDER — SODIUM CHLORIDE 0.9 % IV SOLN
INTRAVENOUS | Status: DC
  Administered 2014-09-05: 10 mL/h via INTRAVENOUS

## 2014-09-04 NOTE — Progress Notes (Signed)
Anesthesia Chart Review:  Pt is 81 year old male scheduled for R brachiocephalic AV fistula creation on 09/05/2014 with Dr. Kellie Simmering.   Pt is same day work up.   Cardiologist Dr. Harl Bowie. Last office visit 03/02/2014.   PMH includes: HTN, dyslipidemia, DM, ESRD, CAD (1997 s/p MI and prior RCA stenting; b. 04/2013 MV: inflat ischemia; c. 05/2013 Cath/PCI: LM 10-20, LAD 30-40p, LCX 95p (3.0x15 Xience DES), 63m, OM1/2 30, RCA 50p diff ISR, 90d (med Rx)), bladder cancer, stroke. Former smoker. BMI 32. S/p cystoscopy with bladder biopsy 04/27/13, s/p transurethral resection of bladder tumor 09/29/12, s/p cataract extraction 07/04/12 and 06/20/12, s/p transurethral resection of bladder tumor 03/15/12.   Medications include: ASA, plavix. Pt to continue plavix per Dr. Scot Dock.   EKG 07/21/2014: Sinus tachycardia (106 bpm). RBBB. T wave abnormality, consider inferior ischemia  Echo 06/09/2013: - Left ventricle: The cavity size was mildly dilated. Wall thickness was normal. Systolic function was normal. The estimated ejection fraction was in the range of 60% to 65%. - Left atrium: The atrium was mildly dilated.  Cardiac cath 06/09/2013: Final Conclusions:  1. 2 vessel obstructive CAD. The culprit lesion appears to be the proximal LCx. The RCA has extensive stenting with moderate diffuse in stent disease and a focal area of severe stenosis at the crux.  Recommendations: Recommend hydration over the weekend with close monitoring of renal function. Given complexity of disease I would recommend staging PCI on Monday. Will load with Brilinta. I anticipate treating the LCx will require rotational atherectomy given the heavy calcification and I would recommend DES since he has complex lesion and CKD given high risk of restenosis. It may be best to treat the culprit vessel only then follow on medical therapy for the RCA disease.   Percutaneous Coronary Intervention 06/12/2013:  Conclusions: Successful PCI of the LCx  with a 3.0 x 15 mm Xience DES using adjunctive rotational atherectomy  Recommendations: ASA, Brilinta at least 12 months, aggressive med Rx for residual CAD.    Carotid doppler US 12/12/2012:  Right: mild to moderate mixed plaque origin and proximal ICA. Left: moderate mixed plaque origin ICA and moderate soft plaque proximal ICA. Bilateral: 0-39% ICA stenosis. Vertebral artery flow is antegrade.  Discussed case with Dr. Oletta Lamas. Pt will need to be evaluated DOS by his assigned anesthesiologist.   Willeen Cass, FNP-BC [facility]/Anesthesiology Phone: [phone] 09/04/2014 3:42 PM

## 2014-09-04 NOTE — Progress Notes (Signed)
Pt denies SOB and chest pain. Pt is under the care of Dr. Carlyle Dolly, cardiology. Spoke with Arbie Cookey, RN at MD's office to clarify Plavix instructions for DOS. According to Arbie Cookey, pt is to take Plavix the morning of procedure if it is normally taken in the AM. Spoke with Levada Dy, NP to review pt cardiac history. Pt verbalized understanding of all pre-op instructions.

## 2014-09-05 ENCOUNTER — Other Ambulatory Visit: Payer: Self-pay | Admitting: *Deleted

## 2014-09-05 ENCOUNTER — Ambulatory Visit (HOSPITAL_COMMUNITY): Payer: Medicare Other | Admitting: Emergency Medicine

## 2014-09-05 ENCOUNTER — Ambulatory Visit (HOSPITAL_COMMUNITY)
Admission: RE | Admit: 2014-09-05 | Discharge: 2014-09-05 | Disposition: A | Discharge status: Home / Self Care | Payer: Medicare Other | Source: Ambulatory Visit | Attending: Vascular Surgery | Admitting: Vascular Surgery

## 2014-09-05 ENCOUNTER — Encounter (HOSPITAL_COMMUNITY): Payer: Self-pay

## 2014-09-05 ENCOUNTER — Encounter (HOSPITAL_COMMUNITY)
Admission: RE | Disposition: A | Discharge status: Home / Self Care | Payer: Self-pay | Source: Ambulatory Visit | Attending: Vascular Surgery

## 2014-09-05 DIAGNOSIS — Z7982 Long term (current) use of aspirin: Secondary | ICD-10-CM | POA: Diagnosis not present

## 2014-09-05 DIAGNOSIS — Z881 Allergy status to other antibiotic agents status: Secondary | ICD-10-CM | POA: Insufficient documentation

## 2014-09-05 DIAGNOSIS — N185 Chronic kidney disease, stage 5: Secondary | ICD-10-CM | POA: Diagnosis not present

## 2014-09-05 DIAGNOSIS — E119 Type 2 diabetes mellitus without complications: Secondary | ICD-10-CM | POA: Insufficient documentation

## 2014-09-05 DIAGNOSIS — Z87891 Personal history of nicotine dependence: Secondary | ICD-10-CM | POA: Insufficient documentation

## 2014-09-05 DIAGNOSIS — E785 Hyperlipidemia, unspecified: Secondary | ICD-10-CM | POA: Diagnosis not present

## 2014-09-05 DIAGNOSIS — I252 Old myocardial infarction: Secondary | ICD-10-CM | POA: Diagnosis not present

## 2014-09-05 DIAGNOSIS — M1990 Unspecified osteoarthritis, unspecified site: Secondary | ICD-10-CM | POA: Insufficient documentation

## 2014-09-05 DIAGNOSIS — Z955 Presence of coronary angioplasty implant and graft: Secondary | ICD-10-CM | POA: Diagnosis not present

## 2014-09-05 DIAGNOSIS — Z886 Allergy status to analgesic agent status: Secondary | ICD-10-CM | POA: Diagnosis not present

## 2014-09-05 DIAGNOSIS — I2510 Atherosclerotic heart disease of native coronary artery without angina pectoris: Secondary | ICD-10-CM | POA: Diagnosis not present

## 2014-09-05 DIAGNOSIS — Z9049 Acquired absence of other specified parts of digestive tract: Secondary | ICD-10-CM | POA: Insufficient documentation

## 2014-09-05 DIAGNOSIS — N186 End stage renal disease: Secondary | ICD-10-CM

## 2014-09-05 DIAGNOSIS — Z8551 Personal history of malignant neoplasm of bladder: Secondary | ICD-10-CM | POA: Diagnosis not present

## 2014-09-05 DIAGNOSIS — I120 Hypertensive chronic kidney disease with stage 5 chronic kidney disease or end stage renal disease: Secondary | ICD-10-CM | POA: Insufficient documentation

## 2014-09-05 DIAGNOSIS — Z888 Allergy status to other drugs, medicaments and biological substances status: Secondary | ICD-10-CM | POA: Insufficient documentation

## 2014-09-05 DIAGNOSIS — Z992 Dependence on renal dialysis: Secondary | ICD-10-CM | POA: Insufficient documentation

## 2014-09-05 DIAGNOSIS — Z8673 Personal history of transient ischemic attack (TIA), and cerebral infarction without residual deficits: Secondary | ICD-10-CM | POA: Insufficient documentation

## 2014-09-05 DIAGNOSIS — Z4931 Encounter for adequacy testing for hemodialysis: Secondary | ICD-10-CM

## 2014-09-05 DIAGNOSIS — N400 Enlarged prostate without lower urinary tract symptoms: Secondary | ICD-10-CM | POA: Diagnosis not present

## 2014-09-05 DIAGNOSIS — C679 Malignant neoplasm of bladder, unspecified: Secondary | ICD-10-CM | POA: Diagnosis not present

## 2014-09-05 HISTORY — PX: AV FISTULA PLACEMENT: SHX1204

## 2014-09-05 LAB — GLUCOSE, CAPILLARY
Glucose-Capillary: 117 mg/dL — ABNORMAL HIGH (ref 70–99)
Glucose-Capillary: 103 mg/dL — ABNORMAL HIGH (ref 70–99)

## 2014-09-05 LAB — POCT I-STAT 4, (NA,K, GLUC, HGB,HCT)
Glucose, Bld: 144 mg/dL — ABNORMAL HIGH (ref 70–99)
HCT: 43 % (ref 39.0–52.0)
Hemoglobin: 14.6 g/dL (ref 13.0–17.0)
POTASSIUM: 4.1 mmol/L (ref 3.5–5.1)
Sodium: 140 mmol/L (ref 135–145)

## 2014-09-05 SURGERY — ARTERIOVENOUS (AV) FISTULA CREATION
Anesthesia: Monitor Anesthesia Care | Site: Arm Lower | Laterality: Right

## 2014-09-05 MED ORDER — MIDAZOLAM HCL 2 MG/2ML IJ SOLN
INTRAMUSCULAR | Status: AC
  Filled 2014-09-05: qty 2

## 2014-09-05 MED ORDER — FENTANYL CITRATE 0.05 MG/ML IJ SOLN: 25.0000 ug | INTRAMUSCULAR | Status: DC | PRN

## 2014-09-05 MED ORDER — PROPOFOL INFUSION 10 MG/ML OPTIME
INTRAVENOUS | Status: DC | PRN
  Administered 2014-09-05: 25 ug/kg/min via INTRAVENOUS

## 2014-09-05 MED ORDER — OXYCODONE HCL 5 MG/5ML PO SOLN: 5.0000 mg | Freq: Once | ORAL | Status: AC | PRN

## 2014-09-05 MED ORDER — ONDANSETRON HCL 4 MG/2ML IJ SOLN
INTRAMUSCULAR | Status: AC
  Filled 2014-09-05: qty 2

## 2014-09-05 MED ORDER — OXYCODONE HCL 5 MG PO TABS
5.0000 mg | ORAL_TABLET | Freq: Once | ORAL | Status: AC | PRN
  Administered 2014-09-05: 5 mg via ORAL

## 2014-09-05 MED ORDER — FENTANYL CITRATE 0.05 MG/ML IJ SOLN
INTRAMUSCULAR | Status: AC
  Filled 2014-09-05: qty 5

## 2014-09-05 MED ORDER — MEPERIDINE HCL 25 MG/ML IJ SOLN: 6.2500 mg | INTRAMUSCULAR | Status: DC | PRN

## 2014-09-05 MED ORDER — ONDANSETRON HCL 4 MG/2ML IJ SOLN: 4.0000 mg | Freq: Once | INTRAMUSCULAR | Status: DC | PRN

## 2014-09-05 MED ORDER — OXYCODONE HCL 5 MG PO TABS
ORAL_TABLET | ORAL | Status: AC
  Filled 2014-09-05: qty 1

## 2014-09-05 MED ORDER — PROPOFOL 10 MG/ML IV BOLUS
INTRAVENOUS | Status: AC
  Filled 2014-09-05: qty 20

## 2014-09-05 MED ORDER — OXYCODONE-ACETAMINOPHEN 5-325 MG PO TABS: 1.0000 | ORAL_TABLET | Freq: Four times a day (QID) | ORAL | Status: DC | PRN

## 2014-09-05 MED ORDER — ONDANSETRON HCL 4 MG/2ML IJ SOLN
INTRAMUSCULAR | Status: DC | PRN
  Administered 2014-09-05: 4 mg via INTRAVENOUS

## 2014-09-05 MED ORDER — LIDOCAINE-EPINEPHRINE (PF) 1 %-1:200000 IJ SOLN
INTRAMUSCULAR | Status: DC | PRN
  Administered 2014-09-05: 10 mL

## 2014-09-05 MED ORDER — LIDOCAINE-EPINEPHRINE (PF) 1 %-1:200000 IJ SOLN
INTRAMUSCULAR | Status: AC
  Filled 2014-09-05: qty 10

## 2014-09-05 MED ORDER — 0.9 % SODIUM CHLORIDE (POUR BTL) OPTIME
TOPICAL | Status: DC | PRN
  Administered 2014-09-05: 1000 mL

## 2014-09-05 MED ORDER — FENTANYL CITRATE 0.05 MG/ML IJ SOLN
INTRAMUSCULAR | Status: DC | PRN
  Administered 2014-09-05 (×4): 25 ug via INTRAVENOUS

## 2014-09-05 MED ORDER — MIDAZOLAM HCL 5 MG/5ML IJ SOLN
INTRAMUSCULAR | Status: DC | PRN
  Administered 2014-09-05 (×2): 1 mg via INTRAVENOUS

## 2014-09-05 MED ORDER — SODIUM CHLORIDE 0.9 % IR SOLN
Status: DC | PRN
  Administered 2014-09-05: 500 mL

## 2014-09-05 MED ORDER — LIDOCAINE HCL (CARDIAC) 20 MG/ML IV SOLN
INTRAVENOUS | Status: AC
  Filled 2014-09-05: qty 5

## 2014-09-05 SURGICAL SUPPLY — 30 items
ARMBAND PINK RESTRICT EXTREMIT (MISCELLANEOUS) ×3 IMPLANT
CANISTER SUCTION 2500CC (MISCELLANEOUS) ×3 IMPLANT
CLIP TI MEDIUM 6 (CLIP) ×3 IMPLANT
CLIP TI WIDE RED SMALL 6 (CLIP) ×3 IMPLANT
COVER PROBE W GEL 5X96 (DRAPES) IMPLANT
DRAIN PENROSE 1/4X12 LTX STRL (WOUND CARE) ×3 IMPLANT
ELECT REM PT RETURN 9FT ADLT (ELECTROSURGICAL) ×3
ELECTRODE REM PT RTRN 9FT ADLT (ELECTROSURGICAL) ×1 IMPLANT
GEL ULTRASOUND 20GR AQUASONIC (MISCELLANEOUS) IMPLANT
GLOVE BIO SURGEON STRL SZ 6.5 (GLOVE) ×1 IMPLANT
GLOVE BIO SURGEONS STRL SZ 6.5 (GLOVE) ×1
GLOVE BIOGEL PI IND STRL 6.5 (GLOVE) IMPLANT
GLOVE BIOGEL PI IND STRL 7.0 (GLOVE) IMPLANT
GLOVE BIOGEL PI INDICATOR 6.5 (GLOVE) ×4
GLOVE BIOGEL PI INDICATOR 7.0 (GLOVE) ×2
GLOVE SS BIOGEL STRL SZ 7 (GLOVE) ×1 IMPLANT
GLOVE SUPERSENSE BIOGEL SZ 7 (GLOVE) ×2
GOWN STRL REUS W/ TWL LRG LVL3 (GOWN DISPOSABLE) ×3 IMPLANT
GOWN STRL REUS W/TWL LRG LVL3 (GOWN DISPOSABLE) ×9
KIT BASIN OR (CUSTOM PROCEDURE TRAY) ×3 IMPLANT
KIT ROOM TURNOVER OR (KITS) ×3 IMPLANT
LIQUID BAND (GAUZE/BANDAGES/DRESSINGS) ×3 IMPLANT
NS IRRIG 1000ML POUR BTL (IV SOLUTION) ×3 IMPLANT
PACK CV ACCESS (CUSTOM PROCEDURE TRAY) ×3 IMPLANT
PAD ARMBOARD 7.5X6 YLW CONV (MISCELLANEOUS) ×6 IMPLANT
SUT PROLENE 6 0 BV (SUTURE) ×3 IMPLANT
SUT VIC AB 3-0 SH 27 (SUTURE) ×3
SUT VIC AB 3-0 SH 27X BRD (SUTURE) ×1 IMPLANT
UNDERPAD 30X30 INCONTINENT (UNDERPADS AND DIAPERS) ×3 IMPLANT
WATER STERILE IRR 1000ML POUR (IV SOLUTION) ×3 IMPLANT

## 2014-09-05 NOTE — Interval H&P Note (Signed)
History and Physical Interval Note:  09/05/2014 1:16 PM  Craig Castillo  has presented today for surgery, with the diagnosis of End Stage Renal Disease N18.6  The various methods of treatment have been discussed with the patient and family. After consideration of risks, benefits and other options for treatment, the patient has consented to  Procedure(s): BRACHIOCEPHALIC ARTERIOVENOUS (AV) FISTULA CREATION (Right) as a surgical intervention .  The patient's history has been reviewed, patient examined, no change in status, stable for surgery.  I have reviewed the patient's chart and labs.  Questions were answered to the patient's satisfaction.     Tinnie Gens

## 2014-09-05 NOTE — H&P (View-Only) (Signed)
[facility] [place] HISTORY AND PHYSICAL   History of Present Illness:  Patient is a 81 y.o. year old male who presents for placement of a permanent hemodialysis access. The patient is right handed .  The patient is currently on hemodialysis.  The cause of renal failure is thought to be secondary to bladder cancer.  Other chronic medical problems include hypertension, hyperlipidemia, stroke and CAD.  He currently takes Plavix,metoprolol, Lipitor  And 81 mg Asprin daily.  Past Medical History  Diagnosis Date  . Dyslipidemia   . DJD (degenerative joint disease)   . Hypertension   . Diabetes mellitus   . BPH (benign prostatic hyperplasia)   . CKD (chronic kidney disease) stage 3, GFR 30-59 ml/min   . Coronary artery disease     a. 1997 s/p MI and prior RCA stenting;  b. 04/2013 MV: inflat ischemia;  c. 05/2013 Cath/PCI: LM 10-20, LAD 30-40p, LCX 95p (3.0x15 Xience DES), 64m, OM1/2 30, RCA 50p diff ISR, 90d (med Rx).  . Bladder cancer   . Stroke     Past Surgical History  Procedure Laterality Date  . Cholecystectomy    . Esophagogastroduodenoscopy  06/08/2002    Soft stricture of the GE  junction with erosive esophagitis. The stricture was dilated to 59 [place] using a Maloney dilator. Swollen fold at GE junction on the gastric site which was biopsied for histology and was suspicious for sentinel folds.  . Colonoscopy  06/08/2002    Small polyp was oblated via cold biopsy from the cecum and two were snared, one was at the rectosigmoid junction measuring about a centimeter, another one at the rectum which was smaller.  . Transurethral resection of bladder tumor      x7  . Coronary angioplasty  1997    3 stents  . Transurethral resection of bladder tumor  03/15/2012    Procedure: TRANSURETHRAL RESECTION OF BLADDER TUMOR (TURBT);  Surgeon: Marissa Nestle, MD;  Location: [facility];  Service: Urology;  Laterality: N/A;  . Cataract extraction w/phaco  06/20/2012   Procedure: CATARACT EXTRACTION PHACO AND INTRAOCULAR LENS PLACEMENT (IOC);  Surgeon: Tonny Branch, MD;  Location: [facility];  Service: Ophthalmology;  Laterality: Right;  CDE=16.59  . Cataract extraction w/phaco Left 07/04/2012    Procedure: CATARACT EXTRACTION PHACO AND INTRAOCULAR LENS PLACEMENT (IOC);  Surgeon: Tonny Branch, MD;  Location: [facility];  Service: Ophthalmology;  Laterality: Left;  CDE: 24.33  . Transurethral resection of bladder tumor N/A 09/29/2012    Procedure: TRANSURETHRAL RESECTION OF BLADDER TUMOR (TURBT);  Surgeon: Marissa Nestle, MD;  Location: [facility];  Service: Urology;  Laterality: N/A;  . Multiple tooth extractions    . Cystoscopy with biopsy N/A 04/27/2013    Procedure: CYSTOSCOPY WITH BLADDER BIOPSY & FULGURATION;  Surgeon: Marissa Nestle, MD;  Location: [facility];  Service: Urology;  Laterality: N/A;  . Left heart catheterization with coronary angiogram N/A 06/09/2013    Procedure: LEFT HEART CATHETERIZATION WITH CORONARY ANGIOGRAM;  Surgeon: Peter M Martinique, MD;  Location: [facility] CATH LAB;  Service: Cardiovascular;  Laterality: N/A;  . Percutaneous coronary rotoblator intervention (pci-r) N/A 06/12/2013    Procedure: PERCUTANEOUS CORONARY ROTOBLATOR INTERVENTION (PCI-R);  Surgeon: Blane Ohara, MD;  Location: [facility] CATH LAB;  Service: Cardiovascular;  Laterality: N/A;     Social History History  Substance Use Topics  . Smoking status: Former Smoker -- 0.50 packs/day for 20 years    Types: Cigarettes    Quit date:  03/08/1981  . Smokeless tobacco: Never Used  . Alcohol Use: No    Family History Family History  Problem Relation Age of Onset  . Stroke Mother 96  . Diabetes Brother 73  . Heart disease Father   . Heart attack Father     Allergies  Allergies  Allergen Reactions  . Aspirin     Due to swelling with celecoxib  . Celecoxib Swelling  . Hydrocodone-Acetaminophen Itching  . Lasix [Furosemide] Other (See Comments)    Unknown   . Morphine Itching  . Niacin  Itching  . Piroxicam Itching     Current Outpatient Prescriptions  Medication Sig Dispense Refill  . amLODipine (NORVASC) 5 MG tablet Take 5 mg by mouth daily.    Marland Kitchen aspirin EC 81 MG tablet Take 81 mg by mouth daily.    Marland Kitchen atorvastatin (LIPITOR) 20 MG tablet Take 20 mg by mouth daily.    . clopidogrel (PLAVIX) 75 MG tablet Take 75 mg by mouth daily.    Marland Kitchen ezetimibe (ZETIA) 10 MG tablet Take 10 mg by mouth every morning.     . furosemide (LASIX) 40 MG tablet Take 40 mg by mouth daily.     Marland Kitchen gemfibrozil (LOPID) 600 MG tablet Take 600 mg by mouth 2 (two) times daily before a meal.    . hydrochlorothiazide (HYDRODIURIL) 25 MG tablet Take 25 mg by mouth daily.     . isosorbide mononitrate (IMDUR) 60 MG 24 hr tablet Take 1 tablet (60 mg total) by mouth daily. 30 tablet 6  . metoprolol tartrate (LOPRESSOR) 25 MG tablet TAKE 1/2 TABLET BY MOUTH TWICE DAILY. (Patient taking differently: Take 12.5 mg by mouth 2 (two) times daily. TAKE 1/2 TABLET BY MOUTH TWICE DAILY.) 30 tablet 3  . nitroGLYCERIN (NITROSTAT) 0.4 MG SL tablet Place 1 tablet (0.4 mg total) under the tongue every 5 (five) minutes x 3 doses as needed for chest pain. Chest Pain 25 tablet 3  . omeprazole (PRILOSEC) 20 MG capsule Take 20 mg by mouth every morning.     . tamsulosin (FLOMAX) 0.4 MG CAPS capsule Take 0.4 mg by mouth.      No current facility-administered medications for this visit.    ROS:   General:  No weight loss, Fever, chills  HEENT: No recent headaches, no nasal bleeding, no visual changes, no sore throat  Neurologic: No dizziness, blackouts, seizures. No recent symptoms of stroke or mini- stroke. No recent episodes of slurred speech, or temporary blindness.  Cardiac: No recent episodes of chest pain/pressure, no shortness of breath at rest.  No shortness of breath with exertion.  Denies history of atrial fibrillation or irregular heartbeat  Vascular: No history of rest pain in feet.  No history of claudication.  No  history of non-healing ulcer, No history of DVT   Pulmonary: No home oxygen, no productive cough, no hemoptysis,  No asthma or wheezing  Musculoskeletal:  [x ] Arthritis, [x ] Low back pain,  [ ]  Joint pain  Hematologic:No history of hypercoagulable state.  No history of easy bleeding.  No history of anemia  Gastrointestinal: No hematochezia or melena,  No gastroesophageal reflux, no trouble swallowing  Urinary: [x ] chronic Kidney disease, [x ] on HD - [ ]  MWF or [x ] TTHS, [ ]  Burning with urination, [ ]  Frequent urination, [ ]  Difficulty urinating;   Skin: No rashes  Psychological: No history of anxiety,  No history of depression   Physical Examination  Filed Vitals:  08/29/14 1420 08/29/14 1422  BP: 139/80 128/79  Pulse: 88 88  Height: 5\' 8"  (1.727 m)   Weight: 210 lb (95.255 kg)   SpO2: 98%     Body mass index is 31.94 kg/(m^2).  General:  Alert and oriented, no acute distress HEENT: Normal Neck: Npositive bruit bilateral or JVD Pulmonary: Clear to auscultation bilaterally Cardiac: Regular Rate and Rhythm without murmur Gastrointestinal: Soft, non-tender, non-distended, no mass, no scars Skin: No rash Extremity Pulses:  2+ radial, brachial pulses bilaterally Musculoskeletal: No deformity or edema  Neurologic: Upper and lower extremity motor 5/5 and symmetric  DATA:  Vein mapping reveals acceptable cephalic vein on the reft with the smallest diameter of 0.27 in the upper arm  Left UE veins are small in size   ASSESSMENT:  ESRD on HD via right IJ tunneled catheter T-TH-SAT.  PLAN: We will plan a right BC fistula creation by one of Dr. Nicole Cella partners in the near future.  Dr. Scot Dock doesn't have OR time available until May 13 th.  Theda Sers, [facility] PA-C [facility] [place] Office: [phone]  The patient was seen in conjunction with Dr. Scot Dock today and the surgical procedures with risk were discussed.  Agree with above.  It appears that his best option for fistula is a right brachiocephalic AV fistula. He dialyzes on Tuesdays Thursdays and Saturdays. The first Friday I have available is May 13. Therefore, we will try to get him scheduled sooner on a Monday Wednesday or Friday.  Deitra Mayo, MD, [place] [phone]

## 2014-09-05 NOTE — Anesthesia Procedure Notes (Signed)
Procedure Name: MAC Date/Time: 09/05/2014 1:45 PM Performed by: Suzy Bouchard Pre-anesthesia Checklist: Patient identified, Emergency Drugs available, Suction available, Patient being monitored and Timeout performed Patient Re-evaluated:Patient Re-evaluated prior to inductionOxygen Delivery Method: Nasal cannula

## 2014-09-05 NOTE — Op Note (Signed)
OPERATIVE REPORT  Date of Surgery: 09/05/2014  Surgeon: Tinnie Gens, MD  Assistant: Gerri Lins PA  Pre-op Diagnosis: End Stage Renal Disease   Post-op Diagnosis: End Stage Renal Disease   Procedure: Procedure(s): BRACHIOCEPHALIC ARTERIOVENOUS (AV) FISTULA CREATION RIGHT ARM  Anesthesia: Mac  EBL: Minimal  Complications: None  Procedure Details: The patient was taken the operating room placed in supine position at which time right upper extremity was prepped with Betadine scrub and solution draped in routine sterile manner. After infiltration forms and Xylocaine with epinephrine a transverse incision was made in the antecubital area. Antecubital vein dissected free. Cephalic branch was about 3 mm in size and it was ligated at its junction with the basilic vein preserving the basilic vein. Other branches were ligated with 3 and 4-0 silk ties and divided. Brachial artery was exposed beneath the fascia it was excellent vessel with a good pulse free of atherosclerotic disease. Artery was an occluded proximal and distally with Vesseloops open 15 blade extended with Potts scissors. Vein was carefully measured spatulated and anastomosed inside the brachial artery with 6-0 Prolene. Clamps were then released nose good pulse and palpable thrill and excellent Doppler flow well up the upper arm over the cephalic vein. There was slight diminution of flow in the radial artery distally with the fistula open which did improve with compression of the fistula. Adequate hemostasis was achieved. No heparin or protamine was given. Wounds closed in layers with Vicryl in a subcuticular fashion with Dermabond patient taken to recovery room in satisfactory condition   Tinnie Gens, MD 09/05/2014 2:51 PM

## 2014-09-05 NOTE — Transfer of Care (Signed)
Immediate Anesthesia Transfer of Care Note  Patient: Craig Castillo  Procedure(s) Performed: Procedure(s): BRACHIOCEPHALIC ARTERIOVENOUS (AV) FISTULA CREATION RIGHT ARM (Right)  Patient Location: PACU  Anesthesia Type:MAC  Level of Consciousness: awake and alert   Airway & Oxygen Therapy: Patient Spontanous Breathing  Post-op Assessment: Report given to RN, Post -op Vital signs reviewed and stable and Patient moving all extremities  Post vital signs: Reviewed and stable  Last Vitals:  Filed Vitals:   09/05/14 1009  BP: 165/70  Pulse: 79  Temp: 36.5 C  Resp: 18    Complications: No apparent anesthesia complications

## 2014-09-05 NOTE — Anesthesia Postprocedure Evaluation (Signed)
  Anesthesia Post-op Note  Patient: Craig Castillo  Procedure(s) Performed: Procedure(s): BRACHIOCEPHALIC ARTERIOVENOUS (AV) FISTULA CREATION RIGHT ARM (Right)  Patient Location: PACU  Anesthesia Type: MAC  Level of Consciousness: awake, alert  and oriented  Airway and Oxygen Therapy: Patient Spontanous Breathing and Patient connected to nasal cannula oxygen  Post-op Pain: none  Post-op Assessment: Post-op Vital signs reviewed, Patient's Cardiovascular Status Stable, Respiratory Function Stable, Patent Airway and Pain level controlled  Post-op Vital Signs: stable  Last Vitals:  Filed Vitals:   09/05/14 1557  BP: 147/66  Pulse: 77  Temp:   Resp: 16    Complications: No apparent anesthesia complications

## 2014-09-05 NOTE — Anesthesia Preprocedure Evaluation (Signed)
Anesthesia Evaluation  Patient identified by MRN, date of birth, ID band Patient awake    Reviewed: Allergy & Precautions, H&P , NPO status , Patient's Chart, lab work & pertinent test results, reviewed documented beta blocker date and time   History of Anesthesia Complications Negative for: history of anesthetic complications  Airway Mallampati: I  TM Distance: >3 FB     Dental  (+) Teeth Intact, Partial Upper   Pulmonary shortness of breath and with exertion, former smoker,  breath sounds clear to auscultation        Cardiovascular hypertension, Pt. on medications and Pt. on home beta blockers - angina+ CAD, + Past MI and + Cardiac Stents Rhythm:Irregular Rate:Normal  Echo 06/09/2013: - Left ventricle: The cavity size was mildly dilated. Wall thickness was normal. Systolic function was normal. The estimated ejection fraction was in the range of 60% to 65%. - Left atrium: The atrium was mildly dilated.  Cardiac cath 06/09/2013: Final Conclusions:  1. 2 vessel obstructive CAD. The culprit lesion appears to be the proximal LCx. The RCA has extensive stenting with moderate diffuse in stent disease and a focal area of severe stenosis at the crux.  Recommendations: Recommend hydration over the weekend with close monitoring of renal function. Given complexity of disease I would recommend staging PCI on Monday. Will load with Brilinta. I anticipate treating the LCx will require rotational atherectomy given the heavy calcification and I would recommend DES since he has complex lesion and CKD given high risk of restenosis. It may be best to treat the culprit vessel only then follow on medical therapy for the RCA disease.   Percutaneous Coronary Intervention 06/12/2013:  Conclusions: Successful PCI of the LCx with a 3.0 x 15 mm Xience DES using adjunctive rotational atherectomy  Recommendations: ASA, Brilinta at least 12 months, aggressive med  Rx for residual CAD.     Neuro/Psych negative neurological ROS  negative psych ROS   GI/Hepatic Neg liver ROS, GERD-  Medicated and Controlled,  Endo/Other  diabetes, Type 2, Insulin DependentMorbid obesity  Renal/GU Renal InsufficiencyRenal disease     Musculoskeletal  (+) Arthritis -, Osteoarthritis,    Abdominal (+) + obese,  Abdomen: soft.    Peds  Hematology   Anesthesia Other Findings   Reproductive/Obstetrics                             Anesthesia Physical  Anesthesia Plan  ASA: IV  Anesthesia Plan: MAC   Post-op Pain Management:    Induction:   Airway Management Planned: Nasal Cannula  Additional Equipment:   Intra-op Plan:   Post-operative Plan:   Informed Consent: I have reviewed the patients History and Physical, chart, labs and discussed the procedure including the risks, benefits and alternatives for the proposed anesthesia with the patient or authorized representative who has indicated his/her understanding and acceptance.     Plan Discussed with:   Anesthesia Plan Comments: (Patient was dialyzed yesterday To continue with Asprin and Plavix per surgeon)        Anesthesia Quick Evaluation

## 2014-09-06 ENCOUNTER — Encounter (HOSPITAL_COMMUNITY): Payer: Self-pay | Admitting: Vascular Surgery

## 2014-09-06 DIAGNOSIS — D509 Iron deficiency anemia, unspecified: Secondary | ICD-10-CM | POA: Diagnosis not present

## 2014-09-06 DIAGNOSIS — D631 Anemia in chronic kidney disease: Secondary | ICD-10-CM | POA: Diagnosis not present

## 2014-09-06 DIAGNOSIS — Z992 Dependence on renal dialysis: Secondary | ICD-10-CM | POA: Diagnosis not present

## 2014-09-06 DIAGNOSIS — N2581 Secondary hyperparathyroidism of renal origin: Secondary | ICD-10-CM | POA: Diagnosis not present

## 2014-09-06 DIAGNOSIS — N186 End stage renal disease: Secondary | ICD-10-CM | POA: Diagnosis not present

## 2014-09-08 DIAGNOSIS — N2581 Secondary hyperparathyroidism of renal origin: Secondary | ICD-10-CM | POA: Diagnosis not present

## 2014-09-08 DIAGNOSIS — D509 Iron deficiency anemia, unspecified: Secondary | ICD-10-CM | POA: Diagnosis not present

## 2014-09-08 DIAGNOSIS — D631 Anemia in chronic kidney disease: Secondary | ICD-10-CM | POA: Diagnosis not present

## 2014-09-08 DIAGNOSIS — Z992 Dependence on renal dialysis: Secondary | ICD-10-CM | POA: Diagnosis not present

## 2014-09-08 DIAGNOSIS — N186 End stage renal disease: Secondary | ICD-10-CM | POA: Diagnosis not present

## 2014-09-10 ENCOUNTER — Telehealth: Payer: Self-pay | Admitting: Vascular Surgery

## 2014-09-10 NOTE — Telephone Encounter (Signed)
Spoke with pt, dpm °

## 2014-09-10 NOTE — Telephone Encounter (Signed)
-----   Message from Mena Goes, RN sent at 09/06/2014 10:59 AM EDT ----- Regarding: Schedule May be duplicate ----- Message -----    From: Ulyses Amor, PA-C    Sent: 09/05/2014   2:34 PM      To: [facility] Charge Pool  S/P right BC fistula creation f/u with Dr. Kellie Simmering needs fistula duplex in 6 weeks

## 2014-09-11 DIAGNOSIS — N186 End stage renal disease: Secondary | ICD-10-CM | POA: Diagnosis not present

## 2014-09-11 DIAGNOSIS — D509 Iron deficiency anemia, unspecified: Secondary | ICD-10-CM | POA: Diagnosis not present

## 2014-09-11 DIAGNOSIS — N2581 Secondary hyperparathyroidism of renal origin: Secondary | ICD-10-CM | POA: Diagnosis not present

## 2014-09-11 DIAGNOSIS — D631 Anemia in chronic kidney disease: Secondary | ICD-10-CM | POA: Diagnosis not present

## 2014-09-11 DIAGNOSIS — Z992 Dependence on renal dialysis: Secondary | ICD-10-CM | POA: Diagnosis not present

## 2014-09-13 DIAGNOSIS — D631 Anemia in chronic kidney disease: Secondary | ICD-10-CM | POA: Diagnosis not present

## 2014-09-13 DIAGNOSIS — D509 Iron deficiency anemia, unspecified: Secondary | ICD-10-CM | POA: Diagnosis not present

## 2014-09-13 DIAGNOSIS — N2581 Secondary hyperparathyroidism of renal origin: Secondary | ICD-10-CM | POA: Diagnosis not present

## 2014-09-13 DIAGNOSIS — Z992 Dependence on renal dialysis: Secondary | ICD-10-CM | POA: Diagnosis not present

## 2014-09-13 DIAGNOSIS — N186 End stage renal disease: Secondary | ICD-10-CM | POA: Diagnosis not present

## 2014-09-15 DIAGNOSIS — N186 End stage renal disease: Secondary | ICD-10-CM | POA: Diagnosis not present

## 2014-09-15 DIAGNOSIS — D509 Iron deficiency anemia, unspecified: Secondary | ICD-10-CM | POA: Diagnosis not present

## 2014-09-15 DIAGNOSIS — N2581 Secondary hyperparathyroidism of renal origin: Secondary | ICD-10-CM | POA: Diagnosis not present

## 2014-09-15 DIAGNOSIS — Z992 Dependence on renal dialysis: Secondary | ICD-10-CM | POA: Diagnosis not present

## 2014-09-15 DIAGNOSIS — D631 Anemia in chronic kidney disease: Secondary | ICD-10-CM | POA: Diagnosis not present

## 2014-09-18 DIAGNOSIS — N186 End stage renal disease: Secondary | ICD-10-CM | POA: Diagnosis not present

## 2014-09-18 DIAGNOSIS — Z992 Dependence on renal dialysis: Secondary | ICD-10-CM | POA: Diagnosis not present

## 2014-09-18 DIAGNOSIS — D631 Anemia in chronic kidney disease: Secondary | ICD-10-CM | POA: Diagnosis not present

## 2014-09-18 DIAGNOSIS — N2581 Secondary hyperparathyroidism of renal origin: Secondary | ICD-10-CM | POA: Diagnosis not present

## 2014-09-18 DIAGNOSIS — D509 Iron deficiency anemia, unspecified: Secondary | ICD-10-CM | POA: Diagnosis not present

## 2014-09-20 ENCOUNTER — Other Ambulatory Visit: Payer: Self-pay | Admitting: Cardiology

## 2014-09-20 DIAGNOSIS — D631 Anemia in chronic kidney disease: Secondary | ICD-10-CM | POA: Diagnosis not present

## 2014-09-20 DIAGNOSIS — Z992 Dependence on renal dialysis: Secondary | ICD-10-CM | POA: Diagnosis not present

## 2014-09-20 DIAGNOSIS — D509 Iron deficiency anemia, unspecified: Secondary | ICD-10-CM | POA: Diagnosis not present

## 2014-09-20 DIAGNOSIS — N186 End stage renal disease: Secondary | ICD-10-CM | POA: Diagnosis not present

## 2014-09-20 DIAGNOSIS — N2581 Secondary hyperparathyroidism of renal origin: Secondary | ICD-10-CM | POA: Diagnosis not present

## 2014-09-22 DIAGNOSIS — Z992 Dependence on renal dialysis: Secondary | ICD-10-CM | POA: Diagnosis not present

## 2014-09-22 DIAGNOSIS — N2581 Secondary hyperparathyroidism of renal origin: Secondary | ICD-10-CM | POA: Diagnosis not present

## 2014-09-22 DIAGNOSIS — N186 End stage renal disease: Secondary | ICD-10-CM | POA: Diagnosis not present

## 2014-09-22 DIAGNOSIS — D631 Anemia in chronic kidney disease: Secondary | ICD-10-CM | POA: Diagnosis not present

## 2014-09-22 DIAGNOSIS — D509 Iron deficiency anemia, unspecified: Secondary | ICD-10-CM | POA: Diagnosis not present

## 2014-09-26 DIAGNOSIS — Z4803 Encounter for change or removal of drains: Secondary | ICD-10-CM | POA: Diagnosis not present

## 2014-09-26 DIAGNOSIS — N184 Chronic kidney disease, stage 4 (severe): Secondary | ICD-10-CM | POA: Diagnosis not present

## 2014-09-26 DIAGNOSIS — Z936 Other artificial openings of urinary tract status: Secondary | ICD-10-CM | POA: Diagnosis not present

## 2014-09-26 DIAGNOSIS — N1330 Unspecified hydronephrosis: Secondary | ICD-10-CM | POA: Diagnosis not present

## 2014-09-26 DIAGNOSIS — Z466 Encounter for fitting and adjustment of urinary device: Secondary | ICD-10-CM | POA: Diagnosis not present

## 2014-09-26 DIAGNOSIS — Z436 Encounter for attention to other artificial openings of urinary tract: Secondary | ICD-10-CM | POA: Diagnosis not present

## 2014-09-29 DIAGNOSIS — N2581 Secondary hyperparathyroidism of renal origin: Secondary | ICD-10-CM | POA: Diagnosis not present

## 2014-09-29 DIAGNOSIS — D509 Iron deficiency anemia, unspecified: Secondary | ICD-10-CM | POA: Diagnosis not present

## 2014-09-29 DIAGNOSIS — D631 Anemia in chronic kidney disease: Secondary | ICD-10-CM | POA: Diagnosis not present

## 2014-09-29 DIAGNOSIS — N186 End stage renal disease: Secondary | ICD-10-CM | POA: Diagnosis not present

## 2014-09-29 DIAGNOSIS — Z992 Dependence on renal dialysis: Secondary | ICD-10-CM | POA: Diagnosis not present

## 2014-10-02 DIAGNOSIS — D509 Iron deficiency anemia, unspecified: Secondary | ICD-10-CM | POA: Diagnosis not present

## 2014-10-02 DIAGNOSIS — N186 End stage renal disease: Secondary | ICD-10-CM | POA: Diagnosis not present

## 2014-10-02 DIAGNOSIS — D631 Anemia in chronic kidney disease: Secondary | ICD-10-CM | POA: Diagnosis not present

## 2014-10-02 DIAGNOSIS — Z992 Dependence on renal dialysis: Secondary | ICD-10-CM | POA: Diagnosis not present

## 2014-10-02 DIAGNOSIS — N2581 Secondary hyperparathyroidism of renal origin: Secondary | ICD-10-CM | POA: Diagnosis not present

## 2014-10-04 DIAGNOSIS — D509 Iron deficiency anemia, unspecified: Secondary | ICD-10-CM | POA: Diagnosis not present

## 2014-10-04 DIAGNOSIS — N2581 Secondary hyperparathyroidism of renal origin: Secondary | ICD-10-CM | POA: Diagnosis not present

## 2014-10-04 DIAGNOSIS — N186 End stage renal disease: Secondary | ICD-10-CM | POA: Diagnosis not present

## 2014-10-04 DIAGNOSIS — Z992 Dependence on renal dialysis: Secondary | ICD-10-CM | POA: Diagnosis not present

## 2014-10-04 DIAGNOSIS — D631 Anemia in chronic kidney disease: Secondary | ICD-10-CM | POA: Diagnosis not present

## 2014-10-06 DIAGNOSIS — Z992 Dependence on renal dialysis: Secondary | ICD-10-CM | POA: Diagnosis not present

## 2014-10-06 DIAGNOSIS — N2581 Secondary hyperparathyroidism of renal origin: Secondary | ICD-10-CM | POA: Diagnosis not present

## 2014-10-06 DIAGNOSIS — N186 End stage renal disease: Secondary | ICD-10-CM | POA: Diagnosis not present

## 2014-10-06 DIAGNOSIS — D631 Anemia in chronic kidney disease: Secondary | ICD-10-CM | POA: Diagnosis not present

## 2014-10-06 DIAGNOSIS — D509 Iron deficiency anemia, unspecified: Secondary | ICD-10-CM | POA: Diagnosis not present

## 2014-10-09 DIAGNOSIS — Z992 Dependence on renal dialysis: Secondary | ICD-10-CM | POA: Diagnosis not present

## 2014-10-09 DIAGNOSIS — D631 Anemia in chronic kidney disease: Secondary | ICD-10-CM | POA: Diagnosis not present

## 2014-10-09 DIAGNOSIS — D509 Iron deficiency anemia, unspecified: Secondary | ICD-10-CM | POA: Diagnosis not present

## 2014-10-09 DIAGNOSIS — N186 End stage renal disease: Secondary | ICD-10-CM | POA: Diagnosis not present

## 2014-10-09 DIAGNOSIS — N2581 Secondary hyperparathyroidism of renal origin: Secondary | ICD-10-CM | POA: Diagnosis not present

## 2014-10-11 DIAGNOSIS — N2581 Secondary hyperparathyroidism of renal origin: Secondary | ICD-10-CM | POA: Diagnosis not present

## 2014-10-11 DIAGNOSIS — D631 Anemia in chronic kidney disease: Secondary | ICD-10-CM | POA: Diagnosis not present

## 2014-10-11 DIAGNOSIS — N186 End stage renal disease: Secondary | ICD-10-CM | POA: Diagnosis not present

## 2014-10-11 DIAGNOSIS — D509 Iron deficiency anemia, unspecified: Secondary | ICD-10-CM | POA: Diagnosis not present

## 2014-10-11 DIAGNOSIS — Z992 Dependence on renal dialysis: Secondary | ICD-10-CM | POA: Diagnosis not present

## 2014-10-12 ENCOUNTER — Encounter: Payer: Self-pay | Admitting: Vascular Surgery

## 2014-10-13 DIAGNOSIS — Z992 Dependence on renal dialysis: Secondary | ICD-10-CM | POA: Diagnosis not present

## 2014-10-13 DIAGNOSIS — N186 End stage renal disease: Secondary | ICD-10-CM | POA: Diagnosis not present

## 2014-10-13 DIAGNOSIS — D631 Anemia in chronic kidney disease: Secondary | ICD-10-CM | POA: Diagnosis not present

## 2014-10-13 DIAGNOSIS — D509 Iron deficiency anemia, unspecified: Secondary | ICD-10-CM | POA: Diagnosis not present

## 2014-10-13 DIAGNOSIS — N2581 Secondary hyperparathyroidism of renal origin: Secondary | ICD-10-CM | POA: Diagnosis not present

## 2014-10-15 ENCOUNTER — Encounter: Payer: Self-pay | Admitting: Vascular Surgery

## 2014-10-16 ENCOUNTER — Other Ambulatory Visit (HOSPITAL_COMMUNITY): Payer: Medicare Other

## 2014-10-16 ENCOUNTER — Ambulatory Visit (INDEPENDENT_AMBULATORY_CARE_PROVIDER_SITE_OTHER): Payer: Self-pay | Admitting: Vascular Surgery

## 2014-10-16 ENCOUNTER — Encounter: Payer: Medicare Other | Admitting: Vascular Surgery

## 2014-10-16 ENCOUNTER — Encounter: Payer: Self-pay | Admitting: Vascular Surgery

## 2014-10-16 ENCOUNTER — Ambulatory Visit (HOSPITAL_COMMUNITY)
Admission: RE | Admit: 2014-10-16 | Discharge: 2014-10-16 | Disposition: A | Discharge status: Home / Self Care | Payer: Medicare Other | Source: Ambulatory Visit | Attending: Vascular Surgery | Admitting: Vascular Surgery

## 2014-10-16 VITALS — BP 140/64 | HR 82 | Resp 18 | Ht 68.0 in | Wt 210.0 lb

## 2014-10-16 DIAGNOSIS — Z4931 Encounter for adequacy testing for hemodialysis: Secondary | ICD-10-CM | POA: Diagnosis not present

## 2014-10-16 DIAGNOSIS — N186 End stage renal disease: Secondary | ICD-10-CM | POA: Diagnosis not present

## 2014-10-16 NOTE — Progress Notes (Signed)
Subjective:     Patient ID: Craig Castillo, male   DOB: Dec 23, 1932, 81 y.o.   MRN: [id number]  HPI this 81 year old male returns for follow-up regarding his right brachial cephalic AV fistula I created 09/05/2014. Patient dialyzes Monday Wednesday and Friday through a right IJ catheter. His pain or numbness in the right hand.   Review of Systems     Objective:   Physical Exam BP 140/64 mmHg  Pulse 82  Resp 18  Ht 5\' 8"  (1.727 m)  Wt 210 lb (95.255 kg)  BMI 31.94 kg/m2  Gen. well-developed well-nourished male in no apparent distress alert and oriented 3 Right antecubital incision nicely healed. Excellent pulse and palpable thrill and right brachial-cephalic AV fistula. Improvement and radial pulse with compression of the fistula. Good perfusion of right hand with fistula patent.  Today I ordered a duplex scan of the fistula which I reviewed and interpreted. There is excellent flow throughout the fistula with no significant side branches.     Assessment:     Nicely functioning right brachial-cephalic AV fistula created 09/05/2014-patient currently on hemodialysis through right IJ tunneled catheter    Plan:     Okay to use fistula 12/05/2014 Return to see me on when necessary  basis

## 2014-10-17 ENCOUNTER — Encounter (HOSPITAL_COMMUNITY): Payer: Medicare Other

## 2014-10-18 ENCOUNTER — Encounter (HOSPITAL_COMMUNITY): Payer: Self-pay

## 2014-10-18 ENCOUNTER — Emergency Department (HOSPITAL_COMMUNITY)
Admission: EM | Admit: 2014-10-18 | Discharge: 2014-10-18 | Disposition: A | Discharge status: Home / Self Care | Payer: Medicare Other | Attending: Emergency Medicine | Admitting: Emergency Medicine

## 2014-10-18 ENCOUNTER — Emergency Department (HOSPITAL_COMMUNITY): Payer: Medicare Other

## 2014-10-18 DIAGNOSIS — E785 Hyperlipidemia, unspecified: Secondary | ICD-10-CM | POA: Insufficient documentation

## 2014-10-18 DIAGNOSIS — Z7902 Long term (current) use of antithrombotics/antiplatelets: Secondary | ICD-10-CM | POA: Diagnosis not present

## 2014-10-18 DIAGNOSIS — E119 Type 2 diabetes mellitus without complications: Secondary | ICD-10-CM | POA: Diagnosis not present

## 2014-10-18 DIAGNOSIS — N134 Hydroureter: Secondary | ICD-10-CM | POA: Diagnosis not present

## 2014-10-18 DIAGNOSIS — Z9861 Coronary angioplasty status: Secondary | ICD-10-CM | POA: Diagnosis not present

## 2014-10-18 DIAGNOSIS — I129 Hypertensive chronic kidney disease with stage 1 through stage 4 chronic kidney disease, or unspecified chronic kidney disease: Secondary | ICD-10-CM | POA: Diagnosis not present

## 2014-10-18 DIAGNOSIS — Z87891 Personal history of nicotine dependence: Secondary | ICD-10-CM | POA: Insufficient documentation

## 2014-10-18 DIAGNOSIS — N3289 Other specified disorders of bladder: Secondary | ICD-10-CM | POA: Diagnosis not present

## 2014-10-18 DIAGNOSIS — N183 Chronic kidney disease, stage 3 (moderate): Secondary | ICD-10-CM | POA: Insufficient documentation

## 2014-10-18 DIAGNOSIS — I252 Old myocardial infarction: Secondary | ICD-10-CM | POA: Diagnosis not present

## 2014-10-18 DIAGNOSIS — Z79899 Other long term (current) drug therapy: Secondary | ICD-10-CM | POA: Insufficient documentation

## 2014-10-18 DIAGNOSIS — K921 Melena: Secondary | ICD-10-CM | POA: Insufficient documentation

## 2014-10-18 DIAGNOSIS — K6289 Other specified diseases of anus and rectum: Secondary | ICD-10-CM | POA: Diagnosis not present

## 2014-10-18 DIAGNOSIS — Z7982 Long term (current) use of aspirin: Secondary | ICD-10-CM | POA: Diagnosis not present

## 2014-10-18 DIAGNOSIS — N289 Disorder of kidney and ureter, unspecified: Secondary | ICD-10-CM | POA: Diagnosis not present

## 2014-10-18 DIAGNOSIS — Z8551 Personal history of malignant neoplasm of bladder: Secondary | ICD-10-CM | POA: Insufficient documentation

## 2014-10-18 DIAGNOSIS — Z9889 Other specified postprocedural states: Secondary | ICD-10-CM | POA: Insufficient documentation

## 2014-10-18 DIAGNOSIS — R319 Hematuria, unspecified: Secondary | ICD-10-CM | POA: Insufficient documentation

## 2014-10-18 DIAGNOSIS — N400 Enlarged prostate without lower urinary tract symptoms: Secondary | ICD-10-CM | POA: Diagnosis not present

## 2014-10-18 DIAGNOSIS — R52 Pain, unspecified: Secondary | ICD-10-CM | POA: Insufficient documentation

## 2014-10-18 LAB — CBC WITH DIFFERENTIAL/PLATELET
BASOS PCT: 0 % (ref 0–1)
Basophils Absolute: 0 10*3/uL (ref 0.0–0.1)
EOS PCT: 3 % (ref 0–5)
Eosinophils Absolute: 0.3 10*3/uL (ref 0.0–0.7)
HCT: 33.3 % — ABNORMAL LOW (ref 39.0–52.0)
Hemoglobin: 10.8 g/dL — ABNORMAL LOW (ref 13.0–17.0)
Lymphocytes Relative: 13 % (ref 12–46)
Lymphs Abs: 1.2 10*3/uL (ref 0.7–4.0)
MCH: 33.3 pg (ref 26.0–34.0)
MCHC: 32.4 g/dL (ref 30.0–36.0)
MCV: 102.8 fL — ABNORMAL HIGH (ref 78.0–100.0)
Monocytes Absolute: 0.6 10*3/uL (ref 0.1–1.0)
Monocytes Relative: 6 % (ref 3–12)
Neutro Abs: 7 10*3/uL (ref 1.7–7.7)
Neutrophils Relative %: 78 % — ABNORMAL HIGH (ref 43–77)
Platelets: 242 10*3/uL (ref 150–400)
RBC: 3.24 MIL/uL — ABNORMAL LOW (ref 4.22–5.81)
RDW: 16.3 % — ABNORMAL HIGH (ref 11.5–15.5)
WBC: 9 10*3/uL (ref 4.0–10.5)

## 2014-10-18 LAB — URINALYSIS, ROUTINE W REFLEX MICROSCOPIC
Bilirubin Urine: NEGATIVE
Glucose, UA: NEGATIVE mg/dL
KETONES UR: NEGATIVE mg/dL
Nitrite: POSITIVE — AB
PH: 6.5 (ref 5.0–8.0)
Specific Gravity, Urine: 1.02 (ref 1.005–1.030)
Urobilinogen, UA: 0.2 mg/dL (ref 0.0–1.0)

## 2014-10-18 LAB — COMPREHENSIVE METABOLIC PANEL
ALK PHOS: 57 U/L (ref 38–126)
ALT: 9 U/L — ABNORMAL LOW (ref 17–63)
ANION GAP: 20 — AB (ref 5–15)
AST: 11 U/L — ABNORMAL LOW (ref 15–41)
Albumin: 3.6 g/dL (ref 3.5–5.0)
BUN: 94 mg/dL — ABNORMAL HIGH (ref 6–20)
CALCIUM: 8 mg/dL — AB (ref 8.9–10.3)
CHLORIDE: 100 mmol/L — AB (ref 101–111)
CO2: 19 mmol/L — ABNORMAL LOW (ref 22–32)
CREATININE: 9.58 mg/dL — AB (ref 0.61–1.24)
GFR calc Af Amer: 5 mL/min — ABNORMAL LOW (ref >60)
GFR calc non Af Amer: 5 mL/min — ABNORMAL LOW (ref >60)
GLUCOSE: 138 mg/dL — AB (ref 65–99)
POTASSIUM: 4 mmol/L (ref 3.5–5.1)
SODIUM: 139 mmol/L (ref 135–145)
Total Bilirubin: 0.8 mg/dL (ref 0.3–1.2)
Total Protein: 7.2 g/dL (ref 6.5–8.1)

## 2014-10-18 LAB — URINE MICROSCOPIC-ADD ON

## 2014-10-18 MED ORDER — CEPHALEXIN 500 MG PO CAPS
500.0000 mg | ORAL_CAPSULE | Freq: Four times a day (QID) | ORAL | Status: DC
Start: 2014-10-18 — End: 2015-01-23

## 2014-10-18 MED ORDER — CEFTRIAXONE SODIUM 1 G IJ SOLR
1.0000 g | Freq: Once | INTRAMUSCULAR | Status: AC
  Administered 2014-10-18: 1 g via INTRAVENOUS
  Filled 2014-10-18: qty 10

## 2014-10-18 NOTE — Discharge Instructions (Signed)
Follow up with your md next week. °

## 2014-10-18 NOTE — ED Notes (Signed)
Pt reports has indwelling foley and has gross hematuria in urine since this morning.  Also reports intermittent pain and bleeding from rectum x 1 week.  Pt reports has had bright red blood in the commode from rectum.

## 2014-10-18 NOTE — ED Provider Notes (Signed)
CSN: [id number]     Arrival date & time 10/18/14  0845 History  This chart was scribed for Craig Ferguson, MD by Girtha Hake, ED Scribe. The patient was seen in [facility]. The patient's care was started at 9:20 AM.     Chief Complaint  Patient presents with  . Hematuria  . GI Bleeding   Patient is a 81 y.o. male presenting with hematuria. The history is provided by the patient. No language interpreter was used.  Hematuria This is a new problem. The current episode started 3 to 5 hours ago. The problem occurs constantly. The problem has not changed since onset.Pertinent negatives include no chest pain, no abdominal pain and no headaches. Nothing aggravates the symptoms. Nothing relieves the symptoms. He has tried nothing for the symptoms.   HPI Comments: Craig Castillo is a 81 y.o. Male with a history of bladder cancer who presents to the Emergency Department complaining of hematuria beginning this morning. Patient's bladder was surgically removed and he now has a catheter in both kidneys. He noticed blood in the urostomy bag from his right kidney early this morning. Patient denies taking any blood thinners.   Patient also complains of bright red blood in his stool beginning 1 week ago.   PCP is Dr. Gerarda Fraction.  Past Medical History  Diagnosis Date  . Dyslipidemia   . DJD (degenerative joint disease)   . Hypertension   . Diabetes mellitus   . BPH (benign prostatic hyperplasia)   . Coronary artery disease     a. 1997 s/p MI and prior RCA stenting;  b. 04/2013 MV: inflat ischemia;  c. 05/2013 Cath/PCI: LM 10-20, LAD 30-40p, LCX 95p (3.0x15 Xience DES), 105m, OM1/2 30, RCA 50p diff ISR, 90d (med Rx).  . Bladder cancer   . Stroke   . Myocardial infarction   . CKD (chronic kidney disease) stage 3, GFR 30-59 ml/min     dialysis   Past Surgical History  Procedure Laterality Date  . Cholecystectomy    . Esophagogastroduodenoscopy  06/08/2002    Soft stricture of the GE  junction with  erosive esophagitis. The stricture was dilated to 31 [place] using a Maloney dilator. Swollen fold at GE junction on the gastric site which was biopsied for histology and was suspicious for sentinel folds.  . Colonoscopy  06/08/2002    Small polyp was oblated via cold biopsy from the cecum and two were snared, one was at the rectosigmoid junction measuring about a centimeter, another one at the rectum which was smaller.  . Transurethral resection of bladder tumor      x7  . Coronary angioplasty  1997    3 stents  . Transurethral resection of bladder tumor  03/15/2012    Procedure: TRANSURETHRAL RESECTION OF BLADDER TUMOR (TURBT);  Surgeon: Marissa Nestle, MD;  Location: [facility];  Service: Urology;  Laterality: N/A;  . Cataract extraction w/phaco  06/20/2012    Procedure: CATARACT EXTRACTION PHACO AND INTRAOCULAR LENS PLACEMENT (IOC);  Surgeon: Tonny Branch, MD;  Location: [facility];  Service: Ophthalmology;  Laterality: Right;  CDE=16.59  . Cataract extraction w/phaco Left 07/04/2012    Procedure: CATARACT EXTRACTION PHACO AND INTRAOCULAR LENS PLACEMENT (IOC);  Surgeon: Tonny Branch, MD;  Location: [facility];  Service: Ophthalmology;  Laterality: Left;  CDE: 24.33  . Transurethral resection of bladder tumor N/A 09/29/2012    Procedure: TRANSURETHRAL RESECTION OF BLADDER TUMOR (TURBT);  Surgeon: Marissa Nestle, MD;  Location: [facility];  Service: Urology;  Laterality: N/A;  . Multiple tooth extractions    . Cystoscopy with biopsy N/A 04/27/2013    Procedure: CYSTOSCOPY WITH BLADDER BIOPSY & FULGURATION;  Surgeon: Marissa Nestle, MD;  Location: [facility];  Service: Urology;  Laterality: N/A;  . Left heart catheterization with coronary angiogram N/A 06/09/2013    Procedure: LEFT HEART CATHETERIZATION WITH CORONARY ANGIOGRAM;  Surgeon: Peter M Martinique, MD;  Location: [facility] CATH LAB;  Service: Cardiovascular;  Laterality: N/A;  . Percutaneous coronary rotoblator intervention (pci-r) N/A 06/12/2013    Procedure:  PERCUTANEOUS CORONARY ROTOBLATOR INTERVENTION (PCI-R);  Surgeon: Blane Ohara, MD;  Location: [facility] CATH LAB;  Service: Cardiovascular;  Laterality: N/A;  . Tonsillectomy    . Av fistula placement Right 09/05/2014    Procedure: BRACHIOCEPHALIC ARTERIOVENOUS (AV) FISTULA CREATION RIGHT ARM;  Surgeon: Mal Misty, MD;  Location: [facility] OR;  Service: Vascular;  Laterality: Right;   Family History  Problem Relation Age of Onset  . Stroke Mother 48  . Diabetes Brother 91  . Heart disease Father   . Heart attack Father    History  Substance Use Topics  . Smoking status: Former Smoker -- 0.50 packs/day for 20 years    Types: Cigarettes    Quit date: 03/08/1981  . Smokeless tobacco: Never Used  . Alcohol Use: No    Review of Systems  Constitutional: Negative for appetite change and fatigue.  HENT: Negative for congestion, ear discharge and sinus pressure.   Eyes: Negative for discharge.  Respiratory: Negative for cough.   Cardiovascular: Negative for chest pain.  Gastrointestinal: Positive for blood in stool. Negative for abdominal pain and diarrhea.  Genitourinary: Positive for hematuria. Negative for frequency.  Musculoskeletal: Negative for back pain.  Skin: Negative for rash.  Neurological: Negative for seizures and headaches.  Psychiatric/Behavioral: Negative for hallucinations.      Allergies  Aspirin; Celecoxib; Hydrocodone-acetaminophen; Morphine; Niacin; Other; and Piroxicam  Home Medications   Prior to Admission medications   Medication Sig Start Date End Date Taking? Authorizing Provider  amLODipine (NORVASC) 5 MG tablet Take 5 mg by mouth daily.    Historical Provider, MD  aspirin EC 81 MG tablet Take 81 mg by mouth daily.    Historical Provider, MD  atorvastatin (LIPITOR) 20 MG tablet Take 20 mg by mouth daily.    Historical Provider, MD  clopidogrel (PLAVIX) 75 MG tablet Take 75 mg by mouth daily.    Historical Provider, MD  ezetimibe (ZETIA) 10 MG tablet Take 10  mg by mouth every morning.     Historical Provider, MD  furosemide (LASIX) 40 MG tablet Take 40 mg by mouth daily.  08/25/14   Historical Provider, MD  gemfibrozil (LOPID) 600 MG tablet Take 600 mg by mouth 2 (two) times daily before a meal.    Historical Provider, MD  hydrochlorothiazide (HYDRODIURIL) 25 MG tablet Take 25 mg by mouth daily.  08/20/14   Historical Provider, MD  isosorbide mononitrate (IMDUR) 60 MG 24 hr tablet Take 1 tablet (60 mg total) by mouth daily. 08/20/14   Arnoldo Lenis, MD  metoprolol tartrate (LOPRESSOR) 25 MG tablet TAKE 1/2 TABLET BY MOUTH TWICE DAILY. 09/20/14   Arnoldo Lenis, MD  nitroGLYCERIN (NITROSTAT) 0.4 MG SL tablet Place 1 tablet (0.4 mg total) under the tongue every 5 (five) minutes x 3 doses as needed for chest pain. Chest Pain 02/19/14   Arnoldo Lenis, MD  omeprazole (PRILOSEC) 20 MG capsule Take 20 mg by mouth every morning.  Historical Provider, MD  oxyCODONE-acetaminophen (PERCOCET/ROXICET) 5-325 MG per tablet Take 1 tablet by mouth every 6 (six) hours as needed. Patient not taking: Reported on 10/16/2014 09/05/14   Ulyses Amor, PA-C  tamsulosin (FLOMAX) 0.4 MG CAPS capsule Take 0.4 mg by mouth.     Historical Provider, MD   Triage Vitals: BP 107/60 mmHg  Pulse 81  Temp(Src) 98.3 F (36.8 C) (Oral)  Resp 18  Ht 5\' 8"  (1.727 m)  Wt 210 lb (95.255 kg)  BMI 31.94 kg/m2  SpO2 98% Physical Exam  Constitutional: He is oriented to person, place, and time. He appears well-developed.  HENT:  Head: Normocephalic.  Eyes: Conjunctivae and EOM are normal. No scleral icterus.  Neck: Neck supple. No thyromegaly present.  Cardiovascular: Normal rate and regular rhythm.  Exam reveals no gallop and no friction rub.   No murmur heard. Pulmonary/Chest: No stridor. He has no wheezes. He has no rales. He exhibits no tenderness.  Abdominal: He exhibits no distension. There is no tenderness. There is no rebound.  Genitourinary:  Patient has a urostomy to  each kidney. Moderate amount of blood in the urostomy bag from his right kidney.  Musculoskeletal: Normal range of motion. He exhibits no edema.  Lymphadenopathy:    He has no cervical adenopathy.  Neurological: He is oriented to person, place, and time. He exhibits normal muscle tone. Coordination normal.  Skin: No rash noted. No erythema.  Psychiatric: He has a normal mood and affect. His behavior is normal.  Nursing note and vitals reviewed.   ED Course  Procedures (including critical care time) DIAGNOSTIC STUDIES: Oxygen Saturation is 98% on room air, normal by my interpretation.    COORDINATION OF CARE:   Labs Review Labs Reviewed  CBC WITH DIFFERENTIAL/PLATELET  COMPREHENSIVE METABOLIC PANEL  URINALYSIS, ROUTINE W REFLEX MICROSCOPIC (NOT AT [facility])    Imaging Review No results found.   EKG Interpretation None      MDM   Final diagnoses:  None    Hematuria,  tx for kidney infection with keflex and follow up with pcp  The chart was scribed for me under my direct supervision.  I personally performed the history, physical, and medical decision making and all procedures in the evaluation of this patient.Craig Ferguson, MD 10/18/14 1131

## 2014-10-19 ENCOUNTER — Other Ambulatory Visit: Payer: Self-pay

## 2014-10-19 ENCOUNTER — Other Ambulatory Visit: Payer: Self-pay | Admitting: Cardiology

## 2014-10-19 MED ORDER — METOPROLOL TARTRATE 25 MG PO TABS
12.5000 mg | ORAL_TABLET | Freq: Two times a day (BID) | ORAL | Status: DC
Start: 2014-10-19 — End: 2014-11-20

## 2014-10-19 NOTE — Telephone Encounter (Signed)
Received fax refill request  Rx # [id number] Medication:  Metoprolol 25 mg tablets Qty 30 Sig:  Take 1/2 tablet by mouth twice daily Physician:  Harl Bowie

## 2014-10-20 DIAGNOSIS — N2581 Secondary hyperparathyroidism of renal origin: Secondary | ICD-10-CM | POA: Diagnosis not present

## 2014-10-20 DIAGNOSIS — Z992 Dependence on renal dialysis: Secondary | ICD-10-CM | POA: Diagnosis not present

## 2014-10-20 DIAGNOSIS — D509 Iron deficiency anemia, unspecified: Secondary | ICD-10-CM | POA: Diagnosis not present

## 2014-10-20 DIAGNOSIS — N186 End stage renal disease: Secondary | ICD-10-CM | POA: Diagnosis not present

## 2014-10-20 DIAGNOSIS — D631 Anemia in chronic kidney disease: Secondary | ICD-10-CM | POA: Diagnosis not present

## 2014-10-20 LAB — URINE CULTURE

## 2014-10-21 ENCOUNTER — Telehealth (HOSPITAL_COMMUNITY): Payer: Self-pay

## 2014-10-21 NOTE — Progress Notes (Signed)
ED Antimicrobial Stewardship Positive Culture Follow Up   Craig Castillo is an 81 y.o. male who presented to [facility] on 10/16/2014 with a chief complaint of No chief complaint on file.   Recent Results (from the past 720 hour(s))  Urine culture     Status: None   Collection Time: 10/18/14  9:28 AM  Result Value Ref Range Status   Specimen Description URINE, CATHETERIZED  Final   Special Requests NONE  Final   Colony Count   Final    >=100,000 COLONIES/ML Performed at [facility]   Final    PSEUDOMONAS PUTIDA Performed at [facility]    Report Status 10/20/2014 FINAL  Final   Organism ID, Bacteria PSEUDOMONAS PUTIDA  Final      Susceptibility   Pseudomonas putida - MIC*    CIPROFLOXACIN 1 SENSITIVE Sensitive     GENTAMICIN 2 SENSITIVE Sensitive     TOBRAMYCIN <=1 SENSITIVE Sensitive     CEFTAZIDIME 4 SENSITIVE Sensitive     IMIPENEM 2 SENSITIVE Sensitive     PIP/TAZO 32 INTERMEDIATE Intermediate     * PSEUDOMONAS PUTIDA    [x]  Treated with cephalexin, organism resistant to prescribed antimicrobial []  Patient discharged originally without antimicrobial agent and treatment is now indicated  New antibiotic prescription: ciprofloxacin 500mg  po daily for 7 days and stop cephalexin  ED Provider: Baron Sane, PA-C   Candie Mile 10/21/2014, 8:18 AM Infectious Diseases Pharmacist Phone# [phone]

## 2014-10-21 NOTE — Telephone Encounter (Signed)
Pt returned call . Informed. Would like medication called to [facility] [phone]. Cipro 500mg  po daily x 7 days. Spoke with pharm.

## 2014-10-21 NOTE — Telephone Encounter (Signed)
Post ED Visit - Positive Culture Follow-up: Chart Hand-off to ED Flow Manager  Culture assessed and recommendations reviewed by: []  [place], Pharm.D., BCPS [x]  Heide Guile, Pharm.D., BCPS []  Alycia Rossetti, Pharm.D., BCPS []  [place], [place].D., BCPS, AAHIVP []  Legrand Como, Pharm .D., BCPS, AAHIVP []  Elicia Lamp, Pharm.D.  Positive urine culture  []  Patient discharged without antimicrobial prescription and treatment is now indicated [x]  Organism is resistant to prescribed ED discharge antimicrobial []  Patient with positive blood cultures  Changes discussed with ED provider: Baron Sane PA-C New antibiotic prescription stop cephalexin and start Cipro 500mg  po daily x 7 days.  Attempting to contact pt.    Ileene Musa 10/21/2014, 8:38 AM

## 2014-10-23 ENCOUNTER — Encounter: Payer: Medicare Other | Admitting: Vascular Surgery

## 2014-10-23 DIAGNOSIS — D509 Iron deficiency anemia, unspecified: Secondary | ICD-10-CM | POA: Diagnosis not present

## 2014-10-23 DIAGNOSIS — N186 End stage renal disease: Secondary | ICD-10-CM | POA: Diagnosis not present

## 2014-10-23 DIAGNOSIS — N2581 Secondary hyperparathyroidism of renal origin: Secondary | ICD-10-CM | POA: Diagnosis not present

## 2014-10-23 DIAGNOSIS — D631 Anemia in chronic kidney disease: Secondary | ICD-10-CM | POA: Diagnosis not present

## 2014-10-23 DIAGNOSIS — Z992 Dependence on renal dialysis: Secondary | ICD-10-CM | POA: Diagnosis not present

## 2014-10-24 DIAGNOSIS — Z436 Encounter for attention to other artificial openings of urinary tract: Secondary | ICD-10-CM | POA: Diagnosis not present

## 2014-10-24 DIAGNOSIS — N1330 Unspecified hydronephrosis: Secondary | ICD-10-CM | POA: Diagnosis not present

## 2014-10-24 DIAGNOSIS — R319 Hematuria, unspecified: Secondary | ICD-10-CM | POA: Diagnosis not present

## 2014-10-25 DIAGNOSIS — D631 Anemia in chronic kidney disease: Secondary | ICD-10-CM | POA: Diagnosis not present

## 2014-10-25 DIAGNOSIS — N186 End stage renal disease: Secondary | ICD-10-CM | POA: Diagnosis not present

## 2014-10-25 DIAGNOSIS — D509 Iron deficiency anemia, unspecified: Secondary | ICD-10-CM | POA: Diagnosis not present

## 2014-10-25 DIAGNOSIS — Z992 Dependence on renal dialysis: Secondary | ICD-10-CM | POA: Diagnosis not present

## 2014-10-25 DIAGNOSIS — Z23 Encounter for immunization: Secondary | ICD-10-CM | POA: Diagnosis not present

## 2014-10-25 DIAGNOSIS — N2581 Secondary hyperparathyroidism of renal origin: Secondary | ICD-10-CM | POA: Diagnosis not present

## 2014-10-27 DIAGNOSIS — N186 End stage renal disease: Secondary | ICD-10-CM | POA: Diagnosis not present

## 2014-10-27 DIAGNOSIS — D509 Iron deficiency anemia, unspecified: Secondary | ICD-10-CM | POA: Diagnosis not present

## 2014-10-27 DIAGNOSIS — D631 Anemia in chronic kidney disease: Secondary | ICD-10-CM | POA: Diagnosis not present

## 2014-10-27 DIAGNOSIS — Z992 Dependence on renal dialysis: Secondary | ICD-10-CM | POA: Diagnosis not present

## 2014-10-27 DIAGNOSIS — Z23 Encounter for immunization: Secondary | ICD-10-CM | POA: Diagnosis not present

## 2014-10-27 DIAGNOSIS — N2581 Secondary hyperparathyroidism of renal origin: Secondary | ICD-10-CM | POA: Diagnosis not present

## 2014-10-28 ENCOUNTER — Emergency Department (HOSPITAL_COMMUNITY)
Admission: EM | Admit: 2014-10-28 | Discharge: 2014-10-28 | Disposition: A | Discharge status: Home / Self Care | Payer: Medicare Other | Attending: Emergency Medicine | Admitting: Emergency Medicine

## 2014-10-28 ENCOUNTER — Emergency Department (HOSPITAL_COMMUNITY): Payer: Medicare Other

## 2014-10-28 ENCOUNTER — Encounter (HOSPITAL_COMMUNITY): Payer: Self-pay

## 2014-10-28 DIAGNOSIS — Z792 Long term (current) use of antibiotics: Secondary | ICD-10-CM | POA: Insufficient documentation

## 2014-10-28 DIAGNOSIS — Z8739 Personal history of other diseases of the musculoskeletal system and connective tissue: Secondary | ICD-10-CM | POA: Diagnosis not present

## 2014-10-28 DIAGNOSIS — Z9861 Coronary angioplasty status: Secondary | ICD-10-CM | POA: Diagnosis not present

## 2014-10-28 DIAGNOSIS — Z992 Dependence on renal dialysis: Secondary | ICD-10-CM | POA: Insufficient documentation

## 2014-10-28 DIAGNOSIS — N400 Enlarged prostate without lower urinary tract symptoms: Secondary | ICD-10-CM | POA: Insufficient documentation

## 2014-10-28 DIAGNOSIS — Z8551 Personal history of malignant neoplasm of bladder: Secondary | ICD-10-CM | POA: Diagnosis not present

## 2014-10-28 DIAGNOSIS — E119 Type 2 diabetes mellitus without complications: Secondary | ICD-10-CM | POA: Diagnosis not present

## 2014-10-28 DIAGNOSIS — Z79899 Other long term (current) drug therapy: Secondary | ICD-10-CM | POA: Insufficient documentation

## 2014-10-28 DIAGNOSIS — I252 Old myocardial infarction: Secondary | ICD-10-CM | POA: Insufficient documentation

## 2014-10-28 DIAGNOSIS — R319 Hematuria, unspecified: Secondary | ICD-10-CM | POA: Diagnosis not present

## 2014-10-28 DIAGNOSIS — E785 Hyperlipidemia, unspecified: Secondary | ICD-10-CM | POA: Insufficient documentation

## 2014-10-28 DIAGNOSIS — Z87891 Personal history of nicotine dependence: Secondary | ICD-10-CM | POA: Diagnosis not present

## 2014-10-28 DIAGNOSIS — N186 End stage renal disease: Secondary | ICD-10-CM | POA: Insufficient documentation

## 2014-10-28 DIAGNOSIS — N281 Cyst of kidney, acquired: Secondary | ICD-10-CM | POA: Diagnosis not present

## 2014-10-28 DIAGNOSIS — R69 Illness, unspecified: Secondary | ICD-10-CM | POA: Diagnosis not present

## 2014-10-28 DIAGNOSIS — I2510 Atherosclerotic heart disease of native coronary artery without angina pectoris: Secondary | ICD-10-CM | POA: Diagnosis not present

## 2014-10-28 DIAGNOSIS — N261 Atrophy of kidney (terminal): Secondary | ICD-10-CM | POA: Diagnosis not present

## 2014-10-28 DIAGNOSIS — R58 Hemorrhage, not elsewhere classified: Secondary | ICD-10-CM

## 2014-10-28 DIAGNOSIS — I120 Hypertensive chronic kidney disease with stage 5 chronic kidney disease or end stage renal disease: Secondary | ICD-10-CM | POA: Insufficient documentation

## 2014-10-28 DIAGNOSIS — Z7902 Long term (current) use of antithrombotics/antiplatelets: Secondary | ICD-10-CM | POA: Diagnosis not present

## 2014-10-28 DIAGNOSIS — Z8673 Personal history of transient ischemic attack (TIA), and cerebral infarction without residual deficits: Secondary | ICD-10-CM | POA: Insufficient documentation

## 2014-10-28 DIAGNOSIS — N99530 Hemorrhage of other stoma of urinary tract: Secondary | ICD-10-CM | POA: Diagnosis not present

## 2014-10-28 DIAGNOSIS — Z7982 Long term (current) use of aspirin: Secondary | ICD-10-CM | POA: Diagnosis not present

## 2014-10-28 LAB — CBC WITH DIFFERENTIAL/PLATELET
BASOS PCT: 1 % (ref 0–1)
Basophils Absolute: 0 10*3/uL (ref 0.0–0.1)
EOS PCT: 4 % (ref 0–5)
Eosinophils Absolute: 0.3 10*3/uL (ref 0.0–0.7)
HCT: 29.6 % — ABNORMAL LOW (ref 39.0–52.0)
HEMOGLOBIN: 9.7 g/dL — AB (ref 13.0–17.0)
LYMPHS PCT: 20 % (ref 12–46)
Lymphs Abs: 1.5 10*3/uL (ref 0.7–4.0)
MCH: 34.2 pg — ABNORMAL HIGH (ref 26.0–34.0)
MCHC: 32.8 g/dL (ref 30.0–36.0)
MCV: 104.2 fL — ABNORMAL HIGH (ref 78.0–100.0)
Monocytes Absolute: 0.5 10*3/uL (ref 0.1–1.0)
Monocytes Relative: 6 % (ref 3–12)
NEUTROS ABS: 5.2 10*3/uL (ref 1.7–7.7)
Neutrophils Relative %: 69 % (ref 43–77)
PLATELETS: 323 10*3/uL (ref 150–400)
RBC: 2.84 MIL/uL — ABNORMAL LOW (ref 4.22–5.81)
RDW: 15.8 % — ABNORMAL HIGH (ref 11.5–15.5)
WBC: 7.5 10*3/uL (ref 4.0–10.5)

## 2014-10-28 LAB — COMPREHENSIVE METABOLIC PANEL
ALT: 9 U/L — ABNORMAL LOW (ref 17–63)
AST: 15 U/L (ref 15–41)
Albumin: 3.3 g/dL — ABNORMAL LOW (ref 3.5–5.0)
Alkaline Phosphatase: 52 U/L (ref 38–126)
Anion gap: 13 (ref 5–15)
BILIRUBIN TOTAL: 0.6 mg/dL (ref 0.3–1.2)
BUN: 27 mg/dL — ABNORMAL HIGH (ref 6–20)
CHLORIDE: 100 mmol/L — AB (ref 101–111)
CO2: 27 mmol/L (ref 22–32)
CREATININE: 5.93 mg/dL — AB (ref 0.61–1.24)
Calcium: 7.7 mg/dL — ABNORMAL LOW (ref 8.9–10.3)
GFR, EST AFRICAN AMERICAN: 9 mL/min — AB (ref >60)
GFR, EST NON AFRICAN AMERICAN: 8 mL/min — AB (ref >60)
Glucose, Bld: 151 mg/dL — ABNORMAL HIGH (ref 65–99)
Potassium: 3.4 mmol/L — ABNORMAL LOW (ref 3.5–5.1)
Sodium: 140 mmol/L (ref 135–145)
Total Protein: 6.8 g/dL (ref 6.5–8.1)

## 2014-10-28 LAB — URINALYSIS, ROUTINE W REFLEX MICROSCOPIC
Glucose, UA: NEGATIVE mg/dL
KETONES UR: NEGATIVE mg/dL
Nitrite: NEGATIVE
Protein, ur: >300 mg/dL — AB
SPECIFIC GRAVITY, URINE: 1.025 (ref 1.005–1.030)
UROBILINOGEN UA: 0.2 mg/dL (ref 0.0–1.0)
pH: 7.5 (ref 5.0–8.0)

## 2014-10-28 LAB — URINE MICROSCOPIC-ADD ON

## 2014-10-28 MED ORDER — IOHEXOL 300 MG/ML  SOLN
80.0000 mL | Freq: Once | INTRAMUSCULAR | Status: AC | PRN
  Administered 2014-10-28: 80 mL via INTRAVENOUS

## 2014-10-28 NOTE — Discharge Instructions (Signed)
As discussed, your evaluation today has been largely reassuring.  But, it is important that you monitor your condition carefully, and do not hesitate to return to the ED if you develop new, or concerning changes in your condition. ? ?Otherwise, please follow-up with your physician for appropriate ongoing care. ? ?

## 2014-10-28 NOTE — ED Provider Notes (Signed)
CSN: [id number]     Arrival date & time 10/28/14  1358 History   First MD Initiated Contact with Patient 10/28/14 1352     Chief Complaint  Patient presents with  . Hematuria     (Consider location/radiation/quality/duration/timing/severity/associated sxs/prior Treatment) HPI Patient presents with concern of ongoing hematuria from a right-sided nephrostomy tube. Patient has a history of bladder cancer, and with no functional lower urinary tract, has bilateral nephrostomy tubes. Patient had his tubes exchanged 4 days ago at [facility]. Since that time he has had persistent bleeding from the right side, none from the left. Patient notes prior episodes of bleeding, from both sides, but typically not has sustained. He also describes generalized weakness, without syncope, chest pain, other belly pain. Patient has multiple other medical issues, including dialysis. Last dialysis session yesterday. Patient takes all medication as directed, including Plavix.  Past Medical History  Diagnosis Date  . Dyslipidemia   . DJD (degenerative joint disease)   . Hypertension   . Diabetes mellitus   . BPH (benign prostatic hyperplasia)   . Coronary artery disease     a. 1997 s/p MI and prior RCA stenting;  b. 04/2013 MV: inflat ischemia;  c. 05/2013 Cath/PCI: LM 10-20, LAD 30-40p, LCX 95p (3.0x15 Xience DES), 76m, OM1/2 30, RCA 50p diff ISR, 90d (med Rx).  . Bladder cancer   . Stroke   . Myocardial infarction   . CKD (chronic kidney disease) stage 3, GFR 30-59 ml/min     dialysis   Past Surgical History  Procedure Laterality Date  . Cholecystectomy    . Esophagogastroduodenoscopy  06/08/2002    Soft stricture of the GE  junction with erosive esophagitis. The stricture was dilated to 84 [place] using a Maloney dilator. Swollen fold at GE junction on the gastric site which was biopsied for histology and was suspicious for sentinel folds.  . Colonoscopy  06/08/2002    Small polyp was oblated via  cold biopsy from the cecum and two were snared, one was at the rectosigmoid junction measuring about a centimeter, another one at the rectum which was smaller.  . Transurethral resection of bladder tumor      x7  . Coronary angioplasty  1997    3 stents  . Transurethral resection of bladder tumor  03/15/2012    Procedure: TRANSURETHRAL RESECTION OF BLADDER TUMOR (TURBT);  Surgeon: Marissa Nestle, MD;  Location: [facility];  Service: Urology;  Laterality: N/A;  . Cataract extraction w/phaco  06/20/2012    Procedure: CATARACT EXTRACTION PHACO AND INTRAOCULAR LENS PLACEMENT (IOC);  Surgeon: Tonny Branch, MD;  Location: [facility];  Service: Ophthalmology;  Laterality: Right;  CDE=16.59  . Cataract extraction w/phaco Left 07/04/2012    Procedure: CATARACT EXTRACTION PHACO AND INTRAOCULAR LENS PLACEMENT (IOC);  Surgeon: Tonny Branch, MD;  Location: [facility];  Service: Ophthalmology;  Laterality: Left;  CDE: 24.33  . Transurethral resection of bladder tumor N/A 09/29/2012    Procedure: TRANSURETHRAL RESECTION OF BLADDER TUMOR (TURBT);  Surgeon: Marissa Nestle, MD;  Location: [facility];  Service: Urology;  Laterality: N/A;  . Multiple tooth extractions    . Cystoscopy with biopsy N/A 04/27/2013    Procedure: CYSTOSCOPY WITH BLADDER BIOPSY & FULGURATION;  Surgeon: Marissa Nestle, MD;  Location: [facility];  Service: Urology;  Laterality: N/A;  . Left heart catheterization with coronary angiogram N/A 06/09/2013    Procedure: LEFT HEART CATHETERIZATION WITH CORONARY ANGIOGRAM;  Surgeon: Peter M Martinique, MD;  Location: [facility] CATH  LAB;  Service: Cardiovascular;  Laterality: N/A;  . Percutaneous coronary rotoblator intervention (pci-r) N/A 06/12/2013    Procedure: PERCUTANEOUS CORONARY ROTOBLATOR INTERVENTION (PCI-R);  Surgeon: Blane Ohara, MD;  Location: [facility] CATH LAB;  Service: Cardiovascular;  Laterality: N/A;  . Tonsillectomy    . Av fistula placement Right 09/05/2014    Procedure: BRACHIOCEPHALIC ARTERIOVENOUS (AV) FISTULA  CREATION RIGHT ARM;  Surgeon: Mal Misty, MD;  Location: [facility] OR;  Service: Vascular;  Laterality: Right;   Family History  Problem Relation Age of Onset  . Stroke Mother 67  . Diabetes Brother 21  . Heart disease Father   . Heart attack Father    History  Substance Use Topics  . Smoking status: Former Smoker -- 0.50 packs/day for 20 years    Types: Cigarettes    Quit date: 03/08/1981  . Smokeless tobacco: Never Used  . Alcohol Use: No    Review of Systems  Constitutional:       Per HPI, otherwise negative  HENT:       Per HPI, otherwise negative  Respiratory:       Per HPI, otherwise negative  Cardiovascular:       Per HPI, otherwise negative  Gastrointestinal: Negative for vomiting.  Endocrine:       Negative aside from HPI  Genitourinary:       Neg aside from HPI   Musculoskeletal:       Per HPI, otherwise negative  Skin: Negative.   Allergic/Immunologic: Negative for immunocompromised state.  Neurological: Negative for syncope.      Allergies  Aspirin; Celecoxib; Hydrocodone-acetaminophen; Morphine; Niacin; Other; Piroxicam; and Ciprofloxacin  Home Medications   Prior to Admission medications   Medication Sig Start Date End Date Taking? Authorizing Provider  amLODipine (NORVASC) 5 MG tablet Take 5 mg by mouth daily.    Historical Provider, MD  aspirin EC 81 MG tablet Take 81 mg by mouth daily.    Historical Provider, MD  atorvastatin (LIPITOR) 20 MG tablet Take 20 mg by mouth daily.    Historical Provider, MD  cephALEXin (KEFLEX) 500 MG capsule Take 1 capsule (500 mg total) by mouth 4 (four) times daily. 10/18/14   Milton Ferguson, MD  clopidogrel (PLAVIX) 75 MG tablet Take 75 mg by mouth daily.    Historical Provider, MD  ezetimibe (ZETIA) 10 MG tablet Take 10 mg by mouth every morning.     Historical Provider, MD  furosemide (LASIX) 40 MG tablet Take 40 mg by mouth daily.  08/25/14   Historical Provider, MD  gemfibrozil (LOPID) 600 MG tablet Take 600 mg by  mouth 2 (two) times daily before a meal.    Historical Provider, MD  hydrochlorothiazide (HYDRODIURIL) 25 MG tablet Take 25 mg by mouth daily.  08/20/14   Historical Provider, MD  ibuprofen (ADVIL,MOTRIN) 200 MG tablet Take 200 mg by mouth every 6 (six) hours as needed.    Historical Provider, MD  isosorbide mononitrate (IMDUR) 60 MG 24 hr tablet Take 1 tablet (60 mg total) by mouth daily. 08/20/14   Arnoldo Lenis, MD  metoprolol tartrate (LOPRESSOR) 25 MG tablet Take 0.5 tablets (12.5 mg total) by mouth 2 (two) times daily. 10/19/14   Arnoldo Lenis, MD  nitroGLYCERIN (NITROSTAT) 0.4 MG SL tablet Place 1 tablet (0.4 mg total) under the tongue every 5 (five) minutes x 3 doses as needed for chest pain. Chest Pain 02/19/14   Arnoldo Lenis, MD  omeprazole (PRILOSEC) 20 MG capsule Take 20  mg by mouth every morning.     Historical Provider, MD  tamsulosin (FLOMAX) 0.4 MG CAPS capsule Take 0.4 mg by mouth.     Historical Provider, MD   BP 121/58 mmHg  Pulse 83  Temp(Src) 98.3 F (36.8 C) (Oral)  Resp 20  Ht 5\' 8"  (1.727 m)  Wt 210 lb (95.255 kg)  BMI 31.94 kg/m2  SpO2 99% Physical Exam  Constitutional: He is oriented to person, place, and time. He appears well-developed.  Elderly male resting in bed, no distress  HENT:  Head: Normocephalic and atraumatic.  Eyes: Conjunctivae and EOM are normal.  Cardiovascular: Normal rate and regular rhythm.   Pulmonary/Chest: Effort normal. No stridor. No respiratory distress.    Abdominal: He exhibits no distension.  Genitourinary:     Musculoskeletal: He exhibits no edema.  Neurological: He is alert and oriented to person, place, and time.  Skin: Skin is warm and dry.  Psychiatric: He has a normal mood and affect.  Nursing note and vitals reviewed.   ED Course  Procedures (including critical care time) Labs Review Labs Reviewed  CBC WITH DIFFERENTIAL/PLATELET - Abnormal; Notable for the following:    RBC 2.84 (*)    Hemoglobin 9.7  (*)    HCT 29.6 (*)    MCV 104.2 (*)    MCH 34.2 (*)    RDW 15.8 (*)    All other components within normal limits  COMPREHENSIVE METABOLIC PANEL - Abnormal; Notable for the following:    Potassium 3.4 (*)    Chloride 100 (*)    Glucose, Bld 151 (*)    BUN 27 (*)    Creatinine, Ser 5.93 (*)    Calcium 7.7 (*)    Albumin 3.3 (*)    ALT 9 (*)    GFR calc non Af Amer 8 (*)    GFR calc Af Amer 9 (*)    All other components within normal limits  URINALYSIS, ROUTINE W REFLEX MICROSCOPIC (NOT AT [facility]) - Abnormal; Notable for the following:    Color, Urine RED (*)    APPearance CLOUDY (*)    Hgb urine dipstick LARGE (*)    Bilirubin Urine SMALL (*)    Protein, ur >300 (*)    Leukocytes, UA TRACE (*)    All other components within normal limits  URINE MICROSCOPIC-ADD ON    Imaging Review Ct Abdomen Pelvis W Contrast  10/28/2014   CLINICAL DATA:  Corrin hematuria from RIGHT nephrostomy tube for 2 weeks, history of bladder cancer post bladder surgery, nephrostomy tubes placed 4 days ago at [facility], spondylolysis since 03/2014, additional history of coronary disease post MI, diabetes, hypertension, stroke, stage 3 chronic kidney disease  EXAM: CT ABDOMEN AND PELVIS WITH CONTRAST  TECHNIQUE: Multidetector CT imaging of the abdomen and pelvis was performed using the standard protocol following bolus administration of intravenous contrast. Sagittal and coronal MPR images reconstructed from axial data set.  CONTRAST:  80mL OMNIPAQUE IOHEXOL 300 MG/ML SOLN IV. No oral contrast administered.  COMPARISON:  10/18/2014  FINDINGS: Lung bases clear.  BILATERAL nephrostomy tubes.  BILATERAL renal cortical atrophy.  Small LEFT renal cysts with additional indeterminate/intermediate attenuation lesion inferior pole 14 mm diameter, stable.  Small perinephric low-attenuation fluid collection at RIGHT kidney with high attenuation in the RIGHT renal collecting system likely representing blood.  Liver, spleen, pancreas,  and adrenal glands normal.  Gallbladder surgically absent.  Both ureters appear dilated to a contracted urinary bladder.  LEFT inguinal hernia containing fat.  Stomach and bowel loops unremarkable.  Scattered atherosclerotic calcifications.  No mass, adenopathy, free fluid free air, or acute osseous lesions.  IMPRESSION: BILATERAL renal cortical atrophy with placement of BILATERAL nephrostomy tubes.  Small LEFT renal cysts with indeterminate intermediate attenuation 14 mm LEFT renal nodule.  Small low-attenuation perinephric collection at RIGHT kidney with higher attenuation with RIGHT renal pelvis and distended RIGHT ureter question blood.  Prominent ureters bilaterally to a contracted urinary bladder.   Electronically Signed   By: Lavonia Dana M.D.   On: 10/28/2014 17:37    After the initial evaluation I reviewed the patient's chart, including interventional radiology notes from within the past week, recent ED evaluation here, for similar hematuria. At that point the patient had a noncontrast CT, unremarkable.  I discussed patient's case with our nephrologist. We discussed the CT findings, small drop in hemoglobin, recent treatment for UTI. As there is no interventional radiology currently available here, or at the patient's facility, patient will follow up with them tomorrow morning. On repeat exam the patient appears calm, vital signs reviewed similar, blood pressure 117/57, heart rate 80s.  MDM  Issue history of bladder cancer, bilateral nephrostomy tubes presents with concern of ongoing bleeding from the tube, but no new syncope, chest pain, dyspnea. Patient's evaluation here demonstrates continued bleeding, but only minor drop in hemoglobin, and a CT scan demonstrates a small fluid collection in the kidney, but no findings necessitating emergent intervention. Patient was discharged in stable condition to follow-up with his interventional radiology team tomorrow morning.   Carmin Muskrat,  MD 10/28/14 [phone]

## 2014-10-28 NOTE — ED Notes (Signed)
Patient reports that he has 2 kidney drains that have bleeding close to 2 weeks. Tubes changed on Wed at [facility] and bleeding has not stopped. [facility] was aware of bleeding at that time. Reports that he is allergic to cipro that was given to him last visit here

## 2014-10-29 DIAGNOSIS — I10 Essential (primary) hypertension: Secondary | ICD-10-CM | POA: Diagnosis not present

## 2014-10-29 DIAGNOSIS — Z87891 Personal history of nicotine dependence: Secondary | ICD-10-CM | POA: Diagnosis not present

## 2014-10-29 DIAGNOSIS — T849XXA Unspecified complication of internal orthopedic prosthetic device, implant and graft, initial encounter: Secondary | ICD-10-CM | POA: Diagnosis not present

## 2014-10-29 DIAGNOSIS — Z466 Encounter for fitting and adjustment of urinary device: Secondary | ICD-10-CM | POA: Diagnosis not present

## 2014-10-29 DIAGNOSIS — E119 Type 2 diabetes mellitus without complications: Secondary | ICD-10-CM | POA: Diagnosis not present

## 2014-10-29 DIAGNOSIS — R319 Hematuria, unspecified: Secondary | ICD-10-CM | POA: Diagnosis not present

## 2014-10-29 DIAGNOSIS — Z936 Other artificial openings of urinary tract status: Secondary | ICD-10-CM | POA: Diagnosis not present

## 2014-10-29 DIAGNOSIS — T83498A Other mechanical complication of other prosthetic devices, implants and grafts of genital tract, initial encounter: Secondary | ICD-10-CM | POA: Diagnosis not present

## 2014-10-29 DIAGNOSIS — Z794 Long term (current) use of insulin: Secondary | ICD-10-CM | POA: Diagnosis not present

## 2014-10-29 DIAGNOSIS — Z436 Encounter for attention to other artificial openings of urinary tract: Secondary | ICD-10-CM | POA: Diagnosis not present

## 2014-10-30 DIAGNOSIS — Z23 Encounter for immunization: Secondary | ICD-10-CM | POA: Diagnosis not present

## 2014-10-30 DIAGNOSIS — D631 Anemia in chronic kidney disease: Secondary | ICD-10-CM | POA: Diagnosis not present

## 2014-10-30 DIAGNOSIS — Z992 Dependence on renal dialysis: Secondary | ICD-10-CM | POA: Diagnosis not present

## 2014-10-30 DIAGNOSIS — N2581 Secondary hyperparathyroidism of renal origin: Secondary | ICD-10-CM | POA: Diagnosis not present

## 2014-10-30 DIAGNOSIS — D509 Iron deficiency anemia, unspecified: Secondary | ICD-10-CM | POA: Diagnosis not present

## 2014-10-30 DIAGNOSIS — N186 End stage renal disease: Secondary | ICD-10-CM | POA: Diagnosis not present

## 2014-11-01 DIAGNOSIS — D509 Iron deficiency anemia, unspecified: Secondary | ICD-10-CM | POA: Diagnosis not present

## 2014-11-01 DIAGNOSIS — D631 Anemia in chronic kidney disease: Secondary | ICD-10-CM | POA: Diagnosis not present

## 2014-11-01 DIAGNOSIS — Z23 Encounter for immunization: Secondary | ICD-10-CM | POA: Diagnosis not present

## 2014-11-01 DIAGNOSIS — Z992 Dependence on renal dialysis: Secondary | ICD-10-CM | POA: Diagnosis not present

## 2014-11-01 DIAGNOSIS — N2581 Secondary hyperparathyroidism of renal origin: Secondary | ICD-10-CM | POA: Diagnosis not present

## 2014-11-01 DIAGNOSIS — N186 End stage renal disease: Secondary | ICD-10-CM | POA: Diagnosis not present

## 2014-11-03 DIAGNOSIS — D509 Iron deficiency anemia, unspecified: Secondary | ICD-10-CM | POA: Diagnosis not present

## 2014-11-03 DIAGNOSIS — N2581 Secondary hyperparathyroidism of renal origin: Secondary | ICD-10-CM | POA: Diagnosis not present

## 2014-11-03 DIAGNOSIS — Z23 Encounter for immunization: Secondary | ICD-10-CM | POA: Diagnosis not present

## 2014-11-03 DIAGNOSIS — Z992 Dependence on renal dialysis: Secondary | ICD-10-CM | POA: Diagnosis not present

## 2014-11-03 DIAGNOSIS — D631 Anemia in chronic kidney disease: Secondary | ICD-10-CM | POA: Diagnosis not present

## 2014-11-03 DIAGNOSIS — N186 End stage renal disease: Secondary | ICD-10-CM | POA: Diagnosis not present

## 2014-11-06 DIAGNOSIS — N2581 Secondary hyperparathyroidism of renal origin: Secondary | ICD-10-CM | POA: Diagnosis not present

## 2014-11-06 DIAGNOSIS — D509 Iron deficiency anemia, unspecified: Secondary | ICD-10-CM | POA: Diagnosis not present

## 2014-11-06 DIAGNOSIS — Z23 Encounter for immunization: Secondary | ICD-10-CM | POA: Diagnosis not present

## 2014-11-06 DIAGNOSIS — N186 End stage renal disease: Secondary | ICD-10-CM | POA: Diagnosis not present

## 2014-11-06 DIAGNOSIS — Z992 Dependence on renal dialysis: Secondary | ICD-10-CM | POA: Diagnosis not present

## 2014-11-06 DIAGNOSIS — D631 Anemia in chronic kidney disease: Secondary | ICD-10-CM | POA: Diagnosis not present

## 2014-11-08 DIAGNOSIS — N2581 Secondary hyperparathyroidism of renal origin: Secondary | ICD-10-CM | POA: Diagnosis not present

## 2014-11-08 DIAGNOSIS — N186 End stage renal disease: Secondary | ICD-10-CM | POA: Diagnosis not present

## 2014-11-08 DIAGNOSIS — D509 Iron deficiency anemia, unspecified: Secondary | ICD-10-CM | POA: Diagnosis not present

## 2014-11-08 DIAGNOSIS — Z992 Dependence on renal dialysis: Secondary | ICD-10-CM | POA: Diagnosis not present

## 2014-11-08 DIAGNOSIS — Z23 Encounter for immunization: Secondary | ICD-10-CM | POA: Diagnosis not present

## 2014-11-08 DIAGNOSIS — D631 Anemia in chronic kidney disease: Secondary | ICD-10-CM | POA: Diagnosis not present

## 2014-11-10 DIAGNOSIS — Z23 Encounter for immunization: Secondary | ICD-10-CM | POA: Diagnosis not present

## 2014-11-10 DIAGNOSIS — N186 End stage renal disease: Secondary | ICD-10-CM | POA: Diagnosis not present

## 2014-11-10 DIAGNOSIS — D631 Anemia in chronic kidney disease: Secondary | ICD-10-CM | POA: Diagnosis not present

## 2014-11-10 DIAGNOSIS — D509 Iron deficiency anemia, unspecified: Secondary | ICD-10-CM | POA: Diagnosis not present

## 2014-11-10 DIAGNOSIS — Z992 Dependence on renal dialysis: Secondary | ICD-10-CM | POA: Diagnosis not present

## 2014-11-10 DIAGNOSIS — N2581 Secondary hyperparathyroidism of renal origin: Secondary | ICD-10-CM | POA: Diagnosis not present

## 2014-11-13 DIAGNOSIS — Z23 Encounter for immunization: Secondary | ICD-10-CM | POA: Diagnosis not present

## 2014-11-13 DIAGNOSIS — Z992 Dependence on renal dialysis: Secondary | ICD-10-CM | POA: Diagnosis not present

## 2014-11-13 DIAGNOSIS — D509 Iron deficiency anemia, unspecified: Secondary | ICD-10-CM | POA: Diagnosis not present

## 2014-11-13 DIAGNOSIS — N2581 Secondary hyperparathyroidism of renal origin: Secondary | ICD-10-CM | POA: Diagnosis not present

## 2014-11-13 DIAGNOSIS — N186 End stage renal disease: Secondary | ICD-10-CM | POA: Diagnosis not present

## 2014-11-13 DIAGNOSIS — D631 Anemia in chronic kidney disease: Secondary | ICD-10-CM | POA: Diagnosis not present

## 2014-11-15 DIAGNOSIS — N186 End stage renal disease: Secondary | ICD-10-CM | POA: Diagnosis not present

## 2014-11-15 DIAGNOSIS — Z992 Dependence on renal dialysis: Secondary | ICD-10-CM | POA: Diagnosis not present

## 2014-11-15 DIAGNOSIS — N2581 Secondary hyperparathyroidism of renal origin: Secondary | ICD-10-CM | POA: Diagnosis not present

## 2014-11-15 DIAGNOSIS — Z23 Encounter for immunization: Secondary | ICD-10-CM | POA: Diagnosis not present

## 2014-11-15 DIAGNOSIS — D509 Iron deficiency anemia, unspecified: Secondary | ICD-10-CM | POA: Diagnosis not present

## 2014-11-15 DIAGNOSIS — D631 Anemia in chronic kidney disease: Secondary | ICD-10-CM | POA: Diagnosis not present

## 2014-11-17 DIAGNOSIS — N186 End stage renal disease: Secondary | ICD-10-CM | POA: Diagnosis not present

## 2014-11-17 DIAGNOSIS — Z23 Encounter for immunization: Secondary | ICD-10-CM | POA: Diagnosis not present

## 2014-11-17 DIAGNOSIS — D631 Anemia in chronic kidney disease: Secondary | ICD-10-CM | POA: Diagnosis not present

## 2014-11-17 DIAGNOSIS — D509 Iron deficiency anemia, unspecified: Secondary | ICD-10-CM | POA: Diagnosis not present

## 2014-11-17 DIAGNOSIS — Z992 Dependence on renal dialysis: Secondary | ICD-10-CM | POA: Diagnosis not present

## 2014-11-17 DIAGNOSIS — N2581 Secondary hyperparathyroidism of renal origin: Secondary | ICD-10-CM | POA: Diagnosis not present

## 2014-11-19 ENCOUNTER — Other Ambulatory Visit: Payer: Self-pay

## 2014-11-20 ENCOUNTER — Other Ambulatory Visit: Payer: Self-pay

## 2014-11-20 ENCOUNTER — Other Ambulatory Visit: Payer: Self-pay | Admitting: *Deleted

## 2014-11-20 DIAGNOSIS — Z992 Dependence on renal dialysis: Secondary | ICD-10-CM | POA: Diagnosis not present

## 2014-11-20 DIAGNOSIS — N2581 Secondary hyperparathyroidism of renal origin: Secondary | ICD-10-CM | POA: Diagnosis not present

## 2014-11-20 DIAGNOSIS — D509 Iron deficiency anemia, unspecified: Secondary | ICD-10-CM | POA: Diagnosis not present

## 2014-11-20 DIAGNOSIS — Z23 Encounter for immunization: Secondary | ICD-10-CM | POA: Diagnosis not present

## 2014-11-20 DIAGNOSIS — D631 Anemia in chronic kidney disease: Secondary | ICD-10-CM | POA: Diagnosis not present

## 2014-11-20 DIAGNOSIS — N186 End stage renal disease: Secondary | ICD-10-CM | POA: Diagnosis not present

## 2014-11-20 MED ORDER — METOPROLOL TARTRATE 25 MG PO TABS: 12.5000 mg | ORAL_TABLET | Freq: Two times a day (BID) | ORAL | Status: DC

## 2014-11-21 DIAGNOSIS — C679 Malignant neoplasm of bladder, unspecified: Secondary | ICD-10-CM | POA: Diagnosis not present

## 2014-11-21 DIAGNOSIS — Z436 Encounter for attention to other artificial openings of urinary tract: Secondary | ICD-10-CM | POA: Diagnosis not present

## 2014-11-22 DIAGNOSIS — Z23 Encounter for immunization: Secondary | ICD-10-CM | POA: Diagnosis not present

## 2014-11-22 DIAGNOSIS — D631 Anemia in chronic kidney disease: Secondary | ICD-10-CM | POA: Diagnosis not present

## 2014-11-22 DIAGNOSIS — N186 End stage renal disease: Secondary | ICD-10-CM | POA: Diagnosis not present

## 2014-11-22 DIAGNOSIS — N2581 Secondary hyperparathyroidism of renal origin: Secondary | ICD-10-CM | POA: Diagnosis not present

## 2014-11-22 DIAGNOSIS — D509 Iron deficiency anemia, unspecified: Secondary | ICD-10-CM | POA: Diagnosis not present

## 2014-11-22 DIAGNOSIS — Z992 Dependence on renal dialysis: Secondary | ICD-10-CM | POA: Diagnosis not present

## 2014-11-24 DIAGNOSIS — D631 Anemia in chronic kidney disease: Secondary | ICD-10-CM | POA: Diagnosis not present

## 2014-11-24 DIAGNOSIS — D509 Iron deficiency anemia, unspecified: Secondary | ICD-10-CM | POA: Diagnosis not present

## 2014-11-24 DIAGNOSIS — Z992 Dependence on renal dialysis: Secondary | ICD-10-CM | POA: Diagnosis not present

## 2014-11-24 DIAGNOSIS — N2581 Secondary hyperparathyroidism of renal origin: Secondary | ICD-10-CM | POA: Diagnosis not present

## 2014-11-24 DIAGNOSIS — N186 End stage renal disease: Secondary | ICD-10-CM | POA: Diagnosis not present

## 2014-11-27 DIAGNOSIS — Z992 Dependence on renal dialysis: Secondary | ICD-10-CM | POA: Diagnosis not present

## 2014-11-27 DIAGNOSIS — N186 End stage renal disease: Secondary | ICD-10-CM | POA: Diagnosis not present

## 2014-11-27 DIAGNOSIS — D509 Iron deficiency anemia, unspecified: Secondary | ICD-10-CM | POA: Diagnosis not present

## 2014-11-27 DIAGNOSIS — D631 Anemia in chronic kidney disease: Secondary | ICD-10-CM | POA: Diagnosis not present

## 2014-11-27 DIAGNOSIS — N2581 Secondary hyperparathyroidism of renal origin: Secondary | ICD-10-CM | POA: Diagnosis not present

## 2014-11-29 DIAGNOSIS — D631 Anemia in chronic kidney disease: Secondary | ICD-10-CM | POA: Diagnosis not present

## 2014-11-29 DIAGNOSIS — Z992 Dependence on renal dialysis: Secondary | ICD-10-CM | POA: Diagnosis not present

## 2014-11-29 DIAGNOSIS — D509 Iron deficiency anemia, unspecified: Secondary | ICD-10-CM | POA: Diagnosis not present

## 2014-11-29 DIAGNOSIS — N2581 Secondary hyperparathyroidism of renal origin: Secondary | ICD-10-CM | POA: Diagnosis not present

## 2014-11-29 DIAGNOSIS — N186 End stage renal disease: Secondary | ICD-10-CM | POA: Diagnosis not present

## 2014-12-01 DIAGNOSIS — Z992 Dependence on renal dialysis: Secondary | ICD-10-CM | POA: Diagnosis not present

## 2014-12-01 DIAGNOSIS — D631 Anemia in chronic kidney disease: Secondary | ICD-10-CM | POA: Diagnosis not present

## 2014-12-01 DIAGNOSIS — N2581 Secondary hyperparathyroidism of renal origin: Secondary | ICD-10-CM | POA: Diagnosis not present

## 2014-12-01 DIAGNOSIS — D509 Iron deficiency anemia, unspecified: Secondary | ICD-10-CM | POA: Diagnosis not present

## 2014-12-01 DIAGNOSIS — N186 End stage renal disease: Secondary | ICD-10-CM | POA: Diagnosis not present

## 2014-12-03 DIAGNOSIS — E6609 Other obesity due to excess calories: Secondary | ICD-10-CM | POA: Diagnosis not present

## 2014-12-03 DIAGNOSIS — Z0001 Encounter for general adult medical examination with abnormal findings: Secondary | ICD-10-CM | POA: Diagnosis not present

## 2014-12-03 DIAGNOSIS — Z6831 Body mass index (BMI) 31.0-31.9, adult: Secondary | ICD-10-CM | POA: Diagnosis not present

## 2014-12-03 DIAGNOSIS — N185 Chronic kidney disease, stage 5: Secondary | ICD-10-CM | POA: Diagnosis not present

## 2014-12-03 DIAGNOSIS — Z1389 Encounter for screening for other disorder: Secondary | ICD-10-CM | POA: Diagnosis not present

## 2014-12-03 DIAGNOSIS — E782 Mixed hyperlipidemia: Secondary | ICD-10-CM | POA: Diagnosis not present

## 2014-12-04 DIAGNOSIS — D509 Iron deficiency anemia, unspecified: Secondary | ICD-10-CM | POA: Diagnosis not present

## 2014-12-04 DIAGNOSIS — N2581 Secondary hyperparathyroidism of renal origin: Secondary | ICD-10-CM | POA: Diagnosis not present

## 2014-12-04 DIAGNOSIS — D631 Anemia in chronic kidney disease: Secondary | ICD-10-CM | POA: Diagnosis not present

## 2014-12-04 DIAGNOSIS — N186 End stage renal disease: Secondary | ICD-10-CM | POA: Diagnosis not present

## 2014-12-04 DIAGNOSIS — E119 Type 2 diabetes mellitus without complications: Secondary | ICD-10-CM | POA: Diagnosis not present

## 2014-12-04 DIAGNOSIS — Z992 Dependence on renal dialysis: Secondary | ICD-10-CM | POA: Diagnosis not present

## 2014-12-05 DIAGNOSIS — R32 Unspecified urinary incontinence: Secondary | ICD-10-CM | POA: Diagnosis not present

## 2014-12-05 DIAGNOSIS — C678 Malignant neoplasm of overlapping sites of bladder: Secondary | ICD-10-CM | POA: Diagnosis not present

## 2014-12-06 DIAGNOSIS — N186 End stage renal disease: Secondary | ICD-10-CM | POA: Diagnosis not present

## 2014-12-06 DIAGNOSIS — D509 Iron deficiency anemia, unspecified: Secondary | ICD-10-CM | POA: Diagnosis not present

## 2014-12-06 DIAGNOSIS — Z992 Dependence on renal dialysis: Secondary | ICD-10-CM | POA: Diagnosis not present

## 2014-12-06 DIAGNOSIS — D631 Anemia in chronic kidney disease: Secondary | ICD-10-CM | POA: Diagnosis not present

## 2014-12-06 DIAGNOSIS — N2581 Secondary hyperparathyroidism of renal origin: Secondary | ICD-10-CM | POA: Diagnosis not present

## 2014-12-08 DIAGNOSIS — D631 Anemia in chronic kidney disease: Secondary | ICD-10-CM | POA: Diagnosis not present

## 2014-12-08 DIAGNOSIS — Z992 Dependence on renal dialysis: Secondary | ICD-10-CM | POA: Diagnosis not present

## 2014-12-08 DIAGNOSIS — N186 End stage renal disease: Secondary | ICD-10-CM | POA: Diagnosis not present

## 2014-12-08 DIAGNOSIS — N2581 Secondary hyperparathyroidism of renal origin: Secondary | ICD-10-CM | POA: Diagnosis not present

## 2014-12-08 DIAGNOSIS — D509 Iron deficiency anemia, unspecified: Secondary | ICD-10-CM | POA: Diagnosis not present

## 2014-12-11 DIAGNOSIS — Z992 Dependence on renal dialysis: Secondary | ICD-10-CM | POA: Diagnosis not present

## 2014-12-11 DIAGNOSIS — N186 End stage renal disease: Secondary | ICD-10-CM | POA: Diagnosis not present

## 2014-12-11 DIAGNOSIS — D509 Iron deficiency anemia, unspecified: Secondary | ICD-10-CM | POA: Diagnosis not present

## 2014-12-11 DIAGNOSIS — D631 Anemia in chronic kidney disease: Secondary | ICD-10-CM | POA: Diagnosis not present

## 2014-12-11 DIAGNOSIS — N2581 Secondary hyperparathyroidism of renal origin: Secondary | ICD-10-CM | POA: Diagnosis not present

## 2014-12-13 DIAGNOSIS — N2581 Secondary hyperparathyroidism of renal origin: Secondary | ICD-10-CM | POA: Diagnosis not present

## 2014-12-13 DIAGNOSIS — Z992 Dependence on renal dialysis: Secondary | ICD-10-CM | POA: Diagnosis not present

## 2014-12-13 DIAGNOSIS — D509 Iron deficiency anemia, unspecified: Secondary | ICD-10-CM | POA: Diagnosis not present

## 2014-12-13 DIAGNOSIS — N186 End stage renal disease: Secondary | ICD-10-CM | POA: Diagnosis not present

## 2014-12-13 DIAGNOSIS — D631 Anemia in chronic kidney disease: Secondary | ICD-10-CM | POA: Diagnosis not present

## 2014-12-15 DIAGNOSIS — D509 Iron deficiency anemia, unspecified: Secondary | ICD-10-CM | POA: Diagnosis not present

## 2014-12-15 DIAGNOSIS — Z992 Dependence on renal dialysis: Secondary | ICD-10-CM | POA: Diagnosis not present

## 2014-12-15 DIAGNOSIS — D631 Anemia in chronic kidney disease: Secondary | ICD-10-CM | POA: Diagnosis not present

## 2014-12-15 DIAGNOSIS — N2581 Secondary hyperparathyroidism of renal origin: Secondary | ICD-10-CM | POA: Diagnosis not present

## 2014-12-15 DIAGNOSIS — N186 End stage renal disease: Secondary | ICD-10-CM | POA: Diagnosis not present

## 2014-12-18 DIAGNOSIS — N2581 Secondary hyperparathyroidism of renal origin: Secondary | ICD-10-CM | POA: Diagnosis not present

## 2014-12-18 DIAGNOSIS — Z992 Dependence on renal dialysis: Secondary | ICD-10-CM | POA: Diagnosis not present

## 2014-12-18 DIAGNOSIS — D509 Iron deficiency anemia, unspecified: Secondary | ICD-10-CM | POA: Diagnosis not present

## 2014-12-18 DIAGNOSIS — D631 Anemia in chronic kidney disease: Secondary | ICD-10-CM | POA: Diagnosis not present

## 2014-12-18 DIAGNOSIS — N186 End stage renal disease: Secondary | ICD-10-CM | POA: Diagnosis not present

## 2014-12-19 ENCOUNTER — Other Ambulatory Visit: Payer: Self-pay | Admitting: Cardiology

## 2014-12-19 DIAGNOSIS — N135 Crossing vessel and stricture of ureter without hydronephrosis: Secondary | ICD-10-CM | POA: Diagnosis not present

## 2014-12-19 DIAGNOSIS — N184 Chronic kidney disease, stage 4 (severe): Secondary | ICD-10-CM | POA: Diagnosis not present

## 2014-12-19 DIAGNOSIS — Z436 Encounter for attention to other artificial openings of urinary tract: Secondary | ICD-10-CM | POA: Diagnosis not present

## 2014-12-20 DIAGNOSIS — D631 Anemia in chronic kidney disease: Secondary | ICD-10-CM | POA: Diagnosis not present

## 2014-12-20 DIAGNOSIS — D509 Iron deficiency anemia, unspecified: Secondary | ICD-10-CM | POA: Diagnosis not present

## 2014-12-20 DIAGNOSIS — N2581 Secondary hyperparathyroidism of renal origin: Secondary | ICD-10-CM | POA: Diagnosis not present

## 2014-12-20 DIAGNOSIS — N186 End stage renal disease: Secondary | ICD-10-CM | POA: Diagnosis not present

## 2014-12-20 DIAGNOSIS — Z992 Dependence on renal dialysis: Secondary | ICD-10-CM | POA: Diagnosis not present

## 2014-12-22 DIAGNOSIS — D509 Iron deficiency anemia, unspecified: Secondary | ICD-10-CM | POA: Diagnosis not present

## 2014-12-22 DIAGNOSIS — N2581 Secondary hyperparathyroidism of renal origin: Secondary | ICD-10-CM | POA: Diagnosis not present

## 2014-12-22 DIAGNOSIS — D631 Anemia in chronic kidney disease: Secondary | ICD-10-CM | POA: Diagnosis not present

## 2014-12-22 DIAGNOSIS — Z992 Dependence on renal dialysis: Secondary | ICD-10-CM | POA: Diagnosis not present

## 2014-12-22 DIAGNOSIS — N186 End stage renal disease: Secondary | ICD-10-CM | POA: Diagnosis not present

## 2014-12-23 DIAGNOSIS — N186 End stage renal disease: Secondary | ICD-10-CM | POA: Diagnosis not present

## 2014-12-23 DIAGNOSIS — Z992 Dependence on renal dialysis: Secondary | ICD-10-CM | POA: Diagnosis not present

## 2014-12-25 DIAGNOSIS — Z992 Dependence on renal dialysis: Secondary | ICD-10-CM | POA: Diagnosis not present

## 2014-12-25 DIAGNOSIS — D631 Anemia in chronic kidney disease: Secondary | ICD-10-CM | POA: Diagnosis not present

## 2014-12-25 DIAGNOSIS — D509 Iron deficiency anemia, unspecified: Secondary | ICD-10-CM | POA: Diagnosis not present

## 2014-12-25 DIAGNOSIS — N2581 Secondary hyperparathyroidism of renal origin: Secondary | ICD-10-CM | POA: Diagnosis not present

## 2014-12-25 DIAGNOSIS — N186 End stage renal disease: Secondary | ICD-10-CM | POA: Diagnosis not present

## 2014-12-27 DIAGNOSIS — D631 Anemia in chronic kidney disease: Secondary | ICD-10-CM | POA: Diagnosis not present

## 2014-12-27 DIAGNOSIS — N2581 Secondary hyperparathyroidism of renal origin: Secondary | ICD-10-CM | POA: Diagnosis not present

## 2014-12-27 DIAGNOSIS — N186 End stage renal disease: Secondary | ICD-10-CM | POA: Diagnosis not present

## 2014-12-27 DIAGNOSIS — Z992 Dependence on renal dialysis: Secondary | ICD-10-CM | POA: Diagnosis not present

## 2014-12-27 DIAGNOSIS — D509 Iron deficiency anemia, unspecified: Secondary | ICD-10-CM | POA: Diagnosis not present

## 2014-12-29 DIAGNOSIS — D509 Iron deficiency anemia, unspecified: Secondary | ICD-10-CM | POA: Diagnosis not present

## 2014-12-29 DIAGNOSIS — Z992 Dependence on renal dialysis: Secondary | ICD-10-CM | POA: Diagnosis not present

## 2014-12-29 DIAGNOSIS — N186 End stage renal disease: Secondary | ICD-10-CM | POA: Diagnosis not present

## 2014-12-29 DIAGNOSIS — N2581 Secondary hyperparathyroidism of renal origin: Secondary | ICD-10-CM | POA: Diagnosis not present

## 2014-12-29 DIAGNOSIS — D631 Anemia in chronic kidney disease: Secondary | ICD-10-CM | POA: Diagnosis not present

## 2015-01-01 DIAGNOSIS — N186 End stage renal disease: Secondary | ICD-10-CM | POA: Diagnosis not present

## 2015-01-01 DIAGNOSIS — Z992 Dependence on renal dialysis: Secondary | ICD-10-CM | POA: Diagnosis not present

## 2015-01-01 DIAGNOSIS — D631 Anemia in chronic kidney disease: Secondary | ICD-10-CM | POA: Diagnosis not present

## 2015-01-01 DIAGNOSIS — D509 Iron deficiency anemia, unspecified: Secondary | ICD-10-CM | POA: Diagnosis not present

## 2015-01-01 DIAGNOSIS — N2581 Secondary hyperparathyroidism of renal origin: Secondary | ICD-10-CM | POA: Diagnosis not present

## 2015-01-02 DIAGNOSIS — S93331A Other subluxation of right foot, initial encounter: Secondary | ICD-10-CM | POA: Diagnosis not present

## 2015-01-02 DIAGNOSIS — L110 Acquired keratosis follicularis: Secondary | ICD-10-CM | POA: Diagnosis not present

## 2015-01-02 DIAGNOSIS — B351 Tinea unguium: Secondary | ICD-10-CM | POA: Diagnosis not present

## 2015-01-02 DIAGNOSIS — I739 Peripheral vascular disease, unspecified: Secondary | ICD-10-CM | POA: Diagnosis not present

## 2015-01-03 DIAGNOSIS — D631 Anemia in chronic kidney disease: Secondary | ICD-10-CM | POA: Diagnosis not present

## 2015-01-03 DIAGNOSIS — D509 Iron deficiency anemia, unspecified: Secondary | ICD-10-CM | POA: Diagnosis not present

## 2015-01-03 DIAGNOSIS — N186 End stage renal disease: Secondary | ICD-10-CM | POA: Diagnosis not present

## 2015-01-03 DIAGNOSIS — Z992 Dependence on renal dialysis: Secondary | ICD-10-CM | POA: Diagnosis not present

## 2015-01-03 DIAGNOSIS — N2581 Secondary hyperparathyroidism of renal origin: Secondary | ICD-10-CM | POA: Diagnosis not present

## 2015-01-05 DIAGNOSIS — N2581 Secondary hyperparathyroidism of renal origin: Secondary | ICD-10-CM | POA: Diagnosis not present

## 2015-01-05 DIAGNOSIS — D631 Anemia in chronic kidney disease: Secondary | ICD-10-CM | POA: Diagnosis not present

## 2015-01-05 DIAGNOSIS — D509 Iron deficiency anemia, unspecified: Secondary | ICD-10-CM | POA: Diagnosis not present

## 2015-01-05 DIAGNOSIS — Z992 Dependence on renal dialysis: Secondary | ICD-10-CM | POA: Diagnosis not present

## 2015-01-05 DIAGNOSIS — N186 End stage renal disease: Secondary | ICD-10-CM | POA: Diagnosis not present

## 2015-01-08 DIAGNOSIS — N186 End stage renal disease: Secondary | ICD-10-CM | POA: Diagnosis not present

## 2015-01-08 DIAGNOSIS — D509 Iron deficiency anemia, unspecified: Secondary | ICD-10-CM | POA: Diagnosis not present

## 2015-01-08 DIAGNOSIS — N2581 Secondary hyperparathyroidism of renal origin: Secondary | ICD-10-CM | POA: Diagnosis not present

## 2015-01-08 DIAGNOSIS — D631 Anemia in chronic kidney disease: Secondary | ICD-10-CM | POA: Diagnosis not present

## 2015-01-08 DIAGNOSIS — Z992 Dependence on renal dialysis: Secondary | ICD-10-CM | POA: Diagnosis not present

## 2015-01-10 DIAGNOSIS — D509 Iron deficiency anemia, unspecified: Secondary | ICD-10-CM | POA: Diagnosis not present

## 2015-01-10 DIAGNOSIS — N186 End stage renal disease: Secondary | ICD-10-CM | POA: Diagnosis not present

## 2015-01-10 DIAGNOSIS — Z992 Dependence on renal dialysis: Secondary | ICD-10-CM | POA: Diagnosis not present

## 2015-01-10 DIAGNOSIS — N2581 Secondary hyperparathyroidism of renal origin: Secondary | ICD-10-CM | POA: Diagnosis not present

## 2015-01-10 DIAGNOSIS — D631 Anemia in chronic kidney disease: Secondary | ICD-10-CM | POA: Diagnosis not present

## 2015-01-12 DIAGNOSIS — D509 Iron deficiency anemia, unspecified: Secondary | ICD-10-CM | POA: Diagnosis not present

## 2015-01-12 DIAGNOSIS — N186 End stage renal disease: Secondary | ICD-10-CM | POA: Diagnosis not present

## 2015-01-12 DIAGNOSIS — D631 Anemia in chronic kidney disease: Secondary | ICD-10-CM | POA: Diagnosis not present

## 2015-01-12 DIAGNOSIS — Z992 Dependence on renal dialysis: Secondary | ICD-10-CM | POA: Diagnosis not present

## 2015-01-12 DIAGNOSIS — N2581 Secondary hyperparathyroidism of renal origin: Secondary | ICD-10-CM | POA: Diagnosis not present

## 2015-01-15 DIAGNOSIS — N186 End stage renal disease: Secondary | ICD-10-CM | POA: Diagnosis not present

## 2015-01-15 DIAGNOSIS — D509 Iron deficiency anemia, unspecified: Secondary | ICD-10-CM | POA: Diagnosis not present

## 2015-01-15 DIAGNOSIS — Z992 Dependence on renal dialysis: Secondary | ICD-10-CM | POA: Diagnosis not present

## 2015-01-15 DIAGNOSIS — D631 Anemia in chronic kidney disease: Secondary | ICD-10-CM | POA: Diagnosis not present

## 2015-01-15 DIAGNOSIS — N2581 Secondary hyperparathyroidism of renal origin: Secondary | ICD-10-CM | POA: Diagnosis not present

## 2015-01-16 DIAGNOSIS — N1330 Unspecified hydronephrosis: Secondary | ICD-10-CM | POA: Diagnosis not present

## 2015-01-16 DIAGNOSIS — Z436 Encounter for attention to other artificial openings of urinary tract: Secondary | ICD-10-CM | POA: Diagnosis not present

## 2015-01-16 DIAGNOSIS — N184 Chronic kidney disease, stage 4 (severe): Secondary | ICD-10-CM | POA: Diagnosis not present

## 2015-01-16 DIAGNOSIS — Z466 Encounter for fitting and adjustment of urinary device: Secondary | ICD-10-CM | POA: Diagnosis not present

## 2015-01-17 DIAGNOSIS — D509 Iron deficiency anemia, unspecified: Secondary | ICD-10-CM | POA: Diagnosis not present

## 2015-01-17 DIAGNOSIS — N186 End stage renal disease: Secondary | ICD-10-CM | POA: Diagnosis not present

## 2015-01-17 DIAGNOSIS — Z992 Dependence on renal dialysis: Secondary | ICD-10-CM | POA: Diagnosis not present

## 2015-01-17 DIAGNOSIS — D631 Anemia in chronic kidney disease: Secondary | ICD-10-CM | POA: Diagnosis not present

## 2015-01-17 DIAGNOSIS — N2581 Secondary hyperparathyroidism of renal origin: Secondary | ICD-10-CM | POA: Diagnosis not present

## 2015-01-19 DIAGNOSIS — D509 Iron deficiency anemia, unspecified: Secondary | ICD-10-CM | POA: Diagnosis not present

## 2015-01-19 DIAGNOSIS — N2581 Secondary hyperparathyroidism of renal origin: Secondary | ICD-10-CM | POA: Diagnosis not present

## 2015-01-19 DIAGNOSIS — Z992 Dependence on renal dialysis: Secondary | ICD-10-CM | POA: Diagnosis not present

## 2015-01-19 DIAGNOSIS — D631 Anemia in chronic kidney disease: Secondary | ICD-10-CM | POA: Diagnosis not present

## 2015-01-19 DIAGNOSIS — N186 End stage renal disease: Secondary | ICD-10-CM | POA: Diagnosis not present

## 2015-01-21 ENCOUNTER — Other Ambulatory Visit: Payer: Self-pay | Admitting: Cardiology

## 2015-01-21 DIAGNOSIS — Z452 Encounter for adjustment and management of vascular access device: Secondary | ICD-10-CM | POA: Diagnosis not present

## 2015-01-22 DIAGNOSIS — Z992 Dependence on renal dialysis: Secondary | ICD-10-CM | POA: Diagnosis not present

## 2015-01-22 DIAGNOSIS — D631 Anemia in chronic kidney disease: Secondary | ICD-10-CM | POA: Diagnosis not present

## 2015-01-22 DIAGNOSIS — N2581 Secondary hyperparathyroidism of renal origin: Secondary | ICD-10-CM | POA: Diagnosis not present

## 2015-01-22 DIAGNOSIS — D509 Iron deficiency anemia, unspecified: Secondary | ICD-10-CM | POA: Diagnosis not present

## 2015-01-22 DIAGNOSIS — N186 End stage renal disease: Secondary | ICD-10-CM | POA: Diagnosis not present

## 2015-01-23 ENCOUNTER — Emergency Department (HOSPITAL_COMMUNITY)
Admission: EM | Admit: 2015-01-23 | Discharge: 2015-01-23 | Disposition: A | Discharge status: Home / Self Care | Payer: Medicare Other | Attending: Emergency Medicine | Admitting: Emergency Medicine

## 2015-01-23 ENCOUNTER — Encounter (HOSPITAL_COMMUNITY): Payer: Self-pay | Admitting: *Deleted

## 2015-01-23 DIAGNOSIS — Z8673 Personal history of transient ischemic attack (TIA), and cerebral infarction without residual deficits: Secondary | ICD-10-CM | POA: Diagnosis not present

## 2015-01-23 DIAGNOSIS — I252 Old myocardial infarction: Secondary | ICD-10-CM | POA: Insufficient documentation

## 2015-01-23 DIAGNOSIS — Z8739 Personal history of other diseases of the musculoskeletal system and connective tissue: Secondary | ICD-10-CM | POA: Insufficient documentation

## 2015-01-23 DIAGNOSIS — Z9889 Other specified postprocedural states: Secondary | ICD-10-CM | POA: Insufficient documentation

## 2015-01-23 DIAGNOSIS — I2510 Atherosclerotic heart disease of native coronary artery without angina pectoris: Secondary | ICD-10-CM | POA: Diagnosis not present

## 2015-01-23 DIAGNOSIS — E119 Type 2 diabetes mellitus without complications: Secondary | ICD-10-CM | POA: Diagnosis not present

## 2015-01-23 DIAGNOSIS — Z7982 Long term (current) use of aspirin: Secondary | ICD-10-CM | POA: Diagnosis not present

## 2015-01-23 DIAGNOSIS — N183 Chronic kidney disease, stage 3 (moderate): Secondary | ICD-10-CM | POA: Insufficient documentation

## 2015-01-23 DIAGNOSIS — I129 Hypertensive chronic kidney disease with stage 1 through stage 4 chronic kidney disease, or unspecified chronic kidney disease: Secondary | ICD-10-CM | POA: Diagnosis not present

## 2015-01-23 DIAGNOSIS — N4889 Other specified disorders of penis: Secondary | ICD-10-CM | POA: Diagnosis not present

## 2015-01-23 DIAGNOSIS — N501 Vascular disorders of male genital organs: Secondary | ICD-10-CM | POA: Diagnosis not present

## 2015-01-23 DIAGNOSIS — R319 Hematuria, unspecified: Secondary | ICD-10-CM | POA: Diagnosis present

## 2015-01-23 DIAGNOSIS — N99538 Other complication of other stoma of urinary tract: Secondary | ICD-10-CM | POA: Diagnosis not present

## 2015-01-23 DIAGNOSIS — Z7902 Long term (current) use of antithrombotics/antiplatelets: Secondary | ICD-10-CM | POA: Diagnosis not present

## 2015-01-23 DIAGNOSIS — Z8551 Personal history of malignant neoplasm of bladder: Secondary | ICD-10-CM | POA: Insufficient documentation

## 2015-01-23 DIAGNOSIS — Z743 Need for continuous supervision: Secondary | ICD-10-CM | POA: Diagnosis not present

## 2015-01-23 DIAGNOSIS — Z87891 Personal history of nicotine dependence: Secondary | ICD-10-CM | POA: Diagnosis not present

## 2015-01-23 DIAGNOSIS — R279 Unspecified lack of coordination: Secondary | ICD-10-CM | POA: Diagnosis not present

## 2015-01-23 DIAGNOSIS — Z9861 Coronary angioplasty status: Secondary | ICD-10-CM | POA: Diagnosis not present

## 2015-01-23 DIAGNOSIS — E785 Hyperlipidemia, unspecified: Secondary | ICD-10-CM | POA: Diagnosis not present

## 2015-01-23 DIAGNOSIS — Z79899 Other long term (current) drug therapy: Secondary | ICD-10-CM | POA: Diagnosis not present

## 2015-01-23 DIAGNOSIS — Z992 Dependence on renal dialysis: Secondary | ICD-10-CM | POA: Diagnosis not present

## 2015-01-23 DIAGNOSIS — N186 End stage renal disease: Secondary | ICD-10-CM | POA: Diagnosis not present

## 2015-01-23 LAB — BASIC METABOLIC PANEL
Anion gap: 12 (ref 5–15)
BUN: 29 mg/dL — AB (ref 6–20)
CO2: 26 mmol/L (ref 22–32)
Calcium: 8 mg/dL — ABNORMAL LOW (ref 8.9–10.3)
Chloride: 99 mmol/L — ABNORMAL LOW (ref 101–111)
Creatinine, Ser: 5.79 mg/dL — ABNORMAL HIGH (ref 0.61–1.24)
GFR calc Af Amer: 10 mL/min — ABNORMAL LOW (ref >60)
GFR calc non Af Amer: 8 mL/min — ABNORMAL LOW (ref >60)
Glucose, Bld: 212 mg/dL — ABNORMAL HIGH (ref 65–99)
POTASSIUM: 3.6 mmol/L (ref 3.5–5.1)
SODIUM: 137 mmol/L (ref 135–145)

## 2015-01-23 LAB — CBC WITH DIFFERENTIAL/PLATELET
Basophils Absolute: 0 10*3/uL (ref 0.0–0.1)
Basophils Relative: 0 % (ref 0–1)
Eosinophils Absolute: 0.2 10*3/uL (ref 0.0–0.7)
Eosinophils Relative: 3 % (ref 0–5)
HCT: 26.3 % — ABNORMAL LOW (ref 39.0–52.0)
Hemoglobin: 8.6 g/dL — ABNORMAL LOW (ref 13.0–17.0)
LYMPHS PCT: 16 % (ref 12–46)
Lymphs Abs: 0.9 10*3/uL (ref 0.7–4.0)
MCH: 33.3 pg (ref 26.0–34.0)
MCHC: 32.7 g/dL (ref 30.0–36.0)
MCV: 101.9 fL — AB (ref 78.0–100.0)
Monocytes Absolute: 0.4 10*3/uL (ref 0.1–1.0)
Monocytes Relative: 8 % (ref 3–12)
NEUTROS PCT: 73 % (ref 43–77)
Neutro Abs: 4.2 10*3/uL (ref 1.7–7.7)
Platelets: 264 10*3/uL (ref 150–400)
RBC: 2.58 MIL/uL — AB (ref 4.22–5.81)
RDW: 15.9 % — ABNORMAL HIGH (ref 11.5–15.5)
WBC: 5.8 10*3/uL (ref 4.0–10.5)

## 2015-01-23 NOTE — Discharge Instructions (Signed)
Call your urologist at [facility] tomorrow morning and let them know that you are having bleeding from your penis

## 2015-01-23 NOTE — ED Notes (Addendum)
From home, hx renal and bladder ca, has bilateral nephrostomy tubes. Dialysis T Th Sat. Earlier today noticed blood from penis and in nephrostomy tubes. Never has had anything like this. No pain, no trauma.  BP 148/81 HR 97

## 2015-01-24 DIAGNOSIS — N186 End stage renal disease: Secondary | ICD-10-CM | POA: Diagnosis not present

## 2015-01-24 DIAGNOSIS — N2581 Secondary hyperparathyroidism of renal origin: Secondary | ICD-10-CM | POA: Diagnosis not present

## 2015-01-24 DIAGNOSIS — Z23 Encounter for immunization: Secondary | ICD-10-CM | POA: Diagnosis not present

## 2015-01-24 DIAGNOSIS — D509 Iron deficiency anemia, unspecified: Secondary | ICD-10-CM | POA: Diagnosis not present

## 2015-01-24 DIAGNOSIS — D631 Anemia in chronic kidney disease: Secondary | ICD-10-CM | POA: Diagnosis not present

## 2015-01-24 DIAGNOSIS — Z992 Dependence on renal dialysis: Secondary | ICD-10-CM | POA: Diagnosis not present

## 2015-01-25 NOTE — ED Provider Notes (Signed)
CSN: [id number]     Arrival date & time 01/23/15  1829 History   First MD Initiated Contact with Patient 01/23/15 [place]     Chief Complaint  Patient presents with  . Hematuria     (Consider location/radiation/quality/duration/timing/severity/associated sxs/prior Treatment) Patient is a 81 y.o. male presenting with hematuria. The history is provided by the patient (the pt states he is having some blood from his penis,  he has 2 percutaneous nephrostomy tubes from previous surgry and does not urinate from his penis).  Hematuria This is a new problem. The current episode started 1 to 2 hours ago. The problem occurs rarely. The problem has not changed since onset.Pertinent negatives include no chest pain, no abdominal pain and no headaches. Nothing aggravates the symptoms. Nothing relieves the symptoms. He has tried nothing for the symptoms.    Past Medical History  Diagnosis Date  . Dyslipidemia   . DJD (degenerative joint disease)   . Hypertension   . Diabetes mellitus   . BPH (benign prostatic hyperplasia)   . Coronary artery disease     a. 1997 s/p MI and prior RCA stenting;  b. 04/2013 MV: inflat ischemia;  c. 05/2013 Cath/PCI: LM 10-20, LAD 30-40p, LCX 95p (3.0x15 Xience DES), 71m, OM1/2 30, RCA 50p diff ISR, 90d (med Rx).  . Bladder cancer   . Stroke   . Myocardial infarction   . CKD (chronic kidney disease) stage 3, GFR 30-59 ml/min     dialysis   Past Surgical History  Procedure Laterality Date  . Cholecystectomy    . Esophagogastroduodenoscopy  06/08/2002    Soft stricture of the GE  junction with erosive esophagitis. The stricture was dilated to 58 [place] using a Maloney dilator. Swollen fold at GE junction on the gastric site which was biopsied for histology and was suspicious for sentinel folds.  . Colonoscopy  06/08/2002    Small polyp was oblated via cold biopsy from the cecum and two were snared, one was at the rectosigmoid junction measuring about a centimeter, another  one at the rectum which was smaller.  . Transurethral resection of bladder tumor      x7  . Coronary angioplasty  1997    3 stents  . Transurethral resection of bladder tumor  03/15/2012    Procedure: TRANSURETHRAL RESECTION OF BLADDER TUMOR (TURBT);  Surgeon: Marissa Nestle, MD;  Location: [facility];  Service: Urology;  Laterality: N/A;  . Cataract extraction w/phaco  06/20/2012    Procedure: CATARACT EXTRACTION PHACO AND INTRAOCULAR LENS PLACEMENT (IOC);  Surgeon: Tonny Branch, MD;  Location: [facility];  Service: Ophthalmology;  Laterality: Right;  CDE=16.59  . Cataract extraction w/phaco Left 07/04/2012    Procedure: CATARACT EXTRACTION PHACO AND INTRAOCULAR LENS PLACEMENT (IOC);  Surgeon: Tonny Branch, MD;  Location: [facility];  Service: Ophthalmology;  Laterality: Left;  CDE: 24.33  . Transurethral resection of bladder tumor N/A 09/29/2012    Procedure: TRANSURETHRAL RESECTION OF BLADDER TUMOR (TURBT);  Surgeon: Marissa Nestle, MD;  Location: [facility];  Service: Urology;  Laterality: N/A;  . Multiple tooth extractions    . Cystoscopy with biopsy N/A 04/27/2013    Procedure: CYSTOSCOPY WITH BLADDER BIOPSY & FULGURATION;  Surgeon: Marissa Nestle, MD;  Location: [facility];  Service: Urology;  Laterality: N/A;  . Left heart catheterization with coronary angiogram N/A 06/09/2013    Procedure: LEFT HEART CATHETERIZATION WITH CORONARY ANGIOGRAM;  Surgeon: Peter M Martinique, MD;  Location: [facility] CATH LAB;  Service: Cardiovascular;  Laterality: N/A;  . Percutaneous coronary rotoblator intervention (pci-r) N/A 06/12/2013    Procedure: PERCUTANEOUS CORONARY ROTOBLATOR INTERVENTION (PCI-R);  Surgeon: Blane Ohara, MD;  Location: [facility] CATH LAB;  Service: Cardiovascular;  Laterality: N/A;  . Tonsillectomy    . Av fistula placement Right 09/05/2014    Procedure: BRACHIOCEPHALIC ARTERIOVENOUS (AV) FISTULA CREATION RIGHT ARM;  Surgeon: Mal Misty, MD;  Location: [facility] OR;  Service: Vascular;  Laterality: Right;   Family  History  Problem Relation Age of Onset  . Stroke Mother 53  . Diabetes Brother 69  . Heart disease Father   . Heart attack Father    Social History  Substance Use Topics  . Smoking status: Former Smoker -- 0.50 packs/day for 20 years    Types: Cigarettes    Quit date: 03/08/1981  . Smokeless tobacco: Never Used  . Alcohol Use: No    Review of Systems  Constitutional: Negative for appetite change and fatigue.  HENT: Negative for congestion, ear discharge and sinus pressure.   Eyes: Negative for discharge.  Respiratory: Negative for cough.   Cardiovascular: Negative for chest pain.  Gastrointestinal: Negative for abdominal pain and diarrhea.  Genitourinary: Positive for hematuria. Negative for frequency.  Musculoskeletal: Negative for back pain.  Skin: Negative for rash.  Neurological: Negative for seizures and headaches.  Psychiatric/Behavioral: Negative for hallucinations.      Allergies  Aspirin; Celecoxib; Hydrocodone-acetaminophen; Morphine; Niacin; Other; Piroxicam; and Ciprofloxacin  Home Medications   Prior to Admission medications   Medication Sig Start Date End Date Taking? Authorizing Provider  amLODipine (NORVASC) 5 MG tablet Take 5 mg by mouth daily.   Yes Historical Provider, MD  aspirin EC 81 MG tablet Take 81 mg by mouth daily.   Yes Historical Provider, MD  atorvastatin (LIPITOR) 20 MG tablet Take 20 mg by mouth daily.   Yes Historical Provider, MD  clopidogrel (PLAVIX) 75 MG tablet Take 75 mg by mouth daily.   Yes Historical Provider, MD  ezetimibe (ZETIA) 10 MG tablet Take 10 mg by mouth every morning.    Yes Historical Provider, MD  furosemide (LASIX) 40 MG tablet Take 40 mg by mouth 2 (two) times daily.  08/25/14  Yes Historical Provider, MD  gemfibrozil (LOPID) 600 MG tablet Take 600 mg by mouth 2 (two) times daily before a meal.   Yes Historical Provider, MD  hydrochlorothiazide (HYDRODIURIL) 25 MG tablet Take 25 mg by mouth at bedtime.  08/20/14  Yes  Historical Provider, MD  isosorbide mononitrate (IMDUR) 60 MG 24 hr tablet Take 1 tablet (60 mg total) by mouth daily. 08/20/14  Yes Arnoldo Lenis, MD  nitroGLYCERIN (NITROSTAT) 0.4 MG SL tablet Place 1 tablet (0.4 mg total) under the tongue every 5 (five) minutes x 3 doses as needed for chest pain. Chest Pain 02/19/14  Yes Arnoldo Lenis, MD  omeprazole (PRILOSEC) 20 MG capsule Take 20 mg by mouth at bedtime.    Yes Historical Provider, MD   BP 135/69 mmHg  Pulse 92  Temp(Src) 98.3 F (36.8 C) (Oral)  Resp 18  Ht 5\' 8"  (1.727 m)  Wt 210 lb (95.255 kg)  BMI 31.94 kg/m2  SpO2 98% Physical Exam  Constitutional: He is oriented to person, place, and time. He appears well-developed.  HENT:  Head: Normocephalic.  Eyes: Conjunctivae and EOM are normal. No scleral icterus.  Neck: Neck supple. No thyromegaly present.  Cardiovascular: Normal rate and regular rhythm.  Exam reveals no gallop and no friction rub.  No murmur heard. Pulmonary/Chest: No stridor. He has no wheezes. He has no rales. He exhibits no tenderness.  Abdominal: He exhibits no distension. There is no tenderness. There is no rebound.  Genitourinary:  Two percutaneous nephrostomy tubes draining with some blood.  Minor blood at meatus of penis  Musculoskeletal: Normal range of motion. He exhibits no edema.  Lymphadenopathy:    He has no cervical adenopathy.  Neurological: He is oriented to person, place, and time. He exhibits normal muscle tone. Coordination normal.  Skin: No rash noted. No erythema.  Psychiatric: He has a normal mood and affect. His behavior is normal.    ED Course  Procedures (including critical care time) Labs Review Labs Reviewed  CBC WITH DIFFERENTIAL/PLATELET - Abnormal; Notable for the following:    RBC 2.58 (*)    Hemoglobin 8.6 (*)    HCT 26.3 (*)    MCV 101.9 (*)    RDW 15.9 (*)    All other components within normal limits  BASIC METABOLIC PANEL - Abnormal; Notable for the following:     Chloride 99 (*)    Glucose, Bld 212 (*)    BUN 29 (*)    Creatinine, Ser 5.79 (*)    Calcium 8.0 (*)    GFR calc non Af Amer 8 (*)    GFR calc Af Amer 10 (*)    All other components within normal limits    Imaging Review No results found. I have personally reviewed and evaluated these images and lab results as part of my medical decision-making.   EKG Interpretation None      MDM   Final diagnoses:  Penile bleeding    Pt with penile bleeding,  Vitals stable,  Hg 1 gram lower from last month.  Pt will contact his urologist at [facility] in the am and follow up accordingly.    Milton Ferguson, MD 01/25/15 1105

## 2015-01-26 DIAGNOSIS — D509 Iron deficiency anemia, unspecified: Secondary | ICD-10-CM | POA: Diagnosis not present

## 2015-01-26 DIAGNOSIS — Z23 Encounter for immunization: Secondary | ICD-10-CM | POA: Diagnosis not present

## 2015-01-26 DIAGNOSIS — D631 Anemia in chronic kidney disease: Secondary | ICD-10-CM | POA: Diagnosis not present

## 2015-01-26 DIAGNOSIS — Z992 Dependence on renal dialysis: Secondary | ICD-10-CM | POA: Diagnosis not present

## 2015-01-26 DIAGNOSIS — N2581 Secondary hyperparathyroidism of renal origin: Secondary | ICD-10-CM | POA: Diagnosis not present

## 2015-01-26 DIAGNOSIS — N186 End stage renal disease: Secondary | ICD-10-CM | POA: Diagnosis not present

## 2015-01-29 DIAGNOSIS — D631 Anemia in chronic kidney disease: Secondary | ICD-10-CM | POA: Diagnosis not present

## 2015-01-29 DIAGNOSIS — N2581 Secondary hyperparathyroidism of renal origin: Secondary | ICD-10-CM | POA: Diagnosis not present

## 2015-01-29 DIAGNOSIS — N186 End stage renal disease: Secondary | ICD-10-CM | POA: Diagnosis not present

## 2015-01-29 DIAGNOSIS — D509 Iron deficiency anemia, unspecified: Secondary | ICD-10-CM | POA: Diagnosis not present

## 2015-01-29 DIAGNOSIS — Z23 Encounter for immunization: Secondary | ICD-10-CM | POA: Diagnosis not present

## 2015-01-29 DIAGNOSIS — Z992 Dependence on renal dialysis: Secondary | ICD-10-CM | POA: Diagnosis not present

## 2015-01-31 DIAGNOSIS — Z23 Encounter for immunization: Secondary | ICD-10-CM | POA: Diagnosis not present

## 2015-01-31 DIAGNOSIS — N2581 Secondary hyperparathyroidism of renal origin: Secondary | ICD-10-CM | POA: Diagnosis not present

## 2015-01-31 DIAGNOSIS — N186 End stage renal disease: Secondary | ICD-10-CM | POA: Diagnosis not present

## 2015-01-31 DIAGNOSIS — D631 Anemia in chronic kidney disease: Secondary | ICD-10-CM | POA: Diagnosis not present

## 2015-01-31 DIAGNOSIS — Z992 Dependence on renal dialysis: Secondary | ICD-10-CM | POA: Diagnosis not present

## 2015-01-31 DIAGNOSIS — D509 Iron deficiency anemia, unspecified: Secondary | ICD-10-CM | POA: Diagnosis not present

## 2015-02-02 DIAGNOSIS — N186 End stage renal disease: Secondary | ICD-10-CM | POA: Diagnosis not present

## 2015-02-02 DIAGNOSIS — Z992 Dependence on renal dialysis: Secondary | ICD-10-CM | POA: Diagnosis not present

## 2015-02-02 DIAGNOSIS — N2581 Secondary hyperparathyroidism of renal origin: Secondary | ICD-10-CM | POA: Diagnosis not present

## 2015-02-02 DIAGNOSIS — D509 Iron deficiency anemia, unspecified: Secondary | ICD-10-CM | POA: Diagnosis not present

## 2015-02-02 DIAGNOSIS — Z23 Encounter for immunization: Secondary | ICD-10-CM | POA: Diagnosis not present

## 2015-02-02 DIAGNOSIS — D631 Anemia in chronic kidney disease: Secondary | ICD-10-CM | POA: Diagnosis not present

## 2015-02-05 DIAGNOSIS — D631 Anemia in chronic kidney disease: Secondary | ICD-10-CM | POA: Diagnosis not present

## 2015-02-05 DIAGNOSIS — D509 Iron deficiency anemia, unspecified: Secondary | ICD-10-CM | POA: Diagnosis not present

## 2015-02-05 DIAGNOSIS — Z992 Dependence on renal dialysis: Secondary | ICD-10-CM | POA: Diagnosis not present

## 2015-02-05 DIAGNOSIS — N2581 Secondary hyperparathyroidism of renal origin: Secondary | ICD-10-CM | POA: Diagnosis not present

## 2015-02-05 DIAGNOSIS — N186 End stage renal disease: Secondary | ICD-10-CM | POA: Diagnosis not present

## 2015-02-05 DIAGNOSIS — Z23 Encounter for immunization: Secondary | ICD-10-CM | POA: Diagnosis not present

## 2015-02-07 DIAGNOSIS — Z23 Encounter for immunization: Secondary | ICD-10-CM | POA: Diagnosis not present

## 2015-02-07 DIAGNOSIS — N186 End stage renal disease: Secondary | ICD-10-CM | POA: Diagnosis not present

## 2015-02-07 DIAGNOSIS — D631 Anemia in chronic kidney disease: Secondary | ICD-10-CM | POA: Diagnosis not present

## 2015-02-07 DIAGNOSIS — N2581 Secondary hyperparathyroidism of renal origin: Secondary | ICD-10-CM | POA: Diagnosis not present

## 2015-02-07 DIAGNOSIS — Z992 Dependence on renal dialysis: Secondary | ICD-10-CM | POA: Diagnosis not present

## 2015-02-07 DIAGNOSIS — D509 Iron deficiency anemia, unspecified: Secondary | ICD-10-CM | POA: Diagnosis not present

## 2015-02-09 DIAGNOSIS — Z992 Dependence on renal dialysis: Secondary | ICD-10-CM | POA: Diagnosis not present

## 2015-02-09 DIAGNOSIS — N2581 Secondary hyperparathyroidism of renal origin: Secondary | ICD-10-CM | POA: Diagnosis not present

## 2015-02-09 DIAGNOSIS — Z23 Encounter for immunization: Secondary | ICD-10-CM | POA: Diagnosis not present

## 2015-02-09 DIAGNOSIS — N186 End stage renal disease: Secondary | ICD-10-CM | POA: Diagnosis not present

## 2015-02-09 DIAGNOSIS — D631 Anemia in chronic kidney disease: Secondary | ICD-10-CM | POA: Diagnosis not present

## 2015-02-09 DIAGNOSIS — D509 Iron deficiency anemia, unspecified: Secondary | ICD-10-CM | POA: Diagnosis not present

## 2015-02-12 DIAGNOSIS — Z23 Encounter for immunization: Secondary | ICD-10-CM | POA: Diagnosis not present

## 2015-02-12 DIAGNOSIS — N186 End stage renal disease: Secondary | ICD-10-CM | POA: Diagnosis not present

## 2015-02-12 DIAGNOSIS — Z992 Dependence on renal dialysis: Secondary | ICD-10-CM | POA: Diagnosis not present

## 2015-02-12 DIAGNOSIS — D631 Anemia in chronic kidney disease: Secondary | ICD-10-CM | POA: Diagnosis not present

## 2015-02-12 DIAGNOSIS — N2581 Secondary hyperparathyroidism of renal origin: Secondary | ICD-10-CM | POA: Diagnosis not present

## 2015-02-12 DIAGNOSIS — D509 Iron deficiency anemia, unspecified: Secondary | ICD-10-CM | POA: Diagnosis not present

## 2015-02-13 DIAGNOSIS — N184 Chronic kidney disease, stage 4 (severe): Secondary | ICD-10-CM | POA: Diagnosis not present

## 2015-02-13 DIAGNOSIS — Z4803 Encounter for change or removal of drains: Secondary | ICD-10-CM | POA: Diagnosis not present

## 2015-02-13 DIAGNOSIS — Z436 Encounter for attention to other artificial openings of urinary tract: Secondary | ICD-10-CM | POA: Diagnosis not present

## 2015-02-16 DIAGNOSIS — N186 End stage renal disease: Secondary | ICD-10-CM | POA: Diagnosis not present

## 2015-02-16 DIAGNOSIS — Z992 Dependence on renal dialysis: Secondary | ICD-10-CM | POA: Diagnosis not present

## 2015-02-16 DIAGNOSIS — N2581 Secondary hyperparathyroidism of renal origin: Secondary | ICD-10-CM | POA: Diagnosis not present

## 2015-02-16 DIAGNOSIS — D631 Anemia in chronic kidney disease: Secondary | ICD-10-CM | POA: Diagnosis not present

## 2015-02-16 DIAGNOSIS — D509 Iron deficiency anemia, unspecified: Secondary | ICD-10-CM | POA: Diagnosis not present

## 2015-02-16 DIAGNOSIS — Z23 Encounter for immunization: Secondary | ICD-10-CM | POA: Diagnosis not present

## 2015-02-19 DIAGNOSIS — D509 Iron deficiency anemia, unspecified: Secondary | ICD-10-CM | POA: Diagnosis not present

## 2015-02-19 DIAGNOSIS — Z23 Encounter for immunization: Secondary | ICD-10-CM | POA: Diagnosis not present

## 2015-02-19 DIAGNOSIS — N186 End stage renal disease: Secondary | ICD-10-CM | POA: Diagnosis not present

## 2015-02-19 DIAGNOSIS — Z992 Dependence on renal dialysis: Secondary | ICD-10-CM | POA: Diagnosis not present

## 2015-02-19 DIAGNOSIS — D631 Anemia in chronic kidney disease: Secondary | ICD-10-CM | POA: Diagnosis not present

## 2015-02-19 DIAGNOSIS — N2581 Secondary hyperparathyroidism of renal origin: Secondary | ICD-10-CM | POA: Diagnosis not present

## 2015-02-20 ENCOUNTER — Other Ambulatory Visit: Payer: Self-pay | Admitting: Cardiology

## 2015-02-21 ENCOUNTER — Other Ambulatory Visit: Payer: Self-pay | Admitting: Cardiology

## 2015-02-21 DIAGNOSIS — R32 Unspecified urinary incontinence: Secondary | ICD-10-CM | POA: Diagnosis not present

## 2015-02-21 DIAGNOSIS — C678 Malignant neoplasm of overlapping sites of bladder: Secondary | ICD-10-CM | POA: Diagnosis not present

## 2015-02-21 DIAGNOSIS — D631 Anemia in chronic kidney disease: Secondary | ICD-10-CM | POA: Diagnosis not present

## 2015-02-21 DIAGNOSIS — N2581 Secondary hyperparathyroidism of renal origin: Secondary | ICD-10-CM | POA: Diagnosis not present

## 2015-02-21 DIAGNOSIS — Z23 Encounter for immunization: Secondary | ICD-10-CM | POA: Diagnosis not present

## 2015-02-21 DIAGNOSIS — N186 End stage renal disease: Secondary | ICD-10-CM | POA: Diagnosis not present

## 2015-02-21 DIAGNOSIS — Z992 Dependence on renal dialysis: Secondary | ICD-10-CM | POA: Diagnosis not present

## 2015-02-21 DIAGNOSIS — D509 Iron deficiency anemia, unspecified: Secondary | ICD-10-CM | POA: Diagnosis not present

## 2015-02-22 DIAGNOSIS — N186 End stage renal disease: Secondary | ICD-10-CM | POA: Diagnosis not present

## 2015-02-26 DIAGNOSIS — N186 End stage renal disease: Secondary | ICD-10-CM | POA: Diagnosis not present

## 2015-02-26 DIAGNOSIS — D631 Anemia in chronic kidney disease: Secondary | ICD-10-CM | POA: Diagnosis not present

## 2015-02-26 DIAGNOSIS — D509 Iron deficiency anemia, unspecified: Secondary | ICD-10-CM | POA: Diagnosis not present

## 2015-02-26 DIAGNOSIS — Z992 Dependence on renal dialysis: Secondary | ICD-10-CM | POA: Diagnosis not present

## 2015-02-26 DIAGNOSIS — N2581 Secondary hyperparathyroidism of renal origin: Secondary | ICD-10-CM | POA: Diagnosis not present

## 2015-02-28 DIAGNOSIS — D631 Anemia in chronic kidney disease: Secondary | ICD-10-CM | POA: Diagnosis not present

## 2015-02-28 DIAGNOSIS — D509 Iron deficiency anemia, unspecified: Secondary | ICD-10-CM | POA: Diagnosis not present

## 2015-02-28 DIAGNOSIS — N2581 Secondary hyperparathyroidism of renal origin: Secondary | ICD-10-CM | POA: Diagnosis not present

## 2015-02-28 DIAGNOSIS — N186 End stage renal disease: Secondary | ICD-10-CM | POA: Diagnosis not present

## 2015-02-28 DIAGNOSIS — Z992 Dependence on renal dialysis: Secondary | ICD-10-CM | POA: Diagnosis not present

## 2015-03-02 DIAGNOSIS — D509 Iron deficiency anemia, unspecified: Secondary | ICD-10-CM | POA: Diagnosis not present

## 2015-03-02 DIAGNOSIS — N2581 Secondary hyperparathyroidism of renal origin: Secondary | ICD-10-CM | POA: Diagnosis not present

## 2015-03-02 DIAGNOSIS — Z992 Dependence on renal dialysis: Secondary | ICD-10-CM | POA: Diagnosis not present

## 2015-03-02 DIAGNOSIS — D631 Anemia in chronic kidney disease: Secondary | ICD-10-CM | POA: Diagnosis not present

## 2015-03-02 DIAGNOSIS — N186 End stage renal disease: Secondary | ICD-10-CM | POA: Diagnosis not present

## 2015-03-05 DIAGNOSIS — N186 End stage renal disease: Secondary | ICD-10-CM | POA: Diagnosis not present

## 2015-03-05 DIAGNOSIS — N2581 Secondary hyperparathyroidism of renal origin: Secondary | ICD-10-CM | POA: Diagnosis not present

## 2015-03-05 DIAGNOSIS — D631 Anemia in chronic kidney disease: Secondary | ICD-10-CM | POA: Diagnosis not present

## 2015-03-05 DIAGNOSIS — D509 Iron deficiency anemia, unspecified: Secondary | ICD-10-CM | POA: Diagnosis not present

## 2015-03-05 DIAGNOSIS — Z992 Dependence on renal dialysis: Secondary | ICD-10-CM | POA: Diagnosis not present

## 2015-03-05 DIAGNOSIS — E119 Type 2 diabetes mellitus without complications: Secondary | ICD-10-CM | POA: Diagnosis not present

## 2015-03-07 DIAGNOSIS — D509 Iron deficiency anemia, unspecified: Secondary | ICD-10-CM | POA: Diagnosis not present

## 2015-03-07 DIAGNOSIS — N186 End stage renal disease: Secondary | ICD-10-CM | POA: Diagnosis not present

## 2015-03-07 DIAGNOSIS — D631 Anemia in chronic kidney disease: Secondary | ICD-10-CM | POA: Diagnosis not present

## 2015-03-07 DIAGNOSIS — N2581 Secondary hyperparathyroidism of renal origin: Secondary | ICD-10-CM | POA: Diagnosis not present

## 2015-03-07 DIAGNOSIS — Z992 Dependence on renal dialysis: Secondary | ICD-10-CM | POA: Diagnosis not present

## 2015-03-09 DIAGNOSIS — D631 Anemia in chronic kidney disease: Secondary | ICD-10-CM | POA: Diagnosis not present

## 2015-03-09 DIAGNOSIS — N186 End stage renal disease: Secondary | ICD-10-CM | POA: Diagnosis not present

## 2015-03-09 DIAGNOSIS — N2581 Secondary hyperparathyroidism of renal origin: Secondary | ICD-10-CM | POA: Diagnosis not present

## 2015-03-09 DIAGNOSIS — D509 Iron deficiency anemia, unspecified: Secondary | ICD-10-CM | POA: Diagnosis not present

## 2015-03-09 DIAGNOSIS — Z992 Dependence on renal dialysis: Secondary | ICD-10-CM | POA: Diagnosis not present

## 2015-03-12 DIAGNOSIS — N2581 Secondary hyperparathyroidism of renal origin: Secondary | ICD-10-CM | POA: Diagnosis not present

## 2015-03-12 DIAGNOSIS — D631 Anemia in chronic kidney disease: Secondary | ICD-10-CM | POA: Diagnosis not present

## 2015-03-12 DIAGNOSIS — N186 End stage renal disease: Secondary | ICD-10-CM | POA: Diagnosis not present

## 2015-03-12 DIAGNOSIS — D509 Iron deficiency anemia, unspecified: Secondary | ICD-10-CM | POA: Diagnosis not present

## 2015-03-12 DIAGNOSIS — Z992 Dependence on renal dialysis: Secondary | ICD-10-CM | POA: Diagnosis not present

## 2015-03-13 DIAGNOSIS — Z436 Encounter for attention to other artificial openings of urinary tract: Secondary | ICD-10-CM | POA: Diagnosis not present

## 2015-03-13 DIAGNOSIS — Z4803 Encounter for change or removal of drains: Secondary | ICD-10-CM | POA: Diagnosis not present

## 2015-03-13 DIAGNOSIS — Z936 Other artificial openings of urinary tract status: Secondary | ICD-10-CM | POA: Diagnosis not present

## 2015-03-13 DIAGNOSIS — Z466 Encounter for fitting and adjustment of urinary device: Secondary | ICD-10-CM | POA: Diagnosis not present

## 2015-03-14 DIAGNOSIS — N2581 Secondary hyperparathyroidism of renal origin: Secondary | ICD-10-CM | POA: Diagnosis not present

## 2015-03-14 DIAGNOSIS — D509 Iron deficiency anemia, unspecified: Secondary | ICD-10-CM | POA: Diagnosis not present

## 2015-03-14 DIAGNOSIS — D631 Anemia in chronic kidney disease: Secondary | ICD-10-CM | POA: Diagnosis not present

## 2015-03-14 DIAGNOSIS — Z992 Dependence on renal dialysis: Secondary | ICD-10-CM | POA: Diagnosis not present

## 2015-03-14 DIAGNOSIS — N186 End stage renal disease: Secondary | ICD-10-CM | POA: Diagnosis not present

## 2015-03-16 DIAGNOSIS — D509 Iron deficiency anemia, unspecified: Secondary | ICD-10-CM | POA: Diagnosis not present

## 2015-03-16 DIAGNOSIS — N186 End stage renal disease: Secondary | ICD-10-CM | POA: Diagnosis not present

## 2015-03-16 DIAGNOSIS — D631 Anemia in chronic kidney disease: Secondary | ICD-10-CM | POA: Diagnosis not present

## 2015-03-16 DIAGNOSIS — Z992 Dependence on renal dialysis: Secondary | ICD-10-CM | POA: Diagnosis not present

## 2015-03-16 DIAGNOSIS — N2581 Secondary hyperparathyroidism of renal origin: Secondary | ICD-10-CM | POA: Diagnosis not present

## 2015-03-19 DIAGNOSIS — N2581 Secondary hyperparathyroidism of renal origin: Secondary | ICD-10-CM | POA: Diagnosis not present

## 2015-03-19 DIAGNOSIS — N186 End stage renal disease: Secondary | ICD-10-CM | POA: Diagnosis not present

## 2015-03-19 DIAGNOSIS — D631 Anemia in chronic kidney disease: Secondary | ICD-10-CM | POA: Diagnosis not present

## 2015-03-19 DIAGNOSIS — D509 Iron deficiency anemia, unspecified: Secondary | ICD-10-CM | POA: Diagnosis not present

## 2015-03-19 DIAGNOSIS — Z992 Dependence on renal dialysis: Secondary | ICD-10-CM | POA: Diagnosis not present

## 2015-03-20 ENCOUNTER — Other Ambulatory Visit: Payer: Self-pay | Admitting: Cardiology

## 2015-03-20 NOTE — Telephone Encounter (Signed)
Medication not on patients snapshot. Need to clarify if the patient should still be taking this. Please advise. Thanks, MI

## 2015-03-21 DIAGNOSIS — D509 Iron deficiency anemia, unspecified: Secondary | ICD-10-CM | POA: Diagnosis not present

## 2015-03-21 DIAGNOSIS — Z992 Dependence on renal dialysis: Secondary | ICD-10-CM | POA: Diagnosis not present

## 2015-03-21 DIAGNOSIS — N186 End stage renal disease: Secondary | ICD-10-CM | POA: Diagnosis not present

## 2015-03-21 DIAGNOSIS — D631 Anemia in chronic kidney disease: Secondary | ICD-10-CM | POA: Diagnosis not present

## 2015-03-21 DIAGNOSIS — N2581 Secondary hyperparathyroidism of renal origin: Secondary | ICD-10-CM | POA: Diagnosis not present

## 2015-03-21 NOTE — Telephone Encounter (Signed)
Call placed to patient, spoke with wife Judson Roch).  Stated he takes care of his medications himself, but is currently at dialysis.  Will not be home today till after 4.  Informed wife that office will call back tomorrow.  She verbalized understanding.

## 2015-03-23 DIAGNOSIS — D631 Anemia in chronic kidney disease: Secondary | ICD-10-CM | POA: Diagnosis not present

## 2015-03-23 DIAGNOSIS — N186 End stage renal disease: Secondary | ICD-10-CM | POA: Diagnosis not present

## 2015-03-23 DIAGNOSIS — D509 Iron deficiency anemia, unspecified: Secondary | ICD-10-CM | POA: Diagnosis not present

## 2015-03-23 DIAGNOSIS — N2581 Secondary hyperparathyroidism of renal origin: Secondary | ICD-10-CM | POA: Diagnosis not present

## 2015-03-23 DIAGNOSIS — Z992 Dependence on renal dialysis: Secondary | ICD-10-CM | POA: Diagnosis not present

## 2015-03-25 DIAGNOSIS — N186 End stage renal disease: Secondary | ICD-10-CM | POA: Diagnosis not present

## 2015-03-25 DIAGNOSIS — Z992 Dependence on renal dialysis: Secondary | ICD-10-CM | POA: Diagnosis not present

## 2015-03-26 DIAGNOSIS — D631 Anemia in chronic kidney disease: Secondary | ICD-10-CM | POA: Diagnosis not present

## 2015-03-26 DIAGNOSIS — N2581 Secondary hyperparathyroidism of renal origin: Secondary | ICD-10-CM | POA: Diagnosis not present

## 2015-03-26 DIAGNOSIS — Z23 Encounter for immunization: Secondary | ICD-10-CM | POA: Diagnosis not present

## 2015-03-26 DIAGNOSIS — N186 End stage renal disease: Secondary | ICD-10-CM | POA: Diagnosis not present

## 2015-03-26 DIAGNOSIS — Z992 Dependence on renal dialysis: Secondary | ICD-10-CM | POA: Diagnosis not present

## 2015-03-26 DIAGNOSIS — D509 Iron deficiency anemia, unspecified: Secondary | ICD-10-CM | POA: Diagnosis not present

## 2015-03-26 NOTE — Telephone Encounter (Signed)
Call placed to patient again.  Discussed his medications & says he is no longer on Metoprolol.  Will deny requested refill.  Patient last seen 02/2014.  Informed patient that he is past due for follow up.  Scheduled OV for 05/01/2015 at 11:00.  Asked to bring all medication bottles at that time.  Patient verbalized understanding.

## 2015-03-28 DIAGNOSIS — D631 Anemia in chronic kidney disease: Secondary | ICD-10-CM | POA: Diagnosis not present

## 2015-03-28 DIAGNOSIS — N186 End stage renal disease: Secondary | ICD-10-CM | POA: Diagnosis not present

## 2015-03-28 DIAGNOSIS — N2581 Secondary hyperparathyroidism of renal origin: Secondary | ICD-10-CM | POA: Diagnosis not present

## 2015-03-28 DIAGNOSIS — D509 Iron deficiency anemia, unspecified: Secondary | ICD-10-CM | POA: Diagnosis not present

## 2015-03-28 DIAGNOSIS — Z992 Dependence on renal dialysis: Secondary | ICD-10-CM | POA: Diagnosis not present

## 2015-03-28 DIAGNOSIS — Z23 Encounter for immunization: Secondary | ICD-10-CM | POA: Diagnosis not present

## 2015-03-30 DIAGNOSIS — D631 Anemia in chronic kidney disease: Secondary | ICD-10-CM | POA: Diagnosis not present

## 2015-03-30 DIAGNOSIS — N186 End stage renal disease: Secondary | ICD-10-CM | POA: Diagnosis not present

## 2015-03-30 DIAGNOSIS — Z992 Dependence on renal dialysis: Secondary | ICD-10-CM | POA: Diagnosis not present

## 2015-03-30 DIAGNOSIS — D509 Iron deficiency anemia, unspecified: Secondary | ICD-10-CM | POA: Diagnosis not present

## 2015-03-30 DIAGNOSIS — N2581 Secondary hyperparathyroidism of renal origin: Secondary | ICD-10-CM | POA: Diagnosis not present

## 2015-03-30 DIAGNOSIS — Z23 Encounter for immunization: Secondary | ICD-10-CM | POA: Diagnosis not present

## 2015-04-01 ENCOUNTER — Ambulatory Visit: Payer: Medicare Other | Admitting: Cardiology

## 2015-04-02 DIAGNOSIS — Z992 Dependence on renal dialysis: Secondary | ICD-10-CM | POA: Diagnosis not present

## 2015-04-02 DIAGNOSIS — D509 Iron deficiency anemia, unspecified: Secondary | ICD-10-CM | POA: Diagnosis not present

## 2015-04-02 DIAGNOSIS — Z23 Encounter for immunization: Secondary | ICD-10-CM | POA: Diagnosis not present

## 2015-04-02 DIAGNOSIS — D631 Anemia in chronic kidney disease: Secondary | ICD-10-CM | POA: Diagnosis not present

## 2015-04-02 DIAGNOSIS — N2581 Secondary hyperparathyroidism of renal origin: Secondary | ICD-10-CM | POA: Diagnosis not present

## 2015-04-02 DIAGNOSIS — N186 End stage renal disease: Secondary | ICD-10-CM | POA: Diagnosis not present

## 2015-04-04 DIAGNOSIS — Z992 Dependence on renal dialysis: Secondary | ICD-10-CM | POA: Diagnosis not present

## 2015-04-04 DIAGNOSIS — N186 End stage renal disease: Secondary | ICD-10-CM | POA: Diagnosis not present

## 2015-04-04 DIAGNOSIS — Z23 Encounter for immunization: Secondary | ICD-10-CM | POA: Diagnosis not present

## 2015-04-04 DIAGNOSIS — N2581 Secondary hyperparathyroidism of renal origin: Secondary | ICD-10-CM | POA: Diagnosis not present

## 2015-04-04 DIAGNOSIS — D509 Iron deficiency anemia, unspecified: Secondary | ICD-10-CM | POA: Diagnosis not present

## 2015-04-04 DIAGNOSIS — D631 Anemia in chronic kidney disease: Secondary | ICD-10-CM | POA: Diagnosis not present

## 2015-04-06 DIAGNOSIS — D631 Anemia in chronic kidney disease: Secondary | ICD-10-CM | POA: Diagnosis not present

## 2015-04-06 DIAGNOSIS — N2581 Secondary hyperparathyroidism of renal origin: Secondary | ICD-10-CM | POA: Diagnosis not present

## 2015-04-06 DIAGNOSIS — D509 Iron deficiency anemia, unspecified: Secondary | ICD-10-CM | POA: Diagnosis not present

## 2015-04-06 DIAGNOSIS — N186 End stage renal disease: Secondary | ICD-10-CM | POA: Diagnosis not present

## 2015-04-06 DIAGNOSIS — Z992 Dependence on renal dialysis: Secondary | ICD-10-CM | POA: Diagnosis not present

## 2015-04-06 DIAGNOSIS — Z23 Encounter for immunization: Secondary | ICD-10-CM | POA: Diagnosis not present

## 2015-04-09 DIAGNOSIS — Z23 Encounter for immunization: Secondary | ICD-10-CM | POA: Diagnosis not present

## 2015-04-09 DIAGNOSIS — N2581 Secondary hyperparathyroidism of renal origin: Secondary | ICD-10-CM | POA: Diagnosis not present

## 2015-04-09 DIAGNOSIS — Z992 Dependence on renal dialysis: Secondary | ICD-10-CM | POA: Diagnosis not present

## 2015-04-09 DIAGNOSIS — N186 End stage renal disease: Secondary | ICD-10-CM | POA: Diagnosis not present

## 2015-04-09 DIAGNOSIS — D631 Anemia in chronic kidney disease: Secondary | ICD-10-CM | POA: Diagnosis not present

## 2015-04-09 DIAGNOSIS — D509 Iron deficiency anemia, unspecified: Secondary | ICD-10-CM | POA: Diagnosis not present

## 2015-04-10 DIAGNOSIS — N135 Crossing vessel and stricture of ureter without hydronephrosis: Secondary | ICD-10-CM | POA: Diagnosis not present

## 2015-04-10 DIAGNOSIS — Z436 Encounter for attention to other artificial openings of urinary tract: Secondary | ICD-10-CM | POA: Diagnosis not present

## 2015-04-10 DIAGNOSIS — Z4803 Encounter for change or removal of drains: Secondary | ICD-10-CM | POA: Diagnosis not present

## 2015-04-10 DIAGNOSIS — C679 Malignant neoplasm of bladder, unspecified: Secondary | ICD-10-CM | POA: Diagnosis not present

## 2015-04-10 DIAGNOSIS — Z936 Other artificial openings of urinary tract status: Secondary | ICD-10-CM | POA: Diagnosis not present

## 2015-04-11 DIAGNOSIS — D631 Anemia in chronic kidney disease: Secondary | ICD-10-CM | POA: Diagnosis not present

## 2015-04-11 DIAGNOSIS — D509 Iron deficiency anemia, unspecified: Secondary | ICD-10-CM | POA: Diagnosis not present

## 2015-04-11 DIAGNOSIS — Z992 Dependence on renal dialysis: Secondary | ICD-10-CM | POA: Diagnosis not present

## 2015-04-11 DIAGNOSIS — Z23 Encounter for immunization: Secondary | ICD-10-CM | POA: Diagnosis not present

## 2015-04-11 DIAGNOSIS — N2581 Secondary hyperparathyroidism of renal origin: Secondary | ICD-10-CM | POA: Diagnosis not present

## 2015-04-11 DIAGNOSIS — N186 End stage renal disease: Secondary | ICD-10-CM | POA: Diagnosis not present

## 2015-04-13 DIAGNOSIS — D631 Anemia in chronic kidney disease: Secondary | ICD-10-CM | POA: Diagnosis not present

## 2015-04-13 DIAGNOSIS — D509 Iron deficiency anemia, unspecified: Secondary | ICD-10-CM | POA: Diagnosis not present

## 2015-04-13 DIAGNOSIS — N186 End stage renal disease: Secondary | ICD-10-CM | POA: Diagnosis not present

## 2015-04-13 DIAGNOSIS — Z992 Dependence on renal dialysis: Secondary | ICD-10-CM | POA: Diagnosis not present

## 2015-04-13 DIAGNOSIS — N2581 Secondary hyperparathyroidism of renal origin: Secondary | ICD-10-CM | POA: Diagnosis not present

## 2015-04-13 DIAGNOSIS — Z23 Encounter for immunization: Secondary | ICD-10-CM | POA: Diagnosis not present

## 2015-04-16 DIAGNOSIS — N186 End stage renal disease: Secondary | ICD-10-CM | POA: Diagnosis not present

## 2015-04-16 DIAGNOSIS — N2581 Secondary hyperparathyroidism of renal origin: Secondary | ICD-10-CM | POA: Diagnosis not present

## 2015-04-16 DIAGNOSIS — D509 Iron deficiency anemia, unspecified: Secondary | ICD-10-CM | POA: Diagnosis not present

## 2015-04-16 DIAGNOSIS — D631 Anemia in chronic kidney disease: Secondary | ICD-10-CM | POA: Diagnosis not present

## 2015-04-16 DIAGNOSIS — Z992 Dependence on renal dialysis: Secondary | ICD-10-CM | POA: Diagnosis not present

## 2015-04-16 DIAGNOSIS — Z23 Encounter for immunization: Secondary | ICD-10-CM | POA: Diagnosis not present

## 2015-04-17 ENCOUNTER — Other Ambulatory Visit: Payer: Self-pay | Admitting: Cardiology

## 2015-04-20 DIAGNOSIS — D631 Anemia in chronic kidney disease: Secondary | ICD-10-CM | POA: Diagnosis not present

## 2015-04-20 DIAGNOSIS — N2581 Secondary hyperparathyroidism of renal origin: Secondary | ICD-10-CM | POA: Diagnosis not present

## 2015-04-20 DIAGNOSIS — N186 End stage renal disease: Secondary | ICD-10-CM | POA: Diagnosis not present

## 2015-04-20 DIAGNOSIS — D509 Iron deficiency anemia, unspecified: Secondary | ICD-10-CM | POA: Diagnosis not present

## 2015-04-20 DIAGNOSIS — Z23 Encounter for immunization: Secondary | ICD-10-CM | POA: Diagnosis not present

## 2015-04-20 DIAGNOSIS — Z992 Dependence on renal dialysis: Secondary | ICD-10-CM | POA: Diagnosis not present

## 2015-04-22 ENCOUNTER — Other Ambulatory Visit: Payer: Self-pay | Admitting: Cardiology

## 2015-04-23 DIAGNOSIS — Z992 Dependence on renal dialysis: Secondary | ICD-10-CM | POA: Diagnosis not present

## 2015-04-23 DIAGNOSIS — Z23 Encounter for immunization: Secondary | ICD-10-CM | POA: Diagnosis not present

## 2015-04-23 DIAGNOSIS — D509 Iron deficiency anemia, unspecified: Secondary | ICD-10-CM | POA: Diagnosis not present

## 2015-04-23 DIAGNOSIS — N186 End stage renal disease: Secondary | ICD-10-CM | POA: Diagnosis not present

## 2015-04-23 DIAGNOSIS — N2581 Secondary hyperparathyroidism of renal origin: Secondary | ICD-10-CM | POA: Diagnosis not present

## 2015-04-23 DIAGNOSIS — D631 Anemia in chronic kidney disease: Secondary | ICD-10-CM | POA: Diagnosis not present

## 2015-04-24 DIAGNOSIS — N186 End stage renal disease: Secondary | ICD-10-CM | POA: Diagnosis not present

## 2015-04-24 DIAGNOSIS — Z992 Dependence on renal dialysis: Secondary | ICD-10-CM | POA: Diagnosis not present

## 2015-04-25 DIAGNOSIS — Z23 Encounter for immunization: Secondary | ICD-10-CM | POA: Diagnosis not present

## 2015-04-25 DIAGNOSIS — Z992 Dependence on renal dialysis: Secondary | ICD-10-CM | POA: Diagnosis not present

## 2015-04-25 DIAGNOSIS — N186 End stage renal disease: Secondary | ICD-10-CM | POA: Diagnosis not present

## 2015-04-25 DIAGNOSIS — D509 Iron deficiency anemia, unspecified: Secondary | ICD-10-CM | POA: Diagnosis not present

## 2015-04-25 DIAGNOSIS — N2581 Secondary hyperparathyroidism of renal origin: Secondary | ICD-10-CM | POA: Diagnosis not present

## 2015-04-25 DIAGNOSIS — D631 Anemia in chronic kidney disease: Secondary | ICD-10-CM | POA: Diagnosis not present

## 2015-04-27 DIAGNOSIS — D631 Anemia in chronic kidney disease: Secondary | ICD-10-CM | POA: Diagnosis not present

## 2015-04-27 DIAGNOSIS — N2581 Secondary hyperparathyroidism of renal origin: Secondary | ICD-10-CM | POA: Diagnosis not present

## 2015-04-27 DIAGNOSIS — D509 Iron deficiency anemia, unspecified: Secondary | ICD-10-CM | POA: Diagnosis not present

## 2015-04-27 DIAGNOSIS — N186 End stage renal disease: Secondary | ICD-10-CM | POA: Diagnosis not present

## 2015-04-27 DIAGNOSIS — Z23 Encounter for immunization: Secondary | ICD-10-CM | POA: Diagnosis not present

## 2015-04-27 DIAGNOSIS — Z992 Dependence on renal dialysis: Secondary | ICD-10-CM | POA: Diagnosis not present

## 2015-04-30 DIAGNOSIS — Z23 Encounter for immunization: Secondary | ICD-10-CM | POA: Diagnosis not present

## 2015-04-30 DIAGNOSIS — D631 Anemia in chronic kidney disease: Secondary | ICD-10-CM | POA: Diagnosis not present

## 2015-04-30 DIAGNOSIS — Z992 Dependence on renal dialysis: Secondary | ICD-10-CM | POA: Diagnosis not present

## 2015-04-30 DIAGNOSIS — N186 End stage renal disease: Secondary | ICD-10-CM | POA: Diagnosis not present

## 2015-04-30 DIAGNOSIS — N2581 Secondary hyperparathyroidism of renal origin: Secondary | ICD-10-CM | POA: Diagnosis not present

## 2015-04-30 DIAGNOSIS — D509 Iron deficiency anemia, unspecified: Secondary | ICD-10-CM | POA: Diagnosis not present

## 2015-05-01 ENCOUNTER — Ambulatory Visit (INDEPENDENT_AMBULATORY_CARE_PROVIDER_SITE_OTHER): Payer: Medicare Other | Admitting: Cardiology

## 2015-05-01 ENCOUNTER — Encounter: Payer: Self-pay | Admitting: Cardiology

## 2015-05-01 VITALS — BP 97/62 | HR 93 | Ht 68.0 in | Wt 228.0 lb

## 2015-05-01 DIAGNOSIS — I10 Essential (primary) hypertension: Secondary | ICD-10-CM | POA: Diagnosis not present

## 2015-05-01 DIAGNOSIS — I2510 Atherosclerotic heart disease of native coronary artery without angina pectoris: Secondary | ICD-10-CM | POA: Diagnosis not present

## 2015-05-01 DIAGNOSIS — E785 Hyperlipidemia, unspecified: Secondary | ICD-10-CM | POA: Diagnosis not present

## 2015-05-01 DIAGNOSIS — R0789 Other chest pain: Secondary | ICD-10-CM

## 2015-05-01 MED ORDER — ISOSORBIDE MONONITRATE ER 60 MG PO TB24: 60.0000 mg | ORAL_TABLET | Freq: Every day | ORAL | Status: AC

## 2015-05-01 MED ORDER — RANOLAZINE ER 500 MG PO TB12: 500.0000 mg | ORAL_TABLET | Freq: Two times a day (BID) | ORAL | Status: AC

## 2015-05-01 MED ORDER — METOPROLOL TARTRATE 25 MG PO TABS: ORAL_TABLET | ORAL | Status: AC

## 2015-05-01 MED ORDER — EZETIMIBE 10 MG PO TABS: 10.0000 mg | ORAL_TABLET | Freq: Every morning | ORAL | Status: AC

## 2015-05-01 NOTE — Progress Notes (Signed)
Patient ID: Craig Castillo, male   DOB: February 06, 1933, 81 y.o.   MRN: [id number]     Clinical Summary Mr. Twohig is a 81 y.o.male seen today for follow up of the following medical problems.   1. Chest pain/CAD  - Prior lexiscan MPI that showed moderate area of ischemia in the inferolateral wall, overall intermediate risk. Had been medically managed due to advanced kidney disease and history of bladder tumor  - admit to [facility] with NSTEMI Jan 2015, cath as described below,received DES to LCX. Noted RCA disease with recs for medical management at this time to conserve dye load and renal function.  - Echo Jan 2015 LVEF 60-65% .  - recent admit to [facility] with displaced nephrostomy tube, anemia, and chest pain. Hgb was 6.8 at that time due to hematuria, transfused 2 units of pRBCs.Seen by inpatient cardiology, brillinta stopped at that time, remains on ASA.  - notes some recent chest pain. Started few months ago. Nonspecific pain, 6/10. Mainly occurs at rest. No other associated symptoms. Can be positional. Better with NG. Occurs daily.  - somehow he has been started on plavix, it is unclear how.    2. HTN  - does not check regularly  - compliant with meds   3. DM  - followed by Dr Gerarda Fraction   4 . Hyperlipidemia  - on gemfibrozil and zetia, followed Dr Gerarda Fraction.  - his statin history is unclear, appears he was on zocor a few years ago but from our records I cannot determine why it was stopped. The patient does not recall either.    5. Urinary obstruction - manged at [facility], has nephrostomy tubes  6. ESRD - on HD T,Th,Sat. On for 1 year.  Past Medical History  Diagnosis Date  . Dyslipidemia   . DJD (degenerative joint disease)   . Hypertension   . Diabetes mellitus   . BPH (benign prostatic hyperplasia)   . Coronary artery disease     a. 1997 s/p MI and prior RCA stenting;  b. 04/2013 MV: inflat ischemia;  c. 05/2013 Cath/PCI: LM 10-20, LAD 30-40p, LCX 95p (3.0x15 Xience  DES), 68m, OM1/2 30, RCA 50p diff ISR, 90d (med Rx).  . Bladder cancer   . Stroke   . Myocardial infarction ([place])   . CKD (chronic kidney disease) stage 3, GFR 30-59 ml/min     dialysis     Allergies  Allergen Reactions  . Aspirin     Due to swelling with celecoxib  . Celecoxib Swelling  . Hydrocodone-Acetaminophen Itching  . Morphine Itching  . Niacin Itching  . Other     sevelamer   . Piroxicam Itching  . Ciprofloxacin Rash     Current Outpatient Prescriptions  Medication Sig Dispense Refill  . amLODipine (NORVASC) 5 MG tablet Take 5 mg by mouth daily.    Marland Kitchen aspirin EC 81 MG tablet Take 81 mg by mouth daily.    Marland Kitchen atorvastatin (LIPITOR) 20 MG tablet Take 20 mg by mouth daily.    . clopidogrel (PLAVIX) 75 MG tablet Take 75 mg by mouth daily.    Marland Kitchen ezetimibe (ZETIA) 10 MG tablet Take 10 mg by mouth every morning.     . furosemide (LASIX) 40 MG tablet Take 40 mg by mouth 2 (two) times daily.     Marland Kitchen gemfibrozil (LOPID) 600 MG tablet Take 600 mg by mouth 2 (two) times daily before a meal.    . hydrochlorothiazide (HYDRODIURIL) 25 MG tablet Take 25  mg by mouth at bedtime.     . isosorbide mononitrate (IMDUR) 60 MG 24 hr tablet TAKE 1 TABLET BY MOUTH ONCE DAILY. 30 tablet 0  . metoprolol tartrate (LOPRESSOR) 25 MG tablet TAKE 1/2 TABLET BY MOUTH TWICE DAILY. 30 tablet 6  . nitroGLYCERIN (NITROSTAT) 0.4 MG SL tablet Place 1 tablet (0.4 mg total) under the tongue every 5 (five) minutes x 3 doses as needed for chest pain. Chest Pain 25 tablet 3  . omeprazole (PRILOSEC) 20 MG capsule Take 20 mg by mouth at bedtime.      No current facility-administered medications for this visit.     Past Surgical History  Procedure Laterality Date  . Cholecystectomy    . Esophagogastroduodenoscopy  06/08/2002    Soft stricture of the GE  junction with erosive esophagitis. The stricture was dilated to 38 [place] using a Maloney dilator. Swollen fold at GE junction on the gastric site which was  biopsied for histology and was suspicious for sentinel folds.  . Colonoscopy  06/08/2002    Small polyp was oblated via cold biopsy from the cecum and two were snared, one was at the rectosigmoid junction measuring about a centimeter, another one at the rectum which was smaller.  . Transurethral resection of bladder tumor      x7  . Coronary angioplasty  1997    3 stents  . Transurethral resection of bladder tumor  03/15/2012    Procedure: TRANSURETHRAL RESECTION OF BLADDER TUMOR (TURBT);  Surgeon: Marissa Nestle, MD;  Location: [facility];  Service: Urology;  Laterality: N/A;  . Cataract extraction w/phaco  06/20/2012    Procedure: CATARACT EXTRACTION PHACO AND INTRAOCULAR LENS PLACEMENT (IOC);  Surgeon: Tonny Tykwon Fera, MD;  Location: [facility];  Service: Ophthalmology;  Laterality: Right;  CDE=16.59  . Cataract extraction w/phaco Left 07/04/2012    Procedure: CATARACT EXTRACTION PHACO AND INTRAOCULAR LENS PLACEMENT (IOC);  Surgeon: Tonny Alinah Sheard, MD;  Location: [facility];  Service: Ophthalmology;  Laterality: Left;  CDE: 24.33  . Transurethral resection of bladder tumor N/A 09/29/2012    Procedure: TRANSURETHRAL RESECTION OF BLADDER TUMOR (TURBT);  Surgeon: Marissa Nestle, MD;  Location: [facility];  Service: Urology;  Laterality: N/A;  . Multiple tooth extractions    . Cystoscopy with biopsy N/A 04/27/2013    Procedure: CYSTOSCOPY WITH BLADDER BIOPSY & FULGURATION;  Surgeon: Marissa Nestle, MD;  Location: [facility];  Service: Urology;  Laterality: N/A;  . Left heart catheterization with coronary angiogram N/A 06/09/2013    Procedure: LEFT HEART CATHETERIZATION WITH CORONARY ANGIOGRAM;  Surgeon: Peter M Martinique, MD;  Location: [facility] CATH LAB;  Service: Cardiovascular;  Laterality: N/A;  . Percutaneous coronary rotoblator intervention (pci-r) N/A 06/12/2013    Procedure: PERCUTANEOUS CORONARY ROTOBLATOR INTERVENTION (PCI-R);  Surgeon: Blane Ohara, MD;  Location: [facility] CATH LAB;  Service: Cardiovascular;  Laterality:  N/A;  . Tonsillectomy    . Av fistula placement Right 09/05/2014    Procedure: BRACHIOCEPHALIC ARTERIOVENOUS (AV) FISTULA CREATION RIGHT ARM;  Surgeon: Mal Misty, MD;  Location: [place];  Service: Vascular;  Laterality: Right;     Allergies  Allergen Reactions  . Aspirin     Due to swelling with celecoxib  . Celecoxib Swelling  . Hydrocodone-Acetaminophen Itching  . Morphine Itching  . Niacin Itching  . Other     sevelamer   . Piroxicam Itching  . Ciprofloxacin Rash      Family History  Problem Relation Age of Onset  . Stroke  Mother 30  . Diabetes Brother 2  . Heart disease Father   . Heart attack Father      Social History Mr. Norquist reports that he quit smoking about 34 years ago. His smoking use included Cigarettes. He has a 10 pack-year smoking history. He has never used smokeless tobacco. Mr. Looney reports that he does not drink alcohol.   Review of Systems CONSTITUTIONAL: No weight loss, fever, chills, weakness or fatigue.  HEENT: Eyes: No visual loss, blurred vision, double vision or yellow sclerae.No hearing loss, sneezing, congestion, runny nose or sore throat.  SKIN: No rash or itching.  CARDIOVASCULAR: per hpi RESPIRATORY: No shortness of breath, cough or sputum.  GASTROINTESTINAL: No anorexia, nausea, vomiting or diarrhea. No abdominal pain or blood.  GENITOURINARY: No burning on urination, no polyuria NEUROLOGICAL: No headache, dizziness, syncope, paralysis, ataxia, numbness or tingling in the extremities. No change in bowel or bladder control.  MUSCULOSKELETAL: No muscle, back pain, joint pain or stiffness.  LYMPHATICS: No enlarged nodes. No history of splenectomy.  PSYCHIATRIC: No history of depression or anxiety.  ENDOCRINOLOGIC: No reports of sweating, cold or heat intolerance. No polyuria or polydipsia.  Marland Kitchen   Physical Examination Filed Vitals:   05/01/15 1028  BP: 97/62  Pulse: 93   Filed Vitals:   05/01/15 1028  Height: 5\' 8"   (1.727 m)  Weight: 228 lb (103.42 kg)    Gen: resting comfortably, no acute distress HEENT: no scleral icterus, pupils equal round and reactive, no palptable cervical adenopathy,  CV; rRR, no m/r/g, no jvd Resp: Clear to auscultation bilaterally GI: abdomen is soft, non-tender, non-distended, normal bowel sounds, no hepatosplenomegaly MSK: extremities are warm, no edema.  Skin: warm, no rash Neuro:  no focal deficits Psych: appropriate affect   Diagnostic Studies 11/2012 Echo: LVEF 55-60%, mild LVH, no WMAs,   11/2012 Carotid US:  Summary: Right: mild to moderate mixed plaque origin and proximal ICA.Left: moderate mixed plaque origin ICA and moderate soft plaque proximal ICA. Bilateral: 0-39% ICA stenosis. Vertebral artery flow is antegrade.  05/22/13 Lexiscan MPI  Intermediate risk Lexiscan Cardiolite as outlined. There were no clearly diagnostic ST segment changes, no arrhythmias were noted. Moderate-sized inferolateral ischemic defect noted consistent with potential circumflex or RCA stenosis. LVEF 54% with normal LV volumes.  Cardiac Catheterization 1.16.2015  Procedural Findings:  Hemodynamics:  AO 143/74 mean 106 mm Hg  LV 144/24 mm Hg  Coronary angiography:  Coronary dominance: right  Left mainstem: Mild tapering distal to 10-20%.  Left anterior descending (LAD): The proximal LAD is heavily calcified. There is diffuse 30-40% disease in the proximal vessel. There are multiple small diagonals without significant disease.  Left circumflex (LCx): The LCx gives off 2 OM branches that are moderate in size. The proximal LCx is heavily calcified with segmental 95% stenosis. The mid LCx and both OMs have mild disease diffusely to 30%.  Right coronary artery (RCA): The RCA appears to have stents from the proximal vessel to past the crux. There is diffuse calcification. There is diffuse 50% disease throughout the stented segment. There is a focal 90% stenosis at the  crux that appears to be within the stent.  Left ventriculography: Not done.  Final Conclusions:  1. 2 vessel obstructive CAD. The culprit lesion appears to be the proximal LCx. The RCA has extensive stenting with moderate diffuse in stent disease and a focal area of severe stenosis at the crux.  Recommendations: Recommend hydration over the weekend with close monitoring of renal function.  Given complexity of disease I would recommend staging PCI on Monday. Will load with Brilinta. I anticipate treating the LCx will require rotational atherectomy given the heavy calcification and I would recommend DES since he has complex lesion and CKD given high risk of restenosis. It may be best to treat the culprit vessel only then follow on medical therapy for the RCA disease.  _____________  Percutaneous Coronary Intervention 1.19.2015  Conclusions: Successful PCI of the LCx with a 3.0 x 15 mm Xience DES using adjunctive rotational atherectomy  Recommendations: ASA, Brilinta at least 12 months, aggressive med Rx for residual CAD.         Assessment and Plan  1. CAD  - s/p DES to LCX Jan 2015. Medically managing RCA disease. Completed more than 6 months of brillinta, now off after recent admission with hematuria and severe anemia.  - somehow back on plavix, there is no cardiac indication. He will discontinue.  - recent chest pain somewhat atypical. Will try ranexa 500mg  bid, given recurrent issues with hematuria would be difficult candidate for cath if ever came to that again.   2. HTN - at goal, continue current meds  3. Hyperlipidemia  - unclear statin history, he is on zetia and gemfibrozil so I presume he had some form of adverse reaction to statin previously.Defer to pcp.   4. Bladder obstruction  - bilateral nephrostomy tubes followed by urology.     F/u 6 weeks    Arnoldo Lenis, M.D.

## 2015-05-01 NOTE — Patient Instructions (Signed)
Your physician recommends that you schedule a follow-up appointment in: [place] [address] [place]  Your physician has recommended you make the following change in your medication:   START RANEXA 500 MG TWICE DAILY  HOLD PLAVIX UNTIL FURTHER NOTICE  Thank you for choosing [facility]!!

## 2015-05-02 DIAGNOSIS — D631 Anemia in chronic kidney disease: Secondary | ICD-10-CM | POA: Diagnosis not present

## 2015-05-02 DIAGNOSIS — N186 End stage renal disease: Secondary | ICD-10-CM | POA: Diagnosis not present

## 2015-05-02 DIAGNOSIS — D509 Iron deficiency anemia, unspecified: Secondary | ICD-10-CM | POA: Diagnosis not present

## 2015-05-02 DIAGNOSIS — N2581 Secondary hyperparathyroidism of renal origin: Secondary | ICD-10-CM | POA: Diagnosis not present

## 2015-05-02 DIAGNOSIS — Z992 Dependence on renal dialysis: Secondary | ICD-10-CM | POA: Diagnosis not present

## 2015-05-02 DIAGNOSIS — Z23 Encounter for immunization: Secondary | ICD-10-CM | POA: Diagnosis not present

## 2015-05-04 DIAGNOSIS — D631 Anemia in chronic kidney disease: Secondary | ICD-10-CM | POA: Diagnosis not present

## 2015-05-04 DIAGNOSIS — N186 End stage renal disease: Secondary | ICD-10-CM | POA: Diagnosis not present

## 2015-05-04 DIAGNOSIS — Z992 Dependence on renal dialysis: Secondary | ICD-10-CM | POA: Diagnosis not present

## 2015-05-04 DIAGNOSIS — N2581 Secondary hyperparathyroidism of renal origin: Secondary | ICD-10-CM | POA: Diagnosis not present

## 2015-05-04 DIAGNOSIS — Z23 Encounter for immunization: Secondary | ICD-10-CM | POA: Diagnosis not present

## 2015-05-04 DIAGNOSIS — D509 Iron deficiency anemia, unspecified: Secondary | ICD-10-CM | POA: Diagnosis not present

## 2015-05-07 DIAGNOSIS — Z23 Encounter for immunization: Secondary | ICD-10-CM | POA: Diagnosis not present

## 2015-05-07 DIAGNOSIS — N2581 Secondary hyperparathyroidism of renal origin: Secondary | ICD-10-CM | POA: Diagnosis not present

## 2015-05-07 DIAGNOSIS — D509 Iron deficiency anemia, unspecified: Secondary | ICD-10-CM | POA: Diagnosis not present

## 2015-05-07 DIAGNOSIS — Z992 Dependence on renal dialysis: Secondary | ICD-10-CM | POA: Diagnosis not present

## 2015-05-07 DIAGNOSIS — N186 End stage renal disease: Secondary | ICD-10-CM | POA: Diagnosis not present

## 2015-05-07 DIAGNOSIS — D631 Anemia in chronic kidney disease: Secondary | ICD-10-CM | POA: Diagnosis not present

## 2015-05-08 DIAGNOSIS — Z466 Encounter for fitting and adjustment of urinary device: Secondary | ICD-10-CM | POA: Diagnosis not present

## 2015-05-08 DIAGNOSIS — N1330 Unspecified hydronephrosis: Secondary | ICD-10-CM | POA: Diagnosis not present

## 2015-05-10 ENCOUNTER — Telehealth: Payer: Self-pay | Admitting: Cardiology

## 2015-05-10 MED ORDER — NITROGLYCERIN 0.4 MG SL SUBL: 0.4000 mg | SUBLINGUAL_TABLET | SUBLINGUAL | Status: AC | PRN

## 2015-05-10 NOTE — Telephone Encounter (Signed)
nitroGLYCERIN (NITROSTAT) 0.4 MG SL tablet  Medication     [place] [facility]  -

## 2015-05-11 ENCOUNTER — Emergency Department (HOSPITAL_COMMUNITY): Payer: Medicare Other

## 2015-05-11 ENCOUNTER — Encounter (HOSPITAL_COMMUNITY): Payer: Self-pay | Admitting: Emergency Medicine

## 2015-05-11 DIAGNOSIS — M549 Dorsalgia, unspecified: Secondary | ICD-10-CM

## 2015-05-11 DIAGNOSIS — Z79899 Other long term (current) drug therapy: Secondary | ICD-10-CM | POA: Diagnosis not present

## 2015-05-11 DIAGNOSIS — E785 Hyperlipidemia, unspecified: Secondary | ICD-10-CM | POA: Diagnosis not present

## 2015-05-11 DIAGNOSIS — Z8673 Personal history of transient ischemic attack (TIA), and cerebral infarction without residual deficits: Secondary | ICD-10-CM | POA: Diagnosis not present

## 2015-05-11 DIAGNOSIS — I252 Old myocardial infarction: Secondary | ICD-10-CM | POA: Diagnosis not present

## 2015-05-11 DIAGNOSIS — C679 Malignant neoplasm of bladder, unspecified: Secondary | ICD-10-CM | POA: Diagnosis present

## 2015-05-11 DIAGNOSIS — N400 Enlarged prostate without lower urinary tract symptoms: Secondary | ICD-10-CM | POA: Diagnosis not present

## 2015-05-11 DIAGNOSIS — D649 Anemia, unspecified: Secondary | ICD-10-CM | POA: Diagnosis not present

## 2015-05-11 DIAGNOSIS — I25118 Atherosclerotic heart disease of native coronary artery with other forms of angina pectoris: Secondary | ICD-10-CM | POA: Diagnosis not present

## 2015-05-11 DIAGNOSIS — R092 Respiratory arrest: Secondary | ICD-10-CM

## 2015-05-11 DIAGNOSIS — R7989 Other specified abnormal findings of blood chemistry: Secondary | ICD-10-CM | POA: Diagnosis present

## 2015-05-11 DIAGNOSIS — N2581 Secondary hyperparathyroidism of renal origin: Secondary | ICD-10-CM | POA: Diagnosis not present

## 2015-05-11 DIAGNOSIS — Z936 Other artificial openings of urinary tract status: Secondary | ICD-10-CM | POA: Diagnosis not present

## 2015-05-11 DIAGNOSIS — Z992 Dependence on renal dialysis: Secondary | ICD-10-CM

## 2015-05-11 DIAGNOSIS — Z955 Presence of coronary angioplasty implant and graft: Secondary | ICD-10-CM

## 2015-05-11 DIAGNOSIS — Z87891 Personal history of nicotine dependence: Secondary | ICD-10-CM

## 2015-05-11 DIAGNOSIS — I209 Angina pectoris, unspecified: Secondary | ICD-10-CM | POA: Diagnosis not present

## 2015-05-11 DIAGNOSIS — Z9115 Patient's noncompliance with renal dialysis: Secondary | ICD-10-CM | POA: Diagnosis not present

## 2015-05-11 DIAGNOSIS — R9431 Abnormal electrocardiogram [ECG] [EKG]: Secondary | ICD-10-CM | POA: Diagnosis present

## 2015-05-11 DIAGNOSIS — E875 Hyperkalemia: Secondary | ICD-10-CM | POA: Diagnosis not present

## 2015-05-11 DIAGNOSIS — I4510 Unspecified right bundle-branch block: Secondary | ICD-10-CM | POA: Diagnosis not present

## 2015-05-11 DIAGNOSIS — R531 Weakness: Secondary | ICD-10-CM | POA: Diagnosis not present

## 2015-05-11 DIAGNOSIS — E8770 Fluid overload, unspecified: Secondary | ICD-10-CM | POA: Diagnosis not present

## 2015-05-11 DIAGNOSIS — I120 Hypertensive chronic kidney disease with stage 5 chronic kidney disease or end stage renal disease: Secondary | ICD-10-CM | POA: Diagnosis present

## 2015-05-11 DIAGNOSIS — E1122 Type 2 diabetes mellitus with diabetic chronic kidney disease: Secondary | ICD-10-CM | POA: Diagnosis present

## 2015-05-11 DIAGNOSIS — R109 Unspecified abdominal pain: Secondary | ICD-10-CM

## 2015-05-11 DIAGNOSIS — R079 Chest pain, unspecified: Secondary | ICD-10-CM | POA: Diagnosis not present

## 2015-05-11 DIAGNOSIS — N186 End stage renal disease: Secondary | ICD-10-CM | POA: Diagnosis present

## 2015-05-11 DIAGNOSIS — I469 Cardiac arrest, cause unspecified: Secondary | ICD-10-CM | POA: Diagnosis not present

## 2015-05-11 DIAGNOSIS — R404 Transient alteration of awareness: Secondary | ICD-10-CM | POA: Diagnosis not present

## 2015-05-11 DIAGNOSIS — Z7982 Long term (current) use of aspirin: Secondary | ICD-10-CM

## 2015-05-11 DIAGNOSIS — I10 Essential (primary) hypertension: Secondary | ICD-10-CM | POA: Diagnosis not present

## 2015-05-11 DIAGNOSIS — I249 Acute ischemic heart disease, unspecified: Secondary | ICD-10-CM | POA: Diagnosis present

## 2015-05-11 DIAGNOSIS — R310 Gross hematuria: Secondary | ICD-10-CM | POA: Diagnosis not present

## 2015-05-11 DIAGNOSIS — I214 Non-ST elevation (NSTEMI) myocardial infarction: Secondary | ICD-10-CM | POA: Diagnosis not present

## 2015-05-11 DIAGNOSIS — N1330 Unspecified hydronephrosis: Secondary | ICD-10-CM | POA: Diagnosis not present

## 2015-05-11 DIAGNOSIS — Z9181 History of falling: Secondary | ICD-10-CM | POA: Diagnosis not present

## 2015-05-11 DIAGNOSIS — D631 Anemia in chronic kidney disease: Secondary | ICD-10-CM | POA: Diagnosis not present

## 2015-05-11 DIAGNOSIS — M898X9 Other specified disorders of bone, unspecified site: Secondary | ICD-10-CM | POA: Diagnosis not present

## 2015-05-11 DIAGNOSIS — R778 Other specified abnormalities of plasma proteins: Secondary | ICD-10-CM | POA: Diagnosis present

## 2015-05-11 HISTORY — DX: Headache, unspecified: R51.9

## 2015-05-11 HISTORY — DX: Dorsalgia, unspecified: M54.9

## 2015-05-11 HISTORY — DX: Headache: R51

## 2015-05-11 HISTORY — DX: Dependence on renal dialysis: Z99.2

## 2015-05-11 HISTORY — DX: Hydroureter: N13.4

## 2015-05-11 HISTORY — DX: End stage renal disease: N18.6

## 2015-05-11 HISTORY — DX: Other chronic pain: G89.29

## 2015-05-11 HISTORY — DX: Unspecified hydronephrosis: N13.30

## 2015-05-11 LAB — CBC WITH DIFFERENTIAL/PLATELET
BASOS PCT: 0 %
Basophils Absolute: 0 10*3/uL (ref 0.0–0.1)
Eosinophils Absolute: 0.3 10*3/uL (ref 0.0–0.7)
Eosinophils Relative: 4 %
HEMATOCRIT: 31.6 % — AB (ref 39.0–52.0)
Hemoglobin: 10.3 g/dL — ABNORMAL LOW (ref 13.0–17.0)
LYMPHS ABS: 0.8 10*3/uL (ref 0.7–4.0)
Lymphocytes Relative: 11 %
MCH: 33.6 pg (ref 26.0–34.0)
MCHC: 32.6 g/dL (ref 30.0–36.0)
MCV: 102.9 fL — AB (ref 78.0–100.0)
MONO ABS: 0.4 10*3/uL (ref 0.1–1.0)
Monocytes Relative: 5 %
Neutro Abs: 5.6 10*3/uL (ref 1.7–7.7)
Neutrophils Relative %: 80 %
Platelets: 273 10*3/uL (ref 150–400)
RBC: 3.07 MIL/uL — ABNORMAL LOW (ref 4.22–5.81)
RDW: 14.5 % (ref 11.5–15.5)
WBC: 7 10*3/uL (ref 4.0–10.5)

## 2015-05-11 LAB — BASIC METABOLIC PANEL
Anion gap: 21 — ABNORMAL HIGH (ref 5–15)
BUN: 82 mg/dL — AB (ref 6–20)
CO2: 19 mmol/L — ABNORMAL LOW (ref 22–32)
CREATININE: 11.61 mg/dL — AB (ref 0.61–1.24)
Calcium: 7.7 mg/dL — ABNORMAL LOW (ref 8.9–10.3)
Chloride: 97 mmol/L — ABNORMAL LOW (ref 101–111)
GFR calc Af Amer: 4 mL/min — ABNORMAL LOW (ref >60)
GFR calc non Af Amer: 4 mL/min — ABNORMAL LOW (ref >60)
GLUCOSE: 195 mg/dL — AB (ref 65–99)
POTASSIUM: 4.4 mmol/L (ref 3.5–5.1)
Sodium: 137 mmol/L (ref 135–145)

## 2015-05-11 LAB — URINE MICROSCOPIC-ADD ON: SQUAMOUS EPITHELIAL / LPF: NONE SEEN

## 2015-05-11 LAB — URINALYSIS, ROUTINE W REFLEX MICROSCOPIC
GLUCOSE, UA: NEGATIVE mg/dL
NITRITE: POSITIVE — AB
PH: 5.5 (ref 5.0–8.0)
Protein, ur: >300 mg/dL — AB
SPECIFIC GRAVITY, URINE: 1.025 (ref 1.005–1.030)

## 2015-05-11 LAB — TSH: TSH: 1.686 u[IU]/mL (ref 0.350–4.500)

## 2015-05-11 LAB — LACTIC ACID, PLASMA
LACTIC ACID, VENOUS: 1.2 mmol/L (ref 0.5–2.0)
Lactic Acid, Venous: 1.2 mmol/L (ref 0.5–2.0)

## 2015-05-11 LAB — BRAIN NATRIURETIC PEPTIDE: B NATRIURETIC PEPTIDE 5: 1129 pg/mL — AB (ref 0.0–100.0)

## 2015-05-11 LAB — MRSA PCR SCREENING: MRSA BY PCR: NEGATIVE

## 2015-05-11 LAB — TROPONIN I
Troponin I: 0.4 ng/mL — ABNORMAL HIGH (ref <0.031)
Troponin I: 0.42 ng/mL — ABNORMAL HIGH (ref <0.031)

## 2015-05-11 MED ORDER — HEPARIN (PORCINE) IN NACL 100-0.45 UNIT/ML-% IJ SOLN
1100.0000 [IU]/h | INTRAMUSCULAR | Status: DC
  Administered 2015-05-11: 950 [IU]/h via INTRAVENOUS
  Filled 2015-05-11: qty 250

## 2015-05-11 MED ORDER — ACETAMINOPHEN 325 MG PO TABS: 650.0000 mg | ORAL_TABLET | Freq: Four times a day (QID) | ORAL | Status: DC | PRN

## 2015-05-11 MED ORDER — HEPARIN BOLUS VIA INFUSION
4000.0000 [IU] | Freq: Once | INTRAVENOUS | Status: AC
  Administered 2015-05-11: 4000 [IU] via INTRAVENOUS
  Filled 2015-05-11: qty 4000

## 2015-05-11 MED ORDER — SALINE SPRAY 0.65 % NA SOLN: 1.0000 | NASAL | Status: DC | PRN

## 2015-05-11 MED ORDER — LANTHANUM CARBONATE 500 MG PO CHEW
1500.0000 mg | CHEWABLE_TABLET | Freq: Three times a day (TID) | ORAL | Status: DC
  Administered 2015-05-12: 1500 mg via ORAL
  Filled 2015-05-11: qty 3

## 2015-05-11 MED ORDER — IOHEXOL 300 MG/ML  SOLN: 50.0000 mL | Freq: Once | INTRAMUSCULAR | Status: DC | PRN

## 2015-05-11 MED ORDER — TAMSULOSIN HCL 0.4 MG PO CAPS
0.4000 mg | ORAL_CAPSULE | Freq: Every day | ORAL | Status: DC
  Administered 2015-05-11: 0.4 mg via ORAL
  Filled 2015-05-11: qty 1

## 2015-05-11 MED ORDER — ISOSORBIDE MONONITRATE ER 60 MG PO TB24
60.0000 mg | ORAL_TABLET | Freq: Every day | ORAL | Status: DC
  Administered 2015-05-12: 60 mg via ORAL
  Filled 2015-05-11: qty 1

## 2015-05-11 MED ORDER — GEMFIBROZIL 600 MG PO TABS
600.0000 mg | ORAL_TABLET | Freq: Two times a day (BID) | ORAL | Status: DC
  Administered 2015-05-12: 600 mg via ORAL
  Filled 2015-05-11 (×3): qty 1

## 2015-05-11 MED ORDER — ATORVASTATIN CALCIUM 20 MG PO TABS
20.0000 mg | ORAL_TABLET | Freq: Every day | ORAL | Status: DC
  Administered 2015-05-12: 20 mg via ORAL
  Filled 2015-05-11: qty 1

## 2015-05-11 MED ORDER — IOHEXOL 300 MG/ML  SOLN: 100.0000 mL | Freq: Once | INTRAMUSCULAR | Status: DC | PRN

## 2015-05-11 MED ORDER — NITROGLYCERIN 0.4 MG SL SUBL: 0.4000 mg | SUBLINGUAL_TABLET | SUBLINGUAL | Status: DC | PRN

## 2015-05-11 MED ORDER — ONDANSETRON HCL 4 MG PO TABS: 4.0000 mg | ORAL_TABLET | Freq: Four times a day (QID) | ORAL | Status: DC | PRN

## 2015-05-11 MED ORDER — SODIUM CHLORIDE 0.9 % IJ SOLN
3.0000 mL | Freq: Two times a day (BID) | INTRAMUSCULAR | Status: DC
  Administered 2015-05-11: 3 mL via INTRAVENOUS

## 2015-05-11 MED ORDER — SODIUM CHLORIDE 0.9 % IV SOLN: 250.0000 mL | INTRAVENOUS | Status: DC | PRN

## 2015-05-11 MED ORDER — METOPROLOL TARTRATE 12.5 MG HALF TABLET
12.5000 mg | ORAL_TABLET | Freq: Two times a day (BID) | ORAL | Status: DC
  Administered 2015-05-11 – 2015-05-12 (×2): 12.5 mg via ORAL
  Filled 2015-05-11 (×2): qty 1

## 2015-05-11 MED ORDER — ASPIRIN EC 81 MG PO TBEC
81.0000 mg | DELAYED_RELEASE_TABLET | Freq: Every day | ORAL | Status: DC
  Administered 2015-05-12: 81 mg via ORAL
  Filled 2015-05-11: qty 1

## 2015-05-11 MED ORDER — EZETIMIBE 10 MG PO TABS
10.0000 mg | ORAL_TABLET | Freq: Every morning | ORAL | Status: DC
  Administered 2015-05-12: 10 mg via ORAL
  Filled 2015-05-11: qty 1

## 2015-05-11 MED ORDER — SODIUM CHLORIDE 0.9 % IJ SOLN: 3.0000 mL | INTRAMUSCULAR | Status: DC | PRN

## 2015-05-11 MED ORDER — SENNOSIDES-DOCUSATE SODIUM 8.6-50 MG PO TABS: 1.0000 | ORAL_TABLET | Freq: Every evening | ORAL | Status: DC | PRN

## 2015-05-11 MED ORDER — ACETAMINOPHEN 650 MG RE SUPP: 650.0000 mg | Freq: Four times a day (QID) | RECTAL | Status: DC | PRN

## 2015-05-11 MED ORDER — RANOLAZINE ER 500 MG PO TB12
500.0000 mg | ORAL_TABLET | Freq: Two times a day (BID) | ORAL | Status: DC
  Administered 2015-05-11 – 2015-05-12 (×2): 500 mg via ORAL
  Filled 2015-05-11 (×2): qty 1

## 2015-05-11 MED ORDER — PANTOPRAZOLE SODIUM 40 MG PO TBEC
40.0000 mg | DELAYED_RELEASE_TABLET | Freq: Every evening | ORAL | Status: DC
  Administered 2015-05-11: 40 mg via ORAL
  Filled 2015-05-11: qty 1

## 2015-05-11 MED ORDER — SODIUM CHLORIDE 0.9 % IV SOLN
INTRAVENOUS | Status: AC
  Administered 2015-05-11: 23:00:00 via INTRAVENOUS

## 2015-05-11 MED ORDER — DIATRIZOATE MEGLUMINE & SODIUM 66-10 % PO SOLN
ORAL | Status: AC
  Administered 2015-05-11: 16:00:00
  Filled 2015-05-11: qty 30

## 2015-05-11 MED ORDER — ONDANSETRON HCL 4 MG/2ML IJ SOLN: 4.0000 mg | Freq: Four times a day (QID) | INTRAMUSCULAR | Status: DC | PRN

## 2015-05-11 NOTE — Progress Notes (Signed)
ANTICOAGULATION CONSULT NOTE - Initial Consult  Pharmacy Consult for heparin Indication: chest pain/ACS  Allergies  Allergen Reactions  . Aspirin Swelling    Due to swelling with celecoxib, Bodily swelling   . Celecoxib Swelling    Bodily Swelling  . Hydrocodone-Acetaminophen Itching  . Morphine Itching  . Niacin Itching  . Other     sevelamer   . Piroxicam Itching  . Ciprofloxacin Rash    Patient Measurements: Height: 5\' 8"  (172.7 cm) Weight: 220 lb (99.791 kg) IBW/kg (Calculated) : 68.4 Heparin Dosing Weight: 90 kg  Vital Signs: Temp: 97.3 F (36.3 C) (12/17 2101) Temp Source: Oral (12/17 2101) BP: 115/63 mmHg (12/17 2145) Pulse Rate: 73 (12/17 2145)  Labs:  Recent Labs  05/24/2015 1520  HGB 10.3*  HCT 31.6*  PLT 273  CREATININE 11.61*  TROPONINI 0.40*    Estimated Creatinine Clearance: 5.9 mL/min (by C-G formula based on Cr of 11.61).   Medical History: Past Medical History  Diagnosis Date  . Dyslipidemia   . DJD (degenerative joint disease)   . Hypertension   . BPH (benign prostatic hyperplasia)   . Coronary artery disease     a. 1997 s/p MI and prior RCA stenting;  b. 04/2013 MV: inflat ischemia;  c. 05/2013 Cath/PCI: LM 10-20, LAD 30-40p, LCX 95p (3.0x15 Xience DES), 43m, OM1/2 30, RCA 50p diff ISR, 90d (med Rx).  . Bladder cancer ([place])   . Stroke ([place])   . Myocardial infarction ([place])   . Hydroureter     bilateral  . CKD (chronic kidney disease) stage 3, GFR 30-59 ml/min     dialysis  . Hydronephrosis     bilateral  . ESRD on hemodialysis ([place])     Tu, Th, Sa  . Chronic back pain   . Headache     Medications:  Prescriptions prior to admission  Medication Sig Dispense Refill Last Dose  . aspirin EC 81 MG tablet Take 81 mg by mouth daily.   05/21/2015 at 0600  . atorvastatin (LIPITOR) 20 MG tablet Take 20 mg by mouth daily.   04/26/2015 at 0600  . ezetimibe (ZETIA) 10 MG tablet Take 1 tablet (10 mg total) by mouth every morning. 90 tablet  3 05/08/2015 at 0600  . FOSRENOL 500 MG chewable tablet Chew 3 tablets by mouth 3 (three) times daily with meals.   05/05/2015 at 0600  . gemfibrozil (LOPID) 600 MG tablet Take 600 mg by mouth 2 (two) times daily before a meal.   04/27/2015 at 0600  . isosorbide mononitrate (IMDUR) 60 MG 24 hr tablet Take 1 tablet (60 mg total) by mouth daily. 90 tablet 3 05/04/2015 at 0600  . metoprolol tartrate (LOPRESSOR) 25 MG tablet TAKE 1/2 TABLET BY MOUTH TWICE DAILY. 90 tablet 3 05/11/2015 at 0600  . nitroGLYCERIN (NITROSTAT) 0.4 MG SL tablet Place 1 tablet (0.4 mg total) under the tongue every 5 (five) minutes x 3 doses as needed for chest pain. Chest Pain 25 tablet 3 05/10/2015 at 2200  . omeprazole (PRILOSEC) 20 MG capsule Take 20 mg by mouth at bedtime.    05/11/2015 at 0600  . ranolazine (RANEXA) 500 MG 12 hr tablet Take 1 tablet (500 mg total) by mouth 2 (two) times daily. 180 tablet 3  at 0600  . sodium chloride (OCEAN) 0.65 % SOLN nasal spray Place 1 spray into both nostrils as needed for congestion.   05/10/2015  . tamsulosin (FLOMAX) 0.4 MG CAPS capsule Take 1 capsule by mouth daily.  05/10/2015 at 0600    Assessment: 81 y/o male with ESRD who presented to the [facility] ED with back pain and SOB with exertion transferred to [facility] for evaluation. Pharmacy consulted to begin IV heparin for ACS. No bleeding noted, Hb low, platelets are normal. Troponin is elevated at 0.4.  Goal of Therapy:  Heparin level 0.3-0.7 units/ml Monitor platelets by anticoagulation protocol: Yes   Plan:  - Heparin 4000 units IV bolus then 950 units/hr - 8 hr HL - Daily HL and CBC - Monitor for s/sx of bleeding  [facility], Pharm.D., BCPS Clinical Pharmacist Pager: [phone] 05/20/2015 10:11 PM

## 2015-05-11 NOTE — ED Provider Notes (Signed)
CSN: [id number]     Arrival date & time 05/09/2015  1445 History   First MD Initiated Contact with Patient 05/10/2015 1511     Chief Complaint  Patient presents with  . Back Pain  . Weakness    generalized     HPI Pt was seen at 1510.  Per pt, c/o gradual onset and worsening of generalized weakness for the past 3 days. Pt also c/o LBP, "decreased urination," "some" hematuria, as well as DOE. Pt states all his symptoms began after he had his bilat nephrostomy tubes exchanged at [facility] 3 days ago. Pt unclear if his back pain is acute flair of his chronic back pain. Pt states he also "missed dialysis" Thursday and today due to his symptoms. Denies fevers, no abd pain, no N/V/D, no rash, no CP/palpitations, no cough, no focal motor weakness, no tingling/numbness in extremities, no saddle anesthesia, no injury.    Past Medical History  Diagnosis Date  . Dyslipidemia   . DJD (degenerative joint disease)   . Hypertension   . Diabetes mellitus   . BPH (benign prostatic hyperplasia)   . Coronary artery disease     a. 1997 s/p MI and prior RCA stenting;  b. 04/2013 MV: inflat ischemia;  c. 05/2013 Cath/PCI: LM 10-20, LAD 30-40p, LCX 95p (3.0x15 Xience DES), 54m, OM1/2 30, RCA 50p diff ISR, 90d (med Rx).  . Bladder cancer ([place])   . Stroke ([place])   . Myocardial infarction ([place])   . Hydroureter     bilateral  . CKD (chronic kidney disease) stage 3, GFR 30-59 ml/min     dialysis  . Hydronephrosis     bilateral  . ESRD on hemodialysis ([place])     Tu, Th, Sa  . Chronic back pain    Past Surgical History  Procedure Laterality Date  . Cholecystectomy    . Esophagogastroduodenoscopy  06/08/2002    Soft stricture of the GE  junction with erosive esophagitis. The stricture was dilated to 67 [place] using a Maloney dilator. Swollen fold at GE junction on the gastric site which was biopsied for histology and was suspicious for sentinel folds.  . Colonoscopy  06/08/2002    Small polyp was oblated via  cold biopsy from the cecum and two were snared, one was at the rectosigmoid junction measuring about a centimeter, another one at the rectum which was smaller.  . Transurethral resection of bladder tumor      x7  . Coronary angioplasty  1997    3 stents  . Transurethral resection of bladder tumor  03/15/2012    Procedure: TRANSURETHRAL RESECTION OF BLADDER TUMOR (TURBT);  Surgeon: Marissa Nestle, MD;  Location: [facility];  Service: Urology;  Laterality: N/A;  . Cataract extraction w/phaco  06/20/2012    Procedure: CATARACT EXTRACTION PHACO AND INTRAOCULAR LENS PLACEMENT (IOC);  Surgeon: Tonny Branch, MD;  Location: [facility];  Service: Ophthalmology;  Laterality: Right;  CDE=16.59  . Cataract extraction w/phaco Left 07/04/2012    Procedure: CATARACT EXTRACTION PHACO AND INTRAOCULAR LENS PLACEMENT (IOC);  Surgeon: Tonny Branch, MD;  Location: [facility];  Service: Ophthalmology;  Laterality: Left;  CDE: 24.33  . Transurethral resection of bladder tumor N/A 09/29/2012    Procedure: TRANSURETHRAL RESECTION OF BLADDER TUMOR (TURBT);  Surgeon: Marissa Nestle, MD;  Location: [facility];  Service: Urology;  Laterality: N/A;  . Multiple tooth extractions    . Cystoscopy with biopsy N/A 04/27/2013    Procedure: CYSTOSCOPY WITH BLADDER BIOPSY & FULGURATION;  Surgeon: Marissa Nestle, MD;  Location: [facility];  Service: Urology;  Laterality: N/A;  . Left heart catheterization with coronary angiogram N/A 06/09/2013    Procedure: LEFT HEART CATHETERIZATION WITH CORONARY ANGIOGRAM;  Surgeon: Peter M Martinique, MD;  Location: [facility] CATH LAB;  Service: Cardiovascular;  Laterality: N/A;  . Percutaneous coronary rotoblator intervention (pci-r) N/A 06/12/2013    Procedure: PERCUTANEOUS CORONARY ROTOBLATOR INTERVENTION (PCI-R);  Surgeon: Blane Ohara, MD;  Location: [facility] CATH LAB;  Service: Cardiovascular;  Laterality: N/A;  . Tonsillectomy    . Av fistula placement Right 09/05/2014    Procedure: BRACHIOCEPHALIC ARTERIOVENOUS (AV) FISTULA  CREATION RIGHT ARM;  Surgeon: Mal Misty, MD;  Location: [facility] OR;  Service: Vascular;  Laterality: Right;   Family History  Problem Relation Age of Onset  . Stroke Mother 36  . Diabetes Brother 28  . Heart disease Father   . Heart attack Father    Social History  Substance Use Topics  . Smoking status: Former Smoker -- 0.50 packs/day for 20 years    Types: Cigarettes    Start date: 05/20/1951    Quit date: 03/08/1981  . Smokeless tobacco: Never Used  . Alcohol Use: No    Review of Systems ROS: Statement: All systems negative except as marked or noted in the HPI; Constitutional: Negative for fever and chills. +generalized weakness/fatigue.; ; Eyes: Negative for eye pain, redness and discharge. ; ; ENMT: Negative for ear pain, hoarseness, nasal congestion, sinus pressure and sore throat. ; ; Cardiovascular: Negative for chest pain, palpitations, diaphoresis, and peripheral edema. ; ; Respiratory: +SOB. Negative for cough, wheezing and stridor. ; ; Gastrointestinal: Negative for nausea, vomiting, diarrhea, abdominal pain, blood in stool, hematemesis, jaundice and rectal bleeding. . ; ; Genitourinary: Negative for dysuria, flank pain, +"decreased urination," +hematuria. ; ; Musculoskeletal: +LBP. Negative for neck pain. Negative for swelling and trauma.; ; Skin: Negative for pruritus, rash, abrasions, blisters, bruising and skin lesion.; ; Neuro: Negative for headache, lightheadedness and neck stiffness. Negative for altered level of consciousness , altered mental status, extremity weakness, paresthesias, involuntary movement, seizure and syncope.      Allergies  Aspirin; Celecoxib; Hydrocodone-acetaminophen; Morphine; Niacin; Other; Piroxicam; and Ciprofloxacin  Home Medications   Prior to Admission medications   Medication Sig Start Date End Date Taking? Authorizing Provider  aspirin EC 81 MG tablet Take 81 mg by mouth daily.    Historical Provider, MD  atorvastatin (LIPITOR) 20 MG  tablet Take 20 mg by mouth daily.    Historical Provider, MD  clopidogrel (PLAVIX) 75 MG tablet Take 75 mg by mouth daily.    Historical Provider, MD  ezetimibe (ZETIA) 10 MG tablet Take 1 tablet (10 mg total) by mouth every morning. 05/01/15   Arnoldo Lenis, MD  FOSRENOL 500 MG chewable tablet Chew 3 tablets by mouth 3 (three) times daily with meals. 04/11/15   Historical Provider, MD  gemfibrozil (LOPID) 600 MG tablet Take 600 mg by mouth 2 (two) times daily before a meal.    Historical Provider, MD  isosorbide mononitrate (IMDUR) 60 MG 24 hr tablet Take 1 tablet (60 mg total) by mouth daily. 05/01/15   Arnoldo Lenis, MD  metoprolol tartrate (LOPRESSOR) 25 MG tablet TAKE 1/2 TABLET BY MOUTH TWICE DAILY. 05/01/15   Arnoldo Lenis, MD  nitroGLYCERIN (NITROSTAT) 0.4 MG SL tablet Place 1 tablet (0.4 mg total) under the tongue every 5 (five) minutes x 3 doses as needed for chest pain. Chest Pain 05/10/15  Arnoldo Lenis, MD  omeprazole (PRILOSEC) 20 MG capsule Take 20 mg by mouth at bedtime.     Historical Provider, MD  ranolazine (RANEXA) 500 MG 12 hr tablet Take 1 tablet (500 mg total) by mouth 2 (two) times daily. 05/01/15   Arnoldo Lenis, MD  tamsulosin (FLOMAX) 0.4 MG CAPS capsule Take 1 capsule by mouth daily. 04/22/15   Historical Provider, MD   BP 101/64 mmHg  Pulse 78  Temp(Src) 97.5 F (36.4 C) (Oral)  Resp 18  Ht 5\' 8"  (1.727 m)  Wt 220 lb (99.791 kg)  BMI 33.46 kg/m2  SpO2 97%   16:03 Orthostatic Vital Signs AC  Orthostatic Lying  - BP- Lying: 119/71 mmHg ; Pulse- Lying: 72  Orthostatic Sitting - BP- Sitting: 119/70 mmHg ; Pulse- Sitting: 70  Orthostatic Standing at 0 minutes - BP- Standing at 0 minutes: 115/60 mmHg ; Pulse- Standing at 0 minutes: 75      Physical Exam  1515: Physical examination:  Nursing notes reviewed; Vital signs and O2 SAT reviewed;  Constitutional: Well developed, Well nourished, In no acute distress; Head:  Normocephalic, atraumatic;  Eyes: EOMI, PERRL, No scleral icterus; ENMT: Mouth and pharynx normal, Mucous membranes dry; Neck: Supple, Full range of motion, No lymphadenopathy; Cardiovascular: Regular rate and rhythm, No gallop; Respiratory: Breath sounds clear & equal bilaterally, No wheezes.  Speaking full sentences with ease, Normal respiratory effort/excursion; Chest: Nontender, Movement normal; Abdomen: Soft, Nontender, Nondistended, Normal bowel sounds; Genitourinary: No CVA tenderness; Spine:  No midline CS, TS, LS tenderness. +bilat nephrostomy tube sites without drainage, erythema; +bilat nephrostomy tube bags with cloudy urine (baseline per EPIC chart review), no visualized gross hematuria.;; Extremities: Pulses normal, No tenderness, No edema, No calf edema or asymmetry.; Neuro: AA&Ox3, Major CN grossly intact. No facial droop. Speech clear. No gross focal motor or sensory deficits in extremities.; Skin: Color normal, Warm, Dry.   ED Course  Procedures (including critical care time) Labs Review   Imaging Review  I have personally reviewed and evaluated these images and lab results as part of my medical decision-making.   EKG Interpretation   Date/Time:  Saturday May 11 2015 15:33:55 EST Ventricular Rate:  71 PR Interval:  252 QRS Duration: 167 QT Interval:  471 QTC Calculation: 512 R Axis:   11 Text Interpretation:  Sinus rhythm Atrial premature complex Prolonged PR  interval Right bundle branch block ST-t wave abnormality Lateral leads  ST-t abnormality Inferior leads Baseline wander Artifact When compared  with ECG of 10/18/2014 ST-t wave abnormality is now Present Confirmed by  [facility]  MD, Nunzio Cory [phone]) on 04/27/2015 4:21:07 PM      EKG Interpretation  Date/Time:  Saturday May 11 2015 15:37:06 EST Ventricular Rate:  72 PR Interval:  257 QRS Duration: 168 QT Interval:  449 QTC Calculation: 491 R Axis:   11 Text Interpretation:  Sinus rhythm Prolonged PR interval Right bundle  branch block ST-t abnormality Lateral leads ST-t wave abnormality Inferior leads Baseline wander Since last tracing of earlier today No significant change was found Confirmed by [facility]  MD, Nunzio Cory [phone]) on 05/23/2015 4:25:57 PM        MDM  MDM Reviewed: previous chart, nursing note and vitals Reviewed previous: labs and ECG Interpretation: labs, ECG, x-ray and CT scan Total time providing critical care: 30-74 minutes. This excludes time spent performing separately reportable procedures and services. Consults: admitting MD     CRITICAL CARE Performed by: Alfonzo Feller Total critical care time: 57  minutes Critical care time was exclusive of separately billable procedures and treating other patients. Critical care was necessary to treat or prevent imminent or life-threatening deterioration. Critical care was time spent personally by me on the following activities: development of treatment plan with patient and/or surrogate as well as nursing, discussions with consultants, evaluation of patient's response to treatment, examination of patient, obtaining history from patient or surrogate, ordering and performing treatments and interventions, ordering and review of laboratory studies, ordering and review of radiographic studies, pulse oximetry and re-evaluation of patient's condition.  Results for orders placed or performed during the hospital encounter of 05/14/2015  Urinalysis, Routine w reflex microscopic (not at [facility])  Result Value Ref Range   Color, Urine RED (A) YELLOW   APPearance CLOUDY (A) CLEAR   Specific Gravity, Urine 1.025 1.005 - 1.030   pH 5.5 5.0 - 8.0   Glucose, UA NEGATIVE NEGATIVE mg/dL   Hgb urine dipstick LARGE (A) NEGATIVE   Bilirubin Urine MODERATE (A) NEGATIVE   Ketones, ur TRACE (A) NEGATIVE mg/dL   Protein, ur >300 (A) NEGATIVE mg/dL   Nitrite POSITIVE (A) NEGATIVE   Leukocytes, UA MODERATE (A) NEGATIVE  Basic metabolic panel  Result Value Ref Range    Sodium 137 135 - 145 mmol/L   Potassium 4.4 3.5 - 5.1 mmol/L   Chloride 97 (L) 101 - 111 mmol/L   CO2 19 (L) 22 - 32 mmol/L   Glucose, Bld 195 (H) 65 - 99 mg/dL   BUN 82 (H) 6 - 20 mg/dL   Creatinine, Ser 11.61 (H) 0.61 - 1.24 mg/dL   Calcium 7.7 (L) 8.9 - 10.3 mg/dL   GFR calc non Af Amer 4 (L) >60 mL/min   GFR calc Af Amer 4 (L) >60 mL/min   Anion gap 21 (H) 5 - 15  CBC with Differential  Result Value Ref Range   WBC 7.0 4.0 - 10.5 K/uL   RBC 3.07 (L) 4.22 - 5.81 MIL/uL   Hemoglobin 10.3 (L) 13.0 - 17.0 g/dL   HCT 31.6 (L) 39.0 - 52.0 %   MCV 102.9 (H) 78.0 - 100.0 fL   MCH 33.6 26.0 - 34.0 pg   MCHC 32.6 30.0 - 36.0 g/dL   RDW 14.5 11.5 - 15.5 %   Platelets 273 150 - 400 K/uL   Neutrophils Relative % 80 %   Neutro Abs 5.6 1.7 - 7.7 K/uL   Lymphocytes Relative 11 %   Lymphs Abs 0.8 0.7 - 4.0 K/uL   Monocytes Relative 5 %   Monocytes Absolute 0.4 0.1 - 1.0 K/uL   Eosinophils Relative 4 %   Eosinophils Absolute 0.3 0.0 - 0.7 K/uL   Basophils Relative 0 %   Basophils Absolute 0.0 0.0 - 0.1 K/uL  Lactic acid, plasma  Result Value Ref Range   Lactic Acid, Venous 1.2 0.5 - 2.0 mmol/L  Troponin I  Result Value Ref Range   Troponin I 0.40 (H) <0.031 ng/mL  Brain natriuretic peptide  Result Value Ref Range   B Natriuretic Peptide 1129.0 (H) 0.0 - 100.0 pg/mL  Urine microscopic-add on  Result Value Ref Range   Squamous Epithelial / LPF NONE SEEN NONE SEEN   WBC, UA 0-5 0 - 5 WBC/hpf   RBC / HPF 0-5 0 - 5 RBC/hpf   Bacteria, UA FEW (A) NONE SEEN   Urine-Other YEAST PRESENT     Ct Abdomen Pelvis Wo Contrast 05/11/2015  CLINICAL DATA:  Back pain starting after a bilateral nephrostomy  tube exchange, decreased urine output and hematuria. Patient also reports chronic low back pain and shortness of breath with exertion. Patient missed dialysis twice this week with concern for associated fluid overload. EXAM: CT ABDOMEN AND PELVIS WITHOUT CONTRAST TECHNIQUE: Multidetector CT  imaging of the abdomen and pelvis was performed following the standard protocol without IV contrast. COMPARISON:  CT abdomen pelvis dated 10/28/2014. FINDINGS: Lower chest: No acute findings. Prominent coronary artery calcifications noted at the heart base. Hepatobiliary: Status post cholecystectomy.  Liver is unremarkable. Pancreas: No mass or inflammatory process identified on this un-enhanced exam. Spleen: Upper normal in size. Adrenals/Urinary Tract: Bilateral nephrostomy tubes remain adequately positioned, although the left nephrostomy tube appears more peripherally positioned on today's study and there is increased hydroureter on the left which may indicate suboptimal location. Bladder is decompressed. Stomach/Bowel: Bowel is normal in caliber. No bowel wall thickening or evidence of bowel wall inflammation seen. Appendix is normal. Vascular/Lymphatic: Scattered atherosclerotic changes of the normal- aorta. No enlarged lymph nodes seen. Reproductive: No mass or other significant abnormality. Other: No free fluid or abscess collection identified. No free intraperitoneal air. Musculoskeletal: Degenerative changes throughout the thoracolumbar spine but no acute osseous abnormality. Superficial soft tissues are unremarkable. Left inguinal hernia again noted containing fat only. IMPRESSION: 1. Bilateral nephrostomy tubes in place. Left nephrostomy tube is slightly more peripherally positioned on today's exam, at the posterior margin of the left renal pelvis. As there is increased hydroureter on the left, this suggests suboptimal location of the nephrostomy catheter possibly requiring repositioning. 2. Additional chronic/incidental findings detailed above. Electronically Signed   By: Franki Cabot M.D.   On: 05/05/2015 17:03   Dg Chest 2 View 05/22/2015  CLINICAL DATA:  End-stage renal disease, missed hemodialysis 3 days ago, hypertension, diabetes mellitus, coronary artery disease post MI, former smoker, bladder  cancer, prior stroke EXAM: CHEST  2 VIEW COMPARISON:  07/21/2014 FINDINGS: Enlargement of cardiac silhouette. Mediastinal contours and pulmonary vascularity normal. Lungs clear. No pleural effusion or pneumothorax. Bones demineralized with endplate spur formation thoracic spine. IMPRESSION: Enlargement of cardiac silhouette. No acute abnormalities. Electronically Signed   By: Lavonia Dana M.D.   On: 05/09/2015 17:07    1810:   Pt continues to deny CP. States he "just feels weak." Pt endorses "allergy" to ASA; he has already taken plavix today. Pt with fluid overload, but no overt CHF on CXR; will need HD treatment when admitted. H/H per baseline. Dx and testing d/w pt.  Questions answered.  Verb understanding, agreeable to admit/transfer to [facility]. T/C to [facility] Dr. Jerilee Hoh, case discussed, including:  HPI, pertinent PM/SHx, VS/PE, dx testing, ED course and treatment:  Agreeable to transfer to [facility] for admission, requests to consult [facility] Cards MD and she will call consult for [facility]al MD.  T/C to [facility] Cards Fellow Dr. Clayborne Artist, case discussed, including:  HPI, pertinent PM/SHx, VS/PE, dx testing, ED course and treatment:  Agreeable to consult, requests to have [facility] Hospitalist page him for consult after pt's arrival to [facility].      Francine Graven, DO 05/14/15 2038

## 2015-05-11 NOTE — ED Notes (Addendum)
Pt reports back pain that started after getting bilateral nephrostomy tube exchange. Pt reports decreased urine output and blood in urine. Pt also reports chronic low back pain and SHOB with exertion. Pt reports last dialysis was Tuesday but missed Thursday, usually goes to dialysis 3 times per week

## 2015-05-11 NOTE — H&P (Signed)
[facility]          History and Physical    PCP:   Glo Herring., MD   EDP: Francine Graven, D.O.  Chief Complaint:  Generalized weakness, chest pain, falls, missed dialysis session  HPI: Patient is a 81 year old man with multiple medical comorbidities including coronary artery disease status post drug-eluting stent to circumflex in January 2015 who completed 6 months of Brillinta,   during cardiac cath Was also noted to have RCA disease however recommendation was for medical management to conserve dye load and renal function. He has had continued chest pain and last saw cardiology on 12/7 at which time it was decided to try him on Ranexa. He also has a past medical history significant for end-stage renal disease and has missed his last 2 dialysis sessions, hypertension, hyperlipidemia and also has bladder cancer and is status post bilateral nephrostomy tubes. He typically gets these exchanged at [facility] and these were just exchanged about 3 days ago. Patient states that in the past week he has had increasing weakness, increasing chest pain and has been taking nitroglycerin at least 3-4 times a day, has fallen a few times, states he missed dialysis because of the weakness. In the emergency department is found to have a troponin of 0.40, EKG shows ST depressions and T-wave inversions in the inferior and lateral leads which are new from prior EKG in May 2016. He also had a CT scan of the abdomen that shows a peripherally positioned left nephrostomy tube with increased hydroureter on the left which suggests suboptimal location of the nephrostomy catheter possibly requiring repositioning. We have been asked to admit him for further evaluation and management.   Allergies:   Allergies  Allergen Reactions  . Aspirin Swelling    Due to swelling with celecoxib, Bodily swelling   . Celecoxib Swelling    Bodily Swelling  . Hydrocodone-Acetaminophen Itching  . Morphine  Itching  . Niacin Itching  . Other     sevelamer   . Piroxicam Itching  . Ciprofloxacin Rash      Past Medical History  Diagnosis Date  . Dyslipidemia   . DJD (degenerative joint disease)   . Hypertension   . Diabetes mellitus   . BPH (benign prostatic hyperplasia)   . Coronary artery disease     a. 1997 s/p MI and prior RCA stenting;  b. 04/2013 MV: inflat ischemia;  c. 05/2013 Cath/PCI: LM 10-20, LAD 30-40p, LCX 95p (3.0x15 Xience DES), 28m OM1/2 30, RCA 50p diff ISR, 90d (med Rx).  . Bladder cancer ([facility]   . Stroke ([facility]   . Myocardial infarction ([facility]   . Hydroureter     bilateral  . CKD (chronic kidney disease) stage 3, GFR 30-59 ml/min     dialysis  . Hydronephrosis     bilateral  . ESRD on hemodialysis ([facility]     Tu, Th, Sa  . Chronic back pain     Past Surgical History  Procedure Laterality Date  . Cholecystectomy    . Esophagogastroduodenoscopy  06/08/2002    Soft stricture of the GE  junction with erosive esophagitis. The stricture was dilated [place] dil[place]. Swollen fold at GE junction on the gastric site which was biopsied for histology and was suspicious for sentinel folds.  . Colonoscopy  06/08/2002    Small polyp was oblated via cold biopsy from the cecum and two  were snared, one was at the rectosigmoid junction measuring about a centimeter, another one at the rectum which was smaller.  . Transurethral resection of bladder tumor      x7  . Coronary angioplasty  1997    3 stents  . Transurethral resection of bladder tumor  03/15/2012    Procedure: TRANSURETHRAL RESECTION OF BLADDER TUMOR (TURBT);  Surgeon: Marissa Nestle, MD;  Location: [facility];  Service: Urology;  Laterality: N/A;  . Cataract extraction w/phaco  06/20/2012    Procedure: CATARACT EXTRACTION PHACO AND INTRAOCULAR LENS PLACEMENT (IOC);  Surgeon: Tonny Branch, MD;  Location: [facility];  Service: Ophthalmology;  Laterality: Right;  CDE=16.59  . Cataract extraction w/phaco  Left 07/04/2012    Procedure: CATARACT EXTRACTION PHACO AND INTRAOCULAR LENS PLACEMENT (IOC);  Surgeon: Tonny Branch, MD;  Location: [facility];  Service: Ophthalmology;  Laterality: Left;  CDE: 24.33  . Transurethral resection of bladder tumor N/A 09/29/2012    Procedure: TRANSURETHRAL RESECTION OF BLADDER TUMOR (TURBT);  Surgeon: Marissa Nestle, MD;  Location: [facility];  Service: Urology;  Laterality: N/A;  . Multiple tooth extractions    . Cystoscopy with biopsy N/A 04/27/2013    Procedure: CYSTOSCOPY WITH BLADDER BIOPSY & FULGURATION;  Surgeon: Marissa Nestle, MD;  Location: [facility];  Service: Urology;  Laterality: N/A;  . Left heart catheterization with coronary angiogram N/A 06/09/2013    Procedure: LEFT HEART CATHETERIZATION WITH CORONARY ANGIOGRAM;  Surgeon: Peter M Martinique, MD;  Location: [facility] CATH LAB;  Service: Cardiovascular;  Laterality: N/A;  . Percutaneous coronary rotoblator intervention (pci-r) N/A 06/12/2013    Procedure: PERCUTANEOUS CORONARY ROTOBLATOR INTERVENTION (PCI-R);  Surgeon: Blane Ohara, MD;  Location: [facility] CATH LAB;  Service: Cardiovascular;  Laterality: N/A;  . Tonsillectomy    . Av fistula placement Right 09/05/2014    Procedure: BRACHIOCEPHALIC ARTERIOVENOUS (AV) FISTULA CREATION RIGHT ARM;  Surgeon: Mal Misty, MD;  Location: [place];  Service: Vascular;  Laterality: Right;    Prior to Admission medications   Medication Sig Start Date End Date Taking? Authorizing Provider  aspirin EC 81 MG tablet Take 81 mg by mouth daily.   Yes Historical Provider, MD  atorvastatin (LIPITOR) 20 MG tablet Take 20 mg by mouth daily.   Yes Historical Provider, MD  ezetimibe (ZETIA) 10 MG tablet Take 1 tablet (10 mg total) by mouth every morning. 05/01/15  Yes Arnoldo Lenis, MD  FOSRENOL 500 MG chewable tablet Chew 3 tablets by mouth 3 (three) times daily with meals. 04/11/15  Yes Historical Provider, MD  gemfibrozil (LOPID) 600 MG tablet Take 600 mg by mouth 2 (two) times daily before  a meal.   Yes Historical Provider, MD  isosorbide mononitrate (IMDUR) 60 MG 24 hr tablet Take 1 tablet (60 mg total) by mouth daily. 05/01/15  Yes Arnoldo Lenis, MD  metoprolol tartrate (LOPRESSOR) 25 MG tablet TAKE 1/2 TABLET BY MOUTH TWICE DAILY. 05/01/15  Yes Arnoldo Lenis, MD  nitroGLYCERIN (NITROSTAT) 0.4 MG SL tablet Place 1 tablet (0.4 mg total) under the tongue every 5 (five) minutes x 3 doses as needed for chest pain. Chest Pain 05/10/15  Yes Arnoldo Lenis, MD  omeprazole (PRILOSEC) 20 MG capsule Take 20 mg by mouth at bedtime.    Yes Historical Provider, MD  sodium chloride (OCEAN) 0.65 % SOLN nasal spray Place 1 spray into both nostrils as needed for congestion.   Yes Historical Provider, MD  tamsulosin (FLOMAX) 0.4 MG CAPS capsule Take  1 capsule by mouth daily. 04/22/15  Yes Historical Provider, MD  ranolazine (RANEXA) 500 MG 12 hr tablet Take 1 tablet (500 mg total) by mouth 2 (two) times daily. 05/01/15   Arnoldo Lenis, MD    Social History:  reports that he quit smoking about 34 years ago. His smoking use included Cigarettes. He started smoking about 64 years ago. He has a 10 pack-year smoking history. He has never used smokeless tobacco. He reports that he does not drink alcohol or use illicit drugs.  Family History  Problem Relation Age of Onset  . Stroke Mother 33  . Diabetes Brother 60  . Heart disease Father   . Heart attack Father     Review of Systems:  Constitutional: Denies fever, chills, diaphoresis, appetite change HEENT: Denies photophobia, eye pain, redness, hearing loss, ear pain, congestion, sore throat, rhinorrhea, sneezing, mouth sores, trouble swallowing, neck pain, neck stiffness and tinnitus.   Respiratory: Denies SOB, DOE,, chest tightness,   Cardiovascular: Denies  palpitations and leg swelling.  Gastrointestinal: Denies nausea, vomiting, abdominal pain, diarrhea, constipation, blood in stool and abdominal distention.  Genitourinary: Denies  dysuria, urgency, frequency, hematuria, flank pain and difficulty urinating.  Endocrine: Denies: hot or cold intolerance, sweats, changes in hair or nails, polyuria, polydipsia. Musculoskeletal: Denies myalgias, back pain, joint swelling, arthralgias and gait problem.  Skin: Denies pallor, rash and wound.  Neurological: Denies dizziness, seizures, syncope, weakness, light-headedness, numbness and headaches.  Hematological: Denies adenopathy. Easy bruising, personal or family bleeding history  Psychiatric/Behavioral: Denies suicidal ideation, mood changes, confusion, nervousness, sleep disturbance and agitation   Physical Exam: Blood pressure 107/51, pulse 68, temperature 97.5 F (36.4 C), temperature source Oral, resp. rate 16, height '5\' 8"'$  (1.727 m), weight 99.791 kg (220 lb), SpO2 98 %.  general: Alert, awake, oriented 3 HEENT: Normocephalic, atraumatic, pupils reactive to light, extraocular movements intact Neck: Supple, no JVD, no lymphadenopathy, no bruits, no goiter Cardiovascular: Regular rate and rhythm Lungs: Clear to auscultation bilaterally Abdomen: Soft, nontender, nondistended, positive bowel sounds present extremities: 1+ pitting edema bilaterally, bilateral leg bags attached Neurologic: Grossly intact and nonfocal  Labs on Admission:  Results for orders placed or performed during the hospital encounter of 04/25/2015 (from the past 48 hour(s))  Urinalysis, Routine w reflex microscopic (not at [facility])     Status: Abnormal   Collection Time: 05/10/2015  3:08 PM  Result Value Ref Range   Color, Urine RED (A) YELLOW    Comment: BIOCHEMICALS MAY BE AFFECTED BY COLOR   APPearance CLOUDY (A) CLEAR   Specific Gravity, Urine 1.025 1.005 - 1.030   pH 5.5 5.0 - 8.0   Glucose, UA NEGATIVE NEGATIVE mg/dL   Hgb urine dipstick LARGE (A) NEGATIVE   Bilirubin Urine MODERATE (A) NEGATIVE   Ketones, ur TRACE (A) NEGATIVE mg/dL   Protein, ur >300 (A) NEGATIVE mg/dL   Nitrite POSITIVE (A)  NEGATIVE   Leukocytes, UA MODERATE (A) NEGATIVE  Urine microscopic-add on     Status: Abnormal   Collection Time: 05/22/2015  3:08 PM  Result Value Ref Range   Squamous Epithelial / LPF NONE SEEN NONE SEEN   WBC, UA 0-5 0 - 5 WBC/hpf   RBC / HPF 0-5 0 - 5 RBC/hpf   Bacteria, UA FEW (A) NONE SEEN   Urine-Other YEAST PRESENT   Basic metabolic panel     Status: Abnormal   Collection Time: 04/29/2015  3:20 PM  Result Value Ref Range   Sodium 137 135 -  145 mmol/L   Potassium 4.4 3.5 - 5.1 mmol/L   Chloride 97 (L) 101 - 111 mmol/L   CO2 19 (L) 22 - 32 mmol/L   Glucose, Bld 195 (H) 65 - 99 mg/dL   BUN 82 (H) 6 - 20 mg/dL   Creatinine, Ser 11.61 (H) 0.61 - 1.24 mg/dL   Calcium 7.7 (L) 8.9 - 10.3 mg/dL   GFR calc non Af Amer 4 (L) >60 mL/min   GFR calc Af Amer 4 (L) >60 mL/min    Comment: (NOTE) The eGFR has been calculated using the CKD EPI equation. This calculation has not been validated in all clinical situations. eGFR's persistently <60 mL/min signify possible Chronic Kidney Disease.    Anion gap 21 (H) 5 - 15  CBC with Differential     Status: Abnormal   Collection Time: 05/22/2015  3:20 PM  Result Value Ref Range   WBC 7.0 4.0 - 10.5 K/uL   RBC 3.07 (L) 4.22 - 5.81 MIL/uL   Hemoglobin 10.3 (L) 13.0 - 17.0 g/dL   HCT 31.6 (L) 39.0 - 52.0 %   MCV 102.9 (H) 78.0 - 100.0 fL   MCH 33.6 26.0 - 34.0 pg   MCHC 32.6 30.0 - 36.0 g/dL   RDW 14.5 11.5 - 15.5 %   Platelets 273 150 - 400 K/uL   Neutrophils Relative % 80 %   Neutro Abs 5.6 1.7 - 7.7 K/uL   Lymphocytes Relative 11 %   Lymphs Abs 0.8 0.7 - 4.0 K/uL   Monocytes Relative 5 %   Monocytes Absolute 0.4 0.1 - 1.0 K/uL   Eosinophils Relative 4 %   Eosinophils Absolute 0.3 0.0 - 0.7 K/uL   Basophils Relative 0 %   Basophils Absolute 0.0 0.0 - 0.1 K/uL  Lactic acid, plasma     Status: None   Collection Time: 05/19/2015  3:20 PM  Result Value Ref Range   Lactic Acid, Venous 1.2 0.5 - 2.0 mmol/L  Troponin I     Status: Abnormal    Collection Time: 05/10/2015  3:20 PM  Result Value Ref Range   Troponin I 0.40 (H) <0.031 ng/mL    Comment:        PERSISTENTLY INCREASED TROPONIN VALUES IN THE RANGE OF 0.04-0.49 ng/mL CAN BE SEEN IN:       -UNSTABLE ANGINA       -CONGESTIVE HEART FAILURE       -MYOCARDITIS       -CHEST TRAUMA       -ARRYHTHMIAS       -LATE PRESENTING MYOCARDIAL INFARCTION       -COPD   CLINICAL FOLLOW-UP RECOMMENDED.   Brain natriuretic peptide     Status: Abnormal   Collection Time: 04/29/2015  3:20 PM  Result Value Ref Range   B Natriuretic Peptide 1129.0 (H) 0.0 - 100.0 pg/mL    Radiological Exams on Admission: Ct Abdomen Pelvis Wo Contrast  05/19/2015  CLINICAL DATA:  Back pain starting after a bilateral nephrostomy tube exchange, decreased urine output and hematuria. Patient also reports chronic low back pain and shortness of breath with exertion. Patient missed dialysis twice this week with concern for associated fluid overload. EXAM: CT ABDOMEN AND PELVIS WITHOUT CONTRAST TECHNIQUE: Multidetector CT imaging of the abdomen and pelvis was performed following the standard protocol without IV contrast. COMPARISON:  CT abdomen pelvis dated 10/28/2014. FINDINGS: Lower chest: No acute findings. Prominent coronary artery calcifications noted at the heart base. Hepatobiliary: Status post cholecystectomy.  Liver is unremarkable. Pancreas: No mass or inflammatory process identified on this un-enhanced exam. Spleen: Upper normal in size. Adrenals/Urinary Tract: Bilateral nephrostomy tubes remain adequately positioned, although the left nephrostomy tube appears more peripherally positioned on today's study and there is increased hydroureter on the left which may indicate suboptimal location. Bladder is decompressed. Stomach/Bowel: Bowel is normal in caliber. No bowel wall thickening or evidence of bowel wall inflammation seen. Appendix is normal. Vascular/Lymphatic: Scattered atherosclerotic changes of the normal-  aorta. No enlarged lymph nodes seen. Reproductive: No mass or other significant abnormality. Other: No free fluid or abscess collection identified. No free intraperitoneal air. Musculoskeletal: Degenerative changes throughout the thoracolumbar spine but no acute osseous abnormality. Superficial soft tissues are unremarkable. Left inguinal hernia again noted containing fat only. IMPRESSION: 1. Bilateral nephrostomy tubes in place. Left nephrostomy tube is slightly more peripherally positioned on today's exam, at the posterior margin of the left renal pelvis. As there is increased hydroureter on the left, this suggests suboptimal location of the nephrostomy catheter possibly requiring repositioning. 2. Additional chronic/incidental findings detailed above. Electronically Signed   By: Franki Cabot M.D.   On: 05/13/2015 17:03   Dg Chest 2 View  04/26/2015  CLINICAL DATA:  End-stage renal disease, missed hemodialysis 3 days ago, hypertension, diabetes mellitus, coronary artery disease post MI, former smoker, bladder cancer, prior stroke EXAM: CHEST  2 VIEW COMPARISON:  07/21/2014 FINDINGS: Enlargement of cardiac silhouette. Mediastinal contours and pulmonary vascularity normal. Lungs clear. No pleural effusion or pneumothorax. Bones demineralized with endplate spur formation thoracic spine. IMPRESSION: Enlargement of cardiac silhouette. No acute abnormalities. Electronically Signed   By: Lavonia Dana M.D.   On: 04/26/2015 17:07    Assessment/Plan Principal Problem:   Chest pain Active Problems:   Malignant neoplasm of bladder (HCC)   HLD (hyperlipidemia)   Essential hypertension   ESRD on hemodialysis (HCC)   Abnormal EKG   Generalized weakness   Elevated troponin   ACS (acute coronary syndrome) (HCC)    Acute coronary syndrome -Continued chest pain with elevated troponin, EKG changes and weakness and fatigue which could certainly be an anginal equivalent, raise concern for acute coronary  syndrome. -Given lack of cardiology coverage at [facility], will request transfer to [facility]. -EDP has discussed case with cardiology who will see patient in consultation upon arrival to [facility]. -We'll start heparin drip, need to be careful given his history of hematuria and recent prolonged hospital stay at [facility] secondary to this at which time his Pattricia Boss was discontinued. -Cycle troponins, recheck EKG in a.m. -Continue aspirin, beta blocker.  Malignant neoplasm of bladder -This is followed by urology at [facility]. -Given findings on CT scan, may consider urology consultation for better positioning of left nephrostomy tube once cardiac status is assessed.  End-stage renal disease -On hemodialysis Tuesday, Thursday, Saturday. -Missed last 2 dialysis sessions given acute illness. -Has no indications for emergent hemodialysis, is not hyperkalemic, is not massively volume overloaded, suspect he can be dialyzed in a.m. and will need nephrology consultation.  Generalized weakness -Suspect due to acute coronary syndrome plus chronic fatigue due to malignancy.  -Will request PT evaluation.  Hypertension -Continue home medications.  Hyperlipidemia -Continue statin.  CODE STATUS -Full code  DVT prophylaxis -On full dose anticoagulation   Time Spent on Admission:  100 minutes  HERNANDEZ ACOSTA,ESTELA [facility] Pager: [phone] 04/30/2015, 6:56 PM

## 2015-05-11 NOTE — Consult Note (Signed)
Cardiology Consult    Patient ID: Craig Castillo MRN: [id number], DOB/AGE: 79-Jan-1934   Admit date: 05/17/2015 Date of Consult: 05/22/2015  Primary Physician: Glo Herring., MD Primary Cardiologist: Harl Bowie Requesting Provider: Tennis Must  Patient Profile    CC: Chest pain  Past Medical History   Past Medical History  Diagnosis Date  . Dyslipidemia   . DJD (degenerative joint disease)   . Hypertension   . BPH (benign prostatic hyperplasia)   . Coronary artery disease     a. 1997 s/p MI and prior RCA stenting;  b. 04/2013 MV: inflat ischemia;  c. 05/2013 Cath/PCI: LM 10-20, LAD 30-40p, LCX 95p (3.0x15 Xience DES), 23m, OM1/2 30, RCA 50p diff ISR, 90d (med Rx).  . Bladder cancer ([place])   . Stroke ([place])   . Myocardial infarction ([place])   . Hydroureter     bilateral  . CKD (chronic kidney disease) stage 3, GFR 30-59 ml/min     dialysis  . Hydronephrosis     bilateral  . ESRD on hemodialysis ([place])     Tu, Th, Sa  . Chronic back pain   . Headache     Past Surgical History  Procedure Laterality Date  . Cholecystectomy    . Esophagogastroduodenoscopy  06/08/2002    Soft stricture of the GE  junction with erosive esophagitis. The stricture was dilated to 81 [place] using a Maloney dilator. Swollen fold at GE junction on the gastric site which was biopsied for histology and was suspicious for sentinel folds.  . Colonoscopy  06/08/2002    Small polyp was oblated via cold biopsy from the cecum and two were snared, one was at the rectosigmoid junction measuring about a centimeter, another one at the rectum which was smaller.  . Transurethral resection of bladder tumor      x7  . Coronary angioplasty  1997    3 stents  . Transurethral resection of bladder tumor  03/15/2012    Procedure: TRANSURETHRAL RESECTION OF BLADDER TUMOR (TURBT);  Surgeon: Marissa Nestle, MD;  Location: [facility];  Service: Urology;  Laterality: N/A;  . Cataract extraction w/phaco  06/20/2012   Procedure: CATARACT EXTRACTION PHACO AND INTRAOCULAR LENS PLACEMENT (IOC);  Surgeon: Tonny Branch, MD;  Location: [facility];  Service: Ophthalmology;  Laterality: Right;  CDE=16.59  . Cataract extraction w/phaco Left 07/04/2012    Procedure: CATARACT EXTRACTION PHACO AND INTRAOCULAR LENS PLACEMENT (IOC);  Surgeon: Tonny Branch, MD;  Location: [facility];  Service: Ophthalmology;  Laterality: Left;  CDE: 24.33  . Transurethral resection of bladder tumor N/A 09/29/2012    Procedure: TRANSURETHRAL RESECTION OF BLADDER TUMOR (TURBT);  Surgeon: Marissa Nestle, MD;  Location: [facility];  Service: Urology;  Laterality: N/A;  . Multiple tooth extractions    . Cystoscopy with biopsy N/A 04/27/2013    Procedure: CYSTOSCOPY WITH BLADDER BIOPSY & FULGURATION;  Surgeon: Marissa Nestle, MD;  Location: [facility];  Service: Urology;  Laterality: N/A;  . Left heart catheterization with coronary angiogram N/A 06/09/2013    Procedure: LEFT HEART CATHETERIZATION WITH CORONARY ANGIOGRAM;  Surgeon: Peter M Martinique, MD;  Location: [facility] CATH LAB;  Service: Cardiovascular;  Laterality: N/A;  . Percutaneous coronary rotoblator intervention (pci-r) N/A 06/12/2013    Procedure: PERCUTANEOUS CORONARY ROTOBLATOR INTERVENTION (PCI-R);  Surgeon: Blane Ohara, MD;  Location: [facility] CATH LAB;  Service: Cardiovascular;  Laterality: N/A;  . Tonsillectomy    . Av fistula placement Right 09/05/2014    Procedure: BRACHIOCEPHALIC ARTERIOVENOUS (AV) FISTULA  CREATION RIGHT ARM;  Surgeon: Mal Misty, MD;  Location: [place];  Service: Vascular;  Laterality: Right;     Allergies  Allergies  Allergen Reactions  . Aspirin Swelling    Due to swelling with celecoxib, Bodily swelling   . Celecoxib Swelling    Bodily Swelling  . Hydrocodone-Acetaminophen Itching  . Morphine Itching  . Niacin Itching  . Other     sevelamer   . Piroxicam Itching  . Ciprofloxacin Rash    History of Present Illness    The patient is a 81M with a history of CAD s/p PCI  to LCx in 05/2013, HTN, HLD, Dm2, ESRD on HD who presented to the [facility] ED today with generalized weakness x 1 week. He reports that he generally has felt unwell and yesterday he sustained a fall on his way to the bathroom. He has significant lower back pain that was acutely worsened over the past 1-2 days. He also has a history of bladder cancer and has bilateral nephrostomy tubes. He also has a history of ESRD and   He has had chest pain over the past month. He was recently seen by Dr. Harl Bowie on 05/01/2015. At that time his clopidogrel was discontinued and he was placed on ranolazine for angina. He was felt to be a poor candidate for cath at that time due to his comorbidities, specifically hematuria.  In the ED, his EKG revealed lateral ST depressions. Troponin was slightly elevated to 0.40. We are consulted for evaluation and management of his chest pain.   Inpatient Medications    . sodium chloride   Intravenous STAT  . [START ON May 16, 2015] aspirin EC  81 mg Oral Daily  . [START ON May 16, 2015] atorvastatin  20 mg Oral Daily  . [START ON 05/16/2015] ezetimibe  10 mg Oral q morning - 10a  . [START ON May 16, 2015] gemfibrozil  600 mg Oral BID AC  . [START ON 05/16/15] isosorbide mononitrate  60 mg Oral Daily  . [START ON 2015-05-16] lanthanum  1,500 mg Oral TID WC  . metoprolol tartrate  12.5 mg Oral BID  . pantoprazole  40 mg Oral QPM  . ranolazine  500 mg Oral BID  . sodium chloride  3 mL Intravenous Q12H  . tamsulosin  0.4 mg Oral QHS    Family History    Family History  Problem Relation Age of Onset  . Stroke Mother 34  . Diabetes Brother 104  . Heart disease Father   . Heart attack Father     Social History    Social History   Social History  . Marital Status: Married    Spouse Name: N/A  . Number of Children: N/A  . Years of Education: N/A   Occupational History  . Retired    Social History Main Topics  . Smoking status: Former Smoker -- 0.50 packs/day for 20  years    Types: Cigarettes    Start date: 05/20/1951    Quit date: 03/08/1981  . Smokeless tobacco: Never Used  . Alcohol Use: No  . Drug Use: No  . Sexual Activity: No   Other Topics Concern  . Not on file   Social History Narrative   Married   No regular exercise   Retired Armed forces training and education officer              Review of Systems    General:  No chills, fever, night sweats or weight changes.  Cardiovascular:  +chest pain, -dyspnea on exertion, -edema, -  orthopnea, palpitations, -paroxysmal nocturnal dyspnea. Dermatological: No rash, lesions/masses Respiratory: No cough, dyspnea Urologic: +hematuria, +dysuria Abdominal:   No nausea, vomiting, diarrhea, bright red blood per rectum, melena, or hematemesis Neurologic:  No visual changes, wkns, changes in mental status. All other systems reviewed and are otherwise negative except as noted above.  Physical Exam    Blood pressure 110/56, pulse 77, temperature 97.3 F (36.3 C), temperature source Oral, resp. rate 18, height 5\' 8"  (1.727 m), weight 220 lb (99.791 kg), SpO2 96 %.  General: Pleasant, NAD Psych: Flat affect. Neuro: Alert and oriented X 3. Moves all extremities spontaneously. HEENT: Normal  Neck: Supple without bruits or JVD. Lungs:  Resp regular and unlabored, CTA. Poor air movement at bases Heart: RRR no s3, s4, or murmurs. Abdomen: Soft, non-tender, non-distended, BS + x 4. Nephrostomy tubes Extremities: No clubbing, cyanosis or edema. DP/PT/Radials 2+ and equal bilaterally.  Labs    Troponin (Point of Care Test) No results for input(s): TROPIPOC in the last 72 hours.  Recent Labs  04/27/2015 1520 05/05/2015 2155  TROPONINI 0.40* 0.42*   Lab Results  Component Value Date   WBC 7.0 05/04/2015   HGB 10.3* 05/05/2015   HCT 31.6* 04/28/2015   MCV 102.9* 05/08/2015   PLT 273 05/17/2015    Recent Labs Lab 05/17/2015 1520  NA 137  K 4.4  CL 97*  CO2 19*  BUN 82*  CREATININE 11.61*  CALCIUM 7.7*  GLUCOSE  195*   Lab Results  Component Value Date   CHOL 130 12/13/2012   HDL 23* 12/13/2012   LDLCALC 83 12/13/2012   TRIG 122 12/13/2012   Lab Results  Component Value Date   DDIMER 0.28 11/20/2011     Radiology Studies    Ct Abdomen Pelvis Wo Contrast  05/02/2015  CLINICAL DATA:  Back pain starting after a bilateral nephrostomy tube exchange, decreased urine output and hematuria. Patient also reports chronic low back pain and shortness of breath with exertion. Patient missed dialysis twice this week with concern for associated fluid overload. EXAM: CT ABDOMEN AND PELVIS WITHOUT CONTRAST TECHNIQUE: Multidetector CT imaging of the abdomen and pelvis was performed following the standard protocol without IV contrast. COMPARISON:  CT abdomen pelvis dated 10/28/2014. FINDINGS: Lower chest: No acute findings. Prominent coronary artery calcifications noted at the heart base. Hepatobiliary: Status post cholecystectomy.  Liver is unremarkable. Pancreas: No mass or inflammatory process identified on this un-enhanced exam. Spleen: Upper normal in size. Adrenals/Urinary Tract: Bilateral nephrostomy tubes remain adequately positioned, although the left nephrostomy tube appears more peripherally positioned on today's study and there is increased hydroureter on the left which may indicate suboptimal location. Bladder is decompressed. Stomach/Bowel: Bowel is normal in caliber. No bowel wall thickening or evidence of bowel wall inflammation seen. Appendix is normal. Vascular/Lymphatic: Scattered atherosclerotic changes of the normal- aorta. No enlarged lymph nodes seen. Reproductive: No mass or other significant abnormality. Other: No free fluid or abscess collection identified. No free intraperitoneal air. Musculoskeletal: Degenerative changes throughout the thoracolumbar spine but no acute osseous abnormality. Superficial soft tissues are unremarkable. Left inguinal hernia again noted containing fat only. IMPRESSION: 1.  Bilateral nephrostomy tubes in place. Left nephrostomy tube is slightly more peripherally positioned on today's exam, at the posterior margin of the left renal pelvis. As there is increased hydroureter on the left, this suggests suboptimal location of the nephrostomy catheter possibly requiring repositioning. 2. Additional chronic/incidental findings detailed above. Electronically Signed   By: Roxy Horseman.D.  On: 05/23/2015 17:03   Dg Chest 2 View  05/07/2015  CLINICAL DATA:  End-stage renal disease, missed hemodialysis 3 days ago, hypertension, diabetes mellitus, coronary artery disease post MI, former smoker, bladder cancer, prior stroke EXAM: CHEST  2 VIEW COMPARISON:  07/21/2014 FINDINGS: Enlargement of cardiac silhouette. Mediastinal contours and pulmonary vascularity normal. Lungs clear. No pleural effusion or pneumothorax. Bones demineralized with endplate spur formation thoracic spine. IMPRESSION: Enlargement of cardiac silhouette. No acute abnormalities. Electronically Signed   By: Lavonia Dana M.D.   On: 04/30/2015 17:07    ECG & Cardiac Imaging    SR w/ first degree AVB, lateral ST depressions, no acute ST-TW changes. The lateral ST depression are new from prior  From Dr. Nelly Laurence note:  11/2012 Echo: LVEF 55-60%, mild LVH, no WMAs,   11/2012 Carotid US:  Summary: Right: mild to moderate mixed plaque origin and proximal ICA.Left: moderate mixed plaque origin ICA and moderate soft plaque proximal ICA. Bilateral: 0-39% ICA stenosis. Vertebral artery flow is antegrade.  05/22/13 Lexiscan MPI  Intermediate risk Lexiscan Cardiolite as outlined. There were no clearly diagnostic ST segment changes, no arrhythmias were noted. Moderate-sized inferolateral ischemic defect noted consistent with potential circumflex or RCA stenosis. LVEF 54% with normal LV volumes.  Cardiac Catheterization 1.16.2015  Procedural Findings:  Hemodynamics:  AO 143/74 mean 106 mm Hg  LV 144/24 mm  Hg  Coronary angiography:  Coronary dominance: right  Left mainstem: Mild tapering distal to 10-20%.  Left anterior descending (LAD): The proximal LAD is heavily calcified. There is diffuse 30-40% disease in the proximal vessel. There are multiple small diagonals without significant disease.  Left circumflex (LCx): The LCx gives off 2 OM branches that are moderate in size. The proximal LCx is heavily calcified with segmental 95% stenosis. The mid LCx and both OMs have mild disease diffusely to 30%.  Right coronary artery (RCA): The RCA appears to have stents from the proximal vessel to past the crux. There is diffuse calcification. There is diffuse 50% disease throughout the stented segment. There is a focal 90% stenosis at the crux that appears to be within the stent.  Left ventriculography: Not done.  Final Conclusions:  1. 2 vessel obstructive CAD. The culprit lesion appears to be the proximal LCx. The RCA has extensive stenting with moderate diffuse in stent disease and a focal area of severe stenosis at the crux.  Recommendations: Recommend hydration over the weekend with close monitoring of renal function. Given complexity of disease I would recommend staging PCI on Monday. Will load with Brilinta. I anticipate treating the LCx will require rotational atherectomy given the heavy calcification and I would recommend DES since he has complex lesion and CKD given high risk of restenosis. It may be best to treat the culprit vessel only then follow on medical therapy for the RCA disease.  _____________  Percutaneous Coronary Intervention 1.19.2015  Conclusions: Successful PCI of the LCx with a 3.0 x 15 mm Xience DES using adjunctive rotational atherectomy  Recommendations: ASA, Brilinta at least 12 months, aggressive med Rx for residual CAD.      Assessment & Plan    1. NSTEMI 2. HTN 3. HLD  The patient is a 65M with a history of CAD s/p PCI to LCx in 05/2013, HTN, HLD, Dm2,  ESRD on HD who presented to the [facility] ED today with generalized weakness x 1 week and back pain with chronic angina for the past month. In the ED his EKG shows lateral ST  depressions and his troponin is mildly elevated to 0.40, though he is ESRD on HD. He has known CAD and his CP may be due to his CAD. However, he has multiple comorbidities, namely his bladder cancer and recent hematuria, that would preclude cardiac catheterization and an aggressive revascularization strategy. At this point would pursue a conservative, ischemia-driven strategy. I recommend the following:  -serial biomarkers -continue ASA; defer clopidogrel for now -he is already started on heparin. Reasonable to continue but low threshold to stop if he has worsening hematuria -continue atorvastatin, can consider increasing dose to 80mg  if tolerated -continue imdur 60mg , can increase to 90mg  if he tolerates -continue ranexa -please obtain TTE in AM  Signed, Raliegh Ip, MD MPH 05/10/2015, 11:20 PM

## 2015-05-12 ENCOUNTER — Encounter (HOSPITAL_COMMUNITY): Payer: Self-pay | Admitting: Anesthesiology

## 2015-05-12 ENCOUNTER — Inpatient Hospital Stay (HOSPITAL_COMMUNITY): Payer: Medicare Other

## 2015-05-12 DIAGNOSIS — N1330 Unspecified hydronephrosis: Secondary | ICD-10-CM | POA: Diagnosis present

## 2015-05-12 DIAGNOSIS — I249 Acute ischemic heart disease, unspecified: Secondary | ICD-10-CM

## 2015-05-12 DIAGNOSIS — I209 Angina pectoris, unspecified: Secondary | ICD-10-CM

## 2015-05-12 LAB — GLUCOSE, CAPILLARY: Glucose-Capillary: 160 mg/dL — ABNORMAL HIGH (ref 65–99)

## 2015-05-12 LAB — TROPONIN I
TROPONIN I: 0.35 ng/mL — AB (ref <0.031)
TROPONIN I: 0.3 ng/mL — AB (ref <0.031)
TROPONIN I: 0.3 ng/mL — AB (ref <0.031)

## 2015-05-12 LAB — POCT I-STAT 3, ART BLOOD GAS (G3+)
ACID-BASE DEFICIT: 10 mmol/L — AB (ref 0.0–2.0)
ACID-BASE DEFICIT: 14 mmol/L — AB (ref 0.0–2.0)
BICARBONATE: 16.3 meq/L — AB (ref 20.0–24.0)
Bicarbonate: 19.3 mEq/L — ABNORMAL LOW (ref 20.0–24.0)
O2 SAT: 88 %
O2 SAT: 92 %
PO2 ART: 82 mmHg (ref 80.0–100.0)
Patient temperature: 98.5
TCO2: 18 mmol/L (ref 0–100)
TCO2: 21 mmol/L (ref 0–100)
pCO2 arterial: 54.6 mmHg — ABNORMAL HIGH (ref 35.0–45.0)
pCO2 arterial: 56.4 mmHg — ABNORMAL HIGH (ref 35.0–45.0)
pH, Arterial: 7.068 — CL (ref 7.350–7.450)
pH, Arterial: 7.156 — CL (ref 7.350–7.450)
pO2, Arterial: 77 mmHg — ABNORMAL LOW (ref 80.0–100.0)

## 2015-05-12 LAB — CBC
HCT: 29.2 % — ABNORMAL LOW (ref 39.0–52.0)
HEMATOCRIT: 31.2 % — AB (ref 39.0–52.0)
HEMATOCRIT: 35.9 % — AB (ref 39.0–52.0)
HEMOGLOBIN: 9.6 g/dL — AB (ref 13.0–17.0)
HEMOGLOBIN: 9.9 g/dL — AB (ref 13.0–17.0)
Hemoglobin: 11.1 g/dL — ABNORMAL LOW (ref 13.0–17.0)
MCH: 33.4 pg (ref 26.0–34.0)
MCH: 32.8 pg (ref 26.0–34.0)
MCH: 32.6 pg (ref 26.0–34.0)
MCHC: 32.9 g/dL (ref 30.0–36.0)
MCHC: 31.7 g/dL (ref 30.0–36.0)
MCHC: 30.9 g/dL (ref 30.0–36.0)
MCV: 101.7 fL — ABNORMAL HIGH (ref 78.0–100.0)
MCV: 103.3 fL — ABNORMAL HIGH (ref 78.0–100.0)
MCV: 105.6 fL — AB (ref 78.0–100.0)
PLATELETS: 223 10*3/uL (ref 150–400)
PLATELETS: 421 10*3/uL — AB (ref 150–400)
Platelets: 233 10*3/uL (ref 150–400)
RBC: 2.87 MIL/uL — ABNORMAL LOW (ref 4.22–5.81)
RBC: 3.02 MIL/uL — AB (ref 4.22–5.81)
RBC: 3.4 MIL/uL — AB (ref 4.22–5.81)
RDW: 14.7 % (ref 11.5–15.5)
RDW: 14.6 % (ref 11.5–15.5)
RDW: 14.8 % (ref 11.5–15.5)
WBC: 7.3 10*3/uL (ref 4.0–10.5)
WBC: 7.8 10*3/uL (ref 4.0–10.5)
WBC: 17.1 10*3/uL — AB (ref 4.0–10.5)

## 2015-05-12 LAB — COMPREHENSIVE METABOLIC PANEL
ALBUMIN: 2.8 g/dL — AB (ref 3.5–5.0)
ALK PHOS: 71 U/L (ref 38–126)
ALT: 54 U/L (ref 17–63)
ANION GAP: 22 — AB (ref 5–15)
AST: 119 U/L — ABNORMAL HIGH (ref 15–41)
BUN: 87 mg/dL — ABNORMAL HIGH (ref 6–20)
CHLORIDE: 99 mmol/L — AB (ref 101–111)
CO2: 16 mmol/L — AB (ref 22–32)
Calcium: 7.4 mg/dL — ABNORMAL LOW (ref 8.9–10.3)
Creatinine, Ser: 12.23 mg/dL — ABNORMAL HIGH (ref 0.61–1.24)
GFR calc non Af Amer: 3 mL/min — ABNORMAL LOW (ref >60)
GFR, EST AFRICAN AMERICAN: 4 mL/min — AB (ref >60)
GLUCOSE: 173 mg/dL — AB (ref 65–99)
Potassium: 4.6 mmol/L (ref 3.5–5.1)
SODIUM: 137 mmol/L (ref 135–145)
Total Bilirubin: 1 mg/dL (ref 0.3–1.2)
Total Protein: 6.4 g/dL — ABNORMAL LOW (ref 6.5–8.1)

## 2015-05-12 LAB — PROTIME-INR
INR: 1.2 (ref 0.00–1.49)
PROTHROMBIN TIME: 15.4 s — AB (ref 11.6–15.2)

## 2015-05-12 LAB — BASIC METABOLIC PANEL
ANION GAP: 19 — AB (ref 5–15)
BUN: 80 mg/dL — AB (ref 6–20)
CALCIUM: 7.4 mg/dL — AB (ref 8.9–10.3)
CO2: 17 mmol/L — AB (ref 22–32)
CREATININE: 11.89 mg/dL — AB (ref 0.61–1.24)
Chloride: 100 mmol/L — ABNORMAL LOW (ref 101–111)
GFR calc Af Amer: 4 mL/min — ABNORMAL LOW (ref >60)
GFR, EST NON AFRICAN AMERICAN: 3 mL/min — AB (ref >60)
GLUCOSE: 89 mg/dL (ref 65–99)
Potassium: 4.6 mmol/L (ref 3.5–5.1)
Sodium: 136 mmol/L (ref 135–145)

## 2015-05-12 LAB — HEPARIN LEVEL (UNFRACTIONATED): Heparin Unfractionated: <0.1 IU/mL — ABNORMAL LOW (ref 0.30–0.70)

## 2015-05-12 LAB — LACTIC ACID, PLASMA: Lactic Acid, Venous: 4.5 mmol/L (ref 0.5–2.0)

## 2015-05-12 LAB — URINE CULTURE

## 2015-05-12 LAB — APTT: aPTT: 35 seconds (ref 24–37)

## 2015-05-12 LAB — MAGNESIUM: Magnesium: 2 mg/dL (ref 1.7–2.4)

## 2015-05-12 MED ORDER — EPINEPHRINE HCL 0.1 MG/ML IJ SOSY
PREFILLED_SYRINGE | INTRAMUSCULAR | Status: AC
  Filled 2015-05-12: qty 50

## 2015-05-12 MED ORDER — ISOSORBIDE MONONITRATE ER 60 MG PO TB24: 90.0000 mg | ORAL_TABLET | Freq: Every day | ORAL | Status: DC

## 2015-05-12 MED ORDER — FENTANYL CITRATE (PF) 100 MCG/2ML IJ SOLN
INTRAMUSCULAR | Status: AC
  Administered 2015-05-12: 100 ug
  Filled 2015-05-12: qty 2

## 2015-05-12 MED ORDER — NOREPINEPHRINE BITARTRATE 1 MG/ML IV SOLN
0.0000 ug/min | INTRAVENOUS | Status: DC
  Filled 2015-05-12: qty 4

## 2015-05-12 MED ORDER — MIDAZOLAM HCL 2 MG/2ML IJ SOLN
INTRAMUSCULAR | Status: AC
  Filled 2015-05-12: qty 2

## 2015-05-12 MED ORDER — SODIUM BICARBONATE 8.4 % IV SOLN
INTRAVENOUS | Status: AC
  Filled 2015-05-12: qty 200

## 2015-05-12 MED FILL — Medication: Qty: 1 | Status: AC

## 2015-05-13 LAB — HEMOGLOBIN A1C
Hgb A1c MFr Bld: 8.7 % — ABNORMAL HIGH (ref 4.8–5.6)
Mean Plasma Glucose: 203 mg/dL

## 2015-05-26 NOTE — Procedures (Signed)
Limited EWcho Post arrest  1. Moderate to small pericardial effusion, without tamponade 2. RV fxn knetic 3. LV dilated but some kinetic, poor images to tell wall motion  Lavon Paganini. Titus Mould, MD, [place] Pgr: [place] [facility]

## 2015-05-26 NOTE — Consult Note (Signed)
Renal Service Consult Note [facility]  KEBRON SASSONE May 15, 2015 Roney Jaffe D Requesting Physician:  Dr Lyman Speller.  Reason for Consult:  ESRD pt with gen weak, CP and SOB  HPI: The patient is a 82 y.o. year-old with hx of CAD/ DES to LCX, ESRD on HD x 1 year, bladder Ca and bilat PCN tubes placed for nonfunctional bladder as result of multiple bladder Ca surgeries/ BCG treatments w bilat severe hydro. Has had PCN tubes since about mid-late 2015 it appears from chart at [facility]. PCN tubes are changed at [facility] regularly. HTN , HL also. Gets HD Dr Ishmael Holter on TTS schedule. Last week became ill with N/V, chills and aches on Wed. He was sick and missed Thurs and Sat dialysis. Presented to ED at [facility] on Sat with back pain, dec'd UOP, blood in urine (from PCN tubes), chronic low back pain and SOB. Tired and unable to ambulate well, falling at home. Also chest pain. W/U showed no acute MI, creat 11. Trop 0.40, some TWI on EKG. CT abdomen showed possible dislodged L PCN tube. Patient transferred to [facility] w dx of possible ACS. Started on IV heparin. Asked to see for dialysis.   Patient is up in the chair. Looks tired. Denies any SOB or CP.  No abd pain, n/v/d at thist time. No HA, blurred vision.    Past Medical History  Past Medical History  Diagnosis Date  . Dyslipidemia   . DJD (degenerative joint disease)   . Hypertension   . BPH (benign prostatic hyperplasia)   . Coronary artery disease     a. 1997 s/p MI and prior RCA stenting;  b. 04/2013 MV: inflat ischemia;  c. 05/2013 Cath/PCI: LM 10-20, LAD 30-40p, LCX 95p (3.0x15 Xience DES), 11m, OM1/2 30, RCA 50p diff ISR, 90d (med Rx).  . Bladder cancer ([place])   . Stroke ([place])   . Myocardial infarction ([place])   . Hydroureter     bilateral  . CKD (chronic kidney disease) stage 3, GFR 30-59 ml/min     dialysis  . Hydronephrosis     bilateral  . ESRD on hemodialysis ([place])     Tu, Th, Sa  . Chronic back pain   . Headache     Past Surgical History  Past Surgical History  Procedure Laterality Date  . Cholecystectomy    . Esophagogastroduodenoscopy  06/08/2002    Soft stricture of the GE  junction with erosive esophagitis. The stricture was dilated to 58 [place] using a Maloney dilator. Swollen fold at GE junction on the gastric site which was biopsied for histology and was suspicious for sentinel folds.  . Colonoscopy  06/08/2002    Small polyp was oblated via cold biopsy from the cecum and two were snared, one was at the rectosigmoid junction measuring about a centimeter, another one at the rectum which was smaller.  . Transurethral resection of bladder tumor      x7  . Coronary angioplasty  1997    3 stents  . Transurethral resection of bladder tumor  03/15/2012    Procedure: TRANSURETHRAL RESECTION OF BLADDER TUMOR (TURBT);  Surgeon: Marissa Nestle, MD;  Location: [facility];  Service: Urology;  Laterality: N/A;  . Cataract extraction w/phaco  06/20/2012    Procedure: CATARACT EXTRACTION PHACO AND INTRAOCULAR LENS PLACEMENT (IOC);  Surgeon: Tonny Branch, MD;  Location: [facility];  Service: Ophthalmology;  Laterality: Right;  CDE=16.59  . Cataract extraction w/phaco Left 07/04/2012    Procedure:  CATARACT EXTRACTION PHACO AND INTRAOCULAR LENS PLACEMENT (IOC);  Surgeon: Tonny Branch, MD;  Location: [facility];  Service: Ophthalmology;  Laterality: Left;  CDE: 24.33  . Transurethral resection of bladder tumor N/A 09/29/2012    Procedure: TRANSURETHRAL RESECTION OF BLADDER TUMOR (TURBT);  Surgeon: Marissa Nestle, MD;  Location: [facility];  Service: Urology;  Laterality: N/A;  . Multiple tooth extractions    . Cystoscopy with biopsy N/A 04/27/2013    Procedure: CYSTOSCOPY WITH BLADDER BIOPSY & FULGURATION;  Surgeon: Marissa Nestle, MD;  Location: [facility];  Service: Urology;  Laterality: N/A;  . Left heart catheterization with coronary angiogram N/A 06/09/2013    Procedure: LEFT HEART CATHETERIZATION WITH CORONARY ANGIOGRAM;   Surgeon: Peter M Martinique, MD;  Location: [facility] CATH LAB;  Service: Cardiovascular;  Laterality: N/A;  . Percutaneous coronary rotoblator intervention (pci-r) N/A 06/12/2013    Procedure: PERCUTANEOUS CORONARY ROTOBLATOR INTERVENTION (PCI-R);  Surgeon: Blane Ohara, MD;  Location: [facility] CATH LAB;  Service: Cardiovascular;  Laterality: N/A;  . Tonsillectomy    . Av fistula placement Right 09/05/2014    Procedure: BRACHIOCEPHALIC ARTERIOVENOUS (AV) FISTULA CREATION RIGHT ARM;  Surgeon: Mal Misty, MD;  Location: [facility] OR;  Service: Vascular;  Laterality: Right;   Family History  Family History  Problem Relation Age of Onset  . Stroke Mother 22  . Diabetes Brother 68  . Heart disease Father   . Heart attack Father    Social History  reports that he quit smoking about 34 years ago. His smoking use included Cigarettes. He started smoking about 64 years ago. He has a 10 pack-year smoking history. He has never used smokeless tobacco. He reports that he does not drink alcohol or use illicit drugs. Allergies  Allergies  Allergen Reactions  . Celecoxib Swelling    Bodily Swelling  . Hydrocodone-Acetaminophen Itching  . Morphine Itching  . Niacin Itching  . Other     sevelamer   . Piroxicam Itching  . Ciprofloxacin Rash   Home medications Prior to Admission medications   Medication Sig Start Date End Date Taking? Authorizing Provider  aspirin EC 81 MG tablet Take 81 mg by mouth daily.   Yes Historical Provider, MD  atorvastatin (LIPITOR) 20 MG tablet Take 20 mg by mouth daily.   Yes Historical Provider, MD  ezetimibe (ZETIA) 10 MG tablet Take 1 tablet (10 mg total) by mouth every morning. 05/01/15  Yes Arnoldo Lenis, MD  FOSRENOL 500 MG chewable tablet Chew 3 tablets by mouth 3 (three) times daily with meals. 04/11/15  Yes Historical Provider, MD  gemfibrozil (LOPID) 600 MG tablet Take 600 mg by mouth 2 (two) times daily before a meal.   Yes Historical Provider, MD  isosorbide mononitrate  (IMDUR) 60 MG 24 hr tablet Take 1 tablet (60 mg total) by mouth daily. 05/01/15  Yes Arnoldo Lenis, MD  metoprolol tartrate (LOPRESSOR) 25 MG tablet TAKE 1/2 TABLET BY MOUTH TWICE DAILY. 05/01/15  Yes Arnoldo Lenis, MD  nitroGLYCERIN (NITROSTAT) 0.4 MG SL tablet Place 1 tablet (0.4 mg total) under the tongue every 5 (five) minutes x 3 doses as needed for chest pain. Chest Pain 05/10/15  Yes Arnoldo Lenis, MD  omeprazole (PRILOSEC) 20 MG capsule Take 20 mg by mouth at bedtime.    Yes Historical Provider, MD  ranolazine (RANEXA) 500 MG 12 hr tablet Take 1 tablet (500 mg total) by mouth 2 (two) times daily. 05/01/15  Yes Arnoldo Lenis, MD  sodium chloride (OCEAN) 0.65 % SOLN nasal spray Place 1 spray into both nostrils as needed for congestion.   Yes Historical Provider, MD  tamsulosin (FLOMAX) 0.4 MG CAPS capsule Take 1 capsule by mouth daily. 04/22/15  Yes Historical Provider, MD   Liver Function Tests No results for input(s): AST, ALT, ALKPHOS, BILITOT, PROT, ALBUMIN in the last 168 hours. No results for input(s): LIPASE, AMYLASE in the last 168 hours. CBC  Recent Labs Lab 05/02/2015 1520 May 24, 2015 0330 05/24/2015 0855  WBC 7.0 7.3 7.8  NEUTROABS 5.6  --   --   HGB 10.3* 9.6* 9.9*  HCT 31.6* 29.2* 31.2*  MCV 102.9* 101.7* 103.3*  PLT 273 223 0000000   Basic Metabolic Panel  Recent Labs Lab 05/22/2015 1520 05-24-15 0330  NA 137 136  K 4.4 4.6  CL 97* 100*  CO2 19* 17*  GLUCOSE 195* 89  BUN 82* 80*  CREATININE 11.61* 11.89*  CALCIUM 7.7* 7.4*    Filed Vitals:   2015-05-24 0800 05/24/15 1000 05-24-15 1214 05/24/15 1700  BP: 117/48  115/65 94/70  Pulse: 83 84 83 73  Temp: 98.5 F (36.9 C)  98.5 F (36.9 C) 98.5 F (36.9 C)  TempSrc: Oral  Oral Oral  Resp: 20 21 20 20   Height:      Weight:      SpO2: 95% 96% 97% 96%   Exam WDWN obese WM no distress, looks tired and worn out, no acute distress No rash, cyanosis or gangrene Sclera anicteric, throat clear Neck veins  flat Chest clear bilat to bases RRR no sig M, no RG abd obese protuberant, nontender, +BS, bilat PCN tubes posteriorly GU normal male MS no joint effusion Ext no LE or UE edema RUA AVF +bruit Neuro +asterixis, Ox 3 oterhwise nf  TTS at [place] AVF  CXR 12/17 CM no edema/infiltrates  Assessment: 1 Chest pain/ +trop^ mild/ EKG changes- r/o ACS, per prim/ cards. Hx multiple stents/ MI. Takes imdur/ ranexa/ statin/ asa/ zetia/ BB at home.  2 ESRD usual HD TTS - missed HD x 2, appears uremic w asterixis, sluggish 3 Bladder cancer w nonfunctional bladder after mult Ca surgeries/BCG rx's- sp bilat PCN 4 HTN takes metop only 5 Hx CVA  6 Anemia Hb 9.9 this am 7 MBD cont fosrenol , get records 8 Hematuria - CT shows possibly dislodged PCN 9 Volume no vol excess, get dry wt   Plan - HD tonight and prob again tomorrow, see orders.   Kelly Splinter MD [employer] pager [phone]    cell [phone] 05-24-15, 5:01 PM

## 2015-05-26 NOTE — Procedures (Signed)
Coded ACLS followed resus to ST, aline , line placed api started on top levo Treated empiric hyperkalemia  Lavon Paganini. Titus Mould, MD, [place] Pgr: [place] [facility]

## 2015-05-26 NOTE — Procedures (Signed)
3rd arrest acls followed [place], brady arrythmia resus to RBBB tachy then brady arrythmia  Lavon Paganini. Titus Mould, MD, [place] Pgr: [facility]

## 2015-05-26 NOTE — Progress Notes (Signed)
ANTICOAGULATION CONSULT NOTE - Follow Up Consult  Pharmacy Consult for heparin Indication: chest pain/ACS  Allergies  Allergen Reactions  . Celecoxib Swelling    Bodily Swelling  . Hydrocodone-Acetaminophen Itching  . Morphine Itching  . Niacin Itching  . Other     sevelamer   . Piroxicam Itching  . Ciprofloxacin Rash    Patient Measurements: Height: 5\' 8"  (172.7 cm) Weight: 228 lb 2.8 oz (103.5 kg) IBW/kg (Calculated) : 68.4 Heparin Dosing Weight: 90kg  Vital Signs: Temp: 98.5 F (36.9 C) (12/18 0800) Temp Source: Oral (12/18 0800) BP: 117/48 mmHg (12/18 0800) Pulse Rate: 83 (12/18 0800)  Labs:  Recent Labs  04/30/2015 1520 05/23/2015 2155 2015-05-31 0330 05-31-2015 0855  HGB 10.3*  --  9.6* 9.9*  HCT 31.6*  --  29.2* 31.2*  PLT 273  --  223 233  HEPARINUNFRC  --   --   --  <0.10*  CREATININE 11.61*  --  11.89*  --   TROPONINI 0.40* 0.42* 0.35* 0.30*    Estimated Creatinine Clearance: 5.9 mL/min (by C-G formula based on Cr of 11.89).   Medications:  Heparin @ 950 units/hr  Assessment: 82 y/o male with ESRD who presented to the [facility] ED with back pain and SOB with exertion transferred to [facility] for evaluation. Pharmacy consulted to begin IV heparin for ACS.   Patient with hematuria in nephrostomy bag, per RN, not worsening from this morning. First heparin level undetectable. Hgb 9.9, plts 233- stable.  Goal of Therapy:  Heparin level 0.3-0.7 units/ml Monitor platelets by anticoagulation protocol: Yes   Plan:  -increase heparin to 1100 units/hr (~2unit/kg/hr increase)- do not want to bolus as this time given hematuria -HL in 8h -daily HL and CBC -follow cardiology plans for management  Aeris Hersman D. Chloe Baig, PharmD, BCPS Clinical Pharmacist Pager: [phone] 05/31/15 11:08 AM

## 2015-05-26 NOTE — Procedures (Signed)
Central Venous Catheter Insertion Procedure Note Craig Castillo [id number] May 08, 1933  Procedure: Insertion of Central Venous Catheter Indications: Drug and/or fluid administration  Procedure Details Consent: Unable to obtain consent because of emergent medical necessity. Time Out: Verified patient identification, verified procedure, site/side was marked, verified correct patient position, special equipment/implants available, medications/allergies/relevent history reviewed, required imaging and test results available.  Performed  Maximum sterile technique was used including antiseptics, cap, gloves, gown, hand hygiene, mask and sheet. Skin prep: Chlorhexidine; local anesthetic administered A antimicrobial bonded/coated triple lumen catheter was placed in the right femoral vein due to emergent situation using the Seldinger technique.  Evaluation Blood flow good Complications: No apparent complications Patient did tolerate procedure well. Chest X-ray ordered to verify placement.  CXR: normal.  Craig Castillo. 05/22/2015, 6:27 PM  During code  Craig Castillo. Titus Mould, MD, [place] Pgr: [place] [facility]

## 2015-05-26 NOTE — Evaluation (Signed)
Physical Therapy Evaluation Patient Details Name: Craig Castillo MRN: [id number] DOB: 11-Feb-1933 Today's Date: 19-May-2015   History of Present Illness  82 year old male with past medical history of CAD, end-stage renal disease and lateral cancer with secondary bilateral hydronephrosis status post nephrostomy tubes who presented to [facility] on 12/17 with complaints of generalized weakness and chest pain. The patient normally gets his dialysis on Tuesday, Thursday and Saturday and missed his dialysis sessions on 12/15 and 17 due to weakness. In the emergency room, patient noted to have mildly elevated troponin and 0.4, EKG with ST depression and T-wave inversions in inferior and lateral leads which were new compared to few months prior and a CT scan of the abdomen noting increased hydroureter on the left suggesting suboptimal location of nephrostomy catheter. Due to concerns for ACS, patient transferred to [facility]. Seen by cardiology who feel he had a non-STEMI and plan to try to manage him medically.  Clinical Impression   Pt admitted with above diagnosis. Pt currently with functional limitations due to the deficits listed below (see PT Problem List).  Pt will benefit from skilled PT to increase their independence and safety with mobility to allow discharge to the venue listed below.       Follow Up Recommendations CIR    Equipment Recommendations  Rolling walker with 5" wheels;3in1 (PT)    Recommendations for Other Services OT consult;Rehab consult     Precautions / Restrictions Precautions Precautions: Fall Precaution Comments: bil nephrostomy tubes      Mobility  Bed Mobility Overal bed mobility: Needs Assistance Bed Mobility: Supine to Sit     Supine to sit: Mod assist     General bed mobility comments: Mod handheld assist to pull to sit  Transfers Overall transfer level: Needs assistance Equipment used: 1 person hand held assist Transfers:  Stand Pivot Transfers   Stand pivot transfers: Mod assist       General transfer comment: Cues for technique; mod assist to steady while taking 2-3 small steps bed to chair; very weak and fatigued quickly  Ambulation/Gait                Stairs            Wheelchair Mobility    Modified Rankin (Stroke Patients Only)       Balance                                             Pertinent Vitals/Pain Pain Assessment: No/denies pain    Home Living Family/patient expects to be discharged to:: Private residence Living Arrangements: Spouse/significant other Available Help at Discharge: Family;Available 24 hours/day Type of Home: House Home Access: Stairs to enter Entrance Stairs-Rails: Right Entrance Stairs-Number of Steps: 3 Home Layout: One level Home Equipment:  (to be determined)      Prior Function Level of Independence: Independent with assistive device(s)         Comments: This will need to be verifed     Hand Dominance        Extremity/Trunk Assessment   Upper Extremity Assessment: Generalized weakness           Lower Extremity Assessment: Generalized weakness         Communication   Communication: No difficulties  Cognition Arousal/Alertness: Awake/alert Behavior During Therapy: WFL for tasks assessed/performed Overall Cognitive Status: No family/caregiver  present to determine baseline cognitive functioning                      General Comments General comments (skin integrity, edema, etc.): VSS on Room Air; no chest pain reported    Exercises        Assessment/Plan    PT Assessment Patient needs continued PT services  PT Diagnosis Difficulty walking;Generalized weakness   PT Problem List Decreased strength;Decreased activity tolerance;Decreased balance;Decreased mobility;Decreased coordination;Decreased cognition;Decreased knowledge of use of DME;Decreased safety awareness;Cardiopulmonary status  limiting activity;Decreased knowledge of precautions  PT Treatment Interventions DME instruction;Gait training;Stair training;Functional mobility training;Therapeutic activities;Therapeutic exercise;Patient/family education   PT Goals (Current goals can be found in the Care Plan section) Acute Rehab PT Goals Patient Stated Goal: did not state; agreeable to OOB today, and agreeable to Rehab PT Goal Formulation: With patient Time For Goal Achievement: 05/26/15 Potential to Achieve Goals: Good    Frequency Min 3X/week   Barriers to discharge        Co-evaluation               End of Session Equipment Utilized During Treatment: Gait belt Activity Tolerance: Patient tolerated treatment well Patient left: in chair;with call bell/phone within reach;with chair alarm set Nurse Communication: Mobility status         Time: [id number] PT Time Calculation (min) (ACUTE ONLY): 18 min   Charges:   PT Evaluation $Initial PT Evaluation Tier I: 1 Procedure     PT G CodesQuin Hoop 06/04/15, 5:22 PM  Roney Marion, [place] Pager [phone] Office [phone]

## 2015-05-26 NOTE — Progress Notes (Signed)
Code completed; RN x 2/MD auscultation with absence of heart sounds or spontaneous respirations; primary MD Maryland Pink talking c pt spouse on phone; chaplain paged; will continue to monitor

## 2015-05-26 NOTE — Progress Notes (Signed)
Pt sitting in chair low; RN x 2 at chair to pull pt up; pt states "I cant breathe" with respirations rapidly subsuding after statement; code called; compressions; bag mask began; see code sheet documentation for further charting; family paged by charge RN and aware of pt's status

## 2015-05-26 NOTE — Progress Notes (Signed)
Patient delivered to morgue with one ring on finger of left hand.

## 2015-05-26 NOTE — Progress Notes (Signed)
Patient ID: Craig Castillo, male   DOB: 09-26-1932, 82 y.o.   MRN: [id number]  Responded to arrest See procedures notes acls x 3 Treated empiric hyperkalemia Add epi, line, aline Multiple arrests totally over 1 hr since start Unable to resus last time Pronounced dead Dr Maryland Pink at bedside to update family Craig Castillo. Titus Mould, MD, [place] Pgr: [place] [facility]

## 2015-05-26 NOTE — Progress Notes (Signed)
SUBJECTIVE:  No chest pain.  Confused.     PHYSICAL EXAM Filed Vitals:   05/27/15 0402 05/27/15 0407 05-27-15 0800 05-27-2015 1000  BP: 104/59  117/48   Pulse: 78  83 84  Temp: 98.7 F (37.1 C)  98.5 F (36.9 C)   TempSrc: Oral  Oral   Resp: 18  20 21   Height:      Weight:  228 lb 2.8 oz (103.5 kg)    SpO2: 97%  95% 96%   General:  No distress Lungs:  Clear Heart:  RRR Abdomen:  Positive bowel sounds, no rebound no guarding Extremities:  No edema   LABS: Lab Results  Component Value Date   TROPONINI 0.30* 05-27-2015   Results for orders placed or performed during the hospital encounter of 05/24/2015 (from the past 24 hour(s))  Urinalysis, Routine w reflex microscopic (not at [facility])     Status: Abnormal   Collection Time: 05/09/2015  3:08 PM  Result Value Ref Range   Color, Urine RED (A) YELLOW   APPearance CLOUDY (A) CLEAR   Specific Gravity, Urine 1.025 1.005 - 1.030   pH 5.5 5.0 - 8.0   Glucose, UA NEGATIVE NEGATIVE mg/dL   Hgb urine dipstick LARGE (A) NEGATIVE   Bilirubin Urine MODERATE (A) NEGATIVE   Ketones, ur TRACE (A) NEGATIVE mg/dL   Protein, ur >300 (A) NEGATIVE mg/dL   Nitrite POSITIVE (A) NEGATIVE   Leukocytes, UA MODERATE (A) NEGATIVE  Urine culture     Status: None   Collection Time: 05/04/2015  3:08 PM  Result Value Ref Range   Specimen Description URINE, CLEAN CATCH    Special Requests NONE    Culture      MULTIPLE SPECIES PRESENT, SUGGEST RECOLLECTION Performed at [facility]    Report Status 05-27-2015 FINAL   Urine microscopic-add on     Status: Abnormal   Collection Time: 05/10/2015  3:08 PM  Result Value Ref Range   Squamous Epithelial / LPF NONE SEEN NONE SEEN   WBC, UA 0-5 0 - 5 WBC/hpf   RBC / HPF 0-5 0 - 5 RBC/hpf   Bacteria, UA FEW (A) NONE SEEN   Urine-Other YEAST PRESENT   Basic metabolic panel     Status: Abnormal   Collection Time: 05/20/2015  3:20 PM  Result Value Ref Range   Sodium 137 135 - 145 mmol/L   Potassium 4.4  3.5 - 5.1 mmol/L   Chloride 97 (L) 101 - 111 mmol/L   CO2 19 (L) 22 - 32 mmol/L   Glucose, Bld 195 (H) 65 - 99 mg/dL   BUN 82 (H) 6 - 20 mg/dL   Creatinine, Ser 11.61 (H) 0.61 - 1.24 mg/dL   Calcium 7.7 (L) 8.9 - 10.3 mg/dL   GFR calc non Af Amer 4 (L) >60 mL/min   GFR calc Af Amer 4 (L) >60 mL/min   Anion gap 21 (H) 5 - 15  CBC with Differential     Status: Abnormal   Collection Time: 04/26/2015  3:20 PM  Result Value Ref Range   WBC 7.0 4.0 - 10.5 K/uL   RBC 3.07 (L) 4.22 - 5.81 MIL/uL   Hemoglobin 10.3 (L) 13.0 - 17.0 g/dL   HCT 31.6 (L) 39.0 - 52.0 %   MCV 102.9 (H) 78.0 - 100.0 fL   MCH 33.6 26.0 - 34.0 pg   MCHC 32.6 30.0 - 36.0 g/dL   RDW 14.5 11.5 - 15.5 %   Platelets  273 150 - 400 K/uL   Neutrophils Relative % 80 %   Neutro Abs 5.6 1.7 - 7.7 K/uL   Lymphocytes Relative 11 %   Lymphs Abs 0.8 0.7 - 4.0 K/uL   Monocytes Relative 5 %   Monocytes Absolute 0.4 0.1 - 1.0 K/uL   Eosinophils Relative 4 %   Eosinophils Absolute 0.3 0.0 - 0.7 K/uL   Basophils Relative 0 %   Basophils Absolute 0.0 0.0 - 0.1 K/uL  Lactic acid, plasma     Status: None   Collection Time: 05/21/2015  3:20 PM  Result Value Ref Range   Lactic Acid, Venous 1.2 0.5 - 2.0 mmol/L  Troponin I     Status: Abnormal   Collection Time: 05/13/2015  3:20 PM  Result Value Ref Range   Troponin I 0.40 (H) <0.031 ng/mL  Brain natriuretic peptide     Status: Abnormal   Collection Time: 05/13/2015  3:20 PM  Result Value Ref Range   B Natriuretic Peptide 1129.0 (H) 0.0 - 100.0 pg/mL  Lactic acid, plasma     Status: None   Collection Time: 05/21/2015  6:08 PM  Result Value Ref Range   Lactic Acid, Venous 1.2 0.5 - 2.0 mmol/L  MRSA PCR Screening     Status: None   Collection Time: 05/16/2015  9:25 PM  Result Value Ref Range   MRSA by PCR NEGATIVE NEGATIVE  Troponin I     Status: Abnormal   Collection Time: 05/07/2015  9:55 PM  Result Value Ref Range   Troponin I 0.42 (H) <0.031 ng/mL  TSH     Status: None   Collection  Time: 04/25/2015  9:55 PM  Result Value Ref Range   TSH 1.686 0.350 - 4.500 uIU/mL  Troponin I     Status: Abnormal   Collection Time: 2015-06-05  3:30 AM  Result Value Ref Range   Troponin I 0.35 (H) <0.031 ng/mL  Basic metabolic panel     Status: Abnormal   Collection Time: June 05, 2015  3:30 AM  Result Value Ref Range   Sodium 136 135 - 145 mmol/L   Potassium 4.6 3.5 - 5.1 mmol/L   Chloride 100 (L) 101 - 111 mmol/L   CO2 17 (L) 22 - 32 mmol/L   Glucose, Bld 89 65 - 99 mg/dL   BUN 80 (H) 6 - 20 mg/dL   Creatinine, Ser 11.89 (H) 0.61 - 1.24 mg/dL   Calcium 7.4 (L) 8.9 - 10.3 mg/dL   GFR calc non Af Amer 3 (L) >60 mL/min   GFR calc Af Amer 4 (L) >60 mL/min   Anion gap 19 (H) 5 - 15  CBC     Status: Abnormal   Collection Time: 2015/06/05  3:30 AM  Result Value Ref Range   WBC 7.3 4.0 - 10.5 K/uL   RBC 2.87 (L) 4.22 - 5.81 MIL/uL   Hemoglobin 9.6 (L) 13.0 - 17.0 g/dL   HCT 29.2 (L) 39.0 - 52.0 %   MCV 101.7 (H) 78.0 - 100.0 fL   MCH 33.4 26.0 - 34.0 pg   MCHC 32.9 30.0 - 36.0 g/dL   RDW 14.7 11.5 - 15.5 %   Platelets 223 150 - 400 K/uL  Troponin I     Status: Abnormal   Collection Time: 2015-06-05  8:55 AM  Result Value Ref Range   Troponin I 0.30 (H) <0.031 ng/mL  Heparin level (unfractionated)     Status: Abnormal   Collection Time: 06/05/2015  8:55 AM  Result Value Ref Range   Heparin Unfractionated <0.10 (L) 0.30 - 0.70 IU/mL  CBC     Status: Abnormal   Collection Time: 2015/06/01  8:55 AM  Result Value Ref Range   WBC 7.8 4.0 - 10.5 K/uL   RBC 3.02 (L) 4.22 - 5.81 MIL/uL   Hemoglobin 9.9 (L) 13.0 - 17.0 g/dL   HCT 31.2 (L) 39.0 - 52.0 %   MCV 103.3 (H) 78.0 - 100.0 fL   MCH 32.8 26.0 - 34.0 pg   MCHC 31.7 30.0 - 36.0 g/dL   RDW 14.6 11.5 - 15.5 %   Platelets 233 150 - 400 K/uL    Intake/Output Summary (Last 24 hours) at 06/01/15 1219 Last data filed at 06/01/2015 1200  Gross per 24 hour  Intake 721.19 ml  Output    355 ml  Net 366.19 ml     ASSESSMENT AND  PLAN:  CHEST PAIN:  Medical management with his significant comorbid conditions.   He has hematuria on heparin so we can stop this.  Continue ASA.  Not on Plavix.    Troponin trend is flat.  I will increase the Imdur.    Jeneen Rinks [facility] 06-01-2015 12:19 PM

## 2015-05-26 NOTE — Progress Notes (Signed)
PROGRESS NOTE  THOR BRENN [id number] DOB: Jul 26, 1932 DOA: 05/08/2015 PCP: Glo Herring., MD  HPI/Recap of past 62 hours: 82 year old male with past medical history of CAD, end-stage renal disease and lateral cancer with secondary bilateral hydronephrosis status post nephrostomy tubes who presented to [facility] on 12/17 with complaints of generalized weakness and chest pain. The patient normally gets his dialysis on Tuesday, Thursday and Saturday and missed his dialysis sessions on 12/15 and 17 due to weakness. In the emergency room, patient noted to have mildly elevated troponin and 0.4, EKG with ST depression and T-wave inversions in inferior and lateral leads which were new compared to few months prior and a CT scan of the abdomen noting increased hydroureter on the left suggesting suboptimal location of nephrostomy catheter. Due to concerns for ACS, patient transferred to [facility].  Seen by cardiology who feel he had a non-STEMI and plan to try to manage him medically. Nephrology consulted.  Patient doing okay. No complaints and is hungry.  Patient start having increased hematuria, concern given that he is on heparin with history of bladder cancer.  Assessment/Plan:     Malignant neoplasm of bladder ([place]) with secondary bilateral hydronephrosis now with hematuria: Discussed with cardiology about stopping heparin. Given findings of increasing hydronephrosis, we'll discuss with urology although they may favor holding off on repositioning until cardiology issues are better stabilized.   HLD (hyperlipidemia): As below, trying to titrate up statin   Essential hypertension: As below. Managing.   ESRD on hemodialysis [facility]): Nephrology consulted    Generalized weakness: Likely secondary to non-STEMI plus volume overload   Elevated troponin   ACS (acute coronary syndrome) (HCC)/non-STEMI: Appreciate cardiology help. Medically managing. On aspirin, holding Plavix.  Started on heparin with hematuria, we'll need to stop. Adjusting statin and nitrates. Plan is for TTE  hematuria  Code Status: Full code   Family Communication: Left message for family   Disposition Plan: Continue in stepdown until better stabilized    Consultants:  Nephrology  Urology  Cardiology   Procedures:  Plan TTE   Antibiotics:  None    Objective: BP 117/48 mmHg  Pulse 83  Temp(Src) 98.5 F (36.9 C) (Oral)  Resp 20  Ht 5\' 8"  (1.727 m)  Wt 103.5 kg (228 lb 2.8 oz)  BMI 34.70 kg/m2  SpO2 95%  Intake/Output Summary (Last 24 hours) at June 11, 2015 1155 Last data filed at 11-Jun-2015 0400  Gross per 24 hour  Intake 436.19 ml  Output      0 ml  Net 436.19 ml   Filed Weights   05/18/2015 1448 June 11, 2015 0407  Weight: 99.791 kg (220 lb) 103.5 kg (228 lb 2.8 oz)    Exam:   General:  Slightly confused, alert and oriented 2, no acute distress   Cardiovascular: Regular rate and rhythm, [id number], 2/6 systolic ejection murmur   Respiratory: Clear to auscultation bilaterally   Abdomen: Soft, nontender, nondistended, positive bowel sounds   Musculoskeletal: No clubbing or cyanosis, 1+ pitting edema from the knees down bilaterally    Data Reviewed: Basic Metabolic Panel:  Recent Labs Lab 05/06/2015 1520 2015-06-11 0330  NA 137 136  K 4.4 4.6  CL 97* 100*  CO2 19* 17*  GLUCOSE 195* 89  BUN 82* 80*  CREATININE 11.61* 11.89*  CALCIUM 7.7* 7.4*   Liver Function Tests: No results for input(s): AST, ALT, ALKPHOS, BILITOT, PROT, ALBUMIN in the last 168 hours. No results for input(s): LIPASE, AMYLASE in the last 168  hours. No results for input(s): AMMONIA in the last 168 hours. CBC:  Recent Labs Lab 05/03/2015 1520 04-Jun-2015 0330 2015-06-04 0855  WBC 7.0 7.3 7.8  NEUTROABS 5.6  --   --   HGB 10.3* 9.6* 9.9*  HCT 31.6* 29.2* 31.2*  MCV 102.9* 101.7* 103.3*  PLT 273 223 233   Cardiac Enzymes:    Recent Labs Lab 05/17/2015 1520 05/05/2015 2155 Jun 04, 2015 0330  2015-06-04 0855  TROPONINI 0.40* 0.42* 0.35* 0.30*   BNP (last 3 results)  Recent Labs  07/21/14 1348 04/27/2015 1520  BNP 142.0* 1129.0*    ProBNP (last 3 results) No results for input(s): PROBNP in the last 8760 hours.  CBG: No results for input(s): GLUCAP in the last 168 hours.  Recent Results (from the past 240 hour(s))  MRSA PCR Screening     Status: None   Collection Time: 05/04/2015  9:25 PM  Result Value Ref Range Status   MRSA by PCR NEGATIVE NEGATIVE Final    Comment:        The GeneXpert MRSA Assay (FDA approved for NASAL specimens only), is one component of a comprehensive MRSA colonization surveillance program. It is not intended to diagnose MRSA infection nor to guide or monitor treatment for MRSA infections.      Studies: Ct Abdomen Pelvis Wo Contrast  05/20/2015  CLINICAL DATA:  Back pain starting after a bilateral nephrostomy tube exchange, decreased urine output and hematuria. Patient also reports chronic low back pain and shortness of breath with exertion. Patient missed dialysis twice this week with concern for associated fluid overload. EXAM: CT ABDOMEN AND PELVIS WITHOUT CONTRAST TECHNIQUE: Multidetector CT imaging of the abdomen and pelvis was performed following the standard protocol without IV contrast. COMPARISON:  CT abdomen pelvis dated 10/28/2014. FINDINGS: Lower chest: No acute findings. Prominent coronary artery calcifications noted at the heart base. Hepatobiliary: Status post cholecystectomy.  Liver is unremarkable. Pancreas: No mass or inflammatory process identified on this un-enhanced exam. Spleen: Upper normal in size. Adrenals/Urinary Tract: Bilateral nephrostomy tubes remain adequately positioned, although the left nephrostomy tube appears more peripherally positioned on today's study and there is increased hydroureter on the left which may indicate suboptimal location. Bladder is decompressed. Stomach/Bowel: Bowel is normal in caliber. No  bowel wall thickening or evidence of bowel wall inflammation seen. Appendix is normal. Vascular/Lymphatic: Scattered atherosclerotic changes of the normal- aorta. No enlarged lymph nodes seen. Reproductive: No mass or other significant abnormality. Other: No free fluid or abscess collection identified. No free intraperitoneal air. Musculoskeletal: Degenerative changes throughout the thoracolumbar spine but no acute osseous abnormality. Superficial soft tissues are unremarkable. Left inguinal hernia again noted containing fat only. IMPRESSION: 1. Bilateral nephrostomy tubes in place. Left nephrostomy tube is slightly more peripherally positioned on today's exam, at the posterior margin of the left renal pelvis. As there is increased hydroureter on the left, this suggests suboptimal location of the nephrostomy catheter possibly requiring repositioning. 2. Additional chronic/incidental findings detailed above. Electronically Signed   By: Franki Cabot M.D.   On: 05/10/2015 17:03   Dg Chest 2 View  05/10/2015  CLINICAL DATA:  End-stage renal disease, missed hemodialysis 3 days ago, hypertension, diabetes mellitus, coronary artery disease post MI, former smoker, bladder cancer, prior stroke EXAM: CHEST  2 VIEW COMPARISON:  07/21/2014 FINDINGS: Enlargement of cardiac silhouette. Mediastinal contours and pulmonary vascularity normal. Lungs clear. No pleural effusion or pneumothorax. Bones demineralized with endplate spur formation thoracic spine. IMPRESSION: Enlargement of cardiac silhouette. No acute abnormalities. Electronically  Signed   By: Lavonia Dana M.D.   On: 04/26/2015 17:07    Scheduled Meds: . aspirin EC  81 mg Oral Daily  . atorvastatin  20 mg Oral Daily  . ezetimibe  10 mg Oral q morning - 10a  . gemfibrozil  600 mg Oral BID AC  . isosorbide mononitrate  60 mg Oral Daily  . lanthanum  1,500 mg Oral TID WC  . metoprolol tartrate  12.5 mg Oral BID  . pantoprazole  40 mg Oral QPM  . ranolazine  500  mg Oral BID  . sodium chloride  3 mL Intravenous Q12H  . tamsulosin  0.4 mg Oral QHS    Continuous Infusions: . heparin 950 Units/hr (05/21/2015 2303)     Time spent: 25 minutes   [place] [facility] Pager [phone] . If 7PM-7AM, please contact night-coverage at [email or web address], password [facility] May 15, 2015, 11:55 AM  LOS: 1 day

## 2015-05-26 NOTE — Procedures (Signed)
Arterial Catheter Insertion Procedure Note Craig Castillo [id number] 20-Apr-1933  Procedure: Insertion of Arterial Catheter  Indications: Blood pressure monitoring, Frequent blood sampling and code  Procedure Details Consent: Unable to obtain consent because of emergent medical necessity. Time Out: Verified patient identification, verified procedure, site/side was marked, verified correct patient position, special equipment/implants available, medications/allergies/relevent history reviewed, required imaging and test results available.  Performed  Maximum sterile technique was used including antiseptics, cap, gloves, gown, hand hygiene, mask and sheet. Skin prep: Chlorhexidine; local anesthetic administered 20 gauge catheter was inserted into right femoral artery using the Seldinger technique.  Evaluation Blood flow good; BP tracing good. Complications: No apparent complications.   Craig Castillo. 03-Jun-2015   During code Emergent Catheter stopped at about 10 cm in , did not push furtehr, stenosis? Good wave form Area may have been contaminated during code, re cleaned  Craig Castillo. Titus Mould, MD, [place] Pgr: [facility]

## 2015-05-26 NOTE — Discharge Summary (Signed)
Death Summary  Craig Castillo [id number] DOB: 04-12-1933 DOA: 05/29/2015  PCP: Glo Herring., MD PCP/Office notified:   Admit date: 05/29/15 Date of Death: 05/30/2015  Final Diagnoses:  Cause of death felt to be secondary to pulseless electrical activity in the setting of patient with non-STEMI/acute coronary syndrome and end-stage renal disease after missing several dialysis sessions   Chest pain Active Problems:   Malignant neoplasm of bladder (HCC)   HLD (hyperlipidemia)   Essential hypertension   ESRD on hemodialysis (HCC)   Abnormal EKG   Generalized weakness   Elevated troponin   ACS (acute coronary syndrome) (HCC)   Bilateral hydronephrosis   History of present illness:  82 year old male with past medical history of CAD, end-stage renal disease and lateral cancer with secondary bilateral hydronephrosis status post nephrostomy tubes who presented to [facility] on May 29, 2023 with complaints of generalized weakness and chest pain. The patient normally gets his dialysis on Tuesday, Thursday and Saturday and missed his dialysis sessions on 12/15 and 17 due to weakness. In the emergency room, patient noted to have mildly elevated troponin and 0.4, EKG with ST depression and T-wave inversions in inferior and lateral leads which were new compared to few months prior and a CT scan of the abdomen noting increased hydroureter on the left suggesting suboptimal location of nephrostomy catheter. Due to concerns for ACS, patient transferred to [facility] ([place]) Course:  Seen by cardiology who feel he had a non-STEMI and plan to try to manage him medically. Patient put on heparin and holding Plavix at this time Nephrology consulted. Patient started having severe gross hematuria later that day, we discussed with cardiology about stopping his heparin as well as given findings of increasing hydronephrosis, plan was to discuss with urology about repositioning tubes however  likely they would favor doing this only after cardiology issues were stable. Plan was for TTE. That evening, patient became to cardiac and then coated. Code team notified. Critical care on site. Patient able to be resuscitated after 10 minutes and intubated. Central line placed. Initially in pulseless electrical activity. However, before patient could be transferred to ICU, repeat coated and was resuscitated 2 more times before fourth coating session. At this point despite attempts to, patient was unable to be resuscitated and he was pronounced at approximately 1810.  Family was notified.   Signed:  Annita Brod  [facility] 05/30/15, 6:35 PM

## 2015-05-26 NOTE — Procedures (Signed)
Intubation Procedure Note Craig Castillo [id number] 1933-02-19  Procedure: Intubation Indications: Respiratory insufficiency  Procedure Details Consent: Unable to obtain consent because of emergent medical necessity. Time Out: Verified patient identification, verified procedure, site/side was marked, verified correct patient position, special equipment/implants available, medications/allergies/relevent history reviewed, required imaging and test results available.  Performed  Maximum sterile technique was used including gloves and hand hygiene.  MAC and 3    Evaluation Hemodynamic Status: Transient hypertension requiring treatment; O2 sats: transiently fell during during procedure Patient's Current Condition: unstable Complications: Complications of cardiac arrest Patient did not tolerate procedure well. Chest X-ray ordered to verify placement.  CXR: pending.   Craig Castillo June 07, 2015

## 2015-05-26 NOTE — Progress Notes (Signed)
Spouse/additional family arrived; family at bedside with pt body; questions encouraged and answered; ETT, IV sites, central line, and nephrostomy tubes  remains in place; chaplain at bedside with family; will continue to closely monitor

## 2015-05-26 NOTE — Progress Notes (Signed)
CDS called per protocol; family questions encouraged and answered; full report given to [facility] RN charge 7pm-7am

## 2015-05-26 DEATH — deceased

## 2015-06-10 ENCOUNTER — Ambulatory Visit: Payer: Medicare Other | Admitting: Cardiology

## 2016-07-17 IMAGING — CT CT HEAD W/O CM
4 of 5 series · 14 of 47 positions shown, 15 images · non-contrast
Comparison: 12/13/2012

CLINICAL DATA: Trauma, neck pain, fall, syncope

EXAM:
CT HEAD WITHOUT CONTRAST
CT CERVICAL SPINE WITHOUT CONTRAST
TECHNIQUE: Multidetector CT imaging of the head and cervical spine was
performed following the standard protocol without intravenous
contrast. Multiplanar CT image reconstructions of the cervical spine
were also generated.

[Series 2: headseq 4.8 h37s · axial · 0.47mm/px · z∈[+278,+368]mm · 3 of 36 slices shown, 4 images]
[im 9/36  brain]
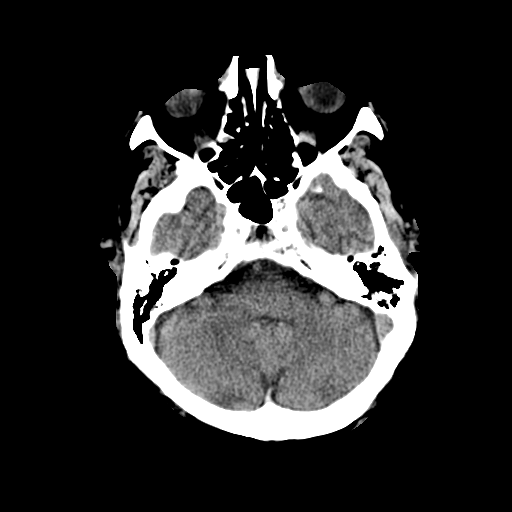
[im 9/36  bone]
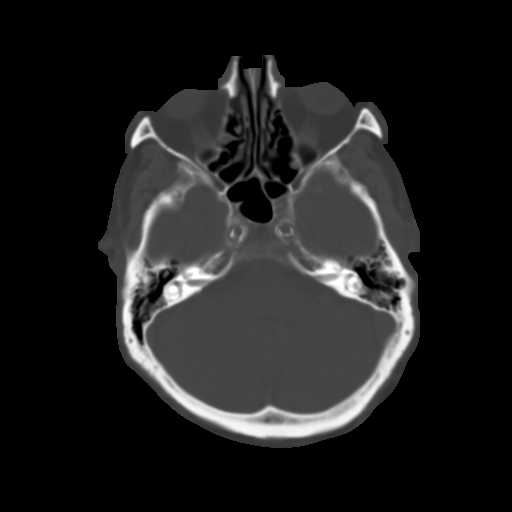
[im 18/36  brain]
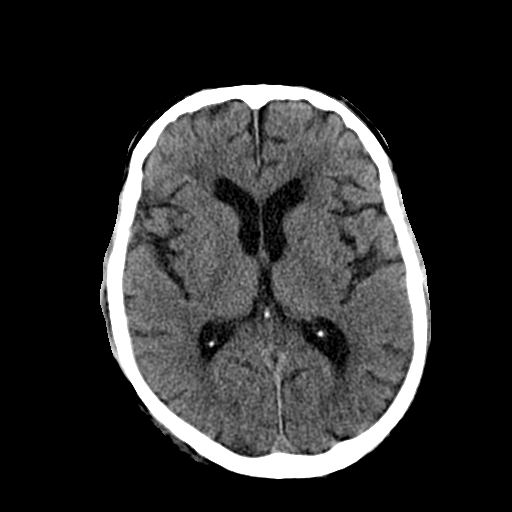
[im 27/36  brain]
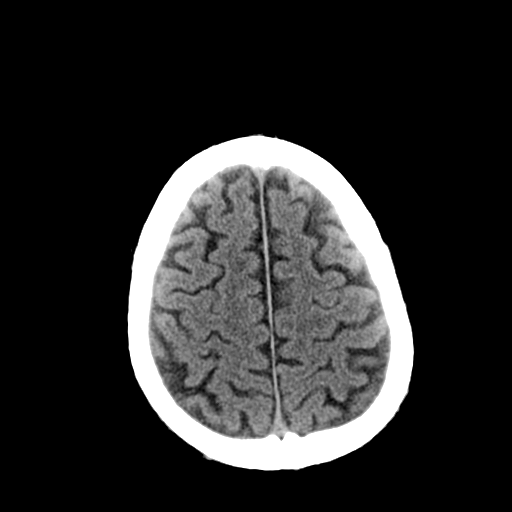

[Series 7: sagittal bone 2.0 · sagittal · 0.25mm/px · 3 of 45 slices shown]
[im 15/45  brain]
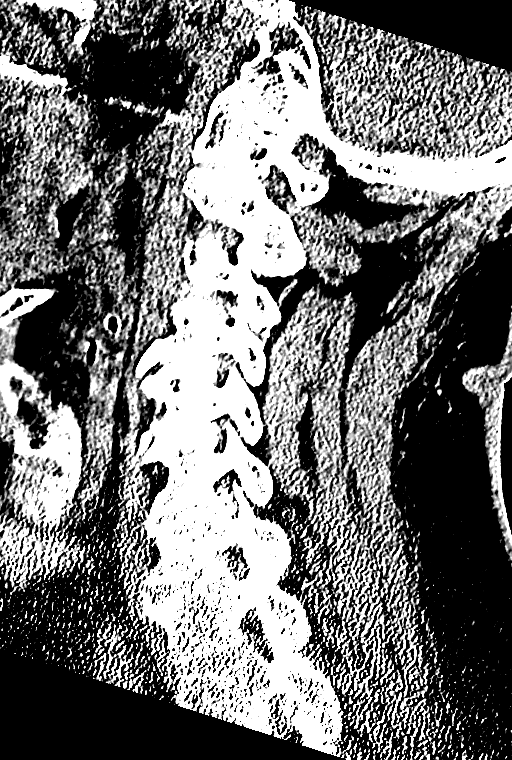
[im 23/45  brain]
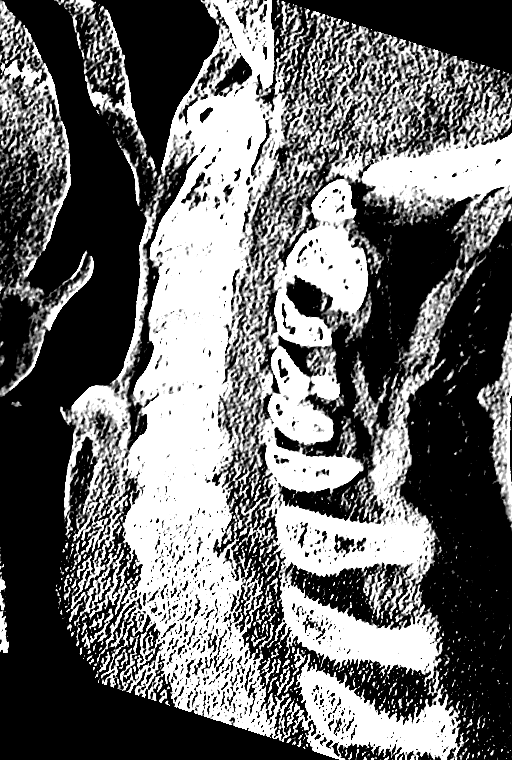
[im 30/45  brain]
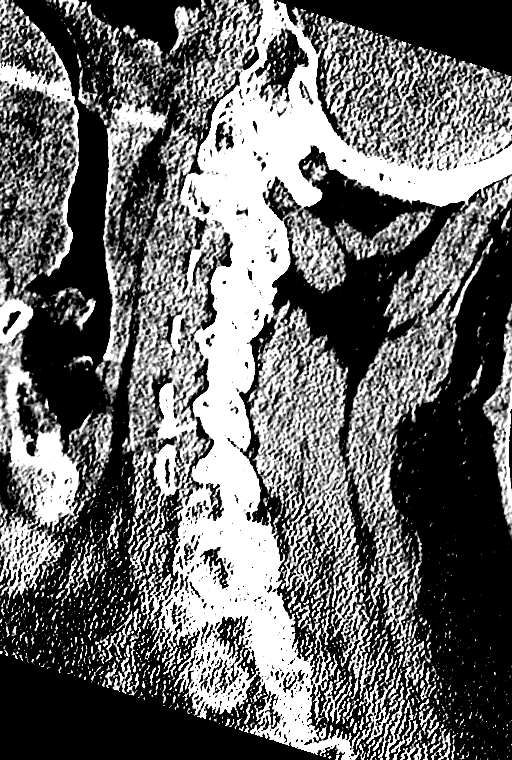

[Series 8: coronal bone 2.0 · coronal · 0.21mm/px · 3 of 54 slices shown]
[im 18/54  brain]
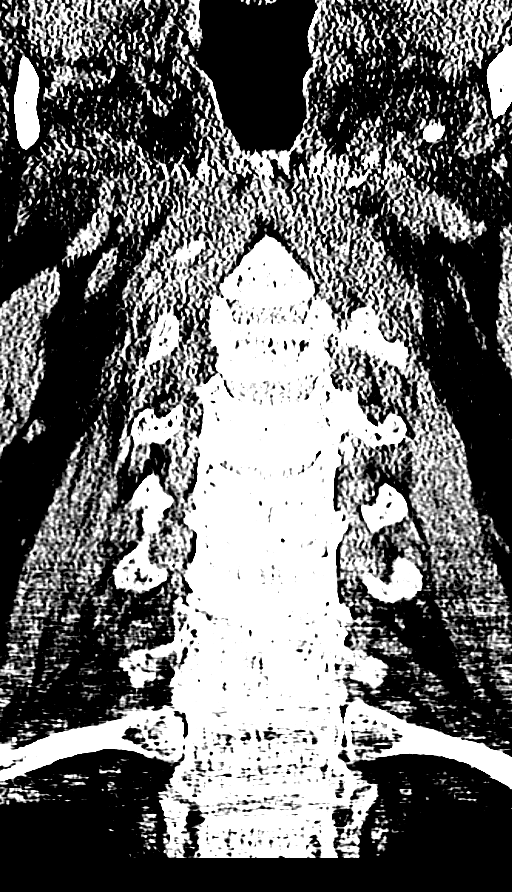
[im 24/54  brain]
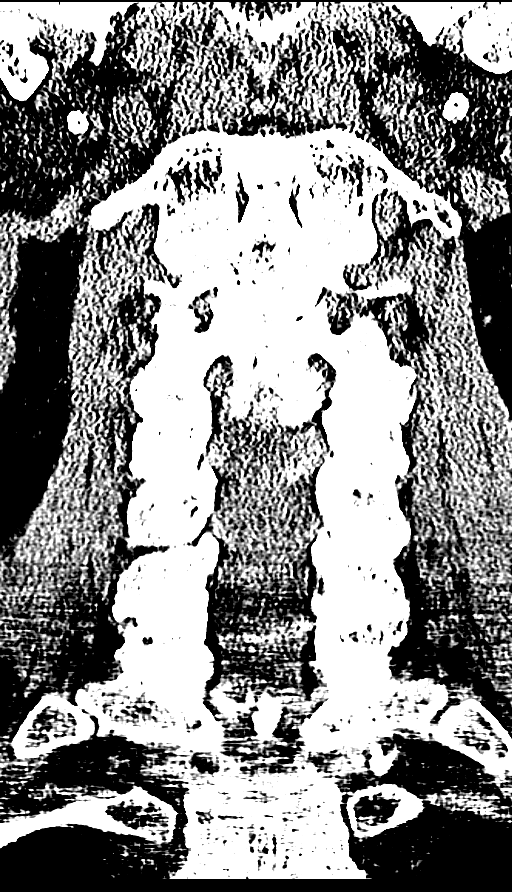
[im 30/54  brain]
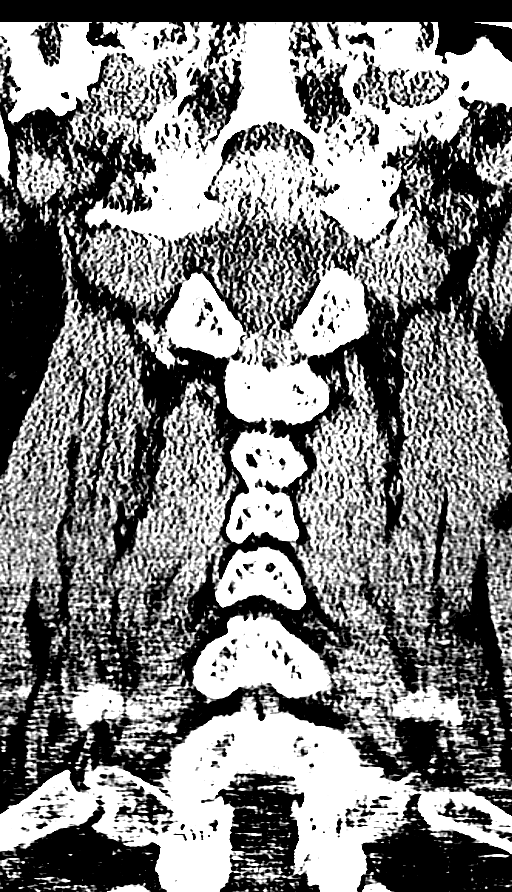

[Series 9: axial bone 2.0 · axial · 0.24mm/px · z∈[+54,+142]mm · 5 of 92 slices shown]
[im 8/92  bone]
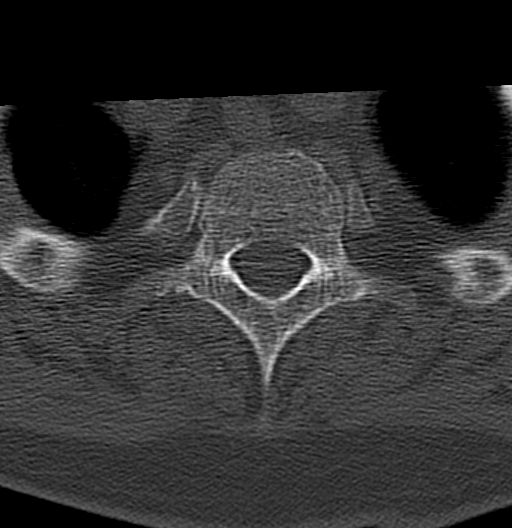
[im 23/92  bone]
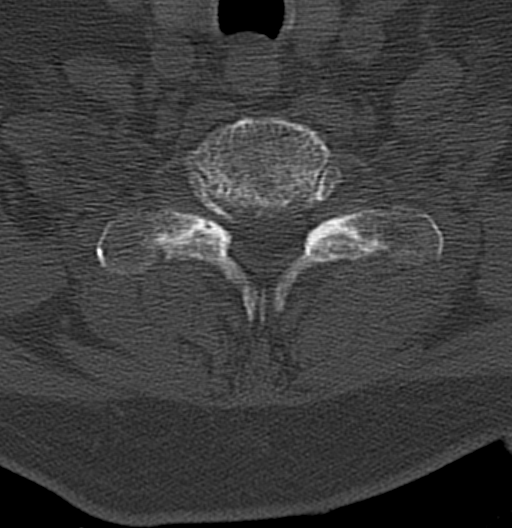
[im 31/92  bone]
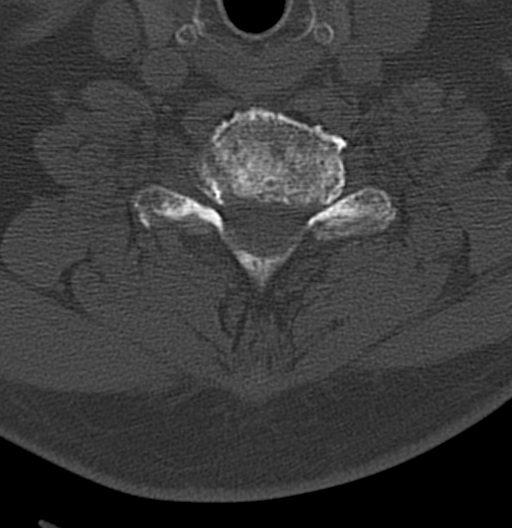
[im 38/92  bone]
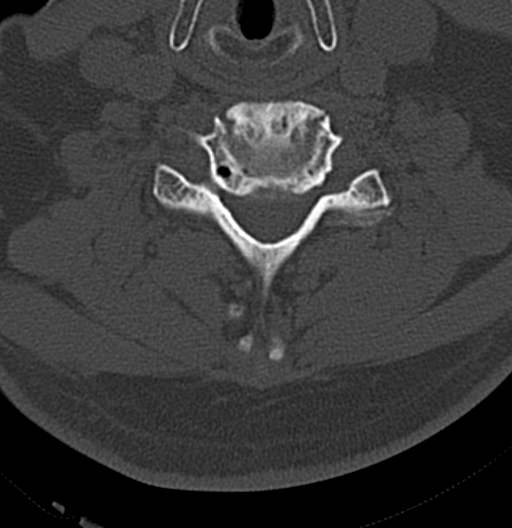
[im 54/92  bone]
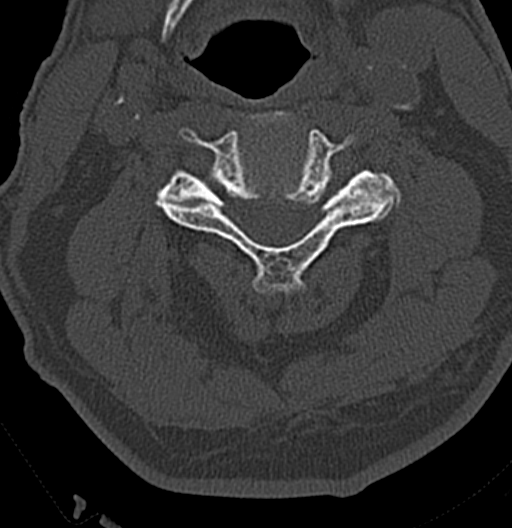

[14 of 47 positions shown; findings below may reference images not displayed]

FINDINGS: CT HEAD FINDINGS

No skull fracture is noted. Mild scalp swelling and subcutaneous
stranding in right posterior parietal region.

No mass effect or midline shift. There is small focus of hemorrhage
along the interhemispheric fissure axial image 23 measures 5 x 9 mm.
Additional tiny hemorrhage along the interhemispheric fissure
measures 3 mm axial image 24.

No intraventricular hemorrhage. Stable atrophy and chronic white
matter disease.

CT CERVICAL SPINE FINDINGS

Axial images of the cervical spine shows no acute fracture or
subluxation. Atherosclerotic calcifications of vertebral arteries
are noted. Atherosclerotic calcifications of carotid bifurcation
bilaterally. There is no pneumothorax in visualized lung apices.

Computer processed images shows degenerative changes C1-C2
articulation. There is disc space flattening with mild anterior and
mild posterior spurring at C4-C5 level. Disc space flattening with
mild anterior and mild posterior spurring at C5-C6 and C6-C7 level.
Mild disc space flattening with posterior spurring at C7-T1 level.
No prevertebral soft tissue swelling. Cervical airway is patent.
IMPRESSION: 1. No skull fracture is noted. There are tiny foci of acute
hemorrhage along the interhemispheric fissure measures 9 x 5 mm
axial image 22 and 3 mm axial image 24. No mass effect or midline
shift. Stable atrophy and chronic white matter disease. No
intraventricular hemorrhage.
2. No cervical spine acute fracture or subluxation. Degenerative
changes as described above. No prevertebral soft tissue swelling.
Cervical airway is patent. These results were called by telephone at
the time of interpretation on 03/02/2014 at [DATE] to Dr. JIM
CHAI , who verbally acknowledged these results.

## 2016-12-05 IMAGING — CR DG CHEST 1V PORT
1 series · 1 of 1 positions shown · non-contrast
Comparison: 06/08/2013

CLINICAL DATA: Tremors

EXAM:
PORTABLE CHEST - 1 VIEW

[portable]
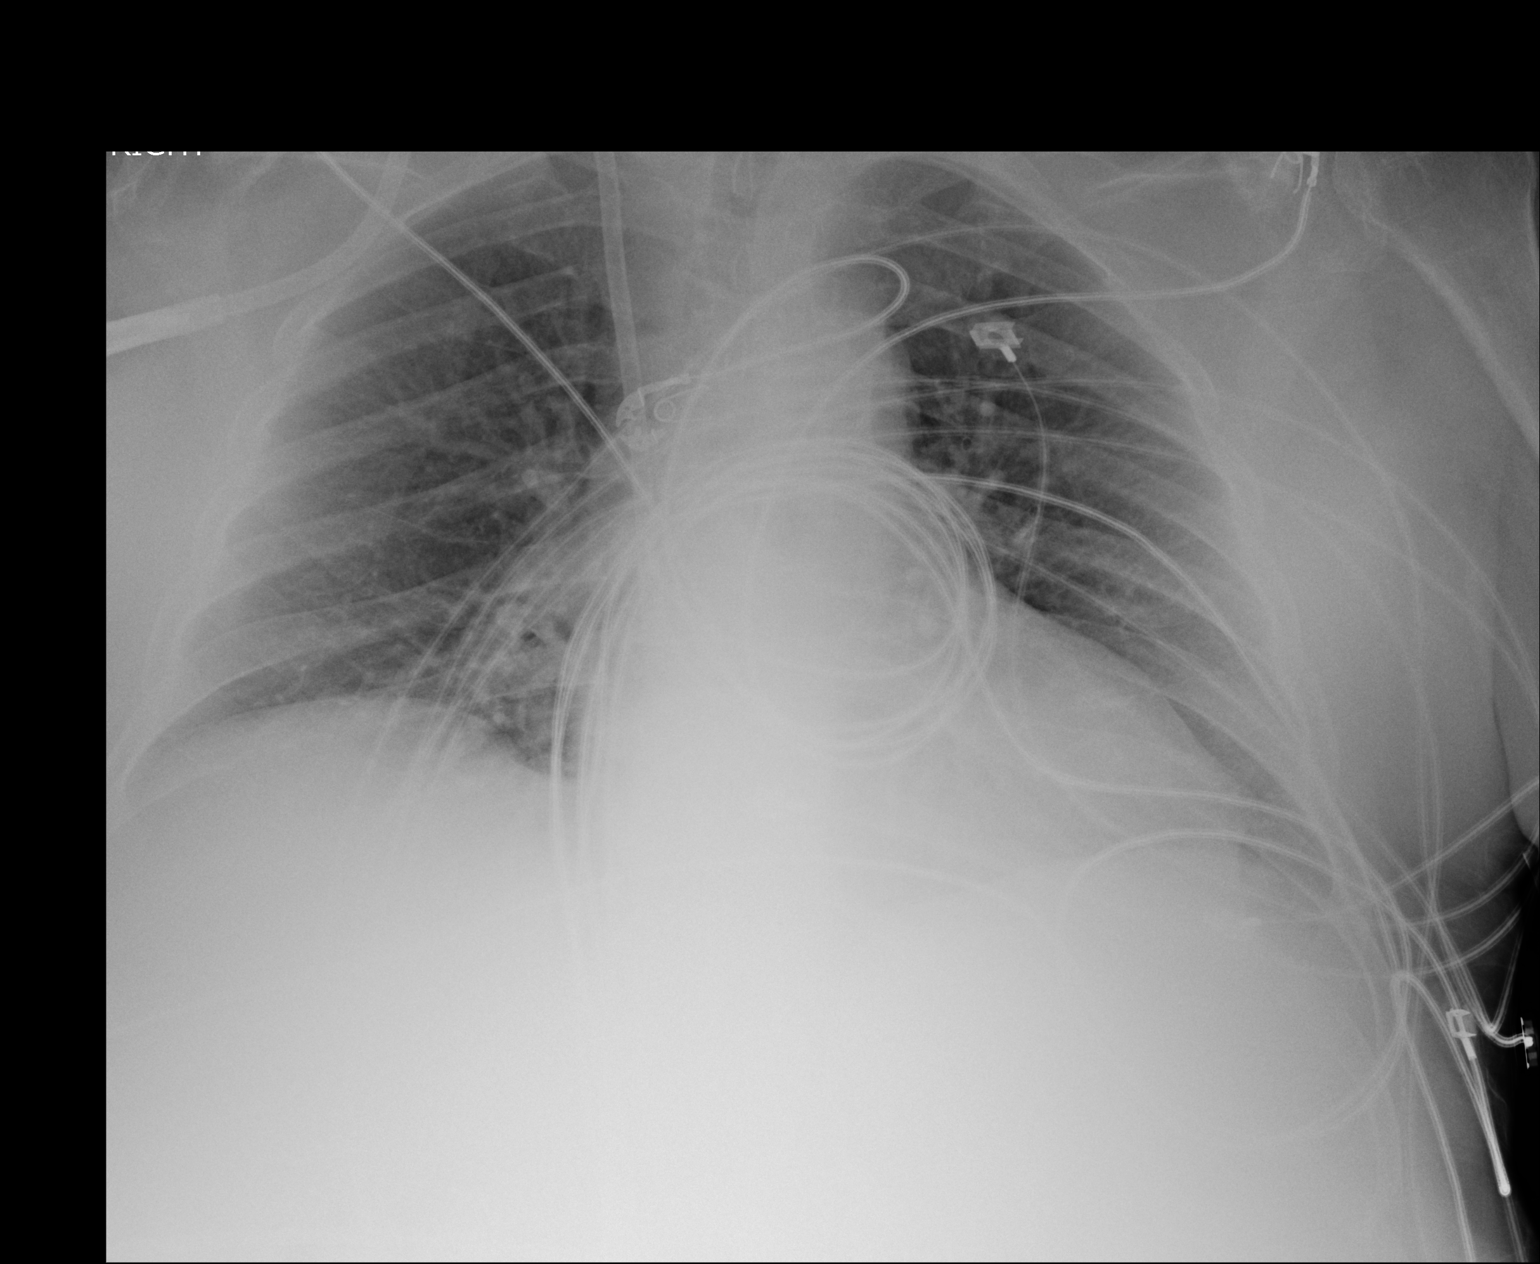

[1 of 1 positions shown; findings below may reference images not displayed]

FINDINGS: Normal heart size. Low lung volumes. Bibasilar atelectasis. Upper
lungs clear. Normal vascularity. Right internal jugular dialysis
catheter in place with its tip at the cavoatrial junction.
IMPRESSION: Bibasilar atelectasis.
